# Patient Record
Sex: Male | Born: 1955
Health system: Southern US, Community
[De-identification: ages and names within clinical notes are randomized; demographics above are authoritative.]

## PROBLEM LIST (undated history)

## (undated) DIAGNOSIS — R7303 Prediabetes: Secondary | ICD-10-CM

## (undated) DIAGNOSIS — H4911 Fourth [trochlear] nerve palsy, right eye: Secondary | ICD-10-CM

## (undated) DIAGNOSIS — E7800 Pure hypercholesterolemia, unspecified: Secondary | ICD-10-CM

## (undated) DIAGNOSIS — E785 Hyperlipidemia, unspecified: Secondary | ICD-10-CM

## (undated) DIAGNOSIS — I6529 Occlusion and stenosis of unspecified carotid artery: Secondary | ICD-10-CM

## (undated) DIAGNOSIS — I639 Cerebral infarction, unspecified: Secondary | ICD-10-CM

## (undated) DIAGNOSIS — I10 Essential (primary) hypertension: Secondary | ICD-10-CM

## (undated) DIAGNOSIS — I2510 Atherosclerotic heart disease of native coronary artery without angina pectoris: Secondary | ICD-10-CM

## (undated) HISTORY — DX: Occlusion and stenosis of unspecified carotid artery: I65.29

## (undated) HISTORY — DX: Atherosclerotic heart disease of native coronary artery without angina pectoris: I25.10

## (undated) HISTORY — PX: CORONARY ARTERY BYPASS GRAFT: SHX141

## (undated) HISTORY — PX: COLONOSCOPY W/ POLYPECTOMY: SHX1380

## (undated) HISTORY — DX: Essential (primary) hypertension: I10

## (undated) HISTORY — DX: Fourth (trochlear) nerve palsy, right eye: H49.11

## (undated) HISTORY — DX: Hyperlipidemia, unspecified: E78.5

## (undated) HISTORY — PX: CAROTID ENDARTERECTOMY: SUR193

## (undated) HISTORY — PX: UPPER GASTROINTESTINAL ENDOSCOPY: SHX188

## (undated) HISTORY — PX: OTHER SURGICAL HISTORY: SHX169

## (undated) HISTORY — PX: CARDIAC CATHETERIZATION: SHX172

---

## 1998-11-03 ENCOUNTER — Encounter: Payer: Self-pay | Admitting: Cardiovascular Disease

## 1998-11-07 ENCOUNTER — Encounter: Payer: Self-pay | Admitting: Cardiovascular Disease

## 1998-11-16 ENCOUNTER — Encounter: Payer: Self-pay | Admitting: Cardiovascular Disease

## 2004-12-03 ENCOUNTER — Other Ambulatory Visit: Payer: Self-pay

## 2004-12-03 ENCOUNTER — Emergency Department: Payer: Self-pay | Admitting: Emergency Medicine

## 2005-09-25 ENCOUNTER — Ambulatory Visit: Payer: Self-pay | Admitting: Internal Medicine

## 2005-12-10 ENCOUNTER — Encounter: Payer: Self-pay | Admitting: Orthopedic Surgery

## 2005-12-16 ENCOUNTER — Encounter: Payer: Self-pay | Admitting: Orthopedic Surgery

## 2006-01-16 ENCOUNTER — Encounter: Payer: Self-pay | Admitting: Orthopedic Surgery

## 2006-02-16 ENCOUNTER — Encounter: Payer: Self-pay | Admitting: Orthopedic Surgery

## 2007-02-02 ENCOUNTER — Emergency Department: Payer: Self-pay | Admitting: Emergency Medicine

## 2007-07-24 ENCOUNTER — Ambulatory Visit: Payer: Self-pay | Admitting: Family Medicine

## 2007-09-23 ENCOUNTER — Ambulatory Visit: Payer: Self-pay | Admitting: Family Medicine

## 2008-01-16 ENCOUNTER — Ambulatory Visit: Payer: Self-pay | Admitting: Family Medicine

## 2008-01-26 ENCOUNTER — Ambulatory Visit: Payer: Self-pay | Admitting: Internal Medicine

## 2008-01-26 ENCOUNTER — Ambulatory Visit: Payer: Self-pay | Admitting: *Deleted

## 2008-02-11 ENCOUNTER — Ambulatory Visit: Payer: Self-pay | Admitting: Family Medicine

## 2008-03-09 ENCOUNTER — Ambulatory Visit: Payer: Self-pay | Admitting: Family Medicine

## 2008-03-16 ENCOUNTER — Ambulatory Visit: Payer: Self-pay | Admitting: Family Medicine

## 2008-07-06 ENCOUNTER — Ambulatory Visit: Payer: Self-pay | Admitting: Family Medicine

## 2008-10-05 ENCOUNTER — Ambulatory Visit: Payer: Self-pay | Admitting: Family Medicine

## 2008-11-10 ENCOUNTER — Ambulatory Visit: Payer: Self-pay | Admitting: Family Medicine

## 2008-11-13 ENCOUNTER — Emergency Department (HOSPITAL_COMMUNITY): Admission: EM | Admit: 2008-11-13 | Discharge: 2008-11-13 | Payer: Self-pay | Admitting: Emergency Medicine

## 2008-12-08 ENCOUNTER — Ambulatory Visit: Payer: Self-pay | Admitting: Internal Medicine

## 2008-12-12 ENCOUNTER — Emergency Department (HOSPITAL_COMMUNITY): Admission: EM | Admit: 2008-12-12 | Discharge: 2008-12-12 | Payer: Self-pay | Admitting: Emergency Medicine

## 2009-01-13 ENCOUNTER — Ambulatory Visit: Payer: Self-pay | Admitting: Internal Medicine

## 2009-03-22 ENCOUNTER — Ambulatory Visit: Payer: Self-pay | Admitting: Internal Medicine

## 2009-03-22 LAB — CONVERTED CEMR LAB
Cholesterol: 177 mg/dL (ref 0–200)
Total CHOL/HDL Ratio: 3.6
Triglycerides: 140 mg/dL (ref <150)

## 2009-04-21 ENCOUNTER — Ambulatory Visit: Payer: Self-pay | Admitting: Internal Medicine

## 2009-05-19 ENCOUNTER — Ambulatory Visit: Payer: Self-pay | Admitting: Internal Medicine

## 2009-06-07 ENCOUNTER — Ambulatory Visit: Payer: Self-pay | Admitting: Internal Medicine

## 2009-06-07 LAB — CONVERTED CEMR LAB: Microalb, Ur: 0.5 mg/dL (ref 0.00–1.89)

## 2009-06-13 ENCOUNTER — Encounter: Payer: Self-pay | Admitting: Cardiovascular Disease

## 2009-06-30 DIAGNOSIS — R079 Chest pain, unspecified: Secondary | ICD-10-CM | POA: Insufficient documentation

## 2009-07-12 ENCOUNTER — Ambulatory Visit: Payer: Self-pay | Admitting: Cardiovascular Disease

## 2009-07-12 DIAGNOSIS — I25810 Atherosclerosis of coronary artery bypass graft(s) without angina pectoris: Secondary | ICD-10-CM

## 2009-07-12 DIAGNOSIS — E785 Hyperlipidemia, unspecified: Secondary | ICD-10-CM | POA: Insufficient documentation

## 2009-07-12 DIAGNOSIS — I10 Essential (primary) hypertension: Secondary | ICD-10-CM

## 2009-07-12 DIAGNOSIS — I2510 Atherosclerotic heart disease of native coronary artery without angina pectoris: Secondary | ICD-10-CM | POA: Insufficient documentation

## 2009-07-12 DIAGNOSIS — I25118 Atherosclerotic heart disease of native coronary artery with other forms of angina pectoris: Secondary | ICD-10-CM | POA: Insufficient documentation

## 2009-07-13 ENCOUNTER — Telehealth (INDEPENDENT_AMBULATORY_CARE_PROVIDER_SITE_OTHER): Payer: Self-pay | Admitting: *Deleted

## 2009-07-15 ENCOUNTER — Telehealth (INDEPENDENT_AMBULATORY_CARE_PROVIDER_SITE_OTHER): Payer: Self-pay | Admitting: *Deleted

## 2009-07-21 ENCOUNTER — Ambulatory Visit: Payer: Self-pay | Admitting: Cardiovascular Disease

## 2009-07-21 ENCOUNTER — Inpatient Hospital Stay (HOSPITAL_BASED_OUTPATIENT_CLINIC_OR_DEPARTMENT_OTHER): Admission: RE | Admit: 2009-07-21 | Discharge: 2009-07-21 | Payer: Self-pay | Admitting: Cardiovascular Disease

## 2009-09-02 ENCOUNTER — Ambulatory Visit: Payer: Self-pay | Admitting: Cardiovascular Disease

## 2009-09-06 ENCOUNTER — Telehealth: Payer: Self-pay | Admitting: Cardiovascular Disease

## 2009-09-20 ENCOUNTER — Telehealth: Payer: Self-pay | Admitting: Cardiovascular Disease

## 2009-11-17 ENCOUNTER — Telehealth (INDEPENDENT_AMBULATORY_CARE_PROVIDER_SITE_OTHER): Payer: Self-pay | Admitting: *Deleted

## 2009-11-17 ENCOUNTER — Ambulatory Visit: Payer: Self-pay | Admitting: Family Medicine

## 2010-03-16 ENCOUNTER — Emergency Department (HOSPITAL_COMMUNITY): Admission: EM | Admit: 2010-03-16 | Discharge: 2010-03-16 | Payer: Self-pay | Admitting: Emergency Medicine

## 2010-03-16 ENCOUNTER — Emergency Department: Payer: Self-pay | Admitting: Emergency Medicine

## 2010-03-17 ENCOUNTER — Telehealth: Payer: Self-pay | Admitting: Cardiovascular Disease

## 2010-03-20 ENCOUNTER — Ambulatory Visit (HOSPITAL_COMMUNITY): Admission: RE | Admit: 2010-03-20 | Discharge: 2010-03-20 | Payer: Self-pay | Admitting: Family Medicine

## 2010-03-20 ENCOUNTER — Telehealth: Payer: Self-pay | Admitting: Cardiovascular Disease

## 2010-03-21 ENCOUNTER — Ambulatory Visit: Payer: Self-pay | Admitting: Cardiovascular Disease

## 2010-04-10 ENCOUNTER — Telehealth (INDEPENDENT_AMBULATORY_CARE_PROVIDER_SITE_OTHER): Payer: Self-pay | Admitting: *Deleted

## 2010-04-11 ENCOUNTER — Ambulatory Visit (HOSPITAL_COMMUNITY): Admission: RE | Admit: 2010-04-11 | Discharge: 2010-04-11 | Payer: Self-pay | Admitting: Cardiovascular Disease

## 2010-04-11 ENCOUNTER — Encounter: Payer: Self-pay | Admitting: Cardiovascular Disease

## 2010-04-11 ENCOUNTER — Ambulatory Visit: Payer: Self-pay

## 2010-04-11 ENCOUNTER — Ambulatory Visit: Payer: Self-pay | Admitting: Internal Medicine

## 2010-04-11 ENCOUNTER — Ambulatory Visit: Payer: Self-pay | Admitting: Cardiovascular Disease

## 2010-04-18 ENCOUNTER — Ambulatory Visit: Payer: Self-pay | Admitting: Cardiovascular Disease

## 2010-06-05 ENCOUNTER — Encounter (INDEPENDENT_AMBULATORY_CARE_PROVIDER_SITE_OTHER): Payer: Self-pay | Admitting: Family Medicine

## 2010-06-05 LAB — CONVERTED CEMR LAB
BUN: 11 mg/dL (ref 6–23)
CO2: 28 meq/L (ref 19–32)
Calcium: 10.1 mg/dL (ref 8.4–10.5)
Chloride: 103 meq/L (ref 96–112)
Cholesterol: 164 mg/dL (ref 0–200)
Creatinine, Ser: 0.83 mg/dL (ref 0.40–1.50)
HDL: 45 mg/dL (ref >39)
Total CHOL/HDL Ratio: 3.6

## 2010-07-11 ENCOUNTER — Ambulatory Visit (HOSPITAL_COMMUNITY)
Admission: RE | Admit: 2010-07-11 | Discharge: 2010-07-11 | Payer: Self-pay | Source: Home / Self Care | Attending: Family Medicine | Admitting: Family Medicine

## 2010-07-16 LAB — CONVERTED CEMR LAB
Basophils Relative: 1.4 % (ref 0.0–3.0)
CO2: 31 meq/L (ref 19–32)
Chloride: 104 meq/L (ref 96–112)
Creatinine, Ser: 0.8 mg/dL (ref 0.4–1.5)
Eosinophils Absolute: 0.3 10*3/uL (ref 0.0–0.7)
Hemoglobin: 15 g/dL (ref 13.0–17.0)
MCHC: 32.8 g/dL (ref 30.0–36.0)
MCV: 97 fL (ref 78.0–100.0)
Monocytes Absolute: 0.5 10*3/uL (ref 0.1–1.0)
Neutro Abs: 2.7 10*3/uL (ref 1.4–7.7)
RBC: 4.71 M/uL (ref 4.22–5.81)
Sodium: 140 meq/L (ref 135–145)

## 2010-07-18 NOTE — Assessment & Plan Note (Signed)
Summary: [name] Sosa/chest pain/jss   Visit Type:  new pt visit Primary Provider:  Dr. Reche Dixon  CC:  pt states he is still have some angina....denies any sob or edema.  History of Present Illness: 55 yo male with h/o CAD s/p 4V CABG in 2000 at [facility], HTN, hyperlipidemia here today for further evaluation of chest pain. He has been followed by Dr. Juliann Pares in [place] and has had yearly stress tests. He recently lost his job and insurance and Dr. Glennis Brink office did not allow him to come in for a return visit.  His last stress test was in February of 2010 in [place] and he was told that it was ok. He tells me that he has been having muscle spasms in his left chest wall. It is reproducible.  He can massage it and it feels better. He also has another left sided chest pain across the precordium. This is described as a pressure. NO associated SOB, nausea, diaphoresis, dizziness. This has been occuring almost daily over the last few weeks. He is concerned that his CAD may have progressed and his stress tests have never shown any problems.   Current Medications (verified): 1)  Crestor 10 Mg Tabs (Rosuvastatin Calcium) .Marland Kitchen.. 1 Tab Once Daily 2)  Aspirin 81 Mg Tbec (Aspirin) .... Take One Tablet By Mouth Daily 3)  Lisinopril 10 Mg Tabs (Lisinopril) .Marland Kitchen.. 1 Tab Once Daily 4)  Nitrostat 0.4 Mg Subl (Nitroglycerin) .Marland Kitchen.. 1 Tablet Under Tongue At Onset of Chest Pain; You May Repeat Every 5 Minutes For Up To 3 Doses.  Allergies (verified): No Known Drug Allergies  Past History:  Past Medical History: CAD s/p 4V CABG 2000 at [facility] HTN Hyperlipidemia  Past Surgical History: 4V CABG 2000  Family History: Reviewed history and no changes required. Mother-deceased, heart failure Father-deceased, heart failure 2 sisters and 3 brothers. Older brother and sister with cardiac issues  Social History: Reviewed history and no changes required. Former tobacco abuse,  none since 1999 No alcohol No illicit drug [place] Single 3 children Unemployed  Review of Systems       The patient complains of chest pain.  The patient denies fatigue, malaise, fever, weight gain/loss, vision loss, decreased hearing, hoarseness, palpitations, shortness of breath, prolonged cough, wheezing, sleep apnea, coughing up blood, abdominal pain, blood in stool, nausea, vomiting, diarrhea, heartburn, incontinence, blood in urine, muscle weakness, joint pain, leg swelling, rash, skin lesions, headache, fainting, dizziness, depression, anxiety, enlarged lymph nodes, easy bruising or bleeding, and environmental allergies.    Vital Signs:  Patient profile:   55 year old male Height:      68 inches Weight:      124 pounds BMI:     18.92 Pulse rate:   62 / minute Pulse rhythm:   irregular BP sitting:   120 / 80  (left arm) Cuff size:   regular  Vitals Entered By: Danielle Rankin, CMA (July 12, 2009 10:26 AM)  Physical Exam  General:  General: Well developed, well nourished, NAD HEENT: OP clear, mucus membranes moist SKIN: warm, dry Neuro: No focal deficits Musculoskeletal: Muscle strength 5/5 all ext Psychiatric: Mood and affect normal Neck: No JVD, no carotid bruits, no thyromegaly, no lymphadenopathy. Lungs:Clear bilaterally, no wheezes, rhonci, crackles CV: RRR no murmurs, gallops rubs Abdomen: soft, NT, ND, BS present Extremities: No edema, pulses 2+.    Problems:  Medical Problems Added: 1)  Dx of Hyperlipidemia-mixed  (ICD-272.4) 2)  Dx of Hyperlipidemia-mixed  (  ICD-272.4) 3)  Dx of Hypertension, Benign  (ICD-401.1) 4)  Dx of Cad, Artery Bypass Graft  (ICD-414.04)  EKG  Procedure date:  07/12/2009  Findings:      NSR, rate 62 bpm. Old inferior infarct. Poor R wave progression through precordial leads.   Impression & Recommendations:  Problem # 1:  CAD, ARTERY BYPASS GRAFT (ICD-414.04)  Recent progression of chest pain which could represent angina. We  have discussed a repeat stress test but he would like to proceed to diagnostic left heart cath to evaluate his grafts and native vessels to better define coronary flow. I think this iis reasonable. We will arrange this in the [facility]. Labs today to include BMET, CBC, coags.  Continue current medications. Not on a beta blocker. Will add after cath.   His updated medication list for this problem includes:    Aspirin 81 Mg Tbec (Aspirin) .Marland Kitchen... Take one tablet by mouth daily    Lisinopril 10 Mg Tabs (Lisinopril) .Marland Kitchen... 1 tab once daily    Nitrostat 0.4 Mg Subl (Nitroglycerin) .Marland Kitchen... 1 tablet under tongue at onset of chest pain; you may repeat every 5 minutes for up to 3 doses.  Orders: Cardiac Catheterization (Cardiac Cath)  Problem # 2:  CHEST PAIN-UNSPECIFIED (ICD-786.50)  See above.  His updated medication list for this problem includes:    Aspirin 81 Mg Tbec (Aspirin) .Marland Kitchen... Take one tablet by mouth daily    Lisinopril 10 Mg Tabs (Lisinopril) .Marland Kitchen... 1 tab once daily    Nitrostat 0.4 Mg Subl (Nitroglycerin) .Marland Kitchen... 1 tablet under tongue at onset of chest pain; you may repeat every 5 minutes for up to 3 doses.  Orders: EKG w/ Interpretation (93000) TLB-BMP (Basic Metabolic Panel-BMET) (80048-METABOL) TLB-CBC Platelet - w/Differential (85025-CBCD) TLB-PT (Protime) (85610-PTP) TLB-PTT (85730-PTTL) Cardiac Catheterization (Cardiac Cath)  Problem # 3:  HYPERTENSION, BENIGN (ICD-401.1)  Controlled on current therapy.   His updated medication list for this problem includes:    Aspirin 81 Mg Tbec (Aspirin) .Marland Kitchen... Take one tablet by mouth daily    Lisinopril 10 Mg Tabs (Lisinopril) .Marland Kitchen... 1 tab once daily  His updated medication list for this problem includes:    Aspirin 81 Mg Tbec (Aspirin) .Marland Kitchen... Take one tablet by mouth daily    Lisinopril 10 Mg Tabs (Lisinopril) .Marland Kitchen... 1 tab once daily  Problem # 4:  HYPERLIPIDEMIA-MIXED (ICD-272.4)  Continue statin. Will need fasting lipids at follow up.    His updated medication list for this problem includes:    Crestor 10 Mg Tabs (Rosuvastatin calcium) .Marland Kitchen... 1 tab once daily  His updated medication list for this problem includes:    Crestor 10 Mg Tabs (Rosuvastatin calcium) .Marland Kitchen... 1 tab once daily  Patient Instructions: 1)  Your physician recommends that you schedule a follow-up appointment in: 3-4 weeks 2)  Your physician has requested that you have a cardiac catheterization.  Cardiac catheterization is used to diagnose and/or treat various heart conditions. Doctors may recommend this procedure for a number of different reasons. The most common reason is to evaluate chest pain. Chest pain can be a symptom of coronary artery disease (CAD), and cardiac catheterization can show whether plaque is narrowing or blocking your heart's arteries. This procedure is also used to evaluate the valves, as well as measure the blood flow and oxygen levels in different parts of your heart.  For further information please visit [email or web address]  Please follow instruction sheet, as given.

## 2010-07-18 NOTE — Progress Notes (Signed)
     Additional Follow-up for Phone Call Additional follow up Details #1::       Additional Follow-up by: Denny Peon    Spoke with [facility] @ 8:30 am to get records, had to call back @ 10:45 spoke with Dennie Bible in [place] to get the Cath that was done 10/28/98 that was not sent the first time. She will have to look for it and call me back. She said one was not done.Will let Katina Dung know.  Cala Bradford Mesiemore  July 15, 2009 10:57 AM

## 2010-07-18 NOTE — Consult Note (Signed)
Summary: [facility] Referral Form  [facility] Referral Form   Imported By: Roderic Ovens 07/14/2009 16:31:04  _____________________________________________________________________  External Attachment:    Type:   Image     Comment:   External Document

## 2010-07-18 NOTE — Progress Notes (Signed)
  Phone Note Call from Patient   Caller: Pt Initial call taken by: KM    Pt called Asking for Copy of Cath Report, pt Advised to sign ROI he agreed. [name] Sosa  November 17, 2009 9:26 AM  Appended Document:  Pt picked up cath report

## 2010-07-18 NOTE — Progress Notes (Signed)
Summary: test done at [facility] over the weekend  Phone Note Call from Patient Call back at Home Phone [phone]   Caller: Patient Reason for Call: Talk to Nurse Summary of Call: pt went to er over the weekend @  [facility] [phone]. pt was advise from Uncertain thay we can call and get his records., pt had test done over the weekend.  Initial call taken by: Lorne Skeens,  March 20, 2010 1:12 PM  Follow-up for Phone Call        I spoke with pt who was in the ED at [facility] this past weekend with b/p issues.  Records will be faxed to [place] today from [facility]. Mylo Red RN

## 2010-07-18 NOTE — Letter (Signed)
Summary: Discharge Summary - [facility]  Discharge Summary - [facility]   Imported By: Marylou Mccoy 07/20/2009 15:22:47  _____________________________________________________________________  External Attachment:    Type:   Image     Comment:   External Document

## 2010-07-18 NOTE — Op Note (Signed)
Summary: Operative Report - [facility]  Operative Report - [facility]   Imported By: Marylou Mccoy 07/20/2009 15:29:38  _____________________________________________________________________  External Attachment:    Type:   Image     Comment:   External Document

## 2010-07-18 NOTE — Consult Note (Signed)
Summary: Clinic Note - [facility]  Clinic Note - [facility]   Imported By: Marylou Mccoy 07/20/2009 15:28:44  _____________________________________________________________________  External Attachment:    Type:   Image     Comment:   External Document

## 2010-07-18 NOTE — Progress Notes (Signed)
Summary: rx lisinopril  Phone Note Refill Request Call back at Home Phone [phone] Message from:  Patient on September 20, 2009 11:23 AM  Refills Requested: Medication #1:  LISINOPRIL 10 MG TABS 1 tab once daily SEND TO [facility] [phone] AND  REQUEST CALL BACK FROM NURSE HAVE QUESTIONS.  Initial call taken by: Judie Grieve,  September 20, 2009 11:24 AM    Prescriptions: LISINOPRIL 10 MG TABS (LISINOPRIL) 1 tab once daily  #30 x 11   Entered by:   Danielle Rankin, CMA   Authorized by:   Verne Carrow, MD   Signed by:   Danielle Rankin, CMA on 09/20/2009   Method used:   Electronically to        [facility] [address] (retail)       [address], [address]       [place], [place]  [address]       Ph: [phone]       Fax: [phone]   RxID:   [id number]

## 2010-07-18 NOTE — Assessment & Plan Note (Signed)
Summary: S/P CATH/2-3/SAF   Visit Type:  Follow-up Primary Provider:  Dr. Reche Dixon  CC:  no  cardiac complaints today.  History of Present Illness: 55 yo male with h/o CAD s/p 7V CABG in 2000 at [facility], HTN, hyperlipidemia here today for follow up. He was seen as a new pt in January 2011 with complaints of chest pain. He has been followed by Dr. Juliann Pares in [place] and has had yearly stress tests. He  lost his job and insurance and Dr. Glennis Brink office did not allow him to come in for a return visit.  I arranged a cath which was performed on 07/21/09. This showed 5/7 patent bypass grafts but good flow into both diagonals (the targets of the occluded grafts). I elected for medical management. He is here today for follow up. He has been doing well. He has occasional chest pains. He has not taken NTG. He does describe fatigue since I started the Coreg.   Current Medications (verified): 1)  Crestor 10 Mg Tabs (Rosuvastatin Calcium) .Marland Kitchen.. 1 Tab Once Daily 2)  Aspirin 81 Mg Tbec (Aspirin) .... Take One Tablet By Mouth Daily 3)  Lisinopril 10 Mg Tabs (Lisinopril) .Marland Kitchen.. 1 Tab Once Daily 4)  Nitrostat 0.4 Mg Subl (Nitroglycerin) .Marland Kitchen.. 1 Tablet Under Tongue At Onset of Chest Pain; You May Repeat Every 5 Minutes For Up To 3 Doses. 5)  Carvedilol 3.125 Mg Tabs (Carvedilol) .Marland Kitchen.. 1 Tab Two Times A Day  Allergies (verified): No Known Drug Allergies  Past History:  Past Medical History: CAD s/p 7 V CABG 2000 at [facility] with cath 2/11, 5/7 patent grafts, EF 40%. HTN Hyperlipidemia  Social History: Reviewed history from 07/12/2009 and no changes required. Former tobacco abuse, none since 1999 No alcohol No illicit drug [place] Single 3 children Unemployed  Review of Systems       The patient complains of chest pain and fatigue.  The patient denies malaise, fever, weight gain/loss, vision loss, decreased hearing, hoarseness, palpitations, shortness of breath,  prolonged cough, wheezing, sleep apnea, coughing up blood, abdominal pain, blood in stool, nausea, vomiting, diarrhea, heartburn, incontinence, blood in urine, muscle weakness, joint pain, leg swelling, rash, skin lesions, headache, fainting, dizziness, depression, anxiety, enlarged lymph nodes, easy bruising or bleeding, and environmental allergies.    Vital Signs:  Patient profile:   55 year old male Height:      68 inches Weight:      126 pounds BMI:     19.23 Pulse rate:   67 / minute Pulse rhythm:   irregular BP sitting:   120 / 70  (left arm) Cuff size:   regular  Vitals Entered By: Danielle Rankin, CMA (September 02, 2009 3:45 PM)  Physical Exam  General:  General: Well developed, well nourished, NAD Musculoskeletal: Muscle strength 5/5 all ext Psychiatric: Mood and affect normal Neck: No JVD, no carotid bruits, no thyromegaly, no lymphadenopathy. Lungs:Clear bilaterally, no wheezes, rhonci, crackles CV: RRR no murmurs, gallops rubs Abdomen: soft, NT, ND, BS present Extremities: No edema, pulses 2+.    Cardiac Cath  Procedure date:  07/21/2009  Findings:      1. Left main coronary artery had diffuse 50-60% stenosis throughout. 2. The left anterior descending appeared to have diffuse 70% stenosis     throughout the proximal portion and culminated in a 99% mid     stenosis with competitive filling from the graft.  The mid and     distal LAD filled  from the left internal mammary artery graft.     First diagonal was a small-caliber vessel that was patent.  There     was evidence of where the prior vein graft tied into this vessel.     There did not appear to be any flow-limiting lesions in this     vessel.  The second diagonal branch was patent and was a small-to-     moderate-sized vessel.  I could also see where the vein graft had     tied into this vessel.  There were no flow-limiting lesions in this     vessel. 3. The circumflex artery had a 100% proximal occlusion.  The  proximal     and mid AV groove circ as well as all 3 marginal branches filled     from the free right internal mammary artery graft. 4. The native right coronary artery was severely diseased with a 99%     proximal stenosis followed by 100% mid stenosis.  The distal vessel     as well as the posterior descending artery filled from the     saphenous vein graft. 5. Left internal mammary artery to the mid LAD was patent. 6. The free right internal mammary artery graft to the obtuse marginal     was patent.  Once again, this filled the proximal and mid AV groove     circumflex as well as obtuse marginal branches 1, 2, and 3. 7. Saphenous vein graft to the PDA was patent.  This was a small-     caliber graft with diffuse 30% stenosis throughout. 8. The saphenous vein graft to the first diagonal was occluded at the     aortic anastomosis. 9. The saphenous vein graft to the second diagonal was occluded at the     aortic anastomosis. 10.The right subclavian artery was injected and showed a 30% stenosis. 11.LV angio showed anteroapical severe hypokinesis as well as mild hypokinesis     of the inferior wall.  EF=35-40%  EKG  Procedure date:  07/21/2009  Findings:      NSR, rate 67 bpm. Possible inferior infarct,old. Poor R wave progression.   Impression & Recommendations:  Problem # 1:  CAD, ARTERY BYPASS GRAFT (ICD-414.04) Stable. Continue current meds. He will hold his Coreg and see if his energy level increases. If it does, I told him to stop the Coreg completely. If this has no effect, he should resume the Coreg. Continue ASA, statin and Ace-inh.  His updated medication list for this problem includes:    Aspirin 81 Mg Tbec (Aspirin) .Marland Kitchen... Take one tablet by mouth daily    Lisinopril 10 Mg Tabs (Lisinopril) .Marland Kitchen... 1 tab once daily    Nitrostat 0.4 Mg Subl (Nitroglycerin) .Marland Kitchen... 1 tablet under tongue at onset of chest pain; you may repeat every 5 minutes for up to 3 doses.    Carvedilol  3.125 Mg Tabs (Carvedilol) .Marland Kitchen... 1 tab two times a day  Problem # 2:  HYPERTENSION, BENIGN (ICD-401.1) Well controlled.   His updated medication list for this problem includes:    Aspirin 81 Mg Tbec (Aspirin) .Marland Kitchen... Take one tablet by mouth daily    Lisinopril 10 Mg Tabs (Lisinopril) .Marland Kitchen... 1 tab once daily    Carvedilol 3.125 Mg Tabs (Carvedilol) .Marland Kitchen... 1 tab two times a day  Other Orders: EKG w/ Interpretation (93000)  Patient Instructions: 1)  Your physician recommends that you schedule a follow-up appointment in: 6 months 2)  Your  physician recommends that you continue on your current medications as directed. Please refer to the Current Medication list given to you today. Prescriptions: CRESTOR 10 MG TABS (ROSUVASTATIN CALCIUM) 1 tab once daily  #30 x 9   Entered by:   Dossie Arbour, RN, BSN   Authorized by:   Verne Carrow, MD   Signed by:   Dossie Arbour, RN, BSN on 09/02/2009   Method used:   Electronically to        [facility] [address] (retail)       [address], [address]       [place], [place]  [address]       Ph: [phone]       Fax: [phone]   RxID:   [id number]

## 2010-07-18 NOTE — Progress Notes (Signed)
  Phone Note Other Incoming   Caller: [facility]  Summary of Call: Recieved phone call from [place] in  [place] lab , advises they do not have  the previous cath note  and cabg note from [facility]  in 2000.    We do not have a regular chart  and those notes were not preloaded.   Will have to have the patient sign a release to get info..  Left pt voicemail to call and have him come by and sign release.        Pt came by and signed release... faxed to  [facility].   When records recieved  will need to fax to [facility].    Initial call taken by: Letta Moynahan, EMT,  July 13, 2009 11:06 AM  Follow-up for Phone Call        Pt called back  will come by today to sign release form   so it can be faxed.    Follow-up by: Letta Moynahan, EMT,  July 13, 2009 3:55 PM  Additional Follow-up for Phone Call Additional follow up Details #1::        release form faxed per Salomon Ganser Additional Follow-up by: Scherrie Bateman, LPN,  July 14, 2009 8:09 AM     Appended Document:  Cath was not done at [facility] so report is unavailable.  Dr. [name] aware. I called Mark in [facility] and let him know this.  Discharge summary,clinic note and op report faxed to [facility].

## 2010-07-18 NOTE — Letter (Signed)
Summary: Cardiac Catheterization Instructions- [facility]  [facility], Main Office  [address]   [place], [place] [address]   Phone: [phone]  Fax: [phone]     07/12/2009 MRN: [id number]  [facility] [address] [place], [place]  [address]  Dear Mr. Freeland,   You are scheduled for a Cardiac Catheterization on __February 3, 2011_______ with Dr._________McAlhany_____  Please arrive to the 1st floor of the Heart and Vascular Center at [facility] at ___7:30__ am  on the day of your procedure. Please do not arrive before 6:30 a.m. Call the Heart and Vascular Center at [phone] if you are unable to make your appointmnet. The Code to get into the parking garage under the building is____0900____. Take the elevators to the 1st floor. You must have someone to drive you home. Someone must be with you for the first 24 hours after you arrive home. Please wear clothes that are easy to get on and off and wear slip-on shoes. Do not eat or drink after midnight except water with your medications that morning. Bring all your medications and current insurance cards with you.  ___ DO NOT take these medications before your procedure: ________________________________________________________________  ___ Make sure you take your aspirin.  __x_ You may take ALL of your medications with water that morning. ________________________________________________________________________________________________________________________________  ___ DO NOT take ANY medications before your procedure.  ___ Pre-med instructions:  ________________________________________________________________________________________________________________________________  The usual length of stay after your procedure is 2 to 3 hours. This can vary.  If you have any questions, please call the office at the number listed above.   Dossie Arbour, RN, BSN

## 2010-07-18 NOTE — Assessment & Plan Note (Signed)
Summary: high bp.headache.mt   Visit Type:  rov Primary [name] Sosa:  Dr. Reche Dixon  CC:  elevated BP....headaches....  History of Present Illness: 55 yo male with h/o CAD s/p 7V CABG in 2000 at [facility], HTN, hyperlipidemia here today for follow up. He was seen as a new pt in January 2011 with complaints of chest pain. He has been followed by Dr. Juliann Pares in [place] and has had yearly stress tests. He  lost his job and insurance and Dr. Glennis Brink office did not allow him to come in for a return visit.  I arranged a cath which was performed on 07/21/09. This showed 5/7 patent bypass grafts but good flow into both diagonals (the targets of the occluded grafts). I elected for medical management. He is here today for follow up.   He was seen in the [facility] ED four days ago with headache, BP was elevated.  He was treated for his headache. Head CT was negative. He was sent home.  He also describes  almost daily chest pressure, upper left chest. No associated SOB, dizziness, near syncope or syncope. The chest pressure improves with NTG. Most of the time it only lasts for several minutes.   Current Medications (verified): 1)  Crestor 10 Mg Tabs (Rosuvastatin Calcium) .Marland Kitchen.. 1 Tab Once Daily 2)  Aspirin 81 Mg Tbec (Aspirin) .... Take One Tablet By Mouth Daily 3)  Lisinopril 10 Mg Tabs (Lisinopril) .Marland Kitchen.. 1 Tab Once Daily 4)  Nitrostat 0.4 Mg Subl (Nitroglycerin) .Marland Kitchen.. 1 Tablet Under Tongue At Onset of Chest Pain; You May Repeat Every 5 Minutes For Up To 3 Doses. 5)  Carvedilol 3.125 Mg Tabs (Carvedilol) .Marland Kitchen.. 1 Tab Two Times A Day 6)  Promethazine Hcl 25 Mg Tabs (Promethazine Hcl) .... As Needed  Allergies (verified): No Known Drug Allergies  Past History:  Past Medical History: Reviewed history from 09/02/2009 and no changes required. CAD s/p 7 V CABG 2000 at [facility] with cath 2/11, 5/7 patent grafts, EF 40%. HTN Hyperlipidemia  Past Surgical History: Reviewed  history from 07/12/2009 and no changes required. 4V CABG 2000  Family History: Reviewed history from 07/12/2009 and no changes required. Mother-deceased, heart failure Father-deceased, heart failure 2 sisters and 3 brothers. Older brother and sister with cardiac issues  Social History: Reviewed history from 07/12/2009 and no changes required. Former tobacco abuse, none since 1999 No alcohol No illicit drug [place] Single 3 children Unemployed  Review of Systems       The patient complains of chest pain and headache.  The patient denies fatigue, malaise, fever, weight gain/loss, vision loss, decreased hearing, hoarseness, palpitations, shortness of breath, prolonged cough, wheezing, sleep apnea, coughing up blood, abdominal pain, blood in stool, nausea, vomiting, diarrhea, heartburn, incontinence, blood in urine, muscle weakness, joint pain, leg swelling, rash, skin lesions, fainting, dizziness, depression, anxiety, enlarged lymph nodes, easy bruising or bleeding, and environmental allergies.    Vital Signs:  Patient profile:   55 year old male Height:      68 inches Weight:      128.8 pounds BMI:     19.65 Pulse rate:   62 / minute Pulse rhythm:   irregular BP sitting:   138 / 78  (left arm) Cuff size:   large  Vitals Entered By: Danielle Rankin, CMA (March 21, 2010 2:17 PM)  Physical Exam  General:  General: Well developed, well nourished, NAD HEENT: OP clear, mucus membranes moist SKIN: warm, dry Neuro: No focal  deficits Musculoskeletal: Muscle strength 5/5 all ext Psychiatric: Mood and affect normal Neck: No JVD, no carotid bruits, no thyromegaly, no lymphadenopathy. Lungs:Clear bilaterally, no wheezes, rhonci, crackles CV: RRR no murmurs, gallops rubs Abdomen: soft, NT, ND, BS present Extremities: No edema, pulses 2+.    Impression & Recommendations:  Problem # 1:  CAD, ARTERY BYPASS GRAFT (ICD-414.04) Recent increase in angina. Will order stress echo. Cath February  2011 with 5/7 patent grafts, good flow to diagonals.   His updated medication list for this problem includes:    Aspirin 81 Mg Tbec (Aspirin) .Marland Kitchen... Take one tablet by mouth daily    Lisinopril 20 Mg Tabs (Lisinopril) .Marland Kitchen... Take one tablet by mouth daily    Nitrostat 0.4 Mg Subl (Nitroglycerin) .Marland Kitchen... 1 tablet under tongue at onset of chest pain; you may repeat every 5 minutes for up to 3 doses.    Carvedilol 3.125 Mg Tabs (Carvedilol) .Marland Kitchen... 1 tab two times a day  His updated medication list for this problem includes:    Aspirin 81 Mg Tbec (Aspirin) .Marland Kitchen... Take one tablet by mouth daily    Lisinopril 10 Mg Tabs (Lisinopril) .Marland Kitchen... 1 tab once daily    Nitrostat 0.4 Mg Subl (Nitroglycerin) .Marland Kitchen... 1 tablet under tongue at onset of chest pain; you may repeat every 5 minutes for up to 3 doses.    Carvedilol 3.125 Mg Tabs (Carvedilol) .Marland Kitchen... 1 tab two times a day  Orders: Stress Echo (Stress Echo)  Problem # 2:  CHEST PAIN-UNSPECIFIED (ICD-786.50) See above.   His updated medication list for this problem includes:    Aspirin 81 Mg Tbec (Aspirin) .Marland Kitchen... Take one tablet by mouth daily    Lisinopril 20 Mg Tabs (Lisinopril) .Marland Kitchen... Take one tablet by mouth daily    Nitrostat 0.4 Mg Subl (Nitroglycerin) .Marland Kitchen... 1 tablet under tongue at onset of chest pain; you may repeat every 5 minutes for up to 3 doses.    Carvedilol 3.125 Mg Tabs (Carvedilol) .Marland Kitchen... 1 tab two times a day  His updated medication list for this problem includes:    Aspirin 81 Mg Tbec (Aspirin) .Marland Kitchen... Take one tablet by mouth daily    Lisinopril 10 Mg Tabs (Lisinopril) .Marland Kitchen... 1 tab once daily    Nitrostat 0.4 Mg Subl (Nitroglycerin) .Marland Kitchen... 1 tablet under tongue at onset of chest pain; you may repeat every 5 minutes for up to 3 doses.    Carvedilol 3.125 Mg Tabs (Carvedilol) .Marland Kitchen... 1 tab two times a day  Problem # 3:  HYPERTENSION, BENIGN (ICD-401.1) Will increase Lisinopril to 20 mg by mouth Qdaily.   His updated medication list for this  problem includes:    Aspirin 81 Mg Tbec (Aspirin) .Marland Kitchen... Take one tablet by mouth daily    Lisinopril 20 Mg Tabs (Lisinopril) .Marland Kitchen... Take one tablet by mouth daily    Carvedilol 3.125 Mg Tabs (Carvedilol) .Marland Kitchen... 1 tab two times a day  Patient Instructions: 1)  Your physician recommends that you schedule a follow-up appointment in: 4 weeks. 2)  Your physician has recommended you make the following change in your medication: INCREASE your Lisopril to 20mg  by mouth daily. 3)  Your physician has requested that you have a stress echocardiogram. For further information please visit [email or web address]  Please follow instruction sheet as given. Please hold your COREG the morning of the procedure. Prescriptions: LISINOPRIL 20 MG TABS (LISINOPRIL) Take one tablet by mouth daily  #30 x 3   Entered by:   Whitney Maeola Sarah RN  Authorized by:   Verne Carrow, MD   Signed by:   Ellender Hose RN on 03/21/2010   Method used:   Electronically to        [facility]  [address] (retail)       [address], [address]       [place], [place]  [address]       Ph: [phone]       Fax: [phone]   RxID:   [id number]

## 2010-07-18 NOTE — Progress Notes (Signed)
Summary: rx NTG  Phone Note Refill Request Message from:  Patient on September 06, 2009 4:24 PM  Refills Requested: Medication #1:  NITROSTAT 0.4 MG SUBL 1 tablet under tongue at onset of chest pain; you may repeat every 5 minutes for up to 3 doses.   Supply Requested: 1 year [facility] in [place] [phone]   Method Requested: Fax to Local Pharmacy Initial call taken by: Migdalia Dk,  September 06, 2009 4:24 PM    Prescriptions: NITROSTAT 0.4 MG SUBL (NITROGLYCERIN) 1 tablet under tongue at onset of chest pain; you may repeat every 5 minutes for up to 3 doses.  #25 x 9   Entered by:   Danielle Rankin, CMA   Authorized by:   Verne Carrow, MD   Signed by:   Danielle Rankin, CMA on 09/07/2009   Method used:   Electronically to        [facility]  [address] (retail)       [address], [address]       [place], [place]  [address]       Ph: [phone]       Fax: [phone]   RxID:   [id number]

## 2010-07-18 NOTE — Progress Notes (Signed)
Summary: Stress Echo pre-procedure  Phone Note Outgoing Call   Call placed by: Antionette Char RN,  April 10, 2010 3:59 PM Call placed to: Patient Reason for Call: Confirm/change Appt Summary of Call: Left message on patient's answering machine with Stress Echo instructions. Patient instructed to hold Coreg the morning of test per orders.

## 2010-07-18 NOTE — Assessment & Plan Note (Signed)
Summary: per check out/sf   Visit Type:  Follow-up Primary Samaiya Awadallah:  Dr. Reche Dixon  CC:  chest pain - same.  History of Present Illness: 55 yo male with h/o CAD s/p 7V CABG in 2000 at [facility], HTN, hyperlipidemia here today for follow up. He was seen as a new pt in January 2011 with complaints of chest pain. He has been followed by Dr. Juliann Pares in [place] and has had yearly stress tests. He  lost his job and insurance and Dr. Glennis Brink office did not allow him to come in for a return visit.  I arranged a cath which was performed on 07/21/09. This showed 5/7 patent bypass grafts but good flow into both diagonals (the targets of the occluded grafts). I elected for medical management. He was here four weeks ago with complaints of chest pain. He had been seen in the [facility] ED four days prior to that appt  with headache, BP was elevated.  He was treated for his headache. Head CT was negative. He was sent home.  He  described  almost daily chest pressure, upper left chest. No associated SOB, dizziness, near syncope or syncope. The chest pressure improves with NTG. Most of the time it only lasts for several minutes. I arranged a stress echo . He exercised for 14 minutes with no chest pain and no EKG changes suggestive of ischemia. His echo showed septal, apical and inferobasal hypokinesis with mild worsening of the septal hypokinesis with exercise. His chest pain is mostly at rest. He has a prescription for Imdur at home but has not started this medication yet.   Current Medications (verified): 1)  Crestor 10 Mg Tabs (Rosuvastatin Calcium) .Marland Kitchen.. 1 Tab Once Daily 2)  Aspirin 81 Mg Tbec (Aspirin) .... Take One Tablet By Mouth Daily 3)  Lisinopril 20 Mg Tabs (Lisinopril) .... Take One Tablet By Mouth Daily 4)  Nitrostat 0.4 Mg Subl (Nitroglycerin) .Marland Kitchen.. 1 Tablet Under Tongue At Onset of Chest Pain; You May Repeat Every 5 Minutes For Up To 3 Doses. 5)  Carvedilol 3.125 Mg Tabs  (Carvedilol) .Marland Kitchen.. 1 Tab Two Times A Day 6)  Promethazine Hcl 25 Mg Tabs (Promethazine Hcl) .... As Needed  Allergies (verified): No Known Drug Allergies  Past History:  Past Medical History: Reviewed history from 09/02/2009 and no changes required. CAD s/p 7 V CABG 2000 at [facility] with cath 2/11, 5/7 patent grafts, EF 40%. HTN Hyperlipidemia  Social History: Reviewed history from 07/12/2009 and no changes required. Former tobacco abuse, none since 1999 No alcohol No illicit drug [place] Single 3 children Unemployed  Review of Systems       The patient complains of chest pain.  The patient denies fatigue, malaise, fever, weight gain/loss, vision loss, decreased hearing, hoarseness, palpitations, shortness of breath, prolonged cough, wheezing, sleep apnea, coughing up blood, abdominal pain, blood in stool, nausea, vomiting, diarrhea, heartburn, incontinence, blood in urine, muscle weakness, joint pain, leg swelling, rash, skin lesions, headache, fainting, dizziness, depression, anxiety, enlarged lymph nodes, easy bruising or bleeding, and environmental allergies.    Vital Signs:  Patient profile:   55 year old male Height:      68 inches Weight:      129 pounds BMI:     19.69 Pulse rate:   70 / minute BP sitting:   122 / 66  (left arm) Cuff size:   regular  Vitals Entered By: Hardin Negus, RMA (April 18, 2010 2:17 PM)  Physical Exam  General:  General: Well developed, well nourished, NAD Musculoskeletal: Muscle strength 5/5 all ext Psychiatric: Mood and affect normal Neck: No JVD, no carotid bruits, no thyromegaly, no lymphadenopathy. Lungs:Clear bilaterally, no wheezes, rhonci, crackles CV: RRR no murmurs, gallops rubs Abdomen: soft, NT, ND, BS present Extremities: No edema, pulses 2+.    Stress Echocardiogram  Procedure date:  04/11/2010  Findings:      Stress ECG conclusions: The stress ECG was normal. - Peak stress: The estimated LV  ejection fraction was 60%. Severe   hypokinesis of the mid anteroseptal LV myocardium. - Impressions: Stress echo difficult to interpret due to baseline   abnormalities. At rest EF 45-50% with septal , apical and   inferobasal hypokinesis. With stress   no improvement in inferobase. Septal hypokinesis became worse.   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   Overall prognosis is good though as he exercised with normal ECG   to 17.2 METS !! Impressions:  - Stress echo difficult to interpret due to baseline abnormalities.   At rest EF 45-50% with septal , apical and inferobasal   hypokinesis. With stress   no improvement in inferobase. Septal hypokinesis became worse.   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   Overall prognosis is good though as he exercised with normal ECG   to 17.2 METS !!  Impression & Recommendations:  Problem # 1:  CAD, ARTERY BYPASS GRAFT (ICD-414.04) Stable disease. No large areas of ischemia on stress testing. Excellent exercise tolerance (17 METS) without EKG changes. Continue medical management. Add Imdur 30 mg by mouth Qdaily.   His updated medication list for this problem includes:    Aspirin 81 Mg Tbec (Aspirin) .Marland Kitchen... Take one tablet by mouth daily    Lisinopril 20 Mg Tabs (Lisinopril) .Marland Kitchen... Take one tablet by mouth daily    Nitrostat 0.4 Mg Subl (Nitroglycerin) .Marland Kitchen... 1 tablet under tongue at onset of chest pain; you may repeat every 5 minutes for up to 3 doses.    Carvedilol 3.125 Mg Tabs (Carvedilol) .Marland Kitchen... 1 tab two times a day  Patient Instructions: 1)  Your physician recommends that you schedule a follow-up appointment in: 6 months 2)  Your physician recommends that you continue on your current medications as directed. Please refer to the Current Medication list given to you  today. Prescriptions: PROMETHAZINE HCL 25 MG TABS (PROMETHAZINE HCL) as needed  #30 x 0   Entered by:   Hardin Negus, RMA   Authorized by:   Verne Carrow, MD   Signed by:   Hardin Negus, RMA on 04/18/2010   Method used:   Electronically to        [facility]  [address] (retail)       [address], [address]       [place], [place]  [address]       Ph: [phone]       Fax: [phone]   RxID:   [id number]

## 2010-07-18 NOTE — Progress Notes (Signed)
Summary: high bp/headache  Phone Note Call from Patient   Caller: Patient Reason for Call: Talk to Nurse Summary of Call: p[t calling to make an appt for high bp/headache, made an appt tues 10-4, also wants a nurse call [phone] Initial call taken by: Glynda Jaeger,  March 17, 2010 8:50 AM  Follow-up for Phone Call        SPOKE WITH PT STATED WENT TO ER YESTERDAY WITH c/o h/a AND  B/P OF  200/147 WAS MONITORED AND HAD HEAD CT TO R/O STROKE  B/P AT TIME OF DISCHARGE WAS 141/83.PER PT WAS GIVEN  PERCOCET AT HOSPITAL  FOR PAIN PT  ALSO STATES HAS BEEN HAVING INCREASE IN ANGINA IS OCCURING AT REST AND  HAS BEEN TAKING NTG.INFORMED PT TO REDUCE ACTIVITIES UNTIL SEEN ON 03/21/10 AND INFORMED IF S/S INCREASE OR NO RELIEF FROM NTG TO GO TO ER FOR EVAL AND TX.PT VERBALIZED UNDERSTANDING Follow-up by: Scherrie Bateman, LPN,  March 17, 2010 11:07 AM  Additional Follow-up for Phone Call Additional follow up Details #1::        Agree. cdm Additional Follow-up by: Verne Carrow, MD,  March 17, 2010 2:01 PM

## 2010-08-21 ENCOUNTER — Ambulatory Visit: Payer: Self-pay | Admitting: Rehabilitative and Restorative Service Providers"

## 2010-09-04 ENCOUNTER — Ambulatory Visit: Payer: Self-pay | Admitting: Rehabilitative and Restorative Service Providers"

## 2010-09-23 ENCOUNTER — Other Ambulatory Visit: Payer: Self-pay | Admitting: Cardiovascular Disease

## 2010-09-26 LAB — POCT I-STAT, CHEM 8
Creatinine, Ser: 1 mg/dL (ref 0.4–1.5)
Glucose, Bld: 128 mg/dL — ABNORMAL HIGH (ref 70–99)
Hemoglobin: 14.6 g/dL (ref 13.0–17.0)
Potassium: 3.9 mEq/L (ref 3.5–5.1)
TCO2: 28 mmol/L (ref 0–100)

## 2010-10-04 ENCOUNTER — Other Ambulatory Visit: Payer: Self-pay | Admitting: Cardiovascular Disease

## 2011-01-04 ENCOUNTER — Encounter (INDEPENDENT_AMBULATORY_CARE_PROVIDER_SITE_OTHER): Payer: Self-pay | Admitting: Surgery

## 2011-01-10 ENCOUNTER — Ambulatory Visit (INDEPENDENT_AMBULATORY_CARE_PROVIDER_SITE_OTHER): Payer: Self-pay | Admitting: Surgery

## 2011-01-18 ENCOUNTER — Ambulatory Visit (INDEPENDENT_AMBULATORY_CARE_PROVIDER_SITE_OTHER): Payer: PRIVATE HEALTH INSURANCE | Admitting: General Surgery

## 2011-01-18 ENCOUNTER — Encounter (INDEPENDENT_AMBULATORY_CARE_PROVIDER_SITE_OTHER): Payer: Self-pay | Admitting: General Surgery

## 2011-01-18 VITALS — BP 110/70 | HR 68 | Temp 97.2°F | Ht 67.0 in | Wt 127.8 lb

## 2011-01-18 DIAGNOSIS — K4090 Unilateral inguinal hernia, without obstruction or gangrene, not specified as recurrent: Secondary | ICD-10-CM | POA: Insufficient documentation

## 2011-01-18 NOTE — Progress Notes (Signed)
[name] Sosa is a 55 y.o. male.    Chief Complaint  Patient presents with  . Other    new pt-ingiunal hernia    HPI HPI 55 year old male referred by [facility] for evaluation of a left inguinal hernia. Patient states that he's had a bulge in his left groin for about 7 years. It causes him discomfort. He describes it as a pressure. He does have some occasional nausea. He denies any vomiting, constipation, or diarrhea. He has not noticed a bulge on the right side. He has always been able to reduce the hernia. He denies any dysuria. However he does endorse urinary urgency. He states that he feels that he does not completely empty his bladder after he urinates. He denies any abdominal surgery. He denies any numbness or tingling in his groin.  Past Medical History  Diagnosis Date  . Hypertension   . Hyperlipidemia     Past Surgical History  Procedure Date  . Coronary artery bypass graft     History reviewed. No pertinent family history.  Social History History  Substance Use Topics  . Smoking status: Former Smoker    Quit date: 06/19/1998  . Smokeless tobacco: Not on file  . Alcohol Use: No    No Known Allergies  Current Outpatient Prescriptions  Medication Sig Dispense Refill  . aspirin 81 MG tablet Take 81 mg by mouth daily.        Marland Kitchen CARVEDILOL PO Take 10 mg by mouth daily.        Marland Kitchen LISINOPRIL PO Take 15 mg by mouth daily.        . rosuvastatin (CRESTOR) 10 MG tablet Take 10 mg by mouth daily.        Marland Kitchen lisinopril (PRINIVIL,ZESTRIL) 20 MG tablet TAKE ONE TABLET BY MOUTH EVERY DAY  30 tablet  5  . NITROSTAT 0.4 MG SL tablet DISSOLVE ONE TABLET UNDER THE TONGUE AT ONSET OF CHEST PAIN; YOU MAY REPEAT EVERY 5 MINUTES FOR UP TO 3 DOSES.  25 each  8    Review of Systems Review of Systems  Constitutional: Negative for fever, chills and weight loss.  HENT: Negative for sore throat.   Eyes: Negative for blurred vision.       No amaurosis fugax  Respiratory: Negative for  shortness of breath.        No DOE  Cardiovascular: Positive for chest pain (some occasional chest pain. neg stress tests per pt). Negative for orthopnea, leg swelling and PND.  Gastrointestinal: Positive for nausea. Negative for vomiting, diarrhea, constipation, blood in stool and melena.  Genitourinary: Positive for urgency and frequency. Negative for dysuria, hematuria and flank pain.  Musculoskeletal: Negative.   Neurological: Negative.  Negative for headaches.  Endo/Heme/Allergies: Negative.   Psychiatric/Behavioral: Negative.     Physical Exam Physical Exam  Vitals reviewed. Constitutional: He is oriented to person, place, and time. He appears well-developed and well-nourished.  HENT:  Head: Normocephalic and atraumatic.  Eyes: Conjunctivae are normal. No scleral icterus.  Neck: Normal range of motion. Neck supple. No JVD present. No tracheal deviation present. No thyromegaly present.  Cardiovascular: Normal rate, regular rhythm and normal heart sounds.   Respiratory: Effort normal. No respiratory distress. He has wheezes (mild wheezes). He has no rales.  GI: Soft. Bowel sounds are normal. He exhibits no distension. There is no tenderness. There is no rebound. A hernia is present. Hernia confirmed positive in the left inguinal area. Hernia confirmed negative in the right inguinal area.  Genitourinary:  Penis normal.    Right testis shows no mass, no swelling and no tenderness. Left testis shows no mass, no swelling and no tenderness.  Musculoskeletal: Normal range of motion.  Lymphadenopathy:    He has no cervical adenopathy.       Right: No inguinal adenopathy present.       Left: No inguinal adenopathy present.  Neurological: He is alert and oriented to person, place, and time.  Skin: Skin is warm and dry.  Psychiatric: He has a normal mood and affect. His behavior is normal. Judgment and thought content normal.     Blood pressure 110/70, pulse 68, temperature 97.2 F (36.2  C), temperature source Temporal, height 5\' 7"  (1.702 m), weight 127 lb 12.8 oz (57.97 kg).  Data reviewed: I reviewed Dr. Rhunette Croft note from [facility]. I reviewed his cardiologist note as well.  Assessment/Plan 55 year old male with hypertension, hyperlipidemia, coronary artery disease and a left inguinal hernia.  We discussed both operative and nonoperative management. We discussed the wearing of a truss. We discussed the signs and symptoms of incarceration and strangulation. He was given Agricultural engineer. We discussed surgical intervention specifically a laparoscopic repair. The patient has elected to continue nonoperative management for the time being. He states that he will call the office when he has decided he would like to pursue surgical intervention.  We will see him on a p.r.n. basis.  Gaynelle Adu M 01/18/2011, 3:21 PM

## 2011-01-18 NOTE — Patient Instructions (Addendum)
Call office when you decide you want your hernia repaired [phone]   Hernia A hernia occurs when an internal organ pushes out through a weak spot in the belly (abdominal) wall. Hernias most commonly occur in the groin and around the navel. Hernias also can occur through by cut (incision) made by the surgeon after an abdominal operation. Hernias often can be pushed back into place (reduced). Most hernias tend to get worse over time. Problems occur when abdominal contents get stuck in the opening (incarcerated hernia). The blood supply becomes blocked or impaired (strangulated hernia). Because of these risks, you may require surgery to repair the hernia. CAUSES  Heavy lifting.   Prolonged coughing.   Straining to move your bowels.   Hernias can also occur through a cut (incision) by a surgeon after an abdominal operation.  HOME CARE INSTRUCTIONS  Bed rest is not required. You may continue your normal activities. Avoid heavy lifting (more than 10 pounds) or straining. Cough gently. If you are a smoker it is best to stop. Even the best hernia repair can break down with the continual strain of coughing. Even if you do not have your hernia repaired, a cough will continue to aggravate the problem.   Do not wear anything tight over your hernia. Do not try to keep it in with an outside bandage or truss. These can damage abdominal contents if they are trapped within the hernia sac.   Eat a normal diet. Avoid constipation. Straining over long periods of time will increase hernia size and encourage breakdown of repairs. If you cannot do this with diet alone, stool softeners may be used.  SEEK IMMEDIATE MEDICAL CARE IF: You have problems (symptoms) of a trapped (incarcerated) hernia:  You develop an oral temperature above 101, or as your caregiver suggests.   You develop increasing abdominal pain.   You feel sick to your stomach (nausea) and vomiting.   The hernia is stuck outside the abdomen,  looks discolored, feels hard, or is tender.   You have any changes in your bowel habits or in the hernia that is unusual for you.   You have increased pain or swelling around the hernia.   You cannot push the hernia back in place by applying gentle pressure while lying down.  MAKE SURE YOU:   Understand these instructions.   Will watch your condition.   Will get help right away if you are not doing well or get worse.  Document Released: 06/04/2005 Document Re-Released: 04/01/2009 [facility] Patient Information 2011 [place], [place].

## 2011-02-01 ENCOUNTER — Inpatient Hospital Stay (INDEPENDENT_AMBULATORY_CARE_PROVIDER_SITE_OTHER)
Admission: RE | Admit: 2011-02-01 | Discharge: 2011-02-01 | Disposition: A | Discharge status: Home / Self Care | Payer: Self-pay | Source: Ambulatory Visit | Attending: Family Medicine | Admitting: Family Medicine

## 2011-02-01 DIAGNOSIS — S335XXA Sprain of ligaments of lumbar spine, initial encounter: Secondary | ICD-10-CM

## 2011-03-15 ENCOUNTER — Telehealth (INDEPENDENT_AMBULATORY_CARE_PROVIDER_SITE_OTHER): Payer: Self-pay | Admitting: General Surgery

## 2011-03-15 ENCOUNTER — Encounter (INDEPENDENT_AMBULATORY_CARE_PROVIDER_SITE_OTHER): Payer: Self-pay | Admitting: General Surgery

## 2011-03-15 NOTE — Telephone Encounter (Signed)
Patient called back and spoke with Huntley Dec. He sees Dr [name]. I will fax a clearance request to him.

## 2011-03-15 NOTE — Telephone Encounter (Signed)
Patient called to try to set up surgery for left inguinal hernia repair. Dr Andrey Campanile wrote orders. He is requesting clearance due to his coronary artery disease. I called patient to figure out who his cardiologist is and am awaiting a call back.

## 2011-03-19 ENCOUNTER — Telehealth (INDEPENDENT_AMBULATORY_CARE_PROVIDER_SITE_OTHER): Payer: Self-pay | Admitting: General Surgery

## 2011-03-19 ENCOUNTER — Telehealth: Payer: Self-pay | Admitting: *Deleted

## 2011-03-19 NOTE — Telephone Encounter (Signed)
Received fax from [facility] requesting surgical clearance. Pt will need appt with Dr. [name] prior to being cleared. Pt was last seen in November 2011. Spoke with Annabelle Harman at [facility] and gave her this information. Per Annabelle Harman our office will need to contact pt to schedule appt. I called pt to schedule appt and left message to call back

## 2011-03-19 NOTE — Telephone Encounter (Signed)
Spoke with pt. Offered him appt on March 28, 2011 at 9:45. He has to check schedule and will call back to schedule appt.

## 2011-03-19 NOTE — Telephone Encounter (Signed)
Rec'd call from [facility] stating they received request for medical clearance. However the patient has not been seen by their office in over a year. Pt had an appt with them in May and did not show up. I advised the patient was scheduled for sx in July, but there was nothing else noted. Asked they contact the patient to advise he needs to come in to see them as originally requested.

## 2011-03-20 ENCOUNTER — Telehealth: Payer: Self-pay | Admitting: Cardiovascular Disease

## 2011-03-20 NOTE — Telephone Encounter (Signed)
Pt said he wants to get in sooner so he can get surgery done please call

## 2011-03-22 NOTE — Telephone Encounter (Signed)
Pt will see Dr. [name] on October 10 ,2012 at 10:30

## 2011-03-22 NOTE — Telephone Encounter (Signed)
Spoke with pt and appt made for him to see Dr. [name] on March 28, 2011 at 10:30

## 2011-03-28 ENCOUNTER — Encounter: Payer: Self-pay | Admitting: Cardiovascular Disease

## 2011-03-28 ENCOUNTER — Ambulatory Visit (INDEPENDENT_AMBULATORY_CARE_PROVIDER_SITE_OTHER): Payer: Self-pay | Admitting: Cardiovascular Disease

## 2011-03-28 VITALS — BP 133/84 | HR 60 | Ht 68.0 in | Wt 125.0 lb

## 2011-03-28 DIAGNOSIS — I25119 Atherosclerotic heart disease of native coronary artery with unspecified angina pectoris: Secondary | ICD-10-CM | POA: Insufficient documentation

## 2011-03-28 DIAGNOSIS — Z01810 Encounter for preprocedural cardiovascular examination: Secondary | ICD-10-CM

## 2011-03-28 DIAGNOSIS — I2510 Atherosclerotic heart disease of native coronary artery without angina pectoris: Secondary | ICD-10-CM

## 2011-03-28 NOTE — Patient Instructions (Signed)
Your physician wants you to follow-up in:  12 months.  You will receive a reminder letter in the mail two months in advance. If you don't receive a letter, please call our office to schedule the follow-up appointment.   

## 2011-03-28 NOTE — Assessment & Plan Note (Signed)
NO further cardiac workup prior to planned surgical procedure.

## 2011-03-28 NOTE — Progress Notes (Signed)
History of Present Illness:55 yo [name] Sosa with h/o CAD s/p 7V CABG in 2000 at [facility], HTN, hyperlipidemia here today for follow up. He was seen as a new pt in January 2011 with complaints of chest pain. He has been followed by Dr. Juliann Pares in [place] and has had yearly stress tests. He  lost his job and insurance and Dr. Glennis Brink office did not allow him to come in for a return visit.  I arranged a cath which was performed on 07/21/09. This showed 5/7 patent bypass grafts but good flow into both diagonals (the targets of the occluded grafts). I elected for medical management. He was here in October 2011 with complaints of chest pain.  He  described  almost daily chest pressure, upper left chest. No associated SOB, dizziness, near syncope or syncope. The chest pressure improved with NTG.  I arranged a stress echo . He exercised for 14 minutes with no chest pain and no EKG changes suggestive of ischemia. His echo showed septal, apical and inferobasal hypokinesis with mild worsening of the septal hypokinesis with exercise. His chest pain was mostly at rest. We started Imdur at that time.   He is here today for follow up. He has plans for a hernia repair. He has occasional chest pains but very infrequent. Energy level is good. He has no SOB. 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 He has an autistic son who is 52 yo.   Past Medical History  Diagnosis Date  . Hypertension   . Hyperlipidemia   . CAD (coronary artery disease)     s/p 7 vessel CABG 2000 at [facility]    Past Surgical History  Procedure Date  . Coronary artery bypass graft   . Cardiac catheterization   . 4v cabg   . Patent grafts   . S/p 7     Current Outpatient Prescriptions  Medication Sig Dispense Refill  . aspirin 81 MG tablet Take 81 mg by mouth daily.        . carvedilol (COREG) 3.125 MG tablet Take 3.125 mg by mouth 2 (two) times daily with a meal.        . lisinopril  (PRINIVIL,ZESTRIL) 5 MG tablet 5mg  tabs  Take 3 tabs  1 time a day       . NITROSTAT 0.4 MG SL tablet DISSOLVE ONE TABLET UNDER THE TONGUE AT ONSET OF CHEST PAIN; YOU MAY REPEAT EVERY 5 MINUTES FOR UP TO 3 DOSES.  25 each  8  . rosuvastatin (CRESTOR) 10 MG tablet Take 10 mg by mouth daily.          No Known Allergies  History   Social History  . Marital Status: Married    Spouse Name: N/A    Number of Children: 3  . Years of Education: N/A   Occupational History  .     Social History Main Topics  . Smoking status: Former Smoker    Quit date: 06/19/1998  . Smokeless tobacco: Not on file  . Alcohol Use: No  . Drug Use: No  . Sexually Active:    Other Topics Concern  . Not on file   Social History Narrative  . No narrative on file    Family History  Problem Relation Age of Onset  . Heart failure Mother   . Heart failure Father     Review of Systems:  As stated in the HPI and otherwise negative.  BP 133/84  Pulse 60  Ht 5\' 8"  (1.727 m)  Wt 125 lb (56.7 kg)  BMI 19.01 kg/m2  Physical Examination: General: Well developed, well nourished, NAD HEENT: OP clear, mucus membranes moist SKIN: warm, dry. No rashes. Neuro: No focal deficits Musculoskeletal: Muscle strength 5/5 all ext Psychiatric: Mood and affect normal Neck: No JVD, no carotid bruits, no thyromegaly, no lymphadenopathy. Lungs:Clear bilaterally, no wheezes, rhonci, crackles Cardiovascular: Regular rate and rhythm. No murmurs, gallops or rubs. Abdomen:Soft. Bowel sounds present. Non-tender.  Extremities: No lower extremity edema. Pulses are 2 + in the bilateral DP/PT.  ZOX:WRUEA brady, rate 58 bpm. Possible old inferior MI, small q-waves inferior leads.

## 2011-03-28 NOTE — Assessment & Plan Note (Signed)
Stable post 7 vessel bypass in 2000. Stress echo in October 2011 without ischemia. He is doing well. Continue current therapy which includes ASA, beta blocker, Ace-inhibitor and a statin. No further cardiac workup prior to planned hernia surgery.

## 2011-03-30 ENCOUNTER — Other Ambulatory Visit (INDEPENDENT_AMBULATORY_CARE_PROVIDER_SITE_OTHER): Payer: Self-pay | Admitting: General Surgery

## 2011-03-30 ENCOUNTER — Telehealth: Payer: Self-pay | Admitting: Cardiology

## 2011-03-30 MED ORDER — TAMSULOSIN HCL 0.4 MG PO CAPS: 0.4000 mg | ORAL_CAPSULE | Freq: Every day | ORAL | Status: DC

## 2011-03-30 NOTE — Telephone Encounter (Signed)
Pt called about paperwork  Please call

## 2011-03-30 NOTE — Telephone Encounter (Signed)
Spoke with pt and told him I would send Dr. Gibson Ramp note clearing him for surgery to Dr. Tawana Scale office([facility]). Note faxed to [phone]

## 2011-03-30 NOTE — Progress Notes (Signed)
Patient aware called to [facility].

## 2011-04-05 ENCOUNTER — Telehealth (INDEPENDENT_AMBULATORY_CARE_PROVIDER_SITE_OTHER): Payer: Self-pay | Admitting: General Surgery

## 2011-04-05 ENCOUNTER — Ambulatory Visit (HOSPITAL_COMMUNITY)
Admission: RE | Admit: 2011-04-05 | Discharge: 2011-04-05 | Disposition: A | Discharge status: Home / Self Care | Payer: Self-pay | Source: Ambulatory Visit | Attending: General Surgery | Admitting: General Surgery

## 2011-04-05 ENCOUNTER — Other Ambulatory Visit (INDEPENDENT_AMBULATORY_CARE_PROVIDER_SITE_OTHER): Payer: Self-pay | Admitting: General Surgery

## 2011-04-05 ENCOUNTER — Other Ambulatory Visit (HOSPITAL_COMMUNITY): Payer: Self-pay

## 2011-04-05 DIAGNOSIS — Z01812 Encounter for preprocedural laboratory examination: Secondary | ICD-10-CM | POA: Insufficient documentation

## 2011-04-05 DIAGNOSIS — Z8719 Personal history of other diseases of the digestive system: Secondary | ICD-10-CM

## 2011-04-05 DIAGNOSIS — Z01818 Encounter for other preprocedural examination: Secondary | ICD-10-CM | POA: Insufficient documentation

## 2011-04-05 DIAGNOSIS — Z9889 Other specified postprocedural states: Secondary | ICD-10-CM

## 2011-04-05 LAB — SURGICAL PCR SCREEN: Staphylococcus aureus: NEGATIVE

## 2011-04-05 LAB — BASIC METABOLIC PANEL
BUN: 16 mg/dL (ref 6–23)
Calcium: 10 mg/dL (ref 8.4–10.5)
Creatinine, Ser: 0.75 mg/dL (ref 0.50–1.35)
GFR calc non Af Amer: >90 mL/min (ref >90)
Glucose, Bld: 120 mg/dL — ABNORMAL HIGH (ref 70–99)
Sodium: 140 mEq/L (ref 135–145)

## 2011-04-05 LAB — CBC
Hemoglobin: 14.2 g/dL (ref 13.0–17.0)
MCH: 30.9 pg (ref 26.0–34.0)
MCHC: 34.6 g/dL (ref 30.0–36.0)
MCV: 89.1 fL (ref 78.0–100.0)

## 2011-04-05 NOTE — Telephone Encounter (Signed)
Pt called while at [facility] (pre op) stating he did not pick up his prescription yesterday that is needed for sx on 04/06/11 by Dr. Andrey Campanile. Confirmed with [name] the pt needs to pick it up asap upon leaving [facility] and take immediately.

## 2011-04-06 ENCOUNTER — Ambulatory Visit: Payer: Self-pay | Admitting: Cardiovascular Disease

## 2011-04-06 ENCOUNTER — Ambulatory Visit (HOSPITAL_COMMUNITY)
Admission: RE | Admit: 2011-04-06 | Discharge: 2011-04-06 | Disposition: A | Discharge status: Home / Self Care | Payer: Medicaid Other | Source: Ambulatory Visit | Attending: General Surgery | Admitting: General Surgery

## 2011-04-06 DIAGNOSIS — I10 Essential (primary) hypertension: Secondary | ICD-10-CM | POA: Insufficient documentation

## 2011-04-06 DIAGNOSIS — K4090 Unilateral inguinal hernia, without obstruction or gangrene, not specified as recurrent: Secondary | ICD-10-CM | POA: Insufficient documentation

## 2011-04-06 DIAGNOSIS — I2510 Atherosclerotic heart disease of native coronary artery without angina pectoris: Secondary | ICD-10-CM | POA: Insufficient documentation

## 2011-04-06 DIAGNOSIS — Z01818 Encounter for other preprocedural examination: Secondary | ICD-10-CM | POA: Insufficient documentation

## 2011-04-08 ENCOUNTER — Telehealth (INDEPENDENT_AMBULATORY_CARE_PROVIDER_SITE_OTHER): Payer: Self-pay | Admitting: General Surgery

## 2011-04-08 NOTE — Telephone Encounter (Signed)
He is s/p Laparoscopic left inguinal hernia repair on 04/06/11 by Dr. Andrey Campanile.  He is having some pain in the upper left leg and periumbilical area.  I told him this was not unusual and may take a week or so to improve depending upon how active he is.  He is also having a problem with constipation and I recommend a stool softener and Milk of Magnesia twice a day.

## 2011-04-21 NOTE — Op Note (Signed)
NAMENICHALAS, COIN NO.:  [id number]  MEDICAL RECORD NO.:  [id number]  LOCATION:  [facility]                         FACILITY:  [facility]  PHYSICIAN:  Mary Sella. Andrey Campanile, MD     DATE OF BIRTH:  [birth year]  DATE OF PROCEDURE:  04/06/2011 DATE OF DISCHARGE:                              OPERATIVE REPORT   PREOPERATIVE DIAGNOSIS:  Left inguinal hernia.  POSTOPERATIVE DIAGNOSIS:  Left indirect inguinal hernia.  PROCEDURE:  Laparoscopic repair of left indirect inguinal hernia with mesh (TAPP).  SURGEON:  Mary Sella. Andrey Campanile, MD  ANESTHESIA:  General plus 30 mL of 0.25% Marcaine with epi.  FINDINGS:  The patient only had evidence of a defect lateral to the inferior epigastric vessels on the left consistent with an indirect inguinal hernia.  There was no contralateral defect.  A 3 inch x 6 inch piece of UltraPro mesh was ceased to repair the defect.  INDICATIONS FOR PROCEDURE:  The patient is a very pleasant 55 year old gentleman who has a prior history of hypertension, hyperlipidemia, and coronary artery disease who noticed a bulge as well as discomfort in his left groin.  He is known he has had a bulge in his groin for about 7 years; however, it has been causing him more and more discomfort.  We discussed the risks and benefits of surgery including, but not limited to bleeding, infection, injury to surrounding structures, testicular loss, chronic inguinal pain, urinary retention, blood clot formation, hematoma formation, hernia recurrence, chronic inguinal pain, need to convert to an open procedure, as well as typical postoperative recovery course.  The patient elected to proceed to the operating room.  DESCRIPTION OF PROCEDURE:  The patient confirmed the left groin as being the operative site in the holding area and this was marked with my initials.  He was then taken to the operating room, placed supine on the operating table.  General endotracheal anesthesia was  established. Sequential compression devices were placed.  His abdomen was prepped and draped in usual standard surgical fashion with ChloraPrep.  He received Ancef prior to skin incision.  He had emptied his bladder prior to going to the operating room.  Surgical time-out was performed.  I infiltrated local at the base of his umbilicus.  Next, a 1.5 cm vertical infraumbilical incision was made with a #11 blade.  The fascia was grasped and lifted anteriorly.  Next, the fascia was incised with a #11 blade.  The abdominal cavity was entered.  A pursestring suture consisting of 0-Vicryl was placed around the fascial edges and a 12-mm Hasson trocar was placed.  Pneumoperitoneum was smoothly established up to a patient pressure of 15 mmHg.  Laparoscope was advanced and the abdominal cavity was surveilled.  There was evidence of left inguinal hernia consistent with an indirect hernia as it was lateral to the inferior epigastric vessels.  There was no defect on the contralateral side.  I then placed two 5-mm trocars in the midclavicular line slightly above the level of the umbilicus, one on the left and one on the right under direct visualization after local had been infiltrated.  I then incised the peritoneum using Endoshears with electrocautery starting 1 or 2  inches above the left anterior superior iliac spine and carrying the incision medial towards the median umbilical ligament taking care not to injure the inferior epigastric vessels.  I then created a thin peritoneal flap, dissecting the flap downwards from the abdominal wall.  The dissection continued medially down to the pubic tubercle and the pubic bone.  The vas deferens and the testicular vessels were identified.  The indirect sac was grasped and then reduced using traction and countertraction.  The hernia sac was reduced and stripped away from the cord contents.  The bladder was dissected downwards as well.  I then created a large  pocket in the pelvis to accommodate the mesh.  The peritoneum was stripped off the surrounding structures taking care not to injure any surrounding nerves.  Once I created an adequate pocket, I obtained a 3 inch x 6 inch piece of Ethicon UltraPro mesh, placed it through the Hasson trocar placed into the left groin.  Half of the mesh was medial to the inferior epigastric vessels and half of that was lateral.  It did cover the indirect defect. One tack was placed in the Cooper's ligament, 1 tack was placed again along the medial edge of the mesh anteriorly against the abdominal wall in the pubic area.  One tack was placed on each side of the inferior epigastric vessels and another tack was placed laterally, so approximately 5 tacks were used to secure the mesh.  The hernia defect was well covered.  There was no redundancy in the mesh.  I then reduced pneumoperitoneum to 8 mmHg.  I then brought the peritoneal flap back up and tacked it to the abdominal wall using the Ethicon secure strap tacks.  Total of 5 tacks were used to secure the peritoneum back into the abdominal wall.  There was no gaps in the peritoneal closure.  I then injected local 1 fingerbreadth inferior and medial to the anterior superior iliac spine on the left.  I then removed the Hasson trocar.  I tied down the previously placed pursestring suture thus obliterating the fascial defect at the umbilicus.  Some additional local was placed in the preperitoneal space at the umbilicus.  I then released pneumoperitoneum and removed the 2 remaining trocars.  The skin incisions were closed with a 4-0 Monocryl in a subcuticular fashion.  It should be noted that there was a little bit of bleeding from the subcutaneous tissue in the left midclavicular 5 mm trocar site.  I had used electrocautery to obtain hemostasis.  Again that site was also closed with 4-0 Monocryl.  Dermabond was applied.  The patient was extubated and taken to the  recovery room in stable addition.  All needle, instrument, and sponge counts were correct x2.  There were no immediate complications.     Mary Sella. Andrey Campanile, MD     EMW/MEDQ  D:  04/06/2011  T:  04/06/2011  Job:  [id number]  cc:   [facility] Carrow, MD  Electronically Signed by Gaynelle Adu M.D. on 04/21/2011 12:18:23 PM

## 2011-05-03 ENCOUNTER — Telehealth (INDEPENDENT_AMBULATORY_CARE_PROVIDER_SITE_OTHER): Payer: Self-pay | Admitting: General Surgery

## 2011-05-04 ENCOUNTER — Encounter (INDEPENDENT_AMBULATORY_CARE_PROVIDER_SITE_OTHER): Payer: Self-pay | Admitting: General Surgery

## 2011-05-08 NOTE — Telephone Encounter (Signed)
Tried to call patient back but received a message that said "this is not a working number." When patient calls back please just overbook Dr Tawana Scale schedule since this patient will be very hard to reach.

## 2011-09-05 ENCOUNTER — Other Ambulatory Visit: Payer: Self-pay

## 2011-09-05 MED ORDER — LISINOPRIL 5 MG PO TABS: 5.0000 mg | ORAL_TABLET | Freq: Three times a day (TID) | ORAL | Status: DC

## 2011-09-24 ENCOUNTER — Encounter: Payer: Self-pay | Admitting: Cardiovascular Disease

## 2011-10-11 ENCOUNTER — Other Ambulatory Visit: Payer: Self-pay | Admitting: Nurse Practitioner

## 2011-10-12 NOTE — Telephone Encounter (Signed)
..   Requested Prescriptions   Pending Prescriptions Disp Refills  . carvedilol (COREG) 3.125 MG tablet [Pharmacy Med Name: CARVEDILOL 3.125MG   TAB] 60 tablet 6    Sig: TAKE ONE TABLET BY MOUTH TWICE DAILY

## 2011-11-23 ENCOUNTER — Other Ambulatory Visit: Payer: Self-pay | Admitting: Family Medicine

## 2011-11-26 NOTE — Telephone Encounter (Signed)
Is this ok this pt has not been here that I can see and his chart goes back to 2011 in the computer

## 2012-03-16 ENCOUNTER — Other Ambulatory Visit: Payer: Self-pay | Admitting: Cardiovascular Disease

## 2012-03-17 ENCOUNTER — Telehealth: Payer: Self-pay | Admitting: Cardiovascular Disease

## 2012-03-17 NOTE — Telephone Encounter (Signed)
Lisinopril 5 mg, uses [facility] [address] [phone]

## 2012-03-18 MED ORDER — LISINOPRIL 5 MG PO TABS: 5.0000 mg | ORAL_TABLET | Freq: Three times a day (TID) | ORAL | Status: DC

## 2012-03-18 NOTE — Telephone Encounter (Signed)
RX sent into pharmacy, pt aware.

## 2012-04-05 ENCOUNTER — Emergency Department: Payer: Self-pay | Admitting: Emergency Medicine

## 2012-04-10 ENCOUNTER — Ambulatory Visit (INDEPENDENT_AMBULATORY_CARE_PROVIDER_SITE_OTHER): Payer: Self-pay | Admitting: Cardiovascular Disease

## 2012-04-10 ENCOUNTER — Encounter: Payer: Self-pay | Admitting: Cardiovascular Disease

## 2012-04-10 VITALS — BP 130/76 | HR 75 | Ht 66.0 in | Wt 129.0 lb

## 2012-04-10 DIAGNOSIS — I25810 Atherosclerosis of coronary artery bypass graft(s) without angina pectoris: Secondary | ICD-10-CM

## 2012-04-10 MED ORDER — LOSARTAN POTASSIUM 50 MG PO TABS: 50.0000 mg | ORAL_TABLET | Freq: Every day | ORAL | Status: DC

## 2012-04-10 MED ORDER — ROSUVASTATIN CALCIUM 10 MG PO TABS: 10.0000 mg | ORAL_TABLET | Freq: Every day | ORAL | Status: DC

## 2012-04-10 MED ORDER — CARVEDILOL 3.125 MG PO TABS: 3.1250 mg | ORAL_TABLET | Freq: Two times a day (BID) | ORAL | Status: DC

## 2012-04-10 NOTE — Patient Instructions (Addendum)
Your physician wants you to follow-up in:  12 months.  You will receive a reminder letter in the mail two months in advance. If you don't receive a letter, please call our office to schedule the follow-up appointment.  Your physician recommends that you return for fasting lab work  Next week--Lipid and Liver profile  Your physician has recommended you make the following change in your medication:  Stop lisinopril.  Start Cozaar 50 mg by mouth daily

## 2012-04-10 NOTE — Progress Notes (Signed)
History of Present Illness: 56 yo [place] male with h/o CAD s/p 7V CABG in 2000 at [facility], HTN, hyperlipidemia here today for follow up. He was seen as a new pt in January 2011 with complaints of chest pain. He has been followed by Dr. Juliann Pares in [place] and has had yearly stress tests. He lost his job and insurance and Dr. Glennis Brink office did not allow him to come in for a return visit. I arranged a cath which was performed on 07/21/09. This showed 5/7 patent bypass grafts but good flow into both diagonals (the targets of the occluded grafts). I elected for medical management. He was here in October 2011 with complaints of chest pain. I arranged a stress echo . He exercised for 14 minutes with no chest pain and no EKG changes suggestive of ischemia. His echo showed septal, apical and inferobasal hypokinesis with mild worsening of the septal hypokinesis with exercise. His chest pain was mostly at rest. We started Imdur at that time.   He is here today for follow up.  He has daily chest pains which are described as spiders on his chest.No changes in character. He has no SOB. 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 He has an autistic son who is 10 yo.   Primary Care Physician: None  Last Lipid Profile:Lipid Panel     Component Value Date/Time   CHOL 164 06/05/2010 2105   TRIG 176* 06/05/2010 2105   HDL 45 06/05/2010 2105   CHOLHDL 3.6 Ratio 06/05/2010 2105   VLDL 35 06/05/2010 2105   LDLCALC 84 06/05/2010 2105     Past Medical History  Diagnosis Date  . Hypertension   . Hyperlipidemia   . CAD (coronary artery disease)     s/p 7 vessel CABG 2000 at [facility]    Past Surgical History  Procedure Date  . Coronary artery bypass graft   . Cardiac catheterization   . 4v cabg   . Patent grafts   . S/p 7     Current Outpatient Prescriptions  Medication Sig Dispense Refill  . aspirin 81 MG tablet Take 81 mg by mouth daily.        .  carvedilol (COREG) 3.125 MG tablet TAKE ONE TABLET BY MOUTH TWICE DAILY  60 tablet  6  . lisinopril (PRINIVIL,ZESTRIL) 5 MG tablet Take 15 mg by mouth 3 (three) times daily. 5mg  tabs  Take 3 tabs  1 time a day      . NITROSTAT 0.4 MG SL tablet DISSOLVE ONE TABLET UNDER THE TONGUE AT ONSET OF CHEST PAIN; YOU MAY REPEAT EVERY 5 MINUTES FOR UP TO 3 DOSES.  25 each  8  . rosuvastatin (CRESTOR) 10 MG tablet Take 10 mg by mouth daily.        Marland Kitchen DISCONTD: lisinopril (PRINIVIL,ZESTRIL) 20 MG tablet TAKE ONE TABLET BY MOUTH EVERY DAY  30 tablet  4  . DISCONTD: lisinopril (PRINIVIL,ZESTRIL) 5 MG tablet Take 1 tablet (5 mg total) by mouth 3 (three) times daily. 5mg  tabs  Take 3 tabs  1 time a day  90 tablet  5    No Known Allergies  History   Social History  . Marital Status: Married    Spouse Name: N/A    Number of Children: 3  . Years of Education: N/A   Occupational History  .     Social History Main Topics  . Smoking status: Former Smoker  Quit date: 06/19/1998  . Smokeless tobacco: Not on file  . Alcohol Use: No  . Drug Use: No  . Sexually Active:    Other Topics Concern  . Not on file   Social History Narrative  . No narrative on file    Family History  Problem Relation Age of Onset  . Heart failure Mother   . Heart failure Father     Review of Systems:  As stated in the HPI and otherwise negative.   BP 130/76  Pulse 75  Ht 5\' 6"  (1.676 m)  Wt 129 lb (58.514 kg)  BMI 20.82 kg/m2  Physical Examination: General: Well developed, well nourished, NAD HEENT: OP clear, mucus membranes moist SKIN: warm, dry. No rashes. Neuro: No focal deficits Musculoskeletal: Muscle strength 5/5 all ext Psychiatric: Mood and affect normal Neck: No JVD, no carotid bruits, no thyromegaly, no lymphadenopathy. Lungs:Clear bilaterally, no wheezes, rhonci, crackles Cardiovascular: Regular rate and rhythm. No murmurs, gallops or rubs. Abdomen:Soft. Bowel sounds present. Non-tender.    Extremities: No lower extremity edema. Pulses are 2 + in the bilateral DP/PT.  EKG: NSR, rate 65 bpm.   Assessment and Plan:   1. CAD: Stable post 7 vessel bypass in 2000. Stress echo in October 2011 without ischemia. He is doing well. Continue current therapy which includes ASA, beta blocker and statin. He has a dry cough. Will d/c Lisinopril and will start Cozaar 50 mg po QDaily.  Will check fasting lipids and LFTs. BP well controlled.

## 2012-04-12 ENCOUNTER — Emergency Department: Payer: Self-pay | Admitting: Emergency Medicine

## 2012-04-16 ENCOUNTER — Other Ambulatory Visit: Payer: Self-pay

## 2012-04-16 NOTE — Addendum Note (Signed)
Addended by: Reine Just on: 04/16/2012 05:20 PM   Modules accepted: Orders

## 2012-04-22 ENCOUNTER — Other Ambulatory Visit (INDEPENDENT_AMBULATORY_CARE_PROVIDER_SITE_OTHER): Payer: Self-pay

## 2012-04-22 DIAGNOSIS — I25810 Atherosclerosis of coronary artery bypass graft(s) without angina pectoris: Secondary | ICD-10-CM

## 2012-04-22 LAB — LIPID PANEL
Cholesterol: 149 mg/dL (ref 0–200)
HDL: 42.1 mg/dL (ref >39.00)
VLDL: 25.2 mg/dL (ref 0.0–40.0)

## 2012-04-22 LAB — HEPATIC FUNCTION PANEL: Albumin: 4.4 g/dL (ref 3.5–5.2)

## 2012-07-17 ENCOUNTER — Encounter (HOSPITAL_COMMUNITY): Payer: Self-pay | Admitting: *Deleted

## 2012-07-17 ENCOUNTER — Emergency Department (HOSPITAL_COMMUNITY)
Admission: EM | Admit: 2012-07-17 | Discharge: 2012-07-17 | Disposition: A | Discharge status: Home / Self Care | Payer: Self-pay | Attending: Emergency Medicine | Admitting: Emergency Medicine

## 2012-07-17 DIAGNOSIS — Z79899 Other long term (current) drug therapy: Secondary | ICD-10-CM | POA: Insufficient documentation

## 2012-07-17 DIAGNOSIS — I10 Essential (primary) hypertension: Secondary | ICD-10-CM | POA: Insufficient documentation

## 2012-07-17 DIAGNOSIS — E7800 Pure hypercholesterolemia, unspecified: Secondary | ICD-10-CM | POA: Insufficient documentation

## 2012-07-17 DIAGNOSIS — E785 Hyperlipidemia, unspecified: Secondary | ICD-10-CM | POA: Insufficient documentation

## 2012-07-17 DIAGNOSIS — K089 Disorder of teeth and supporting structures, unspecified: Secondary | ICD-10-CM | POA: Insufficient documentation

## 2012-07-17 DIAGNOSIS — Z7982 Long term (current) use of aspirin: Secondary | ICD-10-CM | POA: Insufficient documentation

## 2012-07-17 DIAGNOSIS — K0889 Other specified disorders of teeth and supporting structures: Secondary | ICD-10-CM

## 2012-07-17 DIAGNOSIS — Z87891 Personal history of nicotine dependence: Secondary | ICD-10-CM | POA: Insufficient documentation

## 2012-07-17 DIAGNOSIS — Z8679 Personal history of other diseases of the circulatory system: Secondary | ICD-10-CM | POA: Insufficient documentation

## 2012-07-17 HISTORY — DX: Pure hypercholesterolemia, unspecified: E78.00

## 2012-07-17 MED ORDER — HYDROCODONE-ACETAMINOPHEN 5-325 MG PO TABS: 2.0000 | ORAL_TABLET | ORAL | Status: DC | PRN

## 2012-07-17 MED ORDER — PENICILLIN V POTASSIUM 500 MG PO TABS: 500.0000 mg | ORAL_TABLET | Freq: Three times a day (TID) | ORAL | Status: DC

## 2012-07-17 MED ORDER — HYDROCODONE-ACETAMINOPHEN 5-325 MG PO TABS
2.0000 | ORAL_TABLET | Freq: Once | ORAL | Status: DC
  Filled 2012-07-17: qty 2

## 2012-07-17 NOTE — ED Notes (Signed)
Pt drove self here & is by himself therefore pain med not given

## 2012-07-17 NOTE — ED Provider Notes (Signed)
History     CSN: [id number]  Arrival date & time 07/17/12  0820   First MD Initiated Contact with Patient 07/17/12 [phone]      Chief Complaint  Patient presents with  . Dental Pain    (Consider location/radiation/quality/duration/timing/severity/associated sxs/prior treatment) HPI Comments: The patient is a 57 year old otherwise healthy male who presents with dental pain that started gradually 3 month ago. The dental pain is severe, constant and progressively worsening. The pain is aching and located in left lower jaw. The pain does not radiate. Eating makes the pain worse. Nothing makes the pain better. The patient has not tried anything for pain. No associated symptoms. Patient denies headache, neck pain/stiffness, fever, NVD, edema, sore throat, throat swelling, wheezing, SOB, chest pain, abdominal pain.      Past Medical History  Diagnosis Date  . Hypertension   . Hyperlipidemia   . CAD (coronary artery disease)     s/p 7 vessel CABG 2000 at [facility]  . High cholesterol     Past Surgical History  Procedure Date  . Coronary artery bypass graft   . Cardiac catheterization   . 4v cabg   . Patent grafts   . S/p 7     Family History  Problem Relation Age of Onset  . Heart failure Mother   . Heart failure Father     History  Substance Use Topics  . Smoking status: Former Smoker    Quit date: 06/19/1998  . Smokeless tobacco: Not on file  . Alcohol Use: No      Review of Systems  HENT: Positive for dental problem.   All other systems reviewed and are negative.    Allergies  Review of patient's allergies indicates no known allergies.  Home Medications   Current Outpatient Rx  Name  Route  Sig  Dispense  Refill  . ACETAMINOPHEN 500 MG PO TABS   Oral   Take 500 mg by mouth every 6 (six) hours as needed. For pain         . ASPIRIN 81 MG PO TABS   Oral   Take 81 mg by mouth daily.          Marland Kitchen CARVEDILOL 3.125 MG PO TABS   Oral   Take 1 tablet (3.125 mg  total) by mouth 2 (two) times daily with a meal.   60 tablet   11   . IBUPROFEN 200 MG PO TABS   Oral   Take 600 mg by mouth every 6 (six) hours as needed. For pain         . LISINOPRIL 10 MG PO TABS   Oral   Take 10 mg by mouth daily.         Marland Kitchen NITROGLYCERIN 0.4 MG SL SUBL   Sublingual   Place 0.4 mg under the tongue every 5 (five) minutes as needed. Chest pains         . ROSUVASTATIN CALCIUM 10 MG PO TABS   Oral   Take 10 mg by mouth daily.           BP 117/79  Pulse 70  Temp 97.5 F (36.4 C) (Oral)  Resp 18  SpO2 98%  Physical Exam  Nursing note and vitals reviewed. Constitutional: He is oriented to person, place, and time. He appears well-developed and well-nourished. No distress.  HENT:  Head: Normocephalic and atraumatic.  Mouth/Throat: Oropharynx is clear and moist.       Poor dentition with multiple decayed  teeth.   Eyes: Conjunctivae normal are normal.  Neck: Normal range of motion.  Cardiovascular: Normal rate and regular rhythm.  Exam reveals no gallop and no friction rub.   No murmur heard. Pulmonary/Chest: Effort normal and breath sounds normal. He has no wheezes. He has no rales. He exhibits no tenderness.  Abdominal: Soft. There is no tenderness.  Musculoskeletal: Normal range of motion.  Neurological: He is alert and oriented to person, place, and time.       Speech is goal-oriented. Moves limbs without ataxia.   Skin: Skin is warm and dry.  Psychiatric: He has a normal mood and affect. His behavior is normal.    ED Course  Procedures (including critical care time)  Labs Reviewed - No data to display No results found.   1. Pain, dental       MDM  8:50 AM Patient will have vicodin and pen vk for dental pain. Patient instructed to follow up with dentist through [facility] within 48 hours of being seen here. Patient afebrile. Patient instructed to return with worsening or concerning symptoms.        Emilia Beck, [place] 07/17/12  [phone]

## 2012-07-17 NOTE — ED Notes (Signed)
C/o toothache x 3 months

## 2012-07-17 NOTE — ED Notes (Signed)
Reports seen at [name] ED 2 months ago for same. Referred to clinic at [facility] but did not go. Taking OTC tylenol & ibuprofen with little relief. Denies injury, fever. No visible edema noted.

## 2012-07-19 NOTE — ED Provider Notes (Signed)
Medical screening examination/treatment/procedure(s) were performed by non-physician practitioner and as supervising physician I was immediately available for consultation/collaboration.  Toy Baker, MD 07/19/12 [phone]

## 2012-09-05 ENCOUNTER — Telehealth: Payer: Self-pay | Admitting: Cardiovascular Disease

## 2012-09-08 NOTE — Telephone Encounter (Signed)
Received refill request from [facility] for Lisinopril 20 mg daily. Our records indicate Lisinopril was stopped at office visit with Dr. [name] on April 10, 2012 and pt was started on Cozaar.  I tried to call pt on mobile number listed for him to clarify his medications but this number is not a working number. I left message with person answering home number to have pt call our office.

## 2012-09-09 NOTE — Telephone Encounter (Signed)
Follow-up: ° ° ° °Patient called in returning Pat's call.  Please call back. °

## 2012-09-09 NOTE — Telephone Encounter (Signed)
Called patient concerning issue of refilling Lisinopril. He states that he never changed medication to Cozaar. No complaints of dry cough anymore. He would like to stay on Lisinopril. Will send refills to [facility] in [place]

## 2012-09-11 ENCOUNTER — Other Ambulatory Visit: Payer: Self-pay | Admitting: *Deleted

## 2013-03-04 ENCOUNTER — Encounter: Payer: Self-pay | Admitting: Cardiovascular Disease

## 2013-03-04 ENCOUNTER — Ambulatory Visit (INDEPENDENT_AMBULATORY_CARE_PROVIDER_SITE_OTHER): Payer: Self-pay | Admitting: Cardiovascular Disease

## 2013-03-04 VITALS — BP 126/80 | HR 65 | Ht 68.0 in | Wt 130.0 lb

## 2013-03-04 DIAGNOSIS — I2510 Atherosclerotic heart disease of native coronary artery without angina pectoris: Secondary | ICD-10-CM

## 2013-03-04 NOTE — Patient Instructions (Addendum)
Your physician wants you to follow-up in:  12 months.  You will receive a reminder letter in the mail two months in advance. If you don't receive a letter, please call our office to schedule the follow-up appointment.   

## 2013-03-04 NOTE — Progress Notes (Signed)
History of Present Illness: 57 yo [place] male with h/o CAD s/p 7V CABG in 2000 at [facility], HTN, hyperlipidemia here today for follow up. He was seen as a new pt in January 2011 with complaints of chest pain. He had been followed by Dr. Juliann Pares in [place] and has had yearly stress tests in that office. Cardiac cath 07/21/09 showed 5/7 patent bypass grafts but good flow into both diagonals (the targets of the occluded grafts). Stress echo 10/11:  He exercised for 14 minutes with no chest pain and no EKG changes suggestive of ischemia. His echo showed septal, apical and inferobasal hypokinesis with mild worsening of the septal hypokinesis with exercise. His chest pain was mostly at rest. We started Imdur at that time.   He is here today for follow up.  He has daily chest pains which are described as spiders on his chest. No changes in character or duration over last year. He has no SOB. 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 He has an autistic son who is 37 yo. His wife has meningitis and is at [facility].   Primary Care Physician: None  Last Lipid Profile:Lipid Panel     Component Value Date/Time   CHOL 149 04/22/2012 0844   TRIG 126.0 04/22/2012 0844   HDL 42.10 04/22/2012 0844   CHOLHDL 4 04/22/2012 0844   VLDL 25.2 04/22/2012 0844   LDLCALC 82 04/22/2012 0844     Past Medical History  Diagnosis Date  . Hypertension   . Hyperlipidemia   . CAD (coronary artery disease)     s/p 7 vessel CABG 2000 at [facility]  . High cholesterol     Past Surgical History  Procedure Laterality Date  . Coronary artery bypass graft    . Cardiac catheterization    . 4v cabg    . Patent grafts    . S/p 7      Current Outpatient Prescriptions  Medication Sig Dispense Refill  . aspirin 81 MG tablet Take 81 mg by mouth daily.       . carvedilol (COREG) 3.125 MG tablet Take 1 tablet (3.125 mg total) by mouth 2 (two) times daily with a meal.  60 tablet  11  .  lisinopril (PRINIVIL,ZESTRIL) 20 MG tablet TAKE ONE TABLET BY MOUTH EVERY DAY  30 tablet  6  . nitroGLYCERIN (NITROSTAT) 0.4 MG SL tablet Place 0.4 mg under the tongue every 5 (five) minutes as needed. Chest pains      . rosuvastatin (CRESTOR) 10 MG tablet Take 10 mg by mouth daily.       No current facility-administered medications for this visit.    No Known Allergies  History   Social History  . Marital Status: Divorced    Spouse Name: N/A    Number of Children: 3  . Years of Education: N/A   Occupational History  .     Social History Main Topics  . Smoking status: Former Smoker    Quit date: 06/19/1998  . Smokeless tobacco: Not on file  . Alcohol Use: No  . Drug Use: No  . Sexual Activity:    Other Topics Concern  . Not on file   Social History Narrative  . No narrative on file    Family History  Problem Relation Age of Onset  . Heart failure Mother   . Heart failure Father     Review of Systems:  As stated in the  HPI and otherwise negative.   BP 126/80  Pulse 65  Ht 5\' 8"  (1.727 m)  Wt 130 lb (58.968 kg)  BMI 19.77 kg/m2  Physical Examination: General: Well developed, well nourished, NAD HEENT: OP clear, mucus membranes moist SKIN: warm, dry. No rashes. Neuro: No focal deficits Musculoskeletal: Muscle strength 5/5 all ext Psychiatric: Mood and affect normal Neck: No JVD, no carotid bruits, no thyromegaly, no lymphadenopathy. Lungs:Clear bilaterally, no wheezes, rhonci, crackles Cardiovascular: Regular rate and rhythm. No murmurs, gallops or rubs. Abdomen:Soft. Bowel sounds present. Non-tender.  Extremities: No lower extremity edema. Pulses are 2 + in the bilateral DP/PT.  EKG: NSR, rate 67 bpm.   Assessment and Plan:   1. CAD: Stable post 7 vessel bypass in 2000. Stress echo in October 2011 without ischemia. He is doing well. Continue current therapy which includes ASA, beta blocker, ARB and statin.  Lipids well controlled. BP well controlled.

## 2013-03-04 NOTE — Progress Notes (Signed)
  Entered in error. See other note. cdm

## 2013-04-21 ENCOUNTER — Other Ambulatory Visit: Payer: Self-pay | Admitting: Cardiovascular Disease

## 2013-04-29 ENCOUNTER — Ambulatory Visit: Payer: Self-pay | Admitting: Cardiovascular Disease

## 2013-05-09 ENCOUNTER — Other Ambulatory Visit: Payer: Self-pay | Admitting: Family Medicine

## 2013-05-11 NOTE — Telephone Encounter (Signed)
Let him know that I did call his medicine and tell him to not take that and the nitroglycerin at the same time

## 2013-05-11 NOTE — Telephone Encounter (Signed)
Is this ok?

## 2013-05-11 NOTE — Telephone Encounter (Signed)
LEFT MESSAGE FOR PT WORD FOR WORD ON MOBILE PHONE

## 2013-05-11 NOTE — Telephone Encounter (Signed)
Patient instructed to not use nitroglycerin with this medication.

## 2013-05-12 ENCOUNTER — Other Ambulatory Visit: Payer: Self-pay | Admitting: Cardiovascular Disease

## 2013-06-20 ENCOUNTER — Other Ambulatory Visit: Payer: Self-pay | Admitting: Cardiovascular Disease

## 2013-06-20 ENCOUNTER — Telehealth: Payer: Self-pay | Admitting: Physician Assistant

## 2013-06-20 MED ORDER — NITROGLYCERIN 0.4 MG SL SUBL: 0.4000 mg | SUBLINGUAL_TABLET | SUBLINGUAL | Status: DC | PRN

## 2013-06-20 NOTE — Telephone Encounter (Signed)
Patient called in after hours on Saturday for refill. He noticed NTG was expired. He wants to have this on hand in case he needs it. He takes it on occasion with relief of CP, every few months. No recent increase in pain/dosing. He states he is doing well. I informed him that we don't typically refill on weekends but I offered to send in script. I also noted Viagra to be on his med list and educated him not to take either of these meds within 24 hrs of each other. He stated he no longer takes Viagra but did verbalize understanding of the importance of this. The patient states he has scheduled f/u with McAlhany. Kaydn Kumpf PA-C

## 2013-06-22 NOTE — Telephone Encounter (Signed)
Can we make sure this patient has his refill? Thanks, chris

## 2013-08-04 ENCOUNTER — Encounter: Payer: Self-pay | Admitting: Family Medicine

## 2013-08-04 ENCOUNTER — Ambulatory Visit (INDEPENDENT_AMBULATORY_CARE_PROVIDER_SITE_OTHER): Payer: 59 | Admitting: Family Medicine

## 2013-08-04 VITALS — BP 110/72 | HR 68 | Temp 97.8°F | Wt 130.0 lb

## 2013-08-04 DIAGNOSIS — I10 Essential (primary) hypertension: Secondary | ICD-10-CM

## 2013-08-04 DIAGNOSIS — E785 Hyperlipidemia, unspecified: Secondary | ICD-10-CM

## 2013-08-04 DIAGNOSIS — Z79899 Other long term (current) drug therapy: Secondary | ICD-10-CM

## 2013-08-04 DIAGNOSIS — I2510 Atherosclerotic heart disease of native coronary artery without angina pectoris: Secondary | ICD-10-CM

## 2013-08-04 DIAGNOSIS — N529 Male erectile dysfunction, unspecified: Secondary | ICD-10-CM

## 2013-08-04 LAB — CBC WITH DIFFERENTIAL/PLATELET
Basophils Absolute: 0 10*3/uL (ref 0.0–0.1)
Basophils Relative: 0 % (ref 0–1)
Eosinophils Absolute: 0.2 10*3/uL (ref 0.0–0.7)
Eosinophils Relative: 3 % (ref 0–5)
HCT: 40.8 % (ref 39.0–52.0)
HEMOGLOBIN: 14.5 g/dL (ref 13.0–17.0)
LYMPHS ABS: 2.7 10*3/uL (ref 0.7–4.0)
LYMPHS PCT: 38 % (ref 12–46)
MCH: 31.1 pg (ref 26.0–34.0)
MCHC: 35.5 g/dL (ref 30.0–36.0)
MCV: 87.6 fL (ref 78.0–100.0)
MONOS PCT: 11 % (ref 3–12)
Monocytes Absolute: 0.7 10*3/uL (ref 0.1–1.0)
NEUTROS PCT: 48 % (ref 43–77)
Neutro Abs: 3.4 10*3/uL (ref 1.7–7.7)
PLATELETS: 320 10*3/uL (ref 150–400)
RBC: 4.66 MIL/uL (ref 4.22–5.81)
RDW: 13.4 % (ref 11.5–15.5)
WBC: 7 10*3/uL (ref 4.0–10.5)

## 2013-08-04 LAB — COMPREHENSIVE METABOLIC PANEL
ALBUMIN: 4.3 g/dL (ref 3.5–5.2)
ALT: 18 U/L (ref 0–53)
AST: 24 U/L (ref 0–37)
Alkaline Phosphatase: 52 U/L (ref 39–117)
BUN: 10 mg/dL (ref 6–23)
CALCIUM: 9.4 mg/dL (ref 8.4–10.5)
CHLORIDE: 99 meq/L (ref 96–112)
CO2: 28 meq/L (ref 19–32)
Creat: 0.72 mg/dL (ref 0.50–1.35)
Glucose, Bld: 92 mg/dL (ref 70–99)
Potassium: 4.2 mEq/L (ref 3.5–5.3)
SODIUM: 135 meq/L (ref 135–145)
TOTAL PROTEIN: 6.9 g/dL (ref 6.0–8.3)
Total Bilirubin: 0.4 mg/dL (ref 0.2–1.2)

## 2013-08-04 LAB — LIPID PANEL
Cholesterol: 160 mg/dL (ref 0–200)
HDL: 36 mg/dL — AB (ref >39)
LDL CALC: 72 mg/dL (ref 0–99)
Total CHOL/HDL Ratio: 4.4 Ratio
Triglycerides: 262 mg/dL — ABNORMAL HIGH (ref <150)
VLDL: 52 mg/dL — ABNORMAL HIGH (ref 0–40)

## 2013-08-04 MED ORDER — ROSUVASTATIN CALCIUM 10 MG PO TABS: 10.0000 mg | ORAL_TABLET | Freq: Every day | ORAL | Status: DC

## 2013-08-04 MED ORDER — SILDENAFIL CITRATE 100 MG PO TABS: ORAL_TABLET | ORAL | Status: DC

## 2013-08-04 NOTE — Progress Notes (Signed)
   Subjective:    Patient ID: [name] Sosa, male    DOB: 05/21/1956, 58 y.o.   MRN: [id number]  HPI He is here after a several year absence to mainly due to lack of insurance. He did continue to followup with his cardiologist. That note was reviewed. He does need a refill on his Crestor. He continues on other medications listed in the chart. His family and social history were reviewed. He would like a refill on Viagra. He has had intermittent difficulty with chest pain and as mentioned above the note from cardiology was reviewed. Social and family history was reviewed.   Review of Systems     Objective:   Physical Exam alert and in no distress. Tympanic membranes and canals are normal. Throat is clear. Tonsils are normal. Neck is supple without adenopathy or thyromegaly. Cardiac exam shows a regular sinus rhythm without murmurs or gallops. Lungs are clear to auscultation. There is a healing lesion present on the bridge of the nose.        Assessment & Plan:  CAD (coronary artery disease) - Plan: CBC with Differential, Comprehensive metabolic panel, Lipid panel  HYPERLIPIDEMIA-MIXED - Plan: rosuvastatin (CRESTOR) 10 MG tablet, Lipid panel  HYPERTENSION, BENIGN - Plan: CBC with Differential, Comprehensive metabolic panel  ED (erectile dysfunction) - Plan: sildenafil (VIAGRA) 100 MG tablet  Encounter for long-term (current) use of other medications - Plan: CBC with Differential, Comprehensive metabolic panel, Lipid panel  discussed use of Viagra and his nitroglycerin and strongly encouraged him to not use both at the same time. He is aware of this and expressed knowledge of it. Return here in 6 months or sooner if problems.

## 2013-08-04 NOTE — Patient Instructions (Signed)
Do not use the Viagra and nitroglycerin at the same time

## 2013-11-29 ENCOUNTER — Other Ambulatory Visit: Payer: Self-pay | Admitting: Cardiovascular Disease

## 2013-12-29 ENCOUNTER — Telehealth: Payer: Self-pay | Admitting: Cardiovascular Disease

## 2013-12-29 NOTE — Telephone Encounter (Signed)
**Note De-identified [name] Sosa Obfuscation** LMTCB

## 2013-12-29 NOTE — Telephone Encounter (Signed)
New message    Patient calling stating he would like to be set up for cath  - how would he go about doing that.

## 2014-01-01 NOTE — Telephone Encounter (Signed)
Spoke w/pt.  States he would like to be set up for cath.  States he has been having CP off and on for past few months. Has not had to take NTG.  States that he doesn't feel like it is urgent. Has not had a stress test in several years.  Advised that Dr. Clifton JamesMcAlhany not in office but will send him a message

## 2014-01-04 NOTE — Telephone Encounter (Signed)
Pat, Can we call him and get him in for an appointment to see me or one of the office PA/NPs this week or next week? Thanks, chris

## 2014-01-05 NOTE — Telephone Encounter (Signed)
Spoke with pt and appointment made for him to see Dr. Clifton JamesMcAlhany on January 07, 2014 at 3:45.

## 2014-01-07 ENCOUNTER — Ambulatory Visit (INDEPENDENT_AMBULATORY_CARE_PROVIDER_SITE_OTHER): Payer: 59 | Admitting: Cardiovascular Disease

## 2014-01-07 ENCOUNTER — Encounter: Payer: Self-pay | Admitting: Cardiovascular Disease

## 2014-01-07 VITALS — BP 134/79 | HR 64 | Ht 68.0 in | Wt 130.6 lb

## 2014-01-07 DIAGNOSIS — I25110 Atherosclerotic heart disease of native coronary artery with unstable angina pectoris: Secondary | ICD-10-CM

## 2014-01-07 DIAGNOSIS — I200 Unstable angina: Secondary | ICD-10-CM

## 2014-01-07 DIAGNOSIS — I2510 Atherosclerotic heart disease of native coronary artery without angina pectoris: Secondary | ICD-10-CM

## 2014-01-07 NOTE — Patient Instructions (Signed)
Your physician recommends that you schedule a follow-up appointment in:  6-8 weeks.   Your physician has requested that you have an exercise stress myoview. For further information please visit [email or web address]. Please follow instruction sheet, as given.  

## 2014-01-07 NOTE — Progress Notes (Signed)
History of Present Illness: 58 yo [place] male with h/o CAD s/p 7V CABG in 2000 at [facility], HTN, hyperlipidemia here today for follow up. He was seen as a new pt in January 2011 with complaints of chest pain. He had been followed by Dr. Juliann Pares in [place] and had yearly stress tests in that office. Cardiac cath 07/21/09 showed 5/7 patent bypass grafts but good flow into both diagonals (the targets of the occluded grafts). Stress echo October 2011 with great exercise tolerance (14 minutes with no chest pain and no EKG changes suggestive of ischemia). His echo showed septal, apical and inferobasal hypokinesis with mild worsening of the septal hypokinesis with exercise. His chest pain was mostly at rest. We started Imdur at that time.   He is here today for follow up.  He has daily chest pains which are described as spiders on his chest. No changes in character or duration over last year. One episode of chest pain last week while walking. Has mostly been at rest. He has no SOB. 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 He has an autistic son who is 47 yo. His wife has meningitis and is at [facility].   Primary Care Physician: None  Last Lipid Profile:Lipid Panel     Component Value Date/Time   CHOL 160 08/04/2013 1447   TRIG 262* 08/04/2013 1447   HDL 36* 08/04/2013 1447   CHOLHDL 4.4 08/04/2013 1447   VLDL 52* 08/04/2013 1447   LDLCALC 72 08/04/2013 1447     Past Medical History  Diagnosis Date  . Hypertension   . Hyperlipidemia   . CAD (coronary artery disease)     s/p 7 vessel CABG 2000 at [facility]  . High cholesterol     Past Surgical History  Procedure Laterality Date  . Coronary artery bypass graft    . Cardiac catheterization    . 4v cabg    . Patent grafts    . S/p 7      Current Outpatient Prescriptions  Medication Sig Dispense Refill  . aspirin 81 MG tablet Take 81 mg by mouth daily.       . carvedilol (COREG) 3.125 MG tablet  TAKE ONE TABLET BY MOUTH TWICE DAILY WITH A MEAL  60 tablet  2  . lisinopril (PRINIVIL,ZESTRIL) 20 MG tablet TAKE ONE TABLET BY MOUTH ONCE DAILY  30 tablet  10  . nitroGLYCERIN (NITROSTAT) 0.4 MG SL tablet Place 1 tablet (0.4 mg total) under the tongue every 5 (five) minutes as needed for chest pain (up to 3 doses).  25 tablet  1  . rosuvastatin (CRESTOR) 10 MG tablet Take 1 tablet (10 mg total) by mouth daily.  90 tablet  3  . sildenafil (VIAGRA) 100 MG tablet TAKE 1 TABLET BY MOUTH ONCE A DAY AS NEEDED  6 tablet  PRN   No current facility-administered medications for this visit.    No Known Allergies  History   Social History  . Marital Status: Divorced    Spouse Name: N/A    Number of Children: 3  . Years of Education: N/A   Occupational History  .     Social History Main Topics  . Smoking status: Former Smoker    Quit date: 06/19/1998  . Smokeless tobacco: Not on file  . Alcohol Use: No  . Drug Use: No  . Sexual Activity: Yes   Other Topics Concern  . Not on file  Social History Narrative  . No narrative on file    Family History  Problem Relation Age of Onset  . Heart failure Mother   . Heart failure Father     Review of Systems:  As stated in the HPI and otherwise negative.   BP 134/79  Pulse 64  Ht 5\' 8"  (1.727 m)  Wt 130 lb 9.6 oz (59.24 kg)  BMI 19.86 kg/m2  Physical Examination: General: Well developed, well nourished, NAD HEENT: OP clear, mucus membranes moist SKIN: warm, dry. No rashes. Neuro: No focal deficits Musculoskeletal: Muscle strength 5/5 all ext Psychiatric: Mood and affect normal Neck: No JVD, no carotid bruits, no thyromegaly, no lymphadenopathy. Lungs:Clear bilaterally, no wheezes, rhonci, crackles Cardiovascular: Regular rate and rhythm. No murmurs, gallops or rubs. Abdomen:Soft. Bowel sounds present. Non-tender.  Extremities: No lower extremity edema. Pulses are 2 + in the bilateral DP/PT.  EKG: NSR, rate 64 bpm. Old inferior  MI  Assessment and Plan:   1. CAD: Recent chest pains. Known CAD. Will arrange exercise stress myoview to exclude ischemia. Continue current therapy which includes ASA, beta blocker, ARB and statin.    2. HTN: BP stable. No changes.   3. HLD: On a statin. Lipids well controlled.

## 2014-01-12 ENCOUNTER — Encounter: Payer: Self-pay | Admitting: Cardiovascular Disease

## 2014-01-14 ENCOUNTER — Encounter (HOSPITAL_COMMUNITY): Payer: 59

## 2014-01-18 ENCOUNTER — Ambulatory Visit (HOSPITAL_COMMUNITY): Payer: 59 | Attending: Cardiology | Admitting: Radiology

## 2014-01-18 VITALS — BP 138/82 | Ht 68.0 in | Wt 128.0 lb

## 2014-01-18 DIAGNOSIS — I200 Unstable angina: Secondary | ICD-10-CM | POA: Insufficient documentation

## 2014-01-18 DIAGNOSIS — I2510 Atherosclerotic heart disease of native coronary artery without angina pectoris: Secondary | ICD-10-CM | POA: Insufficient documentation

## 2014-01-18 DIAGNOSIS — I25110 Atherosclerotic heart disease of native coronary artery with unstable angina pectoris: Secondary | ICD-10-CM

## 2014-01-18 DIAGNOSIS — R079 Chest pain, unspecified: Secondary | ICD-10-CM

## 2014-01-18 MED ORDER — TECHNETIUM TC 99M SESTAMIBI GENERIC - CARDIOLITE
30.0000 | Freq: Once | INTRAVENOUS | Status: AC | PRN
  Administered 2014-01-18: 30 via INTRAVENOUS

## 2014-01-18 MED ORDER — TECHNETIUM TC 99M SESTAMIBI GENERIC - CARDIOLITE
10.0000 | Freq: Once | INTRAVENOUS | Status: AC | PRN
  Administered 2014-01-18: 10 via INTRAVENOUS

## 2014-01-18 NOTE — Progress Notes (Signed)
[facility] SITE 3 NUCLEAR MED [address] , [place] [address] [phone]    Cardiology Nuclear Med Study  [name] PerchesSeyed Karnik is a 58 y.o. male     MRN : [id number]     DOB: 11/15/1955  Procedure Date: 01/18/2014  Nuclear Med Background Indication for Stress Test:  Evaluation for Ischemia and Graft Patency History:  CAD-CATH-GRAFTS-patent-5/7, '11 Stress ECHO: EF: 45-50%severe hypokinesis mid anteroseptal 2012 [facility] per pt Cardiac Risk Factors: History of Smoking, Hypertension and Lipids  Symptoms:  Chest Pain and Palpitations   Nuclear Pre-Procedure Caffeine/Decaff Intake:  None> 12 hrs NPO After: 9:00pm   Lungs:  clear O2 Sat: 99% on room air. IV 0.9% NS with Angio Cath:  22g  IV Site: R Antecubital x 1,. Tolerated well IV Started by:  Irean HongPatsy Edwards, RN  Chest Size (in):  40 Cup Size: n/a  Height: 5\' 8"  (1.727 m)  Weight:  128 lb (58.06 kg)  BMI:  Body mass index is 19.47 kg/(m^2). Tech Comments:  Patient took Lisinopril this am, and held Coreg x 36 hrs. Irean HongPatsy Edwards, RN.    Nuclear Med Study 1 or 2 day study: 1 day  Stress Test Type:  Stress  Reading MD: N/A  Order Authorizing Provider:  Verne Carrowhristopher McAlhany, MD  Resting Radionuclide: Technetium 6771m Sestamibi  Resting Radionuclide Dose: 11.0 mCi   Stress Radionuclide:  Technetium 2071m Sestamibi  Stress Radionuclide Dose: 33.0 mCi           Stress Protocol Rest HR: 57 Stress HR: 146  Rest BP: 138/82 Stress BP: 192/92  Exercise Time (min): 12:30 METS: 14.30   Predicted Max HR: 163 bpm % Max HR: 89.57 bpm Rate Pressure Product: [id number]   Dose of Adenosine (mg):  n/a Dose of Lexiscan: n/a mg  Dose of Atropine (mg): n/a Dose of Dobutamine: n/a mcg/kg/min (at max HR)  Stress Test Technologist: Milana NaSabrina Williams, EMT-P  Nuclear Technologist:  Harlow AsaElizabeth Young, CNMT     Rest Procedure:  Myocardial perfusion imaging was performed at rest 45 minutes following the intravenous  administration of Technetium 3871m Sestamibi. Rest ECG: NSR with non-specific ST-T wave changes  Stress Procedure:  The patient exercised on the treadmill utilizing the Bruce Protocol for 12:30 minutes. The patient stopped due to fatigue and denied any chest pain.  Technetium 5971m Sestamibi was injected at peak exercise and myocardial perfusion imaging was performed after a brief delay. Stress ECG: No significant change from baseline ECG  QPS Raw Data Images:  Patient motion noted. Stress Images:  There is decreased uptake in the anterior wall. Rest Images:  There is decreased uptake in the anterior wall. Subtraction (SDS):  There is a fixed defect that is most consistent with a previous infarction. Transient Ischemic Dilatation (Normal <1.22):  0.89 Lung/Heart Ratio (Normal <0.45):  0.34  Quantitative Gated Spect Images QGS EDV:  109 ml QGS ESV:  57 ml  Impression Exercise Capacity:  Good exercise capacity. BP Response:  Hypertensive blood pressure response. Clinical Symptoms:  No significant symptoms noted. ECG Impression:  No significant ST segment change suggestive of ischemia. 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  LV Ejection Fraction: 48%.  LV Wall Motion:  Apical hypokinesis    Charlton HawsPeter Sherrel Shafer

## 2014-02-01 ENCOUNTER — Encounter: Payer: Self-pay | Admitting: Family Medicine

## 2014-02-01 ENCOUNTER — Ambulatory Visit (INDEPENDENT_AMBULATORY_CARE_PROVIDER_SITE_OTHER): Payer: 59 | Admitting: Family Medicine

## 2014-02-01 VITALS — BP 110/70 | HR 68 | Ht 65.0 in | Wt 132.0 lb

## 2014-02-01 DIAGNOSIS — R351 Nocturia: Secondary | ICD-10-CM

## 2014-02-01 DIAGNOSIS — M5430 Sciatica, unspecified side: Secondary | ICD-10-CM

## 2014-02-01 DIAGNOSIS — Z86010 Personal history of colonic polyps: Secondary | ICD-10-CM

## 2014-02-01 DIAGNOSIS — I10 Essential (primary) hypertension: Secondary | ICD-10-CM

## 2014-02-01 DIAGNOSIS — M5431 Sciatica, right side: Secondary | ICD-10-CM

## 2014-02-01 DIAGNOSIS — I25810 Atherosclerosis of coronary artery bypass graft(s) without angina pectoris: Secondary | ICD-10-CM

## 2014-02-01 DIAGNOSIS — I25709 Atherosclerosis of coronary artery bypass graft(s), unspecified, with unspecified angina pectoris: Secondary | ICD-10-CM

## 2014-02-01 DIAGNOSIS — I209 Angina pectoris, unspecified: Secondary | ICD-10-CM

## 2014-02-01 DIAGNOSIS — R079 Chest pain, unspecified: Secondary | ICD-10-CM

## 2014-02-01 NOTE — Progress Notes (Signed)
   Subjective:    Patient ID: [name] PerchesSeyed Fedora, male    DOB: 03/12/1956, 58 y.o.   MRN: [id number]  HPI He is here for multiple issues. He recently had a stress test for evaluation of continued difficulty with chest pain. The test showed low probability. He has a 3-5 year history of a prickling sensation in his chest that last for several minutes and then goes away. No associated chest redness, diaphoresis, weakness. He cannot relate this to food, position, stress or exercise. He also complains of a several year history of nocturia getting up 2-4 times per night. He notes that the urine is light yellow to clear when he goes to bed. He has no urgency, hesitancy, decreased stream or incontinence. He also complains of right hip pain stating he has difficulty lying on that side but no numbness, tingling or weakness. Review of record indicates that he needs to go for colonoscopy which she will set up. He apparently has history of colonic polyps.  Review of Systems     Objective:   Physical Exam alert and in no distress. Tympanic membranes and canals are normal. Throat is clear. Tonsils are normal. Neck is supple without adenopathy or thyromegaly. Cardiac exam shows a regular sinus rhythm without murmurs or gallops. Lungs are clear to auscultation. Exam of the right hip does show tenderness palpation over the sciatic notch. No tenderness over SI joint. Hip motion is normal. Negative straight leg raising. Normal DTRs.       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 Explained that it was more likely something noncardiac and for him to pay attention to and in the makes  this better or worse. Recommend he cut back on his fluids at night and make sure he empties his bladder before he goes to bed. Explained that I wanted his urine more dark yellow before going to bed. For the sciatic pain, recommend gentle stretching and if continued difficulty he will return here for referral to physical therapy. He will set up an appointment with gastroenterology for repeat colonoscopy.

## 2014-02-01 NOTE — Patient Instructions (Signed)
Cut back on your fluids at night so your urine is more yellow and make sure you at your bladder before you go to bed. Keep track of the chest symptoms and if there is a pattern let me know Do some hip stretching and if it keeps it up give  me a call

## 2014-03-15 ENCOUNTER — Other Ambulatory Visit: Payer: Self-pay | Admitting: Cardiovascular Disease

## 2014-03-16 ENCOUNTER — Ambulatory Visit: Payer: 59 | Admitting: Cardiovascular Disease

## 2014-04-20 ENCOUNTER — Other Ambulatory Visit: Payer: Self-pay

## 2014-04-20 MED ORDER — LISINOPRIL 20 MG PO TABS: ORAL_TABLET | ORAL | Status: DC

## 2014-04-29 ENCOUNTER — Other Ambulatory Visit: Payer: Self-pay | Admitting: Cardiovascular Disease

## 2014-05-28 ENCOUNTER — Encounter: Payer: Self-pay | Admitting: Cardiovascular Disease

## 2014-05-28 ENCOUNTER — Ambulatory Visit (INDEPENDENT_AMBULATORY_CARE_PROVIDER_SITE_OTHER): Payer: 59 | Admitting: Cardiovascular Disease

## 2014-05-28 VITALS — BP 102/68 | HR 62 | Ht 65.0 in | Wt 129.4 lb

## 2014-05-28 DIAGNOSIS — I2510 Atherosclerotic heart disease of native coronary artery without angina pectoris: Secondary | ICD-10-CM

## 2014-05-28 DIAGNOSIS — E785 Hyperlipidemia, unspecified: Secondary | ICD-10-CM

## 2014-05-28 DIAGNOSIS — I10 Essential (primary) hypertension: Secondary | ICD-10-CM

## 2014-05-28 NOTE — Patient Instructions (Signed)
Your physician wants you to follow-up in:  6 months. You will receive a reminder letter in the mail two months in advance. If you don't receive a letter, please call our office to schedule the follow-up appointment.   

## 2014-05-28 NOTE — Progress Notes (Signed)
History of Present Illness: 58 yo [place] male with h/o CAD s/p 7V CABG in 2000 at [facility], HTN, hyperlipidemia here today for follow up. He was seen as a new pt in January 2011 with complaints of chest pain. He had been followed by Dr. Juliann Paresallwood in [place] and had yearly stress tests in that office. Cardiac cath 07/21/09 showed 5/7 patent bypass grafts but good flow into both diagonals (the targets of the occluded grafts). Stress echo October 2011 with great exercise tolerance (14 minutes with no chest pain and no EKG changes suggestive of ischemia). His echo showed septal, apical and inferobasal hypokinesis with mild worsening of the septal hypokinesis with exercise. His chest pain was mostly at rest. We started Imdur at that time. Continued to describe daily chest pain with sensation of "spiders crawling over the chest wall". Stress myoview 01/18/14 with no ischemia, LVEF=48%.   He is here today for follow up.  Less chest pain. NO chest pain for last 6 weeks. He has no SOB. He works in Production designer, theatre/television/filmmaintenance. 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 Wife is still sick with lupus and meningitis.   Primary Care Physician: None  Last Lipid Profile:Lipid Panel     Component Value Date/Time   CHOL 160 08/04/2013 1447   TRIG 262* 08/04/2013 1447   HDL 36* 08/04/2013 1447   CHOLHDL 4.4 08/04/2013 1447   VLDL 52* 08/04/2013 1447   LDLCALC 72 08/04/2013 1447    Past Medical History  Diagnosis Date  . Hypertension   . Hyperlipidemia   . CAD (coronary artery disease)     [facility]  . High cholesterol     Past Surgical History  Procedure Laterality Date  . Coronary artery bypass graft    . Cardiac catheterization    . 4v cabg    . Patent grafts    . S/p 7      Current Outpatient Prescriptions  Medication Sig Dispense Refill  . aspirin 81 MG tablet Take 81 mg by mouth daily.     . carvedilol (COREG) 3.125 MG tablet TAKE ONE TABLET BY MOUTH TWICE  DAILY WITH MEALS 60 tablet 0  . lisinopril (PRINIVIL,ZESTRIL) 20 MG tablet TAKE ONE TABLET BY MOUTH ONCE DAILY 30 tablet 10  . nitroGLYCERIN (NITROSTAT) 0.4 MG SL tablet Place 1 tablet (0.4 mg total) under the tongue every 5 (five) minutes as needed for chest pain (up to 3 doses). 25 tablet 1  . rosuvastatin (CRESTOR) 10 MG tablet Take 1 tablet (10 mg total) by mouth daily. 90 tablet 3   No current facility-administered medications for this visit.    No Known Allergies  History   Social History  . Marital Status: Divorced    Spouse Name: N/A    Number of Children: 3  . Years of Education: N/A   Occupational History  .     Social History Main Topics  . Smoking status: Former Smoker    Quit date: 06/19/1998  . Smokeless tobacco: Not on file  . Alcohol Use: No  . Drug Use: No  . Sexual Activity: Yes   Other Topics Concern  . Not on file   Social History Narrative    Family History  Problem Relation Age of Onset  . Heart failure Mother   . Heart failure Father     Review of Systems:  As stated in the HPI and otherwise negative.   BP  102/68 mmHg  Pulse 62  Ht 5\' 5"  (1.651 m)  Wt 129 lb 6.4 oz (58.695 kg)  BMI 21.53 kg/m2  SpO2 99%  Physical Examination: General: Well developed, well nourished, NAD HEENT: OP clear, mucus membranes moist SKIN: warm, dry. No rashes. Neuro: No focal deficits Musculoskeletal: Muscle strength 5/5 all ext Psychiatric: Mood and affect normal Neck: No JVD, no carotid bruits, no thyromegaly, no lymphadenopathy. Lungs:Clear bilaterally, no wheezes, rhonci, crackles Cardiovascular: Regular rate and rhythm. No murmurs, gallops or rubs. Abdomen:Soft. Bowel sounds present. Non-tender.  Extremities: No lower extremity edema. Pulses are 2 + in the bilateral DP/PT.   Assessment and Plan:   1. CAD: Recent chest pains. Known CAD. Will arrange exercise stress myoview to exclude ischemia. Continue current therapy which includes ASA, beta  blocker, ARB and statin.    2. HTN: BP stable. No changes.   3. HLD: On a statin. Lipids well controlled.

## 2014-06-05 ENCOUNTER — Other Ambulatory Visit: Payer: Self-pay | Admitting: Cardiovascular Disease

## 2014-08-01 ENCOUNTER — Other Ambulatory Visit: Payer: Self-pay | Admitting: Physician Assistant

## 2014-08-01 ENCOUNTER — Telehealth: Payer: Self-pay | Admitting: Physician Assistant

## 2014-08-01 MED ORDER — NITROGLYCERIN 0.4 MG SL SUBL: 0.4000 mg | SUBLINGUAL_TABLET | SUBLINGUAL | Status: DC | PRN

## 2014-08-01 NOTE — Telephone Encounter (Addendum)
Pt called Sunday answering service requesting SL NTG refill. Says he has been taking this for chest pain. Chart reviewed. Dr. Clifton JamesMcAlhany ordered nuclear stress test in 05/2014 but I do not see this resulted. I asked him about this and the patient said "yeah, we've already been through all that." I told him I saw the stress test in August but noted that he never went on to have the one that Dr. Clifton JamesMcAlhany ordered in December. He then said if it's too much trouble to call in NTG then he would just call back tomorrow to talk to Dr. Clifton JamesMcAlhany. I reassured him I was just trying to get more information about the chest pain that prompts him to require NTG. He says he has had chronic angina for several years and it is completely unchanged lately. I attempted to talk to him more about it but he says there is no change, he just wants SL NTG called in. I told him I am happy to refill it but told him if symptoms are persistent or worsening then he needs to be evaluated for this with an upcoming appointment. He verbalized understanding and says he will call the office tomorrow. ER precautions reviewed but the patient sounded unimpressed with his symptoms. Dayna Dunn PA-C

## 2014-08-01 NOTE — Telephone Encounter (Signed)
Thanks. He has chronic chest pain and it has been stable for years. He generally lets me know if there is a change. Thayer Ohmhris

## 2014-12-28 ENCOUNTER — Other Ambulatory Visit: Payer: Self-pay | Admitting: Cardiovascular Disease

## 2015-03-11 ENCOUNTER — Encounter: Payer: Self-pay | Admitting: Cardiovascular Disease

## 2015-03-11 ENCOUNTER — Ambulatory Visit: Payer: Self-pay | Attending: Internal Medicine

## 2015-03-11 ENCOUNTER — Ambulatory Visit (INDEPENDENT_AMBULATORY_CARE_PROVIDER_SITE_OTHER): Payer: Self-pay | Admitting: Cardiovascular Disease

## 2015-03-11 VITALS — BP 120/72 | HR 60 | Ht 65.0 in | Wt 132.0 lb

## 2015-03-11 DIAGNOSIS — I2510 Atherosclerotic heart disease of native coronary artery without angina pectoris: Secondary | ICD-10-CM

## 2015-03-11 DIAGNOSIS — I10 Essential (primary) hypertension: Secondary | ICD-10-CM

## 2015-03-11 DIAGNOSIS — E785 Hyperlipidemia, unspecified: Secondary | ICD-10-CM

## 2015-03-11 MED ORDER — LISINOPRIL 20 MG PO TABS: ORAL_TABLET | ORAL | Status: DC

## 2015-03-11 MED ORDER — CARVEDILOL 3.125 MG PO TABS
3.1250 mg | ORAL_TABLET | Freq: Two times a day (BID) | ORAL | Status: DC
Start: 2015-03-11 — End: 2015-06-24

## 2015-03-11 MED ORDER — ROSUVASTATIN CALCIUM 10 MG PO TABS: 10.0000 mg | ORAL_TABLET | Freq: Every day | ORAL | Status: DC

## 2015-03-11 MED ORDER — NITROGLYCERIN 0.4 MG SL SUBL: 0.4000 mg | SUBLINGUAL_TABLET | SUBLINGUAL | Status: DC | PRN

## 2015-03-11 NOTE — Patient Instructions (Signed)
Medication Instructions:  Your physician recommends that you continue on your current medications as directed. Please refer to the Current Medication list given to you today.   Labwork: Your physician recommends that you return for fasting lab work next week.  The lab opens at 7:30 AM (Lipid, liver profiles and BMP)   Testing/Procedures: none  Follow-Up: Your physician wants you to follow-up in: 12 months.  You will receive a reminder letter in the mail two months in advance. If you don't receive a letter, please call our office to schedule the follow-up appointment.   Any Other Special Instructions Will Be Listed Below (If Applicable).

## 2015-03-11 NOTE — Progress Notes (Signed)
Chief Complaint  Patient presents with  . Shortness of Breath    History of Present Illness: 59 yo [place] male with h/o CAD s/p 7V CABG in 2000 at [facility], HTN, hyperlipidemia here today for follow up. He was seen as a new pt in January 2011 with complaints of chest pain. He had been followed by Dr. Juliann Pares in [place] and had yearly stress tests in that office. Cardiac cath 07/21/09 showed 5/7 patent bypass grafts but good flow into both diagonals (the targets of the occluded grafts). Stress echo October 2011 with great exercise tolerance (14 minutes with no chest pain and no EKG changes suggestive of ischemia). His echo showed septal, apical and inferobasal hypokinesis with mild worsening of the septal hypokinesis with exercise. His chest pain was mostly at rest. We started Imdur at that time. Continued to describe daily chest pain with sensation of "spiders crawling over the chest wall". Stress myoview 01/18/14 with no ischemia, LVEF=48%.   He is here today for follow up.  He is having less frequent chest pains. He has no SOB. He has been running some with his dog. He works in Production designer, theatre/television/film. He has an autistic son who is 32 yo. Wife is still sick with lupus and meningitis. Stress of his family issues has been worrisome for him.   Primary Care Physician: None  Last Lipid Profile:Lipid Panel     Component Value Date/Time   CHOL 160 08/04/2013 1447   TRIG 262* 08/04/2013 1447   HDL 36* 08/04/2013 1447   CHOLHDL 4.4 08/04/2013 1447   VLDL 52* 08/04/2013 1447   LDLCALC 72 08/04/2013 1447    Past Medical History  Diagnosis Date  . Hypertension   . Hyperlipidemia   . CAD (coronary artery disease)     s/p 7 vessel CABG 2000 at [facility]  . High cholesterol     Past Surgical History  Procedure Laterality Date  . Coronary artery bypass graft    . Cardiac catheterization    . 4v cabg    . Patent grafts    . S/p 7      Current Outpatient Prescriptions    Medication Sig Dispense Refill  . aspirin 81 MG tablet Take 81 mg by mouth daily.     . carvedilol (COREG) 3.125 MG tablet Take 1 tablet (3.125 mg total) by mouth 2 (two) times daily with a meal. 60 tablet 11  . lisinopril (PRINIVIL,ZESTRIL) 20 MG tablet TAKE ONE TABLET BY MOUTH ONCE DAILY 30 tablet 11  . nitroGLYCERIN (NITROSTAT) 0.4 MG SL tablet Place 1 tablet (0.4 mg total) under the tongue every 5 (five) minutes as needed for chest pain (up to 3 doses). 25 tablet 6  . rosuvastatin (CRESTOR) 10 MG tablet Take 1 tablet (10 mg total) by mouth daily. 30 tablet 11   No current facility-administered medications for this visit.    No Known Allergies  Social History   Social History  . Marital Status: Divorced    Spouse Name: N/A  . Number of Children: 3  . Years of Education: N/A   Occupational History  .     Social History Main Topics  . Smoking status: Former Smoker    Quit date: 06/19/1998  . Smokeless tobacco: Not on file  . Alcohol Use: No  . Drug Use: No  . Sexual Activity: Yes   Other Topics Concern  . Not on file   Social History Narrative    Family History  Problem Relation Age of Onset  . Heart failure Mother   . Heart failure Father   . Heart attack Neg Hx   . Stroke Neg Hx   . Hypertension Mother   . Hypertension Father   . Hypertension Sister   . Hypertension Brother     Review of Systems:  As stated in the HPI and otherwise negative.   BP 120/72 mmHg  Pulse 60  Ht  (1.651 m)  Wt 132 lb (59.875 kg)  BMI 21.97 kg/m2  Physical Examination: General: Well developed, well nourished, NAD HEENT: OP clear, mucus membranes moist SKIN: warm, dry. No rashes. Neuro: No focal deficits Musculoskeletal: Muscle strength 5/5 all ext Psychiatric: Mood and affect normal Neck: No JVD, no carotid bruits, no thyromegaly, no lymphadenopathy. Lungs:Clear bilaterally, no wheezes, rhonci, crackles Cardiovascular: Regular rate and rhythm. No murmurs, gallops or  rubs. Abdomen:Soft. Bowel sounds present. Non-tender.  Extremities: No lower extremity edema. Pulses are 2 + in the bilateral DP/PT.  EKG:  EKG is ordered today. The ekg ordered today demonstrates NSR, rate 60 bpm. Poor R wave progression precordial leads  Recent Labs: No results found for requested labs within last 365 days.   Lipid Panel    Component Value Date/Time   CHOL 160 08/04/2013 1447   TRIG 262* 08/04/2013 1447   HDL 36* 08/04/2013 1447   CHOLHDL 4.4 08/04/2013 1447   VLDL 52* 08/04/2013 1447   LDLCALC 72 08/04/2013 1447     Wt Readings from Last 3 Encounters:  03/11/15 132 lb (59.875 kg)  05/28/14 129 lb 6.4 oz (58.695 kg)  02/01/14 132 lb (59.875 kg)     Other studies Reviewed: Additional studies/ records that were reviewed today include: . Review of the above records demonstrates:    Assessment and Plan:   1. CAD: Recent chest pains. Known CAD. Stress myoview August 2015 with no ischemia. Continue current therapy which includes ASA, beta blocker, ARB and statin.    2. HTN: BP stable. No changes. Check BMET  3. HLD: On a statin. Lipids well controlled. Will repeat lipids and LFTs now.   Current medicines are reviewed at length with the patient today.  The patient does not have concerns regarding medicines.  The following changes have been made:  no change  Labs/ tests ordered today include:   Orders Placed This Encounter  Procedures  . Basic Metabolic Panel (BMET)  . Lipid Profile  . Hepatic function panel  . EKG 12-Lead    Disposition:   FU with me in 12  months  Signed, Verne Carrow, MD 03/11/2015 5:19 PM    [facility] [address] [place], [place], [place]  [address] Phone: [phone]; Fax: [phone]

## 2015-03-15 ENCOUNTER — Other Ambulatory Visit: Payer: Self-pay

## 2015-03-16 ENCOUNTER — Ambulatory Visit: Payer: Self-pay | Attending: Internal Medicine

## 2015-03-16 ENCOUNTER — Other Ambulatory Visit (INDEPENDENT_AMBULATORY_CARE_PROVIDER_SITE_OTHER): Payer: Self-pay | Admitting: *Deleted

## 2015-03-16 DIAGNOSIS — E785 Hyperlipidemia, unspecified: Secondary | ICD-10-CM

## 2015-03-16 DIAGNOSIS — I2510 Atherosclerotic heart disease of native coronary artery without angina pectoris: Secondary | ICD-10-CM

## 2015-03-16 LAB — BASIC METABOLIC PANEL
BUN: 13 mg/dL (ref 6–23)
CO2: 31 meq/L (ref 19–32)
Calcium: 9.7 mg/dL (ref 8.4–10.5)
Chloride: 102 mEq/L (ref 96–112)
Creatinine, Ser: 0.78 mg/dL (ref 0.40–1.50)
GFR: 108.45 mL/min (ref >60.00)
GLUCOSE: 91 mg/dL (ref 70–99)
POTASSIUM: 4.1 meq/L (ref 3.5–5.1)
SODIUM: 138 meq/L (ref 135–145)

## 2015-03-16 LAB — HEPATIC FUNCTION PANEL
ALBUMIN: 4.3 g/dL (ref 3.5–5.2)
ALT: 11 U/L (ref 0–53)
AST: 17 U/L (ref 0–37)
Alkaline Phosphatase: 46 U/L (ref 39–117)
Bilirubin, Direct: 0.1 mg/dL (ref 0.0–0.3)
Total Bilirubin: 0.6 mg/dL (ref 0.2–1.2)
Total Protein: 7.1 g/dL (ref 6.0–8.3)

## 2015-03-16 LAB — LIPID PANEL
CHOL/HDL RATIO: 3
Cholesterol: 154 mg/dL (ref 0–200)
HDL: 47.3 mg/dL (ref >39.00)
LDL Cholesterol: 76 mg/dL (ref 0–99)
NONHDL: 107.16
Triglycerides: 155 mg/dL — ABNORMAL HIGH (ref 0.0–149.0)
VLDL: 31 mg/dL (ref 0.0–40.0)

## 2015-06-19 HISTORY — PX: COLONOSCOPY: SHX174

## 2015-06-23 ENCOUNTER — Ambulatory Visit: Payer: No Typology Code available for payment source | Admitting: Family Medicine

## 2015-06-24 ENCOUNTER — Encounter: Payer: Self-pay | Admitting: Family Medicine

## 2015-06-24 ENCOUNTER — Ambulatory Visit: Payer: No Typology Code available for payment source | Attending: Family Medicine | Admitting: Family Medicine

## 2015-06-24 VITALS — BP 133/79 | HR 64 | Temp 97.8°F | Resp 13 | Ht 65.0 in | Wt 137.4 lb

## 2015-06-24 DIAGNOSIS — E785 Hyperlipidemia, unspecified: Secondary | ICD-10-CM

## 2015-06-24 DIAGNOSIS — R7303 Prediabetes: Secondary | ICD-10-CM | POA: Insufficient documentation

## 2015-06-24 DIAGNOSIS — I10 Essential (primary) hypertension: Secondary | ICD-10-CM

## 2015-06-24 DIAGNOSIS — Z7982 Long term (current) use of aspirin: Secondary | ICD-10-CM | POA: Insufficient documentation

## 2015-06-24 DIAGNOSIS — I25709 Atherosclerosis of coronary artery bypass graft(s), unspecified, with unspecified angina pectoris: Secondary | ICD-10-CM

## 2015-06-24 DIAGNOSIS — Z131 Encounter for screening for diabetes mellitus: Secondary | ICD-10-CM

## 2015-06-24 LAB — BASIC METABOLIC PANEL
BUN: 10 mg/dL (ref 7–25)
CALCIUM: 9.8 mg/dL (ref 8.6–10.3)
CO2: 30 mmol/L (ref 20–31)
CREATININE: 0.98 mg/dL (ref 0.70–1.33)
Chloride: 101 mmol/L (ref 98–110)
Glucose, Bld: 76 mg/dL (ref 65–99)
Potassium: 4.6 mmol/L (ref 3.5–5.3)
Sodium: 138 mmol/L (ref 135–146)

## 2015-06-24 LAB — POCT GLYCOSYLATED HEMOGLOBIN (HGB A1C): HEMOGLOBIN A1C: 6

## 2015-06-24 MED ORDER — CARVEDILOL 3.125 MG PO TABS: 3.1250 mg | ORAL_TABLET | Freq: Two times a day (BID) | ORAL | Status: DC

## 2015-06-24 MED ORDER — NITROGLYCERIN 0.4 MG SL SUBL
0.4000 mg | SUBLINGUAL_TABLET | SUBLINGUAL | Status: DC | PRN
Start: 2015-06-24 — End: 2016-04-20

## 2015-06-24 MED ORDER — ROSUVASTATIN CALCIUM 10 MG PO TABS: 10.0000 mg | ORAL_TABLET | Freq: Every day | ORAL | Status: DC

## 2015-06-24 MED ORDER — LISINOPRIL 20 MG PO TABS: ORAL_TABLET | ORAL | Status: DC

## 2015-06-24 NOTE — Progress Notes (Signed)
Patient here to establish care He does see MD at [facility] He needs refills on all medications and has been it of his Coreg for 4-5 days Tommi RumpsDe refuses flu shot

## 2015-06-24 NOTE — Progress Notes (Signed)
Subjective:  Patient ID: [name] Sosa, male    DOB: 05/13/1956  Age: 60 y.o. MRN: [id number]  CC: Establish Care   HPI [name] PerchesSeyed Kamau is a 60 year old male with a history of hypertension, hyperlipidemia, coronary artery disease s/p 7V CABG (at [facility] in 02/2015 and comes in to establish care today; his last myocardial perfusion scan from 01/2014 was negative for ischemia, LVEF 48% . He walks occasionally for exercise and sometimes runs;  does not smoke. Denies chest pain, shortness of breath, pedal edema or orthopnea and is requesting a refill of all his medications. Offered a flu shot which he declines at this time.  Past Medical History  Diagnosis Date  . Hypertension   . Hyperlipidemia   . CAD (coronary artery disease)     s/p [facility]  . High cholesterol     Past Surgical History  Procedure Laterality Date  . Coronary artery bypass graft    . Cardiac catheterization    . 4v cabg    . Patent grafts    . S/p 7      No Known Allergies    Outpatient Prescriptions Prior to Visit  Medication Sig Dispense Refill  . aspirin 81 MG tablet Take 81 mg by mouth daily.     . carvedilol (COREG) 3.125 MG tablet Take 1 tablet (3.125 mg total) by mouth 2 (two) times daily with a meal. 60 tablet 11  . lisinopril (PRINIVIL,ZESTRIL) 20 MG tablet TAKE ONE TABLET BY MOUTH ONCE DAILY 30 tablet 11  . nitroGLYCERIN (NITROSTAT) 0.4 MG SL tablet Place 1 tablet (0.4 mg total) under the tongue every 5 (five) minutes as needed for chest pain (up to 3 doses). 25 tablet 6  . rosuvastatin (CRESTOR) 10 MG tablet Take 1 tablet (10 mg total) by mouth daily. 30 tablet 11   No facility-administered medications prior to visit.    ROS Review of Systems  Constitutional: Negative for activity change and appetite change.  HENT: Negative for sinus pressure and sore throat.   Eyes: Negative for visual disturbance.  Respiratory: Negative for cough, chest tightness  and shortness of breath.   Cardiovascular: Negative for chest pain and leg swelling.  Gastrointestinal: Negative for abdominal pain, diarrhea, constipation and abdominal distention.  Endocrine: Negative.   Genitourinary: Negative for dysuria.  Musculoskeletal: Negative for myalgias and joint swelling.  Skin: Negative for rash.  Allergic/Immunologic: Negative.   Neurological: Negative for weakness, light-headedness and numbness.  Psychiatric/Behavioral: Negative for suicidal ideas and dysphoric mood.    Objective:  BP 133/79 mmHg  Pulse 64  Temp(Src) 97.8 F (36.6 C)  Resp 13  Ht 5\' 5"  (1.651 m)  Wt 137 lb 6.4 oz (62.324 kg)  BMI 22.86 kg/m2  SpO2 97%  BP/Weight 06/24/2015 03/11/2015 05/28/2014  Systolic BP 133 120 102  Diastolic BP 79 72 68  Wt. (Lbs) 137.4 132 129.4  BMI 22.86 21.97 21.53    Lipid Panel     Component Value Date/Time   CHOL 154 03/16/2015 1010   TRIG 155.0* 03/16/2015 1010   HDL 47.30 03/16/2015 1010   CHOLHDL 3 03/16/2015 1010   VLDL 31.0 03/16/2015 1010   LDLCALC 76 03/16/2015 1010       Physical Exam Constitutional: normal appearing,  Eyes: PERRLA HEENT: Head is atraumatic, normal sinuses, normal oropharynx, normal appearing tonsils and palate, tympanic membrane is normal bilaterally. Neck: normal range of motion, no thyromegaly, no JVD Cardiovascular: normal rate and  rhythm, normal heart sounds, no murmurs, rub or gallop, no pedal edema Respiratory: clear to auscultation bilaterally, no wheezes, no rales, no rhonchi Abdomen: soft, not tender to palpation, normal bowel sounds, no enlarged organs Extremities: Full ROM, no tenderness in joints Skin: warm and dry, no lesions. Neurological: alert, oriented x3, cranial nerves I-XII grossly intact , normal motor strength, normal sensation. Psychological: normal mood.   Assessment & Plan:   1. Hyperlipidemia Lipid panel from 02/2015 revealed mildly elevated triglycerides - rosuvastatin (CRESTOR)  10 MG tablet; Take 1 tablet (10 mg total) by mouth daily.  Dispense: 30 tablet; Refill: 3  2. HYPERTENSION, BENIGN Controlled Will send off labs to evaluate for Hyperkalemia as he is on Lisinopril - Basic Metabolic Panel - carvedilol (COREG) 3.125 MG tablet; Take 1 tablet (3.125 mg total) by mouth 2 (two) times daily with a meal.  Dispense: 60 tablet; Refill: 3 - lisinopril (PRINIVIL,ZESTRIL) 20 MG tablet; TAKE ONE TABLET BY MOUTH ONCE DAILY  Dispense: 30 tablet; Refill: 3  3. 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  Dispense: 25 tablet; Refill: 3  4. Screening for diabetes mellitus A1c of 6.0- Pre Diabetes Educated about lifestyle changes, ADA diet. - HgB A1c   Meds ordered this encounter  Medications  . nitroGLYCERIN (NITROSTAT) 0.4 MG SL tablet    Sig: Place 1 tablet (0.4 mg total) under the tongue every 5 (five) minutes as needed for chest pain (up to 3 doses).    Dispense:  25 tablet    Refill:  3    Do not take Nitroglycerin within 24 hours of taking Viagra. Do not take Viagra within 24 hours of taking a Nitroglycerin.  . carvedilol (COREG) 3.125 MG tablet    Sig: Take 1 tablet (3.125 mg total) by mouth 2 (two) times daily with a meal.    Dispense:  60 tablet    Refill:  3  . lisinopril (PRINIVIL,ZESTRIL) 20 MG tablet    Sig: TAKE ONE TABLET BY MOUTH ONCE DAILY    Dispense:  30 tablet    Refill:  3  . rosuvastatin (CRESTOR) 10 MG tablet    Sig: Take 1 tablet (10 mg total) by mouth daily.    Dispense:  30 tablet    Refill:  3    Follow-up: No Follow-up on file.   Jaclyn Shaggy MD

## 2015-06-24 NOTE — Patient Instructions (Signed)
Hypertension Hypertension, commonly called high blood pressure, is when the force of blood pumping through your arteries is too strong. Your arteries are the blood vessels that carry blood from your heart throughout your body. A blood pressure reading consists of a higher number over a lower number, such as 110/72. The higher number (systolic) is the pressure inside your arteries when your heart pumps. The lower number (diastolic) is the pressure inside your arteries when your heart relaxes. Ideally you want your blood pressure below 120/80. Hypertension forces your heart to work harder to pump blood. Your arteries may become narrow or stiff. Having untreated or uncontrolled hypertension can cause heart attack, stroke, kidney disease, and other problems. RISK FACTORS Some risk factors for high blood pressure are controllable. Others are not.  Risk factors you cannot control include:   Race. You may be at higher risk if you are African American.  Age. Risk increases with age.  Gender. Men are at higher risk than women before age 45 years. After age 65, women are at higher risk than men. Risk factors you can control include:  Not getting enough exercise or physical activity.  Being overweight.  Getting too much fat, sugar, calories, or salt in your diet.  Drinking too much alcohol. SIGNS AND SYMPTOMS Hypertension does not usually cause signs or symptoms. Extremely high blood pressure (hypertensive crisis) may cause headache, anxiety, shortness of breath, and nosebleed. DIAGNOSIS To check if you have hypertension, your health care provider will measure your blood pressure while you are seated, with your arm held at the level of your heart. It should be measured at least twice using the same arm. Certain conditions can cause a difference in blood pressure between your right and left arms. A blood pressure reading that is higher than normal on one occasion does not mean that you need treatment. If  it is not clear whether you have high blood pressure, you may be asked to return on a different day to have your blood pressure checked again. Or, you may be asked to monitor your blood pressure at home for 1 or more weeks. TREATMENT Treating high blood pressure includes making lifestyle changes and possibly taking medicine. Living a healthy lifestyle can help lower high blood pressure. You may need to change some of your habits. Lifestyle changes may include:  Following the DASH diet. This diet is high in fruits, vegetables, and whole grains. It is low in salt, red meat, and added sugars.  Keep your sodium intake below 2,300 mg per day.  Getting at least 30-45 minutes of aerobic exercise at least 4 times per week.  Losing weight if necessary.  Not smoking.  Limiting alcoholic beverages.  Learning ways to reduce stress. Your health care provider may prescribe medicine if lifestyle changes are not enough to get your blood pressure under control, and if one of the following is true:  You are 18-59 years of age and your systolic blood pressure is above 140.  You are 60 years of age or older, and your systolic blood pressure is above 150.  Your diastolic blood pressure is above 90.  You have diabetes, and your systolic blood pressure is over 140 or your diastolic blood pressure is over 90.  You have kidney disease and your blood pressure is above 140/90.  You have heart disease and your blood pressure is above 140/90. Your personal target blood pressure may vary depending on your medical conditions, your age, and other factors. HOME CARE INSTRUCTIONS    Have your blood pressure rechecked as directed by your health care provider.   Take medicines only as directed by your health care provider. Follow the directions carefully. Blood pressure medicines must be taken as prescribed. The medicine does not work as well when you skip doses. Skipping doses also puts you at risk for  problems.  Do not smoke.   Monitor your blood pressure at home as directed by your health care provider. SEEK MEDICAL CARE IF:   You think you are having a reaction to medicines taken.  You have recurrent headaches or feel dizzy.  You have swelling in your ankles.  You have trouble with your vision. SEEK IMMEDIATE MEDICAL CARE IF:  You develop a severe headache or confusion.  You have unusual weakness, numbness, or feel faint.  You have severe chest or abdominal pain.  You vomit repeatedly.  You have trouble breathing. MAKE SURE YOU:   Understand these instructions.  Will watch your condition.  Will get help right away if you are not doing well or get worse.   This information is not intended to replace advice given to you by your health care provider. Make sure you discuss any questions you have with your health care provider.   Document Released: 06/04/2005 Document Revised: 10/19/2014 Document Reviewed: 03/27/2013 Elsevier Interactive Patient Education 2016 Elsevier Inc.  

## 2015-07-01 ENCOUNTER — Telehealth: Payer: Self-pay

## 2015-07-01 NOTE — Telephone Encounter (Signed)
CMA called patient, patient didn't answer. Left a message for the patient to return my call asap 

## 2015-07-01 NOTE — Telephone Encounter (Signed)
-----   Message from Jaclyn ShaggyEnobong Amao, MD sent at 06/28/2015  8:30 AM EST ----- Please inform the patient that labs are normal. Thank you.

## 2015-07-15 MED FILL — ROSUVASTATIN CAL 10 MG TAB: 10 | 30 days supply | Qty: 30 | Fill #0

## 2015-07-15 MED FILL — NITROSTAT 0.4 MG TABLET SL: 0.4 | 25 days supply | Qty: 25 | Fill #0

## 2015-07-30 ENCOUNTER — Emergency Department
Admission: EM | Admit: 2015-07-30 | Discharge: 2015-07-30 | Disposition: A | Discharge status: Home / Self Care | Payer: No Typology Code available for payment source | Attending: Emergency Medicine | Admitting: Emergency Medicine

## 2015-07-30 ENCOUNTER — Encounter: Payer: Self-pay | Admitting: Emergency Medicine

## 2015-07-30 DIAGNOSIS — Z79899 Other long term (current) drug therapy: Secondary | ICD-10-CM | POA: Insufficient documentation

## 2015-07-30 DIAGNOSIS — Z7982 Long term (current) use of aspirin: Secondary | ICD-10-CM | POA: Insufficient documentation

## 2015-07-30 DIAGNOSIS — R202 Paresthesia of skin: Secondary | ICD-10-CM | POA: Insufficient documentation

## 2015-07-30 DIAGNOSIS — T699XXA Effect of reduced temperature, unspecified, initial encounter: Secondary | ICD-10-CM

## 2015-07-30 DIAGNOSIS — L539 Erythematous condition, unspecified: Secondary | ICD-10-CM | POA: Insufficient documentation

## 2015-07-30 DIAGNOSIS — Z87891 Personal history of nicotine dependence: Secondary | ICD-10-CM | POA: Insufficient documentation

## 2015-07-30 DIAGNOSIS — M79641 Pain in right hand: Secondary | ICD-10-CM | POA: Insufficient documentation

## 2015-07-30 DIAGNOSIS — M79642 Pain in left hand: Secondary | ICD-10-CM | POA: Insufficient documentation

## 2015-07-30 DIAGNOSIS — I10 Essential (primary) hypertension: Secondary | ICD-10-CM | POA: Insufficient documentation

## 2015-07-30 DIAGNOSIS — X31XXXA Exposure to excessive natural cold, initial encounter: Secondary | ICD-10-CM | POA: Insufficient documentation

## 2015-07-30 MED ORDER — IBUPROFEN 800 MG PO TABS: 800.0000 mg | ORAL_TABLET | Freq: Three times a day (TID) | ORAL | Status: DC

## 2015-07-30 NOTE — Discharge Instructions (Signed)
Begin taking ibuprofen 3 times a day with food. Follow-up with [facility] in 2 days. Avoid cold weather, wear gloves when outside. Do not put your hands in hot water. Return to the emergency room if any pus, skin coming off, or darkened skin

## 2015-07-30 NOTE — ED Notes (Signed)
Left hand fingers #3,4,5 and right hand finger # 2,3,4,5 are red and tingling. Fingers are pale red, warm with + cap refill. + pulses.

## 2015-07-30 NOTE — ED Notes (Signed)
Fingertips red and painful since yesterday

## 2015-07-30 NOTE — ED Provider Notes (Signed)
[facility] Emergency Department Provider Note  ____________________________________________  Time seen: Approximately 7:47 AM  I have reviewed the triage vital signs and the nursing notes.   HISTORY  Chief Complaint Hand Pain   HPI Ion Gonnella is a 60 y.o. male is here with complaint of tingling and redness in the distal pads of his left 3, 4, 5 and right hand fingers #2, 3, 4, and 5. Patient gives a history of working out on a vehicle in the early hours of yesterday morning. He states he was not wearing gloves because it's impossible to do anything with clothes on. He states that his hands got extremely cold. He complains now that the fingertips are red and painful. He is extremely worried about frostbite. He states that sensation in his fingers is slightly decreased at the pad only. He continues to have normal range of motion.Currently he is on blood pressure medicine and continues to take one aspirin a day. He rates his pain as a 3/10.   Past Medical History  Diagnosis Date  . Hypertension   . Hyperlipidemia   . CAD (coronary artery disease)     s/p 7 vessel CABG 2000 at [facility]  . High cholesterol     Patient Active Problem List   Diagnosis Date Noted  . CAD (coronary artery disease) 03/28/2011  . Hyperlipidemia 07/12/2009  . HYPERTENSION, BENIGN 07/12/2009  . CAD, ARTERY BYPASS GRAFT 07/12/2009  . CHEST PAIN-UNSPECIFIED 06/30/2009    Past Surgical History  Procedure Laterality Date  . Coronary artery bypass graft    . Cardiac catheterization    . 4v cabg    . Patent grafts    . S/p 7      Current Outpatient Rx  Name  Route  Sig  Dispense  Refill  . aspirin 81 MG tablet   Oral   Take 81 mg by mouth daily.          . carvedilol (COREG) 3.125 MG tablet   Oral   Take 1 tablet (3.125 mg total) by mouth 2 (two) times daily with a meal.   60 tablet   3   . ibuprofen (ADVIL,MOTRIN) 800 MG tablet   Oral   Take 1 tablet (800 mg total)  by mouth 3 (three) times daily.   30 tablet   0   . lisinopril (PRINIVIL,ZESTRIL) 20 MG tablet      TAKE ONE TABLET BY MOUTH ONCE DAILY   30 tablet   3   . nitroGLYCERIN (NITROSTAT) 0.4 MG SL tablet   Sublingual   Place 1 tablet (0.4 mg total) under the tongue every 5 (five) minutes as needed for chest pain (up to 3 doses).   25 tablet   3     Do not take Nitroglycerin within 24 hours of takin ...   . rosuvastatin (CRESTOR) 10 MG tablet   Oral   Take 1 tablet (10 mg total) by mouth daily.   30 tablet   3     Allergies Review of patient's allergies indicates no known allergies.  Family History  Problem Relation Age of Onset  . Heart failure Mother   . Heart failure Father   . Heart attack Neg Hx   . Stroke Neg Hx   . Hypertension Mother   . Hypertension Father   . Hypertension Sister   . Hypertension Brother     Social History Social History  Substance Use Topics  . Smoking status: Former Smoker  Quit date: 06/19/1998  . Smokeless tobacco: None  . Alcohol Use: No    Review of Systems Constitutional: No fever/chills Cardiovascular: Denies chest pain. Respiratory: Denies shortness of breath. Gastrointestinal: .  No nausea, no vomiting.  Musculoskeletal: Negative for back pain. Positive bilateral hand pain. Skin: Negative for rash. Erythema fingers bilateral hands. Neurological: Negative for headaches, focal weakness or numbness.  10-point ROS otherwise negative.  ____________________________________________   PHYSICAL EXAM:  VITAL SIGNS: ED Triage Vitals  Enc Vitals Group     BP 07/30/15 0721 147/81 mmHg     Pulse Rate 07/30/15 0721 69     Resp 07/30/15 0721 18     Temp 07/30/15 0721 97.5 F (36.4 C)     Temp Source 07/30/15 0721 Oral     SpO2 07/30/15 0721 99 %     Weight 07/30/15 0721 130 lb (58.968 kg)     Height 07/30/15 0721  (1.727 m)     Head Cir --      Peak Flow --      Pain Score 07/30/15 0722 3     Pain Loc --      Pain  Edu? --      Excl. in GC? --     Constitutional: Alert and oriented. Well appearing and in no acute distress. Eyes: Conjunctivae are normal. PERRL. EOMI. Head: Atraumatic. Nose: No congestion/rhinnorhea. Neck: No stridor.   Cardiovascular: Normal rate, regular rhythm. Grossly normal heart sounds.  Good peripheral circulation. Respiratory: Normal respiratory effort.  No retractions. Lungs CTAB. Gastrointestinal: Soft and nontender. No distention. Musculoskeletal: Bilateral hands there is no gross deformity. There is some erythema noted on the distal tips and pads of the involved fingers. There is no restraining or skin sloughing present skin is intact. There is tenderness on palpation. There is no joint edema. Cap refill continues to be less than 3 seconds. Patient is able to make a complete fist with both hands without any difficulty. Neurologic:  Normal speech and language. No gross focal neurologic deficits are appreciated. No gait instability. Skin:  Skin is warm, dry and intact. See muscle skeletal forefingers Psychiatric: Mood and affect are normal. Speech and behavior are normal.  ____________________________________________   LABS (all labs ordered are listed, but only abnormal results are displayed)  Labs Reviewed - No data to display  PROCEDURES  Procedure(s) performed: None  Critical Care performed: No  ____________________________________________   INITIAL IMPRESSION / ASSESSMENT AND PLAN / ED COURSE  Pertinent labs & imaging results that were available during my care of the patient were reviewed by me and considered in my medical decision making (see chart for details).  Patient was started on ibuprofen 800 mg 3 times a day for inflammation and also told to not use hot water to his hands. He is to have his fingers rechecked in 2 days or sooner if any signs of infection or if he starts running fever. By history this was a lengthy cold exposure. Patient was encouraged  to wear gloves or not work outside in extreme cold. ____________________________________________   FINAL CLINICAL IMPRESSION(S) / ED DIAGNOSES  Final diagnoses:  Bilateral hand pain  Exposure to environmental cold, initial encounter      Tommi Rumps, PA-C 07/30/15 1340  Minna Antis, MD 07/30/15 1418

## 2015-08-23 ENCOUNTER — Telehealth: Payer: Self-pay | Admitting: Cardiovascular Disease

## 2015-08-23 NOTE — Telephone Encounter (Signed)
New Message:  Pt called in wanting to ask Dennie Bibleat a question and would like for her to call him back .

## 2015-08-24 MED ORDER — SILDENAFIL CITRATE 100 MG PO TABS: 100.0000 mg | ORAL_TABLET | Freq: Every day | ORAL | Status: DC | PRN

## 2015-08-24 NOTE — Telephone Encounter (Signed)
Left message to call back  

## 2015-08-24 NOTE — Telephone Encounter (Signed)
OK with me. cdm 

## 2015-08-24 NOTE — Telephone Encounter (Signed)
Spoke with pt. He is asking if Dr. Clifton JamesMcAlhany would prescribe Viagra. He states he has taken in past. Chart reviewed and dose was 100 mg. Pt requesting 8 tablets.  Pt aware he should not take in same 24 hour period as NTG.  I told pt I would send request to Dr. Clifton JamesMcAlhany and if OK with him will send to his pharmacy ([facility]).

## 2015-08-24 NOTE — Telephone Encounter (Signed)
Prescription sent to [facility]

## 2016-03-15 ENCOUNTER — Encounter: Payer: Self-pay | Admitting: Cardiology

## 2016-03-16 ENCOUNTER — Encounter: Payer: Self-pay | Admitting: Cardiology

## 2016-03-16 ENCOUNTER — Ambulatory Visit (INDEPENDENT_AMBULATORY_CARE_PROVIDER_SITE_OTHER): Payer: BLUE CROSS/BLUE SHIELD | Admitting: Cardiology

## 2016-03-16 ENCOUNTER — Encounter: Payer: Self-pay | Admitting: *Deleted

## 2016-03-16 VITALS — BP 120/70 | HR 68 | Ht 67.5 in | Wt 134.8 lb

## 2016-03-16 DIAGNOSIS — I200 Unstable angina: Secondary | ICD-10-CM

## 2016-03-16 LAB — BASIC METABOLIC PANEL
BUN: 10 mg/dL (ref 7–25)
CHLORIDE: 102 mmol/L (ref 98–110)
CO2: 28 mmol/L (ref 20–31)
Calcium: 9.7 mg/dL (ref 8.6–10.3)
Creat: 0.79 mg/dL (ref 0.70–1.33)
Glucose, Bld: 98 mg/dL (ref 65–99)
POTASSIUM: 5.1 mmol/L (ref 3.5–5.3)
Sodium: 137 mmol/L (ref 135–146)

## 2016-03-16 LAB — CBC
HCT: 43.1 % (ref 38.5–50.0)
Hemoglobin: 14.9 g/dL (ref 13.2–17.1)
MCH: 31.3 pg (ref 27.0–33.0)
MCHC: 34.6 g/dL (ref 32.0–36.0)
MCV: 90.5 fL (ref 80.0–100.0)
MPV: 11.3 fL (ref 7.5–12.5)
PLATELETS: 282 10*3/uL (ref 140–400)
RBC: 4.76 MIL/uL (ref 4.20–5.80)
RDW: 13.1 % (ref 11.0–15.0)
WBC: 9.5 10*3/uL (ref 3.8–10.8)

## 2016-03-16 LAB — APTT: aPTT: 29 s (ref 22–34)

## 2016-03-16 NOTE — Progress Notes (Signed)
03/16/2016 [name] Sosa   05/28/1956  [id number]  Primary Physician No PCP Per Patient Primary Cardiologist: Dr. Clifton JamesMcAlhany   Reason for Visit/CC: Increasing Angina and Exertional Dyspnea  HPI:  60 yo [place] male with h/o CAD s/p [facility], HTN, hyperlipidemia here today for 1 year follow up. He is followed by Dr. Clifton JamesMcAlhany. He was seen as a new pt in January 2011 with complaints of chest pain. He had been followed by Dr. Juliann Paresallwood in [place] and had yearly stress tests in that office. Cardiac cath 07/21/09 showed 5/7 patent bypass grafts but good flow into both diagonals (the targets of the occluded grafts). Stress echo October 2011 with great exercise tolerance (14 minutes with no chest pain and no EKG changes suggestive of ischemia). His echo showed septal, apical and inferobasal hypokinesis with mild worsening of the septal hypokinesis with exercise. He was treated medically. Imdur was added, however patient reports that he self discontinued Imdur given intolerance with HAs. He had a stress Myoview 01/2014 that was negative for ischemia. EF was 48% by stress test estimate. He also has ED and Dr. Clifton JamesMcAlhany has written Rx for Viagra, in the past, with the patient understanding not to use within 24 hrs of PRN NTG use. He also has treated HTN and HLD. He is followed medically by Dr. Nehemiah SettlePolite.  He presents to clinic today with complaint of increased frequency of resting SSCP. Described as a burning sensation, which is similar to his angina prior to undergoing CABG. Symptoms usually resolve spontaneously after 5 min, however now occurring more frequently,  multiple times a day. He has also developed increasing exertional dyspnea. He is unable to walk up a flight of stairs w/o symptoms. He has to stop to rest. This was once an easy task for him but now difficult. No exertional dyspnea walking on flat terrain. Exertional symptoms resolve with rest and resting  symptoms spontaneously after 5 minutes or so. He has SL NTG at home, to use if needed.   He was seen by his PCP yesterday for the same symptoms and he was referred to our office for evaluation. 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 He is currently CP free in clinic today.     Current Meds  Medication Sig  . aspirin 81 MG tablet Take 81 mg by mouth daily.   . carvedilol (COREG) 3.125 MG tablet Take 1 tablet (3.125 mg total) by mouth 2 (two) times daily with a meal.  . lisinopril (PRINIVIL,ZESTRIL) 20 MG tablet TAKE ONE TABLET BY MOUTH ONCE DAILY  . nitroGLYCERIN (NITROSTAT) 0.4 MG SL tablet Place 1 tablet (0.4 mg total) under the tongue every 5 (five) minutes as needed for chest pain (up to 3 doses).  . rosuvastatin (CRESTOR) 10 MG tablet Take 1 tablet (10 mg total) by mouth daily.  . [DISCONTINUED] sildenafil (VIAGRA) 100 MG tablet Take 1 tablet (100 mg total) by mouth daily as needed for erectile dysfunction.   No Known Allergies Past Medical History:  Diagnosis Date  . CAD (coronary artery disease)    [facility]  . High cholesterol   . Hyperlipidemia   . Hypertension    Family History  Problem Relation Age of Onset  . Heart failure Mother   . Hypertension Mother   . Heart failure Father   . Hypertension Father   . Hypertension Sister   . Hypertension Brother   .  Heart attack Neg Hx   . Stroke Neg Hx    Past Surgical History:  Procedure Laterality Date  . 4v cabg    . CARDIAC CATHETERIZATION    . CORONARY ARTERY BYPASS GRAFT    . patent grafts    . s/p 7     Social History   Social History  . Marital status: Divorced    Spouse name: N/A  . Number of children: 3  . Years of education: N/A   Occupational History  .  Unemployed   Social History Main Topics  . Smoking status: Former Smoker    Quit date: 06/19/1998  . Smokeless tobacco: Never Used  . Alcohol use No  . Drug use: No  . Sexual activity: Yes   Other Topics  Concern  . Not on file   Social History Narrative  . No narrative on file     Review of Systems: General: negative for chills, fever, night sweats or weight changes.  Cardiovascular: negative for chest pain, dyspnea on exertion, edema, orthopnea, palpitations, paroxysmal nocturnal dyspnea or shortness of breath Dermatological: negative for rash Respiratory: negative for cough or wheezing Urologic: negative for hematuria Abdominal: negative for nausea, vomiting, diarrhea, bright red blood per rectum, melena, or hematemesis Neurologic: negative for visual changes, syncope, or dizziness All other systems reviewed and are otherwise negative except as noted above.   Physical Exam:  Blood pressure 120/70, pulse 68, height 5' 7.5" (1.715 m), weight 134 lb 12.8 oz (61.1 kg), SpO2 97 %.  General appearance: alert, cooperative and no distress Neck: no carotid bruit and no JVD Lungs: clear to auscultation bilaterally Heart: regular rate and rhythm, S1, S2 normal, no murmur, click, rub or gallop Extremities: no LEE Pulses: 2+ and symmetric Skin: warm and dry Neurologic: Grossly normal  EKG NSR. 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   ASSESSMENT AND PLAN:   1. CAD/ Unstable Angina: s/p  7V CABG in 2011. He has had increasing angina. SSC pressure and burning that has been occurring more frequently at rest. Occurs several times a day. Also with new exertional dyspnea walking up stairs, which was once an easier task for him. No exertional dyspnea walking on flat terrain.  Exertional symptoms resolve with rest and resting symptoms resolve spontaneously after 5 minutes or so. His last Cardiac cath was over 6 years ago, 07/21/09, which showed 5/7 patent bypass grafts but good flow into both diagonals (the targets of the occluded grafts). He has not been studied invasively since that time. He has other risk factors including HTN and HLD. There is little room to adjust antianginals. HR is in the 60s  limiting upward titration of his BB. He is also intolerant to Imdur due to HAs. We dicussed risk and benefits and have agreed to refer for definitive LHC to assess for any changes in his native + graft anatomy. Risk reviewed included risk for death, MI, stroke, vascular injury, bleeding, loss of limb, renal injury and allergic reaction. Pt fully understands the risk and benefits and agrees to proceed.  Will arrange with Dr. [name] on 03/23/16 at [facility]. Pt has SL NTG at home. We reviewed instructions for SL NTG use. Pt informed to go to ED if worsening symptoms unrelieved with rest or SLNTG. We will obtain basic pre-cath labs including CBC, BMP and INR today.   PLAN  Definitive LHC +/- PCI at [facility] for unstable angina.   Robbie Lis PA-C 03/16/2016 9:48 AM

## 2016-03-16 NOTE — Patient Instructions (Addendum)
Medication Instructions:   Your physician recommends that you continue on your current medications as directed. Please refer to the Current Medication list given to you today.   If you need a refill on your cardiac medications before your next appointment, please call your pharmacy.  Labwork: BMET CBC AND PT/PTT   Testing/Procedures: SEE LETTER FOR INSTRUCTIONS: Your physician has requested that you have a cardiac catheterization. Cardiac catheterization is used to diagnose and/or treat various heart conditions. Doctors may recommend this procedure for a number of different reasons. The most common reason is to evaluate chest pain. Chest pain can be a symptom of coronary artery disease (CAD), and cardiac catheterization can show whether plaque is narrowing or blocking your heart's arteries. This procedure is also used to evaluate the valves, as well as measure the blood flow and oxygen levels in different parts of your heart. For further information please visit [email or web address]. Please follow instruction sheet, as given.     Follow-Up: WILL BE DETERMINED AFTER CATHERIZATION   Any Other Special Instructions Will Be Listed Below (If Applicable).  IF ANY SEVERE CHEST PRESSURE  SYMPTOMS RETURN  AND NOT RELIEVED WITH  NITROGLYCERIN  GO TO THE EMERGENCY DEPARTMENT

## 2016-03-20 ENCOUNTER — Telehealth: Payer: Self-pay | Admitting: *Deleted

## 2016-03-20 NOTE — Telephone Encounter (Signed)
-----   Message from Brittainy M Simmons, PA-C sent at 03/19/2016  8:12 AM EDT ----- Labs are ok. Continue with plans for heart cath. 

## 2016-03-20 NOTE — Telephone Encounter (Signed)
SPOKE TO PT ABOUT RESULTS AND VERBALIZED UNDERSTANDING  

## 2016-03-20 NOTE — Telephone Encounter (Signed)
-----   Message from Allayne ButcherBrittainy M Simmons, [place]PA-C sent at 03/19/2016  8:12 AM EDT ----- Labs are ok. Continue with plans for heart cath.

## 2016-03-23 ENCOUNTER — Ambulatory Visit (HOSPITAL_COMMUNITY)
Admission: RE | Admit: 2016-03-23 | Discharge: 2016-03-23 | Disposition: A | Discharge status: Home / Self Care | Payer: BLUE CROSS/BLUE SHIELD | Source: Ambulatory Visit | Attending: Cardiovascular Disease | Admitting: Cardiovascular Disease

## 2016-03-23 ENCOUNTER — Encounter (HOSPITAL_COMMUNITY): Payer: Self-pay | Admitting: *Deleted

## 2016-03-23 ENCOUNTER — Encounter (HOSPITAL_COMMUNITY)
Admission: RE | Disposition: A | Discharge status: Home / Self Care | Payer: Self-pay | Source: Ambulatory Visit | Attending: Cardiovascular Disease

## 2016-03-23 DIAGNOSIS — I25710 Atherosclerosis of autologous vein coronary artery bypass graft(s) with unstable angina pectoris: Secondary | ICD-10-CM | POA: Insufficient documentation

## 2016-03-23 DIAGNOSIS — I25110 Atherosclerotic heart disease of native coronary artery with unstable angina pectoris: Secondary | ICD-10-CM | POA: Diagnosis present

## 2016-03-23 DIAGNOSIS — Z87891 Personal history of nicotine dependence: Secondary | ICD-10-CM | POA: Diagnosis not present

## 2016-03-23 DIAGNOSIS — Z8249 Family history of ischemic heart disease and other diseases of the circulatory system: Secondary | ICD-10-CM | POA: Insufficient documentation

## 2016-03-23 DIAGNOSIS — E785 Hyperlipidemia, unspecified: Secondary | ICD-10-CM | POA: Insufficient documentation

## 2016-03-23 DIAGNOSIS — I10 Essential (primary) hypertension: Secondary | ICD-10-CM | POA: Diagnosis not present

## 2016-03-23 DIAGNOSIS — I2582 Chronic total occlusion of coronary artery: Secondary | ICD-10-CM | POA: Insufficient documentation

## 2016-03-23 DIAGNOSIS — Z7982 Long term (current) use of aspirin: Secondary | ICD-10-CM | POA: Diagnosis not present

## 2016-03-23 DIAGNOSIS — E7800 Pure hypercholesterolemia, unspecified: Secondary | ICD-10-CM | POA: Diagnosis not present

## 2016-03-23 HISTORY — PX: CARDIAC CATHETERIZATION: SHX172

## 2016-03-23 LAB — PROTIME-INR
INR: 0.95
Prothrombin Time: 12.7 seconds (ref 11.4–15.2)

## 2016-03-23 LAB — POCT ACTIVATED CLOTTING TIME: Activated Clotting Time: 153 seconds

## 2016-03-23 SURGERY — LEFT HEART CATH AND CORS/GRAFTS ANGIOGRAPHY
Anesthesia: LOCAL

## 2016-03-23 MED ORDER — SODIUM CHLORIDE 0.9 % IV SOLN: 250.0000 mL | INTRAVENOUS | Status: DC | PRN

## 2016-03-23 MED ORDER — IOPAMIDOL (ISOVUE-370) INJECTION 76%
INTRAVENOUS | Status: DC | PRN
  Administered 2016-03-23: 70 mL via INTRA_ARTERIAL

## 2016-03-23 MED ORDER — SODIUM CHLORIDE 0.9% FLUSH: 3.0000 mL | INTRAVENOUS | Status: DC | PRN

## 2016-03-23 MED ORDER — MIDAZOLAM HCL 2 MG/2ML IJ SOLN
INTRAMUSCULAR | Status: AC
  Filled 2016-03-23: qty 2

## 2016-03-23 MED ORDER — SODIUM CHLORIDE 0.9 % IV SOLN: INTRAVENOUS | Status: AC

## 2016-03-23 MED ORDER — LIDOCAINE HCL (PF) 1 % IJ SOLN
INTRAMUSCULAR | Status: DC | PRN
  Administered 2016-03-23: 3 mL

## 2016-03-23 MED ORDER — FENTANYL CITRATE (PF) 100 MCG/2ML IJ SOLN
INTRAMUSCULAR | Status: DC | PRN
  Administered 2016-03-23: 50 ug via INTRAVENOUS
  Administered 2016-03-23: 25 ug via INTRAVENOUS

## 2016-03-23 MED ORDER — SODIUM CHLORIDE 0.9% FLUSH: 3.0000 mL | Freq: Two times a day (BID) | INTRAVENOUS | Status: DC

## 2016-03-23 MED ORDER — ASPIRIN 81 MG PO CHEW: 81.0000 mg | CHEWABLE_TABLET | ORAL | Status: DC

## 2016-03-23 MED ORDER — HEPARIN SODIUM (PORCINE) 1000 UNIT/ML IJ SOLN
INTRAMUSCULAR | Status: DC | PRN
  Administered 2016-03-23: 3500 [IU] via INTRAVENOUS

## 2016-03-23 MED ORDER — IOPAMIDOL (ISOVUE-370) INJECTION 76%
INTRAVENOUS | Status: AC
  Filled 2016-03-23: qty 125

## 2016-03-23 MED ORDER — FENTANYL CITRATE (PF) 100 MCG/2ML IJ SOLN
INTRAMUSCULAR | Status: AC
  Filled 2016-03-23: qty 2

## 2016-03-23 MED ORDER — LIDOCAINE HCL (PF) 1 % IJ SOLN
INTRAMUSCULAR | Status: AC
  Filled 2016-03-23: qty 30

## 2016-03-23 MED ORDER — MIDAZOLAM HCL 2 MG/2ML IJ SOLN
INTRAMUSCULAR | Status: DC | PRN
  Administered 2016-03-23: 1 mg via INTRAVENOUS
  Administered 2016-03-23: 2 mg via INTRAVENOUS

## 2016-03-23 MED ORDER — ACETAMINOPHEN 325 MG PO TABS: 650.0000 mg | ORAL_TABLET | Freq: Four times a day (QID) | ORAL | Status: DC | PRN

## 2016-03-23 MED ORDER — HEPARIN (PORCINE) IN NACL 2-0.9 UNIT/ML-% IJ SOLN
INTRAMUSCULAR | Status: DC | PRN
  Administered 2016-03-23: 1500 mL

## 2016-03-23 MED ORDER — VERAPAMIL HCL 2.5 MG/ML IV SOLN
INTRAVENOUS | Status: AC
  Filled 2016-03-23: qty 2

## 2016-03-23 MED ORDER — ONDANSETRON HCL 4 MG/2ML IJ SOLN: 4.0000 mg | Freq: Three times a day (TID) | INTRAMUSCULAR | Status: DC | PRN

## 2016-03-23 MED ORDER — HEPARIN (PORCINE) IN NACL 2-0.9 UNIT/ML-% IJ SOLN
INTRAMUSCULAR | Status: DC | PRN
  Administered 2016-03-23: 10 mL via INTRA_ARTERIAL

## 2016-03-23 MED ORDER — HEPARIN SODIUM (PORCINE) 1000 UNIT/ML IJ SOLN
INTRAMUSCULAR | Status: AC
  Filled 2016-03-23: qty 1

## 2016-03-23 MED ORDER — SODIUM CHLORIDE 0.9 % IV SOLN
INTRAVENOUS | Status: DC
  Administered 2016-03-23: 07:00:00 via INTRAVENOUS

## 2016-03-23 MED ORDER — HEPARIN (PORCINE) IN NACL 2-0.9 UNIT/ML-% IJ SOLN
INTRAMUSCULAR | Status: AC
  Filled 2016-03-23: qty 1500

## 2016-03-23 SURGICAL SUPPLY — 11 items
CATH 5FR JL3.5 JR4 ANG PIG MP (CATHETERS) ×2 IMPLANT
CATH INFINITI 5 FR RCB (CATHETERS) ×2 IMPLANT
DEVICE RAD COMP TR BAND LRG (VASCULAR PRODUCTS) ×2 IMPLANT
GLIDESHEATH SLEND SS 6F .021 (SHEATH) ×2 IMPLANT
KIT HEART LEFT (KITS) ×2 IMPLANT
PACK CARDIAC CATHETERIZATION (CUSTOM PROCEDURE TRAY) ×2 IMPLANT
SHEATH PINNACLE 5F 10CM (SHEATH) ×2 IMPLANT
SYR MEDRAD MARK V 150ML (SYRINGE) ×2 IMPLANT
TRANSDUCER W/STOPCOCK (MISCELLANEOUS) ×4 IMPLANT
TUBING CIL FLEX 10 FLL-RA (TUBING) ×2 IMPLANT
WIRE SAFE-T 1.5MM-J .035X260CM (WIRE) ×2 IMPLANT

## 2016-03-23 NOTE — Interval H&P Note (Signed)
History and Physical Interval Note:  03/23/2016 7:20 AM  [name] Sosa  has presented today for cardiac cath with the diagnosis of unstable angina  The various methods of treatment have been discussed with the patient and family. After consideration of risks, benefits and other options for treatment, the patient has consented to  Procedure(s): Left Heart Cath and Cors/Grafts Angiography (N/A) as a surgical intervention .  The patient's history has been reviewed, patient examined, no change in status, stable for surgery.  I have reviewed the patient's chart and labs.  Questions were answered to the patient's satisfaction.    Cath Lab Visit (complete for each Cath Lab visit)  Clinical Evaluation Leading to the Procedure:   ACS: No.  Non-ACS:    Anginal Classification: CCS III  Anti-ischemic medical therapy: Minimal Therapy (1 class of medications)  Non-Invasive Test Results: No non-invasive testing performed  Prior CABG: Previous CABG         Verne Carrowhristopher Naoki Migliaccio

## 2016-03-23 NOTE — Progress Notes (Signed)
Site area: Right groin  A 5 french arterial sheath was removed  Site Prior to Removal:  Level 0  Pressure Applied For 15 MINUTES    Bedrest Beginning at 0945a  Manual:   Yes.    Patient Status During Pull:  stable  Post Pull Groin Site:  Level 0  Post Pull Instructions Given:  Yes.    Post Pull Pulses Present:  Yes.    Dressing Applied:  Yes.    Comments:  VS remain stable during sheath pull

## 2016-03-23 NOTE — H&P (View-Only) (Signed)
03/16/2016 [name] Sosa   11/03/1955  [id number]  Primary Physician No PCP Per Patient Primary Cardiologist: Dr. Clifton JamesMcAlhany   Reason for Visit/CC: Increasing Angina and Exertional Dyspnea  HPI:  60 yo [place] male with h/o CAD s/p [facility]V [facility]G in [facility] at [facility], HTN, hyperlipidemia here today for 1 year follow up. He is followed by Dr. Clifton JamesMcAlhany. He was seen as a new pt in January 2011 with complaints of chest pain. He had been followed by Dr. Juliann Paresallwood in [place] and had yearly stress tests in that office. Cardiac cath 07/21/09 showed 5/7 patent bypass grafts but good flow into both diagonals (the targets of the occluded grafts). Stress echo October 2011 with great exercise tolerance (14 minutes with no chest pain and no EKG changes suggestive of ischemia). His echo showed septal, apical and inferobasal hypokinesis with mild worsening of the septal hypokinesis with exercise. He was treated medically. Imdur was added, however patient reports that he self discontinued Imdur given intolerance with HAs. He had a stress Myoview 01/2014 that was negative for ischemia. EF was 48% by stress test estimate. He also has ED and Dr. Clifton JamesMcAlhany has written Rx for Viagra, in the past, with the patient understanding not to use within 24 hrs of PRN NTG use. He also has treated HTN and HLD. He is followed medically by Dr. Nehemiah SettlePolite.  He presents to clinic today with complaint of increased frequency of resting SSCP. Described as a burning sensation, which is similar to his angina prior to undergoing CABG. Symptoms usually resolve spontaneously after 5 min, however now occurring more frequently,  multiple times a day. He has also developed increasing exertional dyspnea. He is unable to walk up a flight of stairs w/o symptoms. He has to stop to rest. This was once an easy task for him but now difficult. No exertional dyspnea walking on flat terrain. Exertional symptoms resolve with rest and resting  symptoms spontaneously after 5 minutes or so. He has SL NTG at home, to use if needed.   He was seen by his PCP yesterday for the same symptoms and he was referred to our office for evaluation. 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 He is currently CP free in clinic today.     Current Meds  Medication Sig  . aspirin 81 MG tablet Take 81 mg by mouth daily.   . carvedilol (COREG) 3.125 MG tablet Take 1 tablet (3.125 mg total) by mouth 2 (two) times daily with a meal.  . lisinopril (PRINIVIL,ZESTRIL) 20 MG tablet TAKE ONE TABLET BY MOUTH ONCE DAILY  . nitroGLYCERIN (NITROSTAT) 0.4 MG SL tablet Place 1 tablet (0.4 mg total) under the tongue every 5 (five) minutes as needed for chest pain (up to 3 doses).  . rosuvastatin (CRESTOR) 10 MG tablet Take 1 tablet (10 mg total) by mouth daily.  . [DISCONTINUED] sildenafil (VIAGRA) 100 MG tablet Take 1 tablet (100 mg total) by mouth daily as needed for erectile dysfunction.   No Known Allergies Past Medical History:  Diagnosis Date  . CAD (coronary artery disease)    s/p [facility]  . High cholesterol   . Hyperlipidemia   . Hypertension    Family History  Problem Relation Age of Onset  . Heart failure Mother   . Hypertension Mother   . Heart failure Father   . Hypertension Father   . Hypertension Sister   . Hypertension Brother   .  Heart attack Neg Hx   . Stroke Neg Hx    Past Surgical History:  Procedure Laterality Date  . 4v cabg    . CARDIAC CATHETERIZATION    . CORONARY ARTERY BYPASS GRAFT    . patent grafts    . s/p 7     Social History   Social History  . Marital status: Divorced    Spouse name: N/A  . Number of children: 3  . Years of education: N/A   Occupational History  .  Unemployed   Social History Main Topics  . Smoking status: Former Smoker    Quit date: 06/19/1998  . Smokeless tobacco: Never Used  . Alcohol use No  . Drug use: No  . Sexual activity: Yes   Other Topics  Concern  . Not on file   Social History Narrative  . No narrative on file     Review of Systems: General: negative for chills, fever, night sweats or weight changes.  Cardiovascular: negative for chest pain, dyspnea on exertion, edema, orthopnea, palpitations, paroxysmal nocturnal dyspnea or shortness of breath Dermatological: negative for rash Respiratory: negative for cough or wheezing Urologic: negative for hematuria Abdominal: negative for nausea, vomiting, diarrhea, bright red blood per rectum, melena, or hematemesis Neurologic: negative for visual changes, syncope, or dizziness All other systems reviewed and are otherwise negative except as noted above.   Physical Exam:  Blood pressure 120/70, pulse 68, height 5' 7.5" (1.715 m), weight 134 lb 12.8 oz (61.1 kg), SpO2 97 %.  General appearance: alert, cooperative and no distress Neck: no carotid bruit and no JVD Lungs: clear to auscultation bilaterally Heart: regular rate and rhythm, S1, S2 normal, no murmur, click, rub or gallop Extremities: no LEE Pulses: 2+ and symmetric Skin: warm and dry Neurologic: Grossly normal  EKG NSR. 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   ASSESSMENT AND PLAN:   1. CAD/ Unstable Angina: s/p  7V CABG in 2011. He has had increasing angina. SSC pressure and burning that has been occurring more frequently at rest. Occurs several times a day. Also with new exertional dyspnea walking up stairs, which was once an easier task for him. No exertional dyspnea walking on flat terrain.  Exertional symptoms resolve with rest and resting symptoms resolve spontaneously after 5 minutes or so. His last Cardiac cath was over 6 years ago, 07/21/09, which showed 5/7 patent bypass grafts but good flow into both diagonals (the targets of the occluded grafts). He has not been studied invasively since that time. He has other risk factors including HTN and HLD. There is little room to adjust antianginals. HR is in the 60s  limiting upward titration of his BB. He is also intolerant to Imdur due to HAs. We dicussed risk and benefits and have agreed to refer for definitive LHC to assess for any changes in his native + graft anatomy. Risk reviewed included risk for death, MI, stroke, vascular injury, bleeding, loss of limb, renal injury and allergic reaction. Pt fully understands the risk and benefits and agrees to proceed.  Will arrange with Dr. [name] on 03/23/16 at [facility]. Pt has SL NTG at home. We reviewed instructions for SL NTG use. Pt informed to go to ED if worsening symptoms unrelieved with rest or SLNTG. We will obtain basic pre-cath labs including CBC, BMP and INR today.   PLAN  Definitive LHC +/- PCI at [facility] for unstable angina.   Robbie Lis PA-C 03/16/2016 9:48 AM

## 2016-03-23 NOTE — Discharge Instructions (Signed)
Radial Site Care °Refer to this sheet in the next few weeks. These instructions provide you with information about caring for yourself after your procedure. Your health care provider may also give you more specific instructions. Your treatment has been planned according to current medical practices, but problems sometimes occur. Call your health care provider if you have any problems or questions after your procedure. °WHAT TO EXPECT AFTER THE PROCEDURE °After your procedure, it is typical to have the following: °· Bruising at the radial site that usually fades within 1-2 weeks. °· Blood collecting in the tissue (hematoma) that may be painful to the touch. It should usually decrease in size and tenderness within 1-2 weeks. °HOME CARE INSTRUCTIONS °· Take medicines only as directed by your health care provider. °· You may shower 24-48 hours after the procedure or as directed by your health care provider. Remove the bandage (dressing) and gently wash the site with plain soap and water. Pat the area dry with a clean towel. Do not rub the site, because this may cause bleeding. °· Do not take baths, swim, or use a hot tub until your health care provider approves. °· Check your insertion site every day for redness, swelling, or drainage. °· Do not apply powder or lotion to the site. °· Do not flex or bend the affected arm for 24 hours or as directed by your health care provider. °· Do not push or pull heavy objects with the affected arm for 24 hours or as directed by your health care provider. °· Do not lift over 10 lb (4.5 kg) for 5 days after your procedure or as directed by your health care provider. °· Ask your health care provider when it is okay to: °¨ Return to work or school. °¨ Resume usual physical activities or sports. °¨ Resume sexual activity. °· Do not drive home if you are discharged the same day as the procedure. Have someone else drive you. °· You may drive 24 hours after the procedure unless otherwise  instructed by your health care provider. °· Do not operate machinery or power tools for 24 hours after the procedure. °· If your procedure was done as an outpatient procedure, which means that you went home the same day as your procedure, a responsible adult should be with you for the first 24 hours after you arrive home. °· Keep all follow-up visits as directed by your health care provider. This is important. °SEEK MEDICAL CARE IF: °· You have a fever. °· You have chills. °· You have increased bleeding from the radial site. Hold pressure on the site. °SEEK IMMEDIATE MEDICAL CARE IF: °· You have unusual pain at the radial site. °· You have redness, warmth, or swelling at the radial site. °· You have drainage (other than a small amount of blood on the dressing) from the radial site. °· The radial site is bleeding, and the bleeding does not stop after 30 minutes of holding steady pressure on the site. °· Your arm or hand becomes pale, cool, tingly, or numb. °  °This information is not intended to replace advice given to you by your health care provider. Make sure you discuss any questions you have with your health care provider. °  °Document Released: 07/07/2010 Document Revised: 06/25/2014 Document Reviewed: 12/21/2013 °Elsevier Interactive Patient Education ©2016 Elsevier Inc. ° ° ° ° °Angiogram, Care After °Refer to this sheet in the next few weeks. These instructions provide you with information about caring for yourself after your procedure.   Your health care provider may also give you more specific instructions. Your treatment has been planned according to current medical practices, but problems sometimes occur. Call your health care provider if you have any problems or questions after your procedure. °WHAT TO EXPECT AFTER THE PROCEDURE °After your procedure, it is typical to have the following: °· Bruising at the catheter insertion site that usually fades within 1-2 weeks. °· Blood collecting in the tissue  (hematoma) that may be painful to the touch. It should usually decrease in size and tenderness within 1-2 weeks. °HOME CARE INSTRUCTIONS °· Take medicines only as directed by your health care provider. °· You may shower 24-48 hours after the procedure or as directed by your health care provider. Remove the bandage (dressing) and gently wash the site with plain soap and water. Pat the area dry with a clean towel. Do not rub the site, because this may cause bleeding. °· Do not take baths, swim, or use a hot tub until your health care provider approves. °· Check your insertion site every day for redness, swelling, or drainage. °· Do not apply powder or lotion to the site. °· Do not lift over 10 lb (4.5 kg) for 5 days after your procedure or as directed by your health care provider. °· Ask your health care provider when it is okay to: °¨ Return to work or school. °¨ Resume usual physical activities or sports. °¨ Resume sexual activity. °· Do not drive home if you are discharged the same day as the procedure. Have someone else drive you. °· You may drive 24 hours after the procedure unless otherwise instructed by your health care provider. °· Do not operate machinery or power tools for 24 hours after the procedure or as directed by your health care provider. °· If your procedure was done as an outpatient procedure, which means that you went home the same day as your procedure, a responsible adult should be with you for the first 24 hours after you arrive home. °· Keep all follow-up visits as directed by your health care provider. This is important. °SEEK MEDICAL CARE IF: °· You have a fever. °· You have chills. °· You have increased bleeding from the catheter insertion site. Hold pressure on the site. °SEEK IMMEDIATE MEDICAL CARE IF: °· You have unusual pain at the catheter insertion site. °· You have redness, warmth, or swelling at the catheter insertion site. °· You have drainage (other than a small amount of blood on  the dressing) from the catheter insertion site. °· The catheter insertion site is bleeding, and the bleeding does not stop after 30 minutes of holding steady pressure on the site. °· The area near or just beyond the catheter insertion site becomes pale, cool, tingly, or numb. °  °This information is not intended to replace advice given to you by your health care provider. Make sure you discuss any questions you have with your health care provider. °  °Document Released: 12/21/2004 Document Revised: 06/25/2014 Document Reviewed: 11/05/2012 °Elsevier Interactive Patient Education ©2016 Elsevier Inc. ° ° °

## 2016-03-26 ENCOUNTER — Other Ambulatory Visit: Payer: Self-pay | Admitting: Cardiovascular Disease

## 2016-03-26 ENCOUNTER — Encounter (HOSPITAL_COMMUNITY): Payer: Self-pay | Admitting: Cardiovascular Disease

## 2016-03-28 ENCOUNTER — Other Ambulatory Visit: Payer: Self-pay | Admitting: Cardiovascular Disease

## 2016-03-29 ENCOUNTER — Other Ambulatory Visit: Payer: Self-pay | Admitting: *Deleted

## 2016-03-29 NOTE — Telephone Encounter (Signed)
OK to refill

## 2016-04-18 DIAGNOSIS — I639 Cerebral infarction, unspecified: Secondary | ICD-10-CM

## 2016-04-18 HISTORY — DX: Cerebral infarction, unspecified: I63.9

## 2016-04-20 ENCOUNTER — Encounter: Payer: Self-pay | Admitting: Cardiovascular Disease

## 2016-04-20 ENCOUNTER — Ambulatory Visit (INDEPENDENT_AMBULATORY_CARE_PROVIDER_SITE_OTHER): Payer: BLUE CROSS/BLUE SHIELD | Admitting: Cardiovascular Disease

## 2016-04-20 VITALS — BP 122/70 | HR 70 | Ht 67.5 in | Wt 136.4 lb

## 2016-04-20 DIAGNOSIS — E784 Other hyperlipidemia: Secondary | ICD-10-CM | POA: Diagnosis not present

## 2016-04-20 DIAGNOSIS — I10 Essential (primary) hypertension: Secondary | ICD-10-CM

## 2016-04-20 DIAGNOSIS — I25709 Atherosclerosis of coronary artery bypass graft(s), unspecified, with unspecified angina pectoris: Secondary | ICD-10-CM | POA: Diagnosis not present

## 2016-04-20 DIAGNOSIS — E7849 Other hyperlipidemia: Secondary | ICD-10-CM

## 2016-04-20 MED ORDER — ROSUVASTATIN CALCIUM 10 MG PO TABS: 10.0000 mg | ORAL_TABLET | Freq: Every day | ORAL | 3 refills | Status: DC

## 2016-04-20 MED ORDER — LISINOPRIL 20 MG PO TABS: 20.0000 mg | ORAL_TABLET | Freq: Every day | ORAL | 3 refills | Status: DC

## 2016-04-20 MED ORDER — NITROGLYCERIN 0.4 MG SL SUBL: 0.4000 mg | SUBLINGUAL_TABLET | SUBLINGUAL | 3 refills | Status: DC | PRN

## 2016-04-20 MED ORDER — RANOLAZINE ER 500 MG PO TB12
500.0000 mg | ORAL_TABLET | Freq: Two times a day (BID) | ORAL | 6 refills | Status: DC
Start: 2016-04-20 — End: 2016-10-09

## 2016-04-20 MED ORDER — CARVEDILOL 3.125 MG PO TABS: 3.1250 mg | ORAL_TABLET | Freq: Two times a day (BID) | ORAL | 3 refills | Status: DC

## 2016-04-20 NOTE — Progress Notes (Signed)
Chief Complaint  Patient presents with  . Follow-up    12 MONTH    History of Present Illness: 60 yo [place] male with h/o CAD s/p 7V CABG in 2000 at [facility], HTN, hyperlipidemia here today for follow up. He was seen as a new pt in January 2011 with complaints of chest pain. He had been followed by Dr. Juliann Paresallwood in [place] prior to 2011. Cardiac cath 07/21/09 showed 5/7 patent bypass grafts but good flow into both diagonals (the targets of the occluded grafts). Stress echo October 2011 with great exercise tolerance. His echo showed septal, apical and inferobasal hypokinesis with mild worsening of the septal hypokinesis with exercise. His chest pain was mostly at rest. We started Imdur at that time. Continued to describe daily chest pain with sensation of "spiders crawling over the chest wall". Stress myoview 01/18/14 with no ischemia, LVEF=48%. Last cath October 2017 with patent LIMA to LAD, SVG to OM which filled all of the Circumflex and occluded RCA with occluded SVG to RCA. The RCA filled from left to right collaterals.   He is here today for follow up.  He is having less frequent chest pains. He has no SOB. He has an autistic son who is 59 yo. Wife is still sick with lupus and meningitis. Stress of his family issues has been worrisome for him.   Primary Care Physician: Katy ApoPOLITE,RONALD D, MD   Past Medical History:  Diagnosis Date  . CAD (coronary artery disease)    s/p 7 vessel [facility]  . High cholesterol   . Hyperlipidemia   . Hypertension     Past Surgical History:  Procedure Laterality Date  . 4v cabg    . CARDIAC CATHETERIZATION    . CARDIAC CATHETERIZATION N/A 03/23/2016   Procedure: Left Heart Cath and Cors/Grafts Angiography;  Surgeon: Kathleene Hazelhristopher D Khadir Roam, MD;  [facility];  Service: Cardiovascular;  Laterality: N/A;  . CORONARY ARTERY BYPASS GRAFT    . patent grafts    . s/p 7      Current Outpatient  Prescriptions  Medication Sig Dispense Refill  . aspirin 81 MG chewable tablet Chew 81 mg by mouth daily.    . carvedilol (COREG) 3.125 MG tablet Take 1 tablet (3.125 mg total) by mouth 2 (two) times daily with a meal. 180 tablet 3  . lisinopril (PRINIVIL,ZESTRIL) 20 MG tablet Take 1 tablet (20 mg total) by mouth daily. 90 tablet 3  . nitroGLYCERIN (NITROSTAT) 0.4 MG SL tablet Place 1 tablet (0.4 mg total) under the tongue every 5 (five) minutes as needed for chest pain (up to 3 doses). 25 tablet 3  . rosuvastatin (CRESTOR) 10 MG tablet Take 1 tablet (10 mg total) by mouth daily. 90 tablet 3  . ranolazine (RANEXA) 500 MG 12 hr tablet Take 1 tablet (500 mg total) by mouth 2 (two) times daily. 60 tablet 6   No current facility-administered medications for this visit.     No Known Allergies  Social History   Social History  . Marital status: Divorced    Spouse name: N/A  . Number of children: 3  . Years of education: N/A   Occupational History  .  Unemployed   Social History Main Topics  . Smoking status: Former Smoker    Quit date: 06/19/1998  . Smokeless tobacco: Never Used  . Alcohol use No  . Drug use: No  . Sexual activity: Yes   Other Topics  Concern  . Not on file   Social History Narrative  . No narrative on file    Family History  Problem Relation Age of Onset  . Heart failure Mother   . Hypertension Mother   . Heart failure Father   . Hypertension Father   . Hypertension Sister   . Hypertension Brother   . Heart attack Neg Hx   . Stroke Neg Hx     Review of Systems:  As stated in the HPI and otherwise negative.   BP 122/70   Pulse 70   Ht 5' 7.5" (1.715 m)   Wt 136 lb 6.4 oz (61.9 kg)   BMI 21.05 kg/m   Physical Examination: General: Well developed, well nourished, NAD  HEENT: OP clear, mucus membranes moist  SKIN: warm, dry. No rashes. Neuro: No focal deficits  Musculoskeletal: Muscle strength 5/5 all ext  Psychiatric: Mood and affect normal    Neck: No JVD, no carotid bruits, no thyromegaly, no lymphadenopathy.  Lungs:Clear bilaterally, no wheezes, rhonci, crackles Cardiovascular: Regular rate and rhythm. No murmurs, gallops or rubs. Abdomen:Soft. Bowel sounds present. Non-tender.  Extremities: No lower extremity edema. Pulses are 2 + in the bilateral DP/PT.  Cardiac cath 03/23/16: 1. Triple vessel CAD s/p 5V CABG with 2/5 patent bypass grafts 2. LAD is occluded mid segment. LIMA graft is patent to the LAD 3. Circumflex is occluded proximally. The free RIMA graft to the first OM is patent and fills the entire Circumflex including all marginal branches.  4. The RCA is occluded proximally. The vein graft to the RCA is now occluded (new since last cath). The distal RCA fills from left to right collaterals.  5. Both SVG grafts to Diagonal 1 and Diagonal 2 are known to be occluded 6. Moderate systolic LV dysfunction, LVEF=35-45%.   EKG:  EKG is ordered today. The ekg ordered today demonstrates   Recent Labs: 03/16/2016: BUN 10; Creat 0.79; Hemoglobin 14.9; Platelets 282; Potassium 5.1; Sodium 137   Lipid Panel    Component Value Date/Time   CHOL 154 03/16/2015 1010   TRIG 155.0 (H) 03/16/2015 1010   HDL 47.30 03/16/2015 1010   CHOLHDL 3 03/16/2015 1010   VLDL 31.0 03/16/2015 1010   LDLCALC 76 03/16/2015 1010     Wt Readings from Last 3 Encounters:  04/20/16 136 lb 6.4 oz (61.9 kg)  03/23/16 135 lb (61.2 kg)  03/16/16 134 lb 12.8 oz (61.1 kg)     Other studies Reviewed: Additional studies/ records that were reviewed today include: . Review of the above records demonstrates:    Assessment and Plan:   1. CAD s/p CABG with angina: He has daily chest pains. Cardiac cath October 2017 with adequate coronary flow. Continue current therapy which includes ASA, beta blocker, ARB and statin.  Will start Ranexa 500 mg BID.   2. HTN: BP stable. No changes.   3. HLD: On a statin. Lipids well controlled. Check lipids and LFTs  now.   Current medicines are reviewed at length with the patient today.  The patient does not have concerns regarding medicines.  The following changes have been made:  no change  Labs/ tests ordered today include:   No orders of the defined types were placed in this encounter.   Disposition:   FU with me in 6  months  Signed, Verne Carrowhristopher Kass Herberger, MD 04/20/2016 9:10 AM    [facility] [address], [place], [place]  [address] Phone: [phone]; Fax: [phone]      

## 2016-04-20 NOTE — Patient Instructions (Signed)
Medication Instructions:  Your physician has recommended you make the following change in your medication:  Start Ranexa 500 mg by mouth twice daily.    Labwork: None--We [facility]  Testing/Procedures: none  Follow-Up: Your physician recommends that you schedule a follow-up appointment in: 6 months.  Please call us in about 3 months to schedule this appointment.    Any Other Special Instructions Will Be Listed Below (If Applicable).     If you need a refill on your cardiac medications before your next appointment, please call your pharmacy.

## 2016-04-25 ENCOUNTER — Encounter: Payer: Self-pay | Admitting: Emergency Medicine

## 2016-04-25 ENCOUNTER — Ambulatory Visit
Admission: EM | Admit: 2016-04-25 | Discharge: 2016-04-25 | Payer: BLUE CROSS/BLUE SHIELD | Attending: Family Medicine | Admitting: Family Medicine

## 2016-04-25 DIAGNOSIS — H539 Unspecified visual disturbance: Secondary | ICD-10-CM | POA: Diagnosis not present

## 2016-04-25 NOTE — ED Triage Notes (Signed)
Patient c/o swelling under his right eye.

## 2016-04-25 NOTE — ED Provider Notes (Signed)
[facility]    CSN: [id number] Arrival date & time: 04/25/16  1647     History   Chief Complaint Chief Complaint  Patient presents with  . Eye Problem    HPI [name] Sosa is a 60 y.o. male.   Patient is a 60 year old [place] male with visual disturbance. He states this morning about 8:00 he noticed visual disturbance with his eyes. The visualized disturbance manifested itself differently but several times she saw double vision and blurred vision. States seen was worse with the right eye than with the left. He denies any type of trauma or injury to the right eye. States that his low concerned because he feels that the right eyelid is swollen but nothing foreign has gotten into the eyelid or the eye that he is aware of. He does not feel there is any foreign object in the right eye and he wears glasses to read but states that he does not need glasses to drive. He does report having blurred vision as well today.  Should be noted the patient in his 2940s had major heart issues with eventual bypass. He states that his cardiologist now has month one baby aspirin and that he recently had catheterization. He had some shortness of breath of few months ago and asked why he underwent his catheterization he was told that he just need to increase his physical activity to improve his endurance. He has high cholesterol and hypertension. Multiple members of his family of hypertension heart failure and heart disease. He stopped smoking about 17 years ago no known drug allergies.   The history is provided by the patient. No language interpreter was used.  Eye Problem  Location:  Left eye Quality: Visual disturbance. Severity:  Moderate Onset quality: Started early this morning about 8-10 hours ago. Duration:  10 hours Timing:  Constant Progression:  Waxing and waning Chronicity:  New Context: not burn, not chemical exposure, not contact lens problem, not direct trauma, not foreign body, not  using machinery, not scratch, not smoke exposure and not UV exposure   Relieved by:  Nothing Worsened by:  Nothing Ineffective treatments:  None tried Associated symptoms: blurred vision, decreased vision and double vision   Risk factors: no conjunctival hemorrhage, no exposure to pinkeye, no previous injury to eye, no recent herpes zoster and no recent URI     Past Medical History:  Diagnosis Date  . CAD (coronary artery disease)    s/p 7 vessel [facility]G [facility] at [facility]  . High cholesterol   . Hyperlipidemia   . Hypertension     Patient Active Problem List   Diagnosis Date Noted  . Unstable angina (HCC) 03/16/2016  . Coronary artery disease involving native coronary artery of native heart with unstable angina pectoris (HCC) 03/28/2011  . Hyperlipidemia 07/12/2009  . HYPERTENSION, BENIGN 07/12/2009  . CAD, ARTERY BYPASS GRAFT 07/12/2009  . CHEST PAIN-UNSPECIFIED 06/30/2009    Past Surgical History:  Procedure Laterality Date  . 4v cabg    . CARDIAC CATHETERIZATION    . CARDIAC CATHETERIZATION N/A 03/23/2016   Procedure: Left Heart Cath and Cors/Grafts Angiography;  Surgeon: Kathleene Hazelhristopher D McAlhany, MD;  [facility];  Service: Cardiovascular;  Laterality: N/A;  . CORONARY ARTERY BYPASS GRAFT    . patent grafts    . s/p 7         Home Medications    Prior to Admission medications   Medication Sig Start Date End Date Taking? Authorizing Provider  aspirin 81 MG chewable tablet Chew 81 mg by mouth daily.    Historical Provider, MD  carvedilol (COREG) 3.125 MG tablet Take 1 tablet (3.125 mg total) by mouth 2 (two) times daily with a meal. 04/20/16   Kathleene Hazel, MD  lisinopril (PRINIVIL,ZESTRIL) 20 MG tablet Take 1 tablet (20 mg total) by mouth daily. 04/20/16   Kathleene Hazel, MD  nitroGLYCERIN (NITROSTAT) 0.4 MG SL tablet Place 1 tablet (0.4 mg total) under the tongue every 5 (five) minutes as needed for chest pain (up to 3 doses). 04/20/16    Kathleene Hazel, MD  ranolazine (RANEXA) 500 MG 12 hr tablet Take 1 tablet (500 mg total) by mouth 2 (two) times daily. 04/20/16   Kathleene Hazel, MD  rosuvastatin (CRESTOR) 10 MG tablet Take 1 tablet (10 mg total) by mouth daily. 04/20/16   Kathleene Hazel, MD    Family History Family History  Problem Relation Age of Onset  . Heart failure Mother   . Hypertension Mother   . Heart failure Father   . Hypertension Father   . Hypertension Sister   . Hypertension Brother   . Heart attack Neg Hx   . Stroke Neg Hx     Social History Social History  Substance Use Topics  . Smoking status: Former Smoker    Quit date: 06/19/1998  . Smokeless tobacco: Never Used  . Alcohol use No     Allergies   Patient has no known allergies.   Review of Systems Review of Systems  Eyes: Positive for blurred vision, double vision and visual disturbance.  All other systems reviewed and are negative.    Physical Exam Triage Vital Signs ED Triage Vitals  Enc Vitals Group     BP 04/25/16 1704 (S) (!) 170/98     Pulse Rate 04/25/16 1704 66     Resp 04/25/16 1704 16     Temp 04/25/16 1704 97.8 F (36.6 C)     Temp Source 04/25/16 1704 Oral     SpO2 04/25/16 1704 100 %     Weight 04/25/16 1703 134 lb (60.8 kg)     Height 04/25/16 1703 5\' 8"  (1.727 m)     Head Circumference --      Peak Flow --      Pain Score 04/25/16 1702 2     Pain Loc --      Pain Edu? --      Excl. in GC? --    No data found.   Updated Vital Signs BP (S) (!) 170/98 (BP Location: Left Arm) Comment: Dr. Thurmond Butts notified  Pulse 66   Temp 97.8 F (36.6 C) (Oral)   Resp 16   Ht 5\' 8"  (1.727 m)   Wt 134 lb (60.8 kg)   SpO2 100%   BMI 20.37 kg/m   Visual Acuity Right Eye Distance: 20/50 uncorrected Left Eye Distance: 20 70 uncorrected Bilateral Distance:    Right Eye Near:   Left Eye Near:    Bilateral Near:     Physical Exam  Constitutional: He is oriented to person, place, and time. He  appears well-developed and well-nourished. No distress.  HENT:  Head: Normocephalic and atraumatic.  Right Ear: External ear normal.  Left Ear: External ear normal.  Mouth/Throat: Oropharynx is clear and moist.  Eyes: Conjunctivae and EOM are normal. Pupils are equal, round, and reactive to light.  Fundoscopic exam:      The right eye shows no AV nicking,  no exudate and no hemorrhage.       The left eye shows no AV nicking, no exudate and no hemorrhage.  Patient did have good ocular range of motion  Neck: Normal range of motion. Neck supple.  Musculoskeletal: Normal range of motion. He exhibits no deformity.  Neurological: He is alert and oriented to person, place, and time. No cranial nerve deficit. Coordination normal.  Skin: Skin is warm. He is not diaphoretic.  Vitals reviewed.    UC Treatments / Results  Labs (all labs ordered are listed, but only abnormal results are displayed) Labs Reviewed - No data to display  EKG  EKG Interpretation None       Radiology No results found.  Procedures Procedures (including critical care time)  Medications Ordered in UC Medications - No data to display   Initial Impression / Assessment and Plan / UC Course  I have reviewed the triage vital signs and the nursing notes.  Pertinent labs & imaging results that were available during my care of the patient were reviewed by me and considered in my medical decision making (see chart for details).  Clinical Course    At this time I've explained to him I'm concerned about some type of vascular accident may have occurred affecting his vision. For someone whose had normal vision and not have 20/50 and 20/70 I'm concerned. Recommend [facility] where ophthalmologists will be available for evaluation and he may need to have MRI tonight. Discussed case with the [facility]ist at [facility] Dr. Sheryle Hailiamond he recommended that [facility] as well. Nurse called [facility] [place] informed  them of his planned arrival.  Final Clinical Impressions(s) / UC Diagnoses   Final diagnoses:  Visual disturbance of one eye    New Prescriptions Discharge Medication List as of 04/25/2016  5:48 PM           Hassan RowanEugene Ashlin Kreps, MD 04/25/16 2111

## 2016-04-26 ENCOUNTER — Emergency Department (HOSPITAL_COMMUNITY): Payer: BLUE CROSS/BLUE SHIELD

## 2016-04-26 ENCOUNTER — Encounter (HOSPITAL_COMMUNITY): Payer: Self-pay | Admitting: *Deleted

## 2016-04-26 ENCOUNTER — Telehealth: Payer: Self-pay | Admitting: Cardiovascular Disease

## 2016-04-26 ENCOUNTER — Emergency Department (HOSPITAL_COMMUNITY)
Admission: EM | Admit: 2016-04-26 | Discharge: 2016-04-26 | Disposition: A | Discharge status: Home / Self Care | Payer: BLUE CROSS/BLUE SHIELD | Attending: Emergency Medicine | Admitting: Emergency Medicine

## 2016-04-26 DIAGNOSIS — Z7982 Long term (current) use of aspirin: Secondary | ICD-10-CM | POA: Diagnosis not present

## 2016-04-26 DIAGNOSIS — H532 Diplopia: Secondary | ICD-10-CM | POA: Insufficient documentation

## 2016-04-26 DIAGNOSIS — Z951 Presence of aortocoronary bypass graft: Secondary | ICD-10-CM | POA: Insufficient documentation

## 2016-04-26 DIAGNOSIS — Z87891 Personal history of nicotine dependence: Secondary | ICD-10-CM | POA: Insufficient documentation

## 2016-04-26 DIAGNOSIS — I10 Essential (primary) hypertension: Secondary | ICD-10-CM | POA: Insufficient documentation

## 2016-04-26 DIAGNOSIS — I2510 Atherosclerotic heart disease of native coronary artery without angina pectoris: Secondary | ICD-10-CM | POA: Diagnosis not present

## 2016-04-26 DIAGNOSIS — R791 Abnormal coagulation profile: Secondary | ICD-10-CM | POA: Insufficient documentation

## 2016-04-26 DIAGNOSIS — Z79899 Other long term (current) drug therapy: Secondary | ICD-10-CM | POA: Diagnosis not present

## 2016-04-26 DIAGNOSIS — R51 Headache: Secondary | ICD-10-CM | POA: Diagnosis not present

## 2016-04-26 LAB — COMPREHENSIVE METABOLIC PANEL
ALK PHOS: 55 U/L (ref 38–126)
ALT: 28 U/L (ref 17–63)
ANION GAP: 11 (ref 5–15)
AST: 36 U/L (ref 15–41)
Albumin: 4.5 g/dL (ref 3.5–5.0)
BUN: 9 mg/dL (ref 6–20)
CALCIUM: 9.8 mg/dL (ref 8.9–10.3)
CHLORIDE: 100 mmol/L — AB (ref 101–111)
CO2: 27 mmol/L (ref 22–32)
CREATININE: 0.82 mg/dL (ref 0.61–1.24)
Glucose, Bld: 212 mg/dL — ABNORMAL HIGH (ref 65–99)
Potassium: 4.4 mmol/L (ref 3.5–5.1)
SODIUM: 138 mmol/L (ref 135–145)
Total Bilirubin: 1.1 mg/dL (ref 0.3–1.2)
Total Protein: 7.3 g/dL (ref 6.5–8.1)

## 2016-04-26 LAB — CBC
HEMATOCRIT: 44.9 % (ref 39.0–52.0)
Hemoglobin: 15.8 g/dL (ref 13.0–17.0)
MCH: 31.8 pg (ref 26.0–34.0)
MCHC: 35.2 g/dL (ref 30.0–36.0)
MCV: 90.3 fL (ref 78.0–100.0)
PLATELETS: 247 10*3/uL (ref 150–400)
RBC: 4.97 MIL/uL (ref 4.22–5.81)
RDW: 12.5 % (ref 11.5–15.5)
WBC: 8.6 10*3/uL (ref 4.0–10.5)

## 2016-04-26 LAB — DIFFERENTIAL
BASOS PCT: 0 %
Basophils Absolute: 0 10*3/uL (ref 0.0–0.1)
EOS PCT: 6 %
Eosinophils Absolute: 0.5 10*3/uL (ref 0.0–0.7)
Lymphocytes Relative: 25 %
Lymphs Abs: 2.1 10*3/uL (ref 0.7–4.0)
MONO ABS: 0.4 10*3/uL (ref 0.1–1.0)
MONOS PCT: 5 %
Neutro Abs: 5.6 10*3/uL (ref 1.7–7.7)
Neutrophils Relative %: 64 %

## 2016-04-26 LAB — PROTIME-INR
INR: 1.03
PROTHROMBIN TIME: 13.5 s (ref 11.4–15.2)

## 2016-04-26 LAB — CBG MONITORING, ED: GLUCOSE-CAPILLARY: 220 mg/dL — AB (ref 65–99)

## 2016-04-26 LAB — APTT: aPTT: 31 seconds (ref 24–36)

## 2016-04-26 NOTE — Telephone Encounter (Signed)
Mr. [name] Sosa is calling to get his lab results . Please call   Thanks

## 2016-04-26 NOTE — ED Provider Notes (Signed)
[facility] DEPT Provider Note   CSN: [id number] Arrival date & time: 04/26/16  1222     History   Chief Complaint Chief Complaint  Patient presents with  . Diplopia    HPI [name] PerchesSeyed Sosa is a 60 y.o. male.  Patient with history of CABG, hypertension, hyperlipidemia -- presents with complaint of diplopia starting acutely yesterday. Diplopia is binocular. Patient was seen in outside urgent care yesterday and was told to go to the emergency department. He presents today for further evaluation. He has had an occipital headache this morning. No URI symptoms. No difficulty walking. Patient has been compensating by closing one eye. No weakness in arms or legs. No weakness or pain with chewing. No difficulty talking, facial droop. The onset of this condition was acute. The course is constant. Aggravating factors: none. Alleviating factors: none.        Past Medical History:  Diagnosis Date  . CAD (coronary artery disease)    [facility]  . High cholesterol   . Hyperlipidemia   . Hypertension     Patient Active Problem List   Diagnosis Date Noted  . Unstable angina (HCC) 03/16/2016  . Coronary artery disease involving native coronary artery of native heart with unstable angina pectoris (HCC) 03/28/2011  . Hyperlipidemia 07/12/2009  . HYPERTENSION, BENIGN 07/12/2009  . CAD, ARTERY BYPASS GRAFT 07/12/2009  . CHEST PAIN-UNSPECIFIED 06/30/2009    Past Surgical History:  Procedure Laterality Date  . 4v cabg    . CARDIAC CATHETERIZATION    . CARDIAC CATHETERIZATION N/A 03/23/2016   Procedure: Left Heart Cath and Cors/Grafts Angiography;  Surgeon: Kathleene Hazelhristopher D McAlhany, MD;  [facility];  Service: Cardiovascular;  Laterality: N/A;  . CORONARY ARTERY BYPASS GRAFT    . patent grafts    . s/p 7         Home Medications    Prior to Admission medications   Medication Sig Start Date End Date Taking? Authorizing Provider  aspirin 81 MG  chewable tablet Chew 81 mg by mouth daily.    Historical Provider, MD  carvedilol (COREG) 3.125 MG tablet Take 1 tablet (3.125 mg total) by mouth 2 (two) times daily with a meal. 04/20/16   Kathleene Hazelhristopher D McAlhany, MD  lisinopril (PRINIVIL,ZESTRIL) 20 MG tablet Take 1 tablet (20 mg total) by mouth daily. 04/20/16   Kathleene Hazelhristopher D McAlhany, MD  nitroGLYCERIN (NITROSTAT) 0.4 MG SL tablet Place 1 tablet (0.4 mg total) under the tongue every 5 (five) minutes as needed for chest pain (up to 3 doses). 04/20/16   Kathleene Hazelhristopher D McAlhany, MD  ranolazine (RANEXA) 500 MG 12 hr tablet Take 1 tablet (500 mg total) by mouth 2 (two) times daily. 04/20/16   Kathleene Hazelhristopher D McAlhany, MD  rosuvastatin (CRESTOR) 10 MG tablet Take 1 tablet (10 mg total) by mouth daily. 04/20/16   Kathleene Hazelhristopher D McAlhany, MD    Family History Family History  Problem Relation Age of Onset  . Heart failure Mother   . Hypertension Mother   . Heart failure Father   . Hypertension Father   . Hypertension Sister   . Hypertension Brother   . Heart attack Neg Hx   . Stroke Neg Hx     Social History Social History  Substance Use Topics  . Smoking status: Former Smoker    Quit date: 06/19/1998  . Smokeless tobacco: Never Used  . Alcohol use No     Allergies   Patient has no known  allergies.   Review of Systems Review of Systems  Constitutional: Negative for fever.  HENT: Negative for congestion, dental problem, rhinorrhea, sinus pressure and sore throat.   Eyes: Positive for visual disturbance. Negative for photophobia, discharge and redness.  Respiratory: Negative for cough and shortness of breath.   Cardiovascular: Negative for chest pain.  Gastrointestinal: Negative for abdominal pain, diarrhea, nausea and vomiting.  Genitourinary: Negative for dysuria.  Musculoskeletal: Negative for gait problem, myalgias, neck pain and neck stiffness.  Skin: Negative for rash.  Neurological: Positive for headaches. Negative for syncope,  speech difficulty, weakness, light-headedness and numbness.  Psychiatric/Behavioral: Negative for confusion.     Physical Exam Updated Vital Signs BP 137/78   Pulse (!) 59   Temp 98.2 F (36.8 C) (Oral)   Resp 17   Ht 5\' 8"  (1.727 m)   Wt 60.8 kg   SpO2 100%   BMI 20.37 kg/m   Physical Exam  Constitutional: He is oriented to person, place, and time. He appears well-developed and well-nourished.  HENT:  Head: Normocephalic and atraumatic.  Right Ear: Tympanic membrane, external ear and ear canal normal.  Left Ear: Tympanic membrane, external ear and ear canal normal.  Nose: Nose normal.  Mouth/Throat: Uvula is midline, oropharynx is clear and moist and mucous membranes are normal.  Eyes: Conjunctivae, EOM and lids are normal. Pupils are equal, round, and reactive to light. Right eye exhibits no discharge. Left eye exhibits no discharge.  Neck: Normal range of motion. Neck supple.  Cardiovascular: Normal rate, regular rhythm and normal heart sounds.   Pulmonary/Chest: Effort normal and breath sounds normal.  Abdominal: Soft. There is no tenderness.  Musculoskeletal: Normal range of motion.       Cervical back: He exhibits normal range of motion, no tenderness and no bony tenderness.  Neurological: He is alert and oriented to person, place, and time. He has normal strength and normal reflexes. No cranial nerve deficit or sensory deficit. He exhibits normal muscle tone. He displays a negative Romberg sign. Coordination and gait normal. GCS eye subscore is 4. GCS verbal subscore is 5. GCS motor subscore is 6.  Cover/uncover: When left eye is covered, gaze deviates downwards and corrects upwards when eye is uncovered.   Skin: Skin is warm and dry.  Psychiatric: He has a normal mood and affect.  Nursing note and vitals reviewed.    ED Treatments / Results  Labs (all labs ordered are listed, but only abnormal results are displayed) Labs Reviewed  COMPREHENSIVE METABOLIC PANEL -  Abnormal; Notable for the following:       Result Value   Chloride 100 (*)    Glucose, Bld 212 (*)    All other components within normal limits  CBG MONITORING, ED - Abnormal; Notable for the following:    Glucose-Capillary 220 (*)    All other components within normal limits  PROTIME-INR  APTT  CBC  DIFFERENTIAL  CBG MONITORING, ED   ED ECG REPORT   Date: 04/26/2016  Rate: 60  Rhythm: normal sinus rhythm  QRS Axis: normal  Intervals: normal  ST/T Wave abnormalities: normal  Conduction Disutrbances:none  Narrative Interpretation: inferior q-waves present, unchanged  Old EKG Reviewed: unchanged  I have personally reviewed the EKG tracing and agree with the computerized printout as noted.  Radiology Ct Head Wo Contrast  Result Date: 04/26/2016 CLINICAL DATA:  Diplopia. EXAM: CT HEAD WITHOUT CONTRAST TECHNIQUE: Contiguous axial images were obtained from the base of the skull through the vertex without  intravenous contrast. COMPARISON:  03/17/1999 ill FINDINGS: Brain: No evidence for acute hemorrhage, mass lesion, midline shift, hydrocephalus or large infarct. Vascular: No hyperdense vessel or unexpected calcification. Skull: Suspect old fracture involving the left nasal bone. No evidence for a calvarial fracture. Sinuses/Orbits: Mild sinus disease in the right frontal sinus. Mucosal thickening in the ethmoid air cells. Other: None. IMPRESSION: No acute intracranial abnormality. Mild sinus disease. Electronically Signed   By: Richarda OverlieAdam  Henn M.D.   On: 04/26/2016 14:37    Procedures Procedures (including critical care time)  Medications Ordered in ED Medications - No data to display   Initial Impression / Assessment and Plan / ED Course  I have reviewed the triage vital signs and the nursing notes.  Pertinent labs & imaging results that were available during my care of the patient were reviewed by me and considered in my medical decision making (see chart for details).  Clinical  Course    Patient seen and examined. Discussed with Dr. Erma HeritageIsaacs, will need MRI.    Vital signs reviewed and are as follows: BP 137/78   Pulse (!) 59   Temp 98.2 F (36.8 C) (Oral)   Resp 17   Ht 5\' 8"  (1.727 m)   Wt 60.8 kg   SpO2 100%   BMI 20.37 kg/m   9:32 PM MRI neg. Pt stable. Feel safe for discharge to home. Will give referrals for ophthalmology and neurology.  Patient counseled to return if they have weakness in their arms or legs, slurred speech, trouble walking or talking, confusion, trouble with their balance, or if they have any other concerns. Patient verbalizes understanding and agrees with plan.     Final Clinical Impressions(s) / ED Diagnoses   Final diagnoses:  Diplopia   Patients with binocular diplopia and slight left eye deviation. No stroke found on workup tonight. No additional muscle weakness or fatigue suggestive of myasthenia gravis. No ptosis. No indications for admission at this time. Follow-up as above. Return instructions as above.  New Prescriptions New Prescriptions   No medications on file     Renne CriglerJoshua Jaylend Reiland, Cordelia Poche-C 04/26/16 2133    Shaune Pollackameron Isaacs, MD 04/28/16 [phone]

## 2016-04-26 NOTE — ED Notes (Signed)
Patient Alert and oriented X4. Stable and ambulatory. Patient verbalized understanding of the discharge instructions.  Patient belongings were taken by the patient.  

## 2016-04-26 NOTE — ED Notes (Signed)
Patient taken to MRI

## 2016-04-26 NOTE — Discharge Instructions (Signed)
Please read and follow all provided instructions.  Your diagnoses today include:  1. Diplopia     Tests performed today include:  CT of your head which was normal  MRI - which did not show a stroke  Vital signs. See below for your results today.   Medications:   None  Take any prescribed medications only as directed.  Additional information:  Follow any educational materials contained in this packet.  Follow-up instructions: Please follow-up with the ophthalmologist and neurologist for further evaluation of your symptoms.  Return instructions:   Please return to the Emergency Department if you experience worsening symptoms.  Return if the medications do not resolve your headache, if it recurs, or if you have multiple episodes of vomiting or cannot keep down fluids.  Return if you have a change from the usual headache.  RETURN IMMEDIATELY IF you:  Develop a sudden, severe headache  Develop confusion or become poorly responsive or faint  Develop a fever above 100.45F or problem breathing  Have a change in speech, vision, swallowing, or understanding  Develop new weakness, numbness, tingling, incoordination in your arms or legs  Have a seizure  Please return if you have any other emergent concerns.  Additional Information:  Your vital signs today were: BP 147/73    Pulse (!) 55    Temp 98.2 F (36.8 C) (Oral)    Resp 16    Ht 5\' 8"  (1.727 m)    Wt 60.8 kg    SpO2 100%    BMI 20.37 kg/m  If your blood pressure (BP) was elevated above 135/85 this visit, please have this repeated by your doctor within one month. --------------

## 2016-04-26 NOTE — ED Notes (Signed)
Pt has had these symptoms since yesterday and reports that [facility] (he has papers on him for this).  Pt is alert and oriented and ambulatory.

## 2016-04-26 NOTE — ED Notes (Signed)
Patient returned from MRI.

## 2016-04-26 NOTE — ED Triage Notes (Signed)
Pt states double vision (that only happens when he's looking out of both eyes).  [facility].  However, he was told he would have to wait all night, so he went home.  This am when he woke up his he was experiencing occipital headache and nausea.

## 2016-04-26 NOTE — Telephone Encounter (Signed)
Called patient back.  He asked what his glucose was on the scanned in labs from 04/24/16 that were collected on 03/15/16. Provided that result.  Pt was appreciative for the information provided.

## 2016-04-30 ENCOUNTER — Ambulatory Visit: Payer: Self-pay | Admitting: Diagnostic Neuroimaging

## 2016-05-01 ENCOUNTER — Encounter: Payer: Self-pay | Admitting: Diagnostic Neuroimaging

## 2016-05-03 ENCOUNTER — Telehealth: Payer: Self-pay | Admitting: Neurology

## 2016-05-03 ENCOUNTER — Encounter: Payer: Self-pay | Admitting: Neurology

## 2016-05-03 ENCOUNTER — Ambulatory Visit (INDEPENDENT_AMBULATORY_CARE_PROVIDER_SITE_OTHER): Payer: BLUE CROSS/BLUE SHIELD | Admitting: Neurology

## 2016-05-03 VITALS — BP 132/79 | HR 56 | Ht 68.0 in | Wt 136.0 lb

## 2016-05-03 DIAGNOSIS — H4911 Fourth [trochlear] nerve palsy, right eye: Secondary | ICD-10-CM | POA: Diagnosis not present

## 2016-05-03 DIAGNOSIS — H532 Diplopia: Secondary | ICD-10-CM | POA: Diagnosis not present

## 2016-05-03 HISTORY — DX: Fourth (trochlear) nerve palsy, right eye: H49.11

## 2016-05-03 NOTE — Telephone Encounter (Signed)
Patient called 7:49:08 to advise he may be a few minutes past 8:00 arrival time for 8:30 appointment with Dr. Anne HahnWillis this morning but thinks he will make it by 8:00, [facility] [place] [facility] he called in.

## 2016-05-03 NOTE — Progress Notes (Signed)
Reason for visit: Double vision  Referring physician: [place]  [name] is a 60 y.o. male  History of present illness:  [name] Sosa is a 60 year old right-handed gentleman with a history of onset of double vision that began on 04/25/2016. The patient noted some blurring of vision initially but as the day progressed the overt double vision ensued. The patient did not report any eye pain or headache at that time, but he has noted that if he tries to work with the double vision, he will feel somewhat nauseated and the headache will develop. The patient has started patching the eye to prevent the double vision. He has been seen by his ophthalmologist, Dr. Alben SpittleWeaver and he was told that his eyes were functioning normally, but he had a right fourth cranial nerve palsy. The patient denies any numbness or weakness of the face, arms, or legs. He denies any speech changes or difficulty with swallowing. He has not had any changes in balance or difficulty controlling the bowels or the bladder. The patient went to the emergency room on November 9, MRI of the brain at that time was normal. The patient comes to this office for an evaluation. Blood sugars in the emergency room were noted to be around 220. A hemoglobin A1c in January 2017 was 6.0.  Past Medical History:  Diagnosis Date  . CAD (coronary artery disease)    [facility]  . High cholesterol   . Hyperlipidemia   . Hypertension     Past Surgical History:  Procedure Laterality Date  . 4v cabg    . CARDIAC CATHETERIZATION    . CARDIAC CATHETERIZATION N/A 03/23/2016   Procedure: Left Heart Cath and Cors/Grafts Angiography;  Surgeon: Kathleene Hazelhristopher D McAlhany, MD;  [facility];  Service: Cardiovascular;  Laterality: N/A;  . CORONARY ARTERY BYPASS GRAFT    . patent grafts    . s/p 7      Family History  Problem Relation Age of Onset  . Heart failure Mother   . Hypertension Mother   . Heart failure  Father   . Hypertension Father   . Hypertension Sister   . Hypertension Brother   . Heart attack Neg Hx   . Stroke Neg Hx     Social history:  reports that he quit smoking about 17 years ago. He has never used smokeless tobacco. He reports that he does not drink alcohol or use drugs.  Medications:  Prior to Admission medications   Medication Sig Start Date End Date Taking? Authorizing Provider  aspirin 81 MG chewable tablet Chew 81 mg by mouth daily.   Yes Historical Provider, MD  carvedilol (COREG) 3.125 MG tablet Take 1 tablet (3.125 mg total) by mouth 2 (two) times daily with a meal. 04/20/16  Yes Kathleene Hazelhristopher D McAlhany, MD  lisinopril (PRINIVIL,ZESTRIL) 20 MG tablet Take 1 tablet (20 mg total) by mouth daily. 04/20/16  Yes Kathleene Hazelhristopher D McAlhany, MD  nitroGLYCERIN (NITROSTAT) 0.4 MG SL tablet Place 1 tablet (0.4 mg total) under the tongue every 5 (five) minutes as needed for chest pain (up to 3 doses). 04/20/16  Yes Kathleene Hazelhristopher D McAlhany, MD  rosuvastatin (CRESTOR) 10 MG tablet Take 1 tablet (10 mg total) by mouth daily. 04/20/16  Yes Kathleene Hazelhristopher D McAlhany, MD  ranolazine (RANEXA) 500 MG 12 hr tablet Take 1 tablet (500 mg total) by mouth 2 (two) times daily. Patient not taking: Reported on 05/03/2016 04/20/16  Kathleene Hazelhristopher D McAlhany, MD     No Known Allergies  ROS:  Out of a complete 14 system review of symptoms, the patient complains only of the following symptoms, and all other reviewed systems are negative.  Double vision Headache  Blood pressure 132/79, pulse (!) 56, height 5\' 8"  (1.727 m), weight 136 lb (61.7 kg).  Physical Exam  General: The patient is alert and cooperative at the time of the examination.  Eyes: Pupils are equal, round, and reactive to light. Discs are flat bilaterally.  Neck: The neck is supple, no carotid bruits are noted.  Respiratory: The respiratory examination is clear, with the exception of some mild wheezing posteriorly.  Cardiovascular: The  cardiovascular examination reveals a regular rate and rhythm, no obvious murmurs or rubs are noted.  Skin: Extremities are without significant edema.  Neurologic Exam  Mental status: The patient is alert and oriented x 3 at the time of the examination. The patient has apparent normal recent and remote memory, with an apparently normal attention span and concentration ability.  Cranial nerves: Facial symmetry is present. There is good sensation of the face to pinprick and soft touch bilaterally. The strength of the facial muscles and the muscles to head turning and shoulder shrug are normal bilaterally. Speech is well enunciated, no aphasia or dysarthria is noted. Extraocular movements are full. Visual fields are full. The tongue is midline, and the patient has symmetric elevation of the soft palate. No obvious hearing deficits are noted. With head tilting to the left, this subjectively improves the double vision. Tilting the head to the right worsens the double vision.  Motor: The motor testing reveals 5 over 5 strength of all 4 extremities. Good symmetric motor tone is noted throughout.  Sensory: Sensory testing is intact to pinprick, soft touch, vibration sensation, and position sense on all 4 extremities. No evidence of extinction is noted.  Coordination: Cerebellar testing reveals good finger-nose-finger and heel-to-shin bilaterally.  Gait and station: Gait is normal. Tandem gait is normal. Romberg is negative. No drift is seen.  Reflexes: Deep tendon reflexes are symmetric and normal bilaterally. Toes are downgoing bilaterally.   MRI brain 04/26/16:   IMPRESSION: Normal brain MRI.  No acute intracranial process identified.  * MRI scan images were reviewed online. I agree with the written report.     Assessment/Plan:  1. 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 The patient will be set up for further blood  work. He may return to work and full capacity at this time. He will follow-up in about 3 months. The patient will need to see his primary care physician concerning the treatment for diabetes.   Marlan Palau. Keith Willis MD 05/03/2016 8:46 AM  [facility] [address] [place], [place] [id number]  Phone [phone] Fax [phone]

## 2016-05-04 LAB — HEMOGLOBIN A1C
ESTIMATED AVERAGE GLUCOSE: 123 mg/dL
HEMOGLOBIN A1C: 5.9 % — AB (ref 4.8–5.6)

## 2016-05-04 LAB — ACETYLCHOLINE RECEPTOR, BINDING

## 2016-05-04 LAB — VITAMIN B12: VITAMIN B 12: 467 pg/mL (ref 211–946)

## 2016-05-04 LAB — ANGIOTENSIN CONVERTING ENZYME

## 2016-05-04 LAB — TSH: TSH: 0.351 u[IU]/mL — ABNORMAL LOW (ref 0.450–4.500)

## 2016-05-05 ENCOUNTER — Telehealth: Payer: Self-pay | Admitting: Neurology

## 2016-05-05 NOTE — Telephone Encounter (Signed)
    I called the patient. Blood work showed a low TSH and a Hgb A1c of 5.9, suggesting pre-diabetes. Will send blood work to primary MD, thyroid hormone levels may need to be drawn.

## 2016-05-18 ENCOUNTER — Emergency Department (HOSPITAL_COMMUNITY): Payer: BLUE CROSS/BLUE SHIELD

## 2016-05-18 ENCOUNTER — Emergency Department (HOSPITAL_COMMUNITY)
Admission: EM | Admit: 2016-05-18 | Discharge: 2016-05-19 | Discharge status: Against Medical Advice | Payer: BLUE CROSS/BLUE SHIELD | Attending: Emergency Medicine | Admitting: Emergency Medicine

## 2016-05-18 ENCOUNTER — Encounter (HOSPITAL_COMMUNITY): Payer: Self-pay | Admitting: *Deleted

## 2016-05-18 DIAGNOSIS — Z79899 Other long term (current) drug therapy: Secondary | ICD-10-CM | POA: Insufficient documentation

## 2016-05-18 DIAGNOSIS — Z7982 Long term (current) use of aspirin: Secondary | ICD-10-CM | POA: Insufficient documentation

## 2016-05-18 DIAGNOSIS — I2510 Atherosclerotic heart disease of native coronary artery without angina pectoris: Secondary | ICD-10-CM | POA: Insufficient documentation

## 2016-05-18 DIAGNOSIS — Z87891 Personal history of nicotine dependence: Secondary | ICD-10-CM | POA: Diagnosis not present

## 2016-05-18 DIAGNOSIS — I10 Essential (primary) hypertension: Secondary | ICD-10-CM | POA: Diagnosis not present

## 2016-05-18 DIAGNOSIS — Z951 Presence of aortocoronary bypass graft: Secondary | ICD-10-CM | POA: Diagnosis not present

## 2016-05-18 DIAGNOSIS — R4781 Slurred speech: Secondary | ICD-10-CM | POA: Insufficient documentation

## 2016-05-18 DIAGNOSIS — R2990 Unspecified symptoms and signs involving the nervous system: Secondary | ICD-10-CM

## 2016-05-18 DIAGNOSIS — R29810 Facial weakness: Secondary | ICD-10-CM | POA: Insufficient documentation

## 2016-05-18 DIAGNOSIS — Z5181 Encounter for therapeutic drug level monitoring: Secondary | ICD-10-CM | POA: Diagnosis not present

## 2016-05-18 LAB — COMPREHENSIVE METABOLIC PANEL
ALT: 22 U/L (ref 17–63)
AST: 23 U/L (ref 15–41)
Albumin: 3.9 g/dL (ref 3.5–5.0)
Alkaline Phosphatase: 38 U/L (ref 38–126)
Anion gap: 7 (ref 5–15)
BILIRUBIN TOTAL: 0.6 mg/dL (ref 0.3–1.2)
BUN: 16 mg/dL (ref 6–20)
CHLORIDE: 107 mmol/L (ref 101–111)
CO2: 27 mmol/L (ref 22–32)
CREATININE: 0.9 mg/dL (ref 0.61–1.24)
Calcium: 9.3 mg/dL (ref 8.9–10.3)
Glucose, Bld: 102 mg/dL — ABNORMAL HIGH (ref 65–99)
POTASSIUM: 4.3 mmol/L (ref 3.5–5.1)
Sodium: 141 mmol/L (ref 135–145)
TOTAL PROTEIN: 6.5 g/dL (ref 6.5–8.1)

## 2016-05-18 LAB — DIFFERENTIAL
BASOS PCT: 0 %
Basophils Absolute: 0 10*3/uL (ref 0.0–0.1)
EOS ABS: 0.3 10*3/uL (ref 0.0–0.7)
Eosinophils Relative: 5 %
LYMPHS ABS: 2.6 10*3/uL (ref 0.7–4.0)
Lymphocytes Relative: 39 %
MONO ABS: 0.6 10*3/uL (ref 0.1–1.0)
MONOS PCT: 8 %
Neutro Abs: 3.1 10*3/uL (ref 1.7–7.7)
Neutrophils Relative %: 48 %

## 2016-05-18 LAB — PROTIME-INR
INR: 1.03
Prothrombin Time: 13.5 seconds (ref 11.4–15.2)

## 2016-05-18 LAB — CBC
HEMATOCRIT: 39.3 % (ref 39.0–52.0)
Hemoglobin: 13.1 g/dL (ref 13.0–17.0)
MCH: 30.4 pg (ref 26.0–34.0)
MCHC: 33.3 g/dL (ref 30.0–36.0)
MCV: 91.2 fL (ref 78.0–100.0)
Platelets: 251 10*3/uL (ref 150–400)
RBC: 4.31 MIL/uL (ref 4.22–5.81)
RDW: 12.2 % (ref 11.5–15.5)
WBC: 6.5 10*3/uL (ref 4.0–10.5)

## 2016-05-18 LAB — I-STAT TROPONIN, ED: TROPONIN I, POC: 0 ng/mL (ref 0.00–0.08)

## 2016-05-18 LAB — APTT: aPTT: 30 seconds (ref 24–36)

## 2016-05-18 LAB — CBG MONITORING, ED: GLUCOSE-CAPILLARY: 104 mg/dL — AB (ref 65–99)

## 2016-05-18 NOTE — ED Notes (Signed)
Patient transported to MRI 

## 2016-05-18 NOTE — ED Triage Notes (Signed)
Pt back to triage asking if c-t results ready  Told him yes I am unable to give him the results

## 2016-05-18 NOTE — ED Provider Notes (Signed)
TIA sxs, lasted minutes, today at noon-ish, lasted less than 10 min No previous symptoms Visual symptoms (blurry) one month ago, neg MRI CT neg, labs neg H/O vascular dz  Refuses admission tonight for TIA work-up Pending MRI Plan: positive - he will agree to be admitted Negative - d/ch home AMA with neurology f/u  MRI negative for acute process. The patient will be discharged home with neuro follow up per plan of previous treatment team.    Elpidio AnisShari Hervey Wedig, PA-C 05/19/16 [id number]    Tilden FossaElizabeth Rees, MD 05/22/16 [phone]

## 2016-05-18 NOTE — ED Triage Notes (Signed)
Pt reports eating at approx 2pm, had episode of facial droop, slurred speech and headache that lasted approx 10-15 seconds only. No neuro deficits noted at triage, grips equal, no facial droop and speech is clear. Reports similar episode occurred 3 weeks ago and was seen here at that time.

## 2016-05-18 NOTE — ED Provider Notes (Signed)
[facility]-EM[facility]ENCY DEPT Provider Note   CSN: [id number] Arrival date & time: 05/18/16  1811     History   Chief Complaint Chief Complaint  Patient presents with  . Stroke Symptoms    HPI Gerhard PerchesSeyed Kvamme is a 60 y.o. male.  The history is provided by the patient.  Illness  This is a new problem. The current episode started 6 to 12 hours ago. The problem occurs rarely. The problem has been resolved. Pertinent negatives include no chest pain, no abdominal pain, no headaches and no shortness of breath. Associated symptoms comments: Slurred speech and facial droop. Nothing aggravates the symptoms. Nothing relieves the symptoms. He has tried nothing for the symptoms. The treatment provided significant relief.    Past Medical History:  Diagnosis Date  . CAD (coronary artery disease)    [facility]/p 7 [facility]  . High cholesterol   . Hyperlipidemia   . Hypertension   . 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 Diplopia 05/03/2016  . 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 Unstable angina (HCC) 03/16/2016  . Coronary artery disease involving native coronary artery of native heart with unstable angina pectoris (HCC) 03/28/2011  . Hyperlipidemia 07/12/2009  . HYPERTENSION, BENIGN 07/12/2009  . CAD, ARTERY BYPASS GRAFT 07/12/2009  . CHEST PAIN-UNSPECIFIED 06/30/2009    Past Surgical History:  Procedure Laterality Date  . 4v cabg    . CARDIAC CATHETERIZATION    . CARDIAC CATHETERIZATION N/A 03/23/2016   Procedure: Left Heart Cath and Cors/Grafts Angiography;  Surgeon: Kathleene Hazelhristopher D McAlhany, MD;  [facility];  Service: Cardiovascular;  Laterality: N/A;  . CORONARY ARTERY BYPASS GRAFT    . patent grafts    . s/p 7         Home Medications    Prior to Admission medications   Medication Sig Start Date End Date Taking? Authorizing Provider  aspirin 81 MG chewable tablet Chew 81 mg by mouth daily.    Yes Historical Provider, MD  carvedilol (COREG) 3.125 MG tablet Take 1 tablet (3.125 mg total) by mouth 2 (two) times daily with a meal. 04/20/16  Yes Kathleene Hazelhristopher D McAlhany, MD  lisinopril (PRINIVIL,ZESTRIL) 20 MG tablet Take 1 tablet (20 mg total) by mouth daily. 04/20/16  Yes Kathleene Hazelhristopher D McAlhany, MD  nitroGLYCERIN (NITROSTAT) 0.4 MG SL tablet Place 1 tablet (0.4 mg total) under the tongue every 5 (five) minutes as needed for chest pain (up to 3 doses). 04/20/16  Yes Kathleene Hazelhristopher D McAlhany, MD  rosuvastatin (CRESTOR) 10 MG tablet Take 1 tablet (10 mg total) by mouth daily. 04/20/16  Yes Kathleene Hazelhristopher D McAlhany, MD  ranolazine (RANEXA) 500 MG 12 hr tablet Take 1 tablet (500 mg total) by mouth 2 (two) times daily. Patient not taking: Reported on 05/18/2016 04/20/16   Kathleene Hazelhristopher D McAlhany, MD    Family History Family History  Problem Relation Age of Onset  . Heart failure Mother   . Hypertension Mother   . Heart failure Father   . Hypertension Father   . Hypertension Sister   . Hypertension Brother   . Heart attack Neg Hx   . Stroke Neg Hx     Social History Social History  Substance Use Topics  . Smoking status: Former Smoker    Quit date: 06/19/1998  . Smokeless tobacco: Never Used  . Alcohol use No     Allergies   Patient has no known allergies.  Review of Systems Review of Systems  Constitutional: Negative for chills and fever.  HENT: Negative for ear pain and sore throat.   Eyes: Negative for pain and visual disturbance.  Respiratory: Negative for cough and shortness of breath.   Cardiovascular: Negative for chest pain and palpitations.  Gastrointestinal: Negative for abdominal pain and vomiting.  Genitourinary: Negative for dysuria and hematuria.  Musculoskeletal: Negative for arthralgias and back pain.  Skin: Negative for color change and rash.  Neurological: Positive for facial asymmetry and speech difficulty. Negative for seizures, syncope and headaches.  All  other systems reviewed and are negative.    Physical Exam Updated Vital Signs BP 150/87 (BP Location: Right Arm)   Pulse (!) 59   Temp 98.3 F (36.8 C) (Oral)   Resp 17   SpO2 100%   Physical Exam  Constitutional: He is oriented to person, place, and time. He appears well-developed and well-nourished.  HENT:  Head: Normocephalic and atraumatic.  Eyes: Conjunctivae are normal.  Neck: Neck supple.  Cardiovascular: Normal rate and regular rhythm.   No murmur heard. Pulmonary/Chest: Effort normal and breath sounds normal. No respiratory distress.  Abdominal: Soft. There is no tenderness.  Musculoskeletal: He exhibits no edema.  Neurological: He is alert and oriented to person, place, and time. No cranial nerve deficit or sensory deficit. He exhibits normal muscle tone. Coordination and gait normal.  Normal ambulation, without ataxia. No dysmetria no dysdiadochokinesia  Skin: Skin is warm and dry. Capillary refill takes less than 2 seconds.  Psychiatric: He has a normal mood and affect.  Nursing note and vitals reviewed.    ED Treatments / Results  Labs (all labs ordered are listed, but only abnormal results are displayed) Labs Reviewed  COMPREHENSIVE METABOLIC PANEL - Abnormal; Notable for the following:       Result Value   Glucose, Bld 102 (*)    All other components within normal limits  CBG MONITORING, ED - Abnormal; Notable for the following:    Glucose-Capillary 104 (*)    All other components within normal limits  PROTIME-INR  APTT  CBC  DIFFERENTIAL  I-STAT TROPOININ, ED  CBG MONITORING, ED    EKG  EKG Interpretation  Date/Time:  Friday May 18 2016 18:28:47 EST Ventricular Rate:  64 PR Interval:  164 QRS Duration: 96 QT Interval:  418 QTC Calculation: 431 R Axis:   46 Text Interpretation:  Normal sinus rhythm Possible Inferior infarct , age undetermined Cannot rule out Anterior infarct , age undetermined Abnormal ECG No significant change since  last tracing Confirmed by Lincoln Brigham [id number]) on 05/18/2016 11:00:44 PM       Radiology Ct Head Wo Contrast  Result Date: 05/18/2016 CLINICAL DATA:  Left lower lip facial droop and slurred speech which lasted for 15 seconds earlier today. Headache persists. EXAM: CT HEAD WITHOUT CONTRAST TECHNIQUE: Contiguous axial images were obtained from the base of the skull through the vertex without intravenous contrast. COMPARISON:  April 26, 2016 FINDINGS: Brain: No evidence of acute infarction, hemorrhage, hydrocephalus, extra-axial collection or mass lesion/mass effect. Vascular: No hyperdense vessel or unexpected calcification. Skull: Normal. Negative for fracture or focal lesion. Sinuses/Orbits: No acute finding. Other: None. IMPRESSION: No cause for the patient's symptoms identified. Electronically Signed   By: Gerome Sam III M.D   On: 05/18/2016 19:10    Procedures Procedures (including critical care time)  Medications Ordered in ED Medications - No data to display   Initial Impression / Assessment and Plan / ED  Course  I have reviewed the triage vital signs and the nursing notes.  Pertinent labs & imaging results that were available during my care of the patient were reviewed by me and considered in my medical decision making (see chart for details).  Clinical Course    60 year old gentleman comes today with concerns for TIA. He had a brief episode of slurred speech facial droop. He had diplopia about a month ago had an MRI that was negative. His symptoms are different today. He has had coronary artery bypass significant vascular disease. His neurologic exam is unremarkable for any acute findings and cranial nerve motor nerve sensory nerve or coordination. Vital signs are stable he's afebrile. Patient is in need of workup for TIA however he is refusing to stay, he is willing to save her MRI. Conversation with neurology they're on board with the plan either way. Patient care will be handed  off to the next shift. The remainder this patient's care and final disposition please see future team notes. Vital signs stable time handoff. Of note his EKG is unremarkable cranial acute ischemic changes. Labs thus far unremarkable CT head is negative for any acute intracranial abnormalities.  Final Clinical Impressions(s) / ED Diagnoses   Final diagnoses:  Stroke-like symptoms    New Prescriptions New Prescriptions   No medications on file     Cherlynn PerchesEric Tavita Eastham, MD 05/18/16 [id number]    Tilden FossaElizabeth Rees, MD 05/22/16 [phone]

## 2016-05-18 NOTE — ED Notes (Signed)
Pt states 4 hours PTA he had slurred speech and L sided facial droop which has resolved. NO facial droop. No slurred speech. Grip strength equal. Pt is ambulatory, aaox4, equal grip strength.

## 2016-05-18 NOTE — ED Triage Notes (Signed)
The pt is c/o the wait time  He is conncerned abpout having a stroke.  His symptoms have gone away except for a headache I have attempted to keep him waiting  For how long I dont know

## 2016-05-19 NOTE — ED Notes (Signed)
Pt ambulatory to the restroom.  

## 2016-05-19 NOTE — ED Notes (Signed)
Pt returned from MRI and connected to the monitor 

## 2016-05-21 ENCOUNTER — Telehealth: Payer: Self-pay | Admitting: Neurology

## 2016-05-21 DIAGNOSIS — G451 Carotid artery syndrome (hemispheric): Secondary | ICD-10-CM

## 2016-05-21 NOTE — Telephone Encounter (Signed)
   I called the patient. I left a message, I will call back in the morning.

## 2016-05-21 NOTE — Telephone Encounter (Signed)
Pt was seen as a new patient 05/03/16 for double vision. He has a follow-up appt scheduled in Feb. He went to ER last Friday with c/o brief episode of slurred speech and facial droop. He has h/o CABG. Blood glucose was 104, other labs unremarkable. VS and EKG WNL. CT and MRI negative. Pt refused admission for further work-up.  Called pt to offer appt this week for additional neuro eval. Declined at this time, asking if Dr. Anne HahnWillis could order carotid doppler (as recommended by ER provider) prior to coming in for revisit.

## 2016-05-21 NOTE — Telephone Encounter (Signed)
Pt called request appt. He was seen at ED for stroke like symptoms on 05/18/16. Pt refused to stay for TIA work-up, he is [facility]. He was advised he would need to be seen by Dr Anne HahnWillis before orders could be requested. Pt was advised RN will call.

## 2016-05-22 ENCOUNTER — Telehealth: Payer: Self-pay | Admitting: *Deleted

## 2016-05-22 NOTE — Telephone Encounter (Signed)
I called patient. The patient once again did not answer the phone, left a message. He was seen in emergency room on 05/18/2016 with slurred speech and facial droop. MRI of the brain was unremarkable.  I will get a carotid Doppler set up for him, he will remain on aspirin.

## 2016-05-23 ENCOUNTER — Ambulatory Visit (INDEPENDENT_AMBULATORY_CARE_PROVIDER_SITE_OTHER): Payer: BLUE CROSS/BLUE SHIELD

## 2016-05-23 DIAGNOSIS — G451 Carotid artery syndrome (hemispheric): Secondary | ICD-10-CM | POA: Diagnosis not present

## 2016-05-29 ENCOUNTER — Telehealth: Payer: Self-pay | Admitting: Neurology

## 2016-05-29 NOTE — Telephone Encounter (Signed)
I called the patient. 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 We will follow the carotid Doppler study over time, I will call him in one year to repeat the study. The patient is to remain on aspirin.

## 2016-07-12 DIAGNOSIS — H5021 Vertical strabismus, right eye: Secondary | ICD-10-CM | POA: Insufficient documentation

## 2016-07-13 ENCOUNTER — Telehealth: Payer: Self-pay | Admitting: Neurology

## 2016-07-13 NOTE — Telephone Encounter (Signed)
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 No further evaluation was recommended.

## 2016-07-19 ENCOUNTER — Telehealth: Payer: Self-pay | Admitting: Neurology

## 2016-07-19 NOTE — Telephone Encounter (Signed)
Patient calling stating he has been having intermittent speech impediment for a while and seems like he is losing strength in his lower jaw. He says it just happens for no reason. He does have an appointment with Aundra MilletMegan on 08-09-16 but wants to discuss with Dr. Anne HahnWillis.

## 2016-07-19 NOTE — Telephone Encounter (Signed)
I called patient. The patient is describing events of the last couple weeks where he may have some transient problems with speech alteration as if he is having "baby talk". The patient may also note some weakness of the jaw, difficulty closing the mouth.  He claims that his double vision has improved completely associated with a fourth nerve palsy.  In regards to weakness with jaw closure, the one thing that does this the most would be myasthenia gravis. The patient had blood work for this in November 2017 that was unremarkable. We may want to recheck a full panel for myasthenia when he is seen next checking all 3 antibodies.  The patient does have left carotid artery stenosis by carotid Doppler study with 50-69% stenosis on the left. This will be followed over time.

## 2016-08-09 ENCOUNTER — Ambulatory Visit (INDEPENDENT_AMBULATORY_CARE_PROVIDER_SITE_OTHER): Payer: BLUE CROSS/BLUE SHIELD | Admitting: Adult Health

## 2016-08-09 ENCOUNTER — Encounter: Payer: Self-pay | Admitting: Adult Health

## 2016-08-09 VITALS — BP 102/67 | HR 60 | Ht 68.0 in | Wt 119.8 lb

## 2016-08-09 DIAGNOSIS — H4911 Fourth [trochlear] nerve palsy, right eye: Secondary | ICD-10-CM | POA: Diagnosis not present

## 2016-08-09 DIAGNOSIS — M278 Other specified diseases of jaws: Secondary | ICD-10-CM | POA: Diagnosis not present

## 2016-08-09 DIAGNOSIS — R479 Unspecified speech disturbances: Secondary | ICD-10-CM

## 2016-08-09 DIAGNOSIS — R2990 Unspecified symptoms and signs involving the nervous system: Secondary | ICD-10-CM | POA: Diagnosis not present

## 2016-08-09 NOTE — Progress Notes (Signed)
I have read the note, and I agree with the clinical assessment and plan.  Antoinetta Berrones KEITH   

## 2016-08-09 NOTE — Progress Notes (Signed)
PATIENT: [name] Sosa DOB: [birth year]  REASON FOR VISIT: follow up- diplopia, strokelike symptoms HISTORY FROM: patient  HISTORY OF PRESENT ILLNESS: Mr. [name] Sosa is a 61 year old male with a history of double vision and stroke like symptoms. He returns today for follow-up. The patient's double vision has completely resolved. This is thought to be a right fourth nerve palsy. The patient did go to the emergency room with slurred speech and  facial droop on 2 occasions. His workup was relatively unremarkable. He did have carotid Dopplers that showed 50-69% stenosis on the left interal carotid artery.. The patient is on aspirin. The patient reports that he has developed weakness in the lower jaw. He states that this occurs randomly throughout the day. It does not occur with activity such as eating or excessive talking. He reports that the episodes are quite random. He reports that he typically only notices when he starts to talk. He describes his speech as "baby talk." He states that it only last approximately 2 minutes and then he is back to normal. He also notes some excessive salivation at night. The patient's blood pressure today is in normal range. His last hemoglobin A1c was 5.9. The patient would like to recheck blood work today. He returns for an evaluation  HISTORY 05/03/16: Mr. [name] Sosa is a 61 year old right-handed gentleman with a history of onset of double vision that began on 04/25/2016. The patient noted some blurring of vision initially but as the day progressed the overt double vision ensued. The patient did not report any eye pain or headache at that time, but he has noted that if he tries to work with the double vision, he will feel somewhat nauseated and the headache will develop. The patient has started patching the eye to prevent the double vision. He has been seen by his ophthalmologist, Dr. Alben Spittle and he was told that his eyes were functioning normally, but he had a right fourth  cranial nerve palsy. The patient denies any numbness or weakness of the face, arms, or legs. He denies any speech changes or difficulty with swallowing. He has not had any changes in balance or difficulty controlling the bowels or the bladder. The patient went to the emergency room on November 9, MRI of the brain at that time was normal. The patient comes to this office for an evaluation. Blood sugars in the emergency room were noted to be around 220. A hemoglobin A1c in January 2017 was 6.0.   REVIEW OF SYSTEMS: Out of a complete 14 system review of symptoms, the patient complains only of the following symptoms, and all other reviewed systems are negative.  Chest pain, speech difficulty  ALLERGIES: No Known Allergies  HOME MEDICATIONS: Outpatient Medications Prior to Visit  Medication Sig Dispense Refill  . aspirin 81 MG chewable tablet Chew 81 mg by mouth daily.    . carvedilol (COREG) 3.125 MG tablet Take 1 tablet (3.125 mg total) by mouth 2 (two) times daily with a meal. 180 tablet 3  . lisinopril (PRINIVIL,ZESTRIL) 20 MG tablet Take 1 tablet (20 mg total) by mouth daily. 90 tablet 3  . rosuvastatin (CRESTOR) 10 MG tablet Take 1 tablet (10 mg total) by mouth daily. 90 tablet 3  . nitroGLYCERIN (NITROSTAT) 0.4 MG SL tablet Place 1 tablet (0.4 mg total) under the tongue every 5 (five) minutes as needed for chest pain (up to 3 doses). (Patient not taking: Reported on 08/09/2016) 25 tablet 3  . ranolazine (RANEXA) 500 MG 12 hr tablet  Take 1 tablet (500 mg total) by mouth 2 (two) times daily. (Patient not taking: Reported on 05/18/2016) 60 tablet 6   No facility-administered medications prior to visit.     PAST MEDICAL HISTORY: Past Medical History:  Diagnosis Date  . CAD (coronary artery disease)    s/p 7 vessel CABG 2000 at [facility]  . High cholesterol   . Hyperlipidemia   . Hypertension   . 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 4v cabg    . CARDIAC CATHETERIZATION    . CARDIAC CATHETERIZATION N/A 03/23/2016   Procedure: Left Heart Cath and Cors/Grafts Angiography;  Surgeon: Kathleene Hazel, MD;  Location: [facility] INVASIVE CV LAB;  Service: Cardiovascular;  Laterality: N/A;  . CORONARY ARTERY BYPASS GRAFT    . patent grafts    . s/p 7      FAMILY HISTORY: Family History  Problem Relation Age of Onset  . Heart failure Mother   . Hypertension Mother   . Heart failure Father   . Hypertension Father   . Hypertension Sister   . Hypertension Brother   . Heart attack Neg Hx   . Stroke Neg Hx     SOCIAL HISTORY: Social History   Social History  . Marital status: Single    Spouse name: N/A  . Number of children: 3  . Years of education: AS + 2   Occupational History  . [employer]    Social History Main Topics  . Smoking status: Former Smoker    Quit date: 06/19/1998  . Smokeless tobacco: Never Used  . Alcohol use No  . Drug use: No  . Sexual activity: Yes   Other Topics Concern  . Not on file   Social History Narrative   Lives at home alone   Right-handed   Caffeine: 2 cups per day      PHYSICAL EXAM  Vitals:   08/09/16 0928  BP: 102/67  Pulse: 60  Weight: 119 lb 12.8 oz (54.3 kg)  Height: 5\' 8"  (1.727 m)   Body mass index is 18.22 kg/m.  Generalized: Well developed, in no acute distress   Neurological examination  Mentation: Alert oriented to time, place, history taking. Follows all commands speech and language fluent Cranial nerve II-XII: Pupils were equal round reactive to light. Extraocular movements were full, visual field were full on confrontational test. Facial sensation and strength were normal. Uvula tongue midline. Head turning and shoulder shrug  were normal and symmetric. Motor: The motor testing reveals 5 over 5 strength of all 4 extremities. Good symmetric motor tone is noted throughout.  Sensory: Sensory testing is intact to soft touch  on all 4 extremities. No evidence of extinction is noted.  Coordination: Cerebellar testing reveals good finger-nose-finger and heel-to-shin bilaterally.  Gait and station: Gait is normal. Tandem gait is normal. Romberg is negative. No drift is seen.  Reflexes: Deep tendon reflexes are symmetric and normal bilaterally.   DIAGNOSTIC DATA (LABS, IMAGING, TESTING) - I reviewed patient records, labs, notes, testing and imaging myself where available.  Lab Results  Component Value Date   WBC 6.5 05/18/2016   HGB 13.1 05/18/2016   HCT 39.3 05/18/2016   MCV 91.2 05/18/2016   PLT 251 05/18/2016      Component Value Date/Time   NA 141 05/18/2016 1831   K 4.3 05/18/2016 1831   CL 107 05/18/2016 1831   CO2 27 05/18/2016 1831  GLUCOSE 102 (H) 05/18/2016 1831   BUN 16 05/18/2016 1831   CREATININE 0.90 05/18/2016 1831   CREATININE 0.79 03/16/2016 1006   CALCIUM 9.3 05/18/2016 1831   PROT 6.5 05/18/2016 1831   ALBUMIN 3.9 05/18/2016 1831   AST 23 05/18/2016 1831   ALT 22 05/18/2016 1831   ALKPHOS 38 05/18/2016 1831   BILITOT 0.6 05/18/2016 1831   GFRNONAA >60 05/18/2016 1831   GFRAA >60 05/18/2016 1831   Lab Results  Component Value Date   CHOL 154 03/16/2015   HDL 47.30 03/16/2015   LDLCALC 76 03/16/2015   TRIG 155.0 (H) 03/16/2015   CHOLHDL 3 03/16/2015   Lab Results  Component Value Date   HGBA1C 5.9 (H) 05/03/2016   Lab Results  Component Value Date   VITAMINB12 467 05/03/2016   Lab Results  Component Value Date   TSH 0.351 (L) 05/03/2016      ASSESSMENT AND PLAN 61 y.o. year old male  has a past medical history of CAD (coronary artery disease); High cholesterol; Hyperlipidemia; Hypertension; and Trochlear neuropathy, right (05/03/2016). here with:  1. Right fourth nerve palsy 2. Jaw weakness 3. speech difficulty 4. History of strokelike symptoms  The patient's diplopia has completely resolved. He is now having jaw weakness associated with changes with his  speech. We will recheck blood work for myasthenia gravis. The patient has lost quite a bit of weight since he has been monitoring his diet and essentially eliminating carbohydrates. I have advised that he should include carbohydrates in his diet but just choose healthy optionsand manage his portion size. The patient's BMI is slightly low. I will recheck hemoglobin A1c and cholesterol panel. He will return in 6 months or sooner if needed.    Butch PennyMegan Emett Stapel, MSN, NP-C 08/09/2016, 9:37 AM [facility] [address], [address] [place], [place] [address] [phone]

## 2016-08-09 NOTE — Patient Instructions (Signed)
Blood work today Continue Aspirin Increase carbohydrates in diet BP goal <130/90 Cholesterol LDL <70 HgbA1c <6.5 %

## 2016-08-16 ENCOUNTER — Telehealth: Payer: Self-pay | Admitting: *Deleted

## 2016-08-16 LAB — LIPID PANEL
CHOLESTEROL TOTAL: 117 mg/dL (ref 100–199)
Chol/HDL Ratio: 2 ratio units (ref 0.0–5.0)
HDL: 58 mg/dL (ref >39)
LDL CALC: 52 mg/dL (ref 0–99)
TRIGLYCERIDES: 35 mg/dL (ref 0–149)
VLDL CHOLESTEROL CAL: 7 mg/dL (ref 5–40)

## 2016-08-16 LAB — ACETYLCHOLINE RECEPTOR, BINDING: AChR Binding Ab, Serum: <0.03 nmol/L (ref 0.00–0.24)

## 2016-08-16 LAB — ACETYLCHOLINE RECEPTOR, BLOCKING: Acetylchol Block Ab: 11 % (ref 0–25)

## 2016-08-16 LAB — HEMOGLOBIN A1C
ESTIMATED AVERAGE GLUCOSE: 120 mg/dL
Hgb A1c MFr Bld: 5.8 % — ABNORMAL HIGH (ref 4.8–5.6)

## 2016-08-16 LAB — ACETYLCHOLINE RECEPTOR, MODULATING: Acetylcholine Modulat Ab: <12 % (ref 0–20)

## 2016-08-16 NOTE — Telephone Encounter (Signed)
Pt returned RN's call.  msg relayed labs were ok/unremarkable. Pt is requesting to know what A1C and cholesterol is. Please call

## 2016-08-16 NOTE — Telephone Encounter (Signed)
LMVM home # (ok per DPR) that lab results ok/ unremarkable.  Still waiting on one result and when that does will call you back.  Left # if questions.

## 2016-08-16 NOTE — Telephone Encounter (Signed)
-----   Message from Butch PennyMegan Millikan, NP sent at 08/16/2016  9:45 AM EST ----- Lab work unremarkable. Still waiting on 1 to result. Please cal lpatinet.

## 2016-08-16 NOTE — Telephone Encounter (Signed)
Gave results as requested.  Mailed copy to pt.

## 2016-08-23 ENCOUNTER — Telehealth: Payer: Self-pay | Admitting: *Deleted

## 2016-08-23 DIAGNOSIS — R471 Dysarthria and anarthria: Secondary | ICD-10-CM

## 2016-08-23 DIAGNOSIS — G451 Carotid artery syndrome (hemispheric): Secondary | ICD-10-CM

## 2016-08-23 NOTE — Telephone Encounter (Signed)
I called the patient. The patient is been having frequent episodes of slurred speech occurring 4 or 5 times daily lasting for a few moments and then clearing. MRI the brain is been unremarkable, blood work to include a screen for myasthenia was negative, the patient does not have weakness with chewing.  A carotid Doppler study was unremarkable, but I will go ahead and get CT angiogram of the head and neck to exclude a posterior circulation abnormality. The etiology of these events is not clear.

## 2016-08-23 NOTE — Telephone Encounter (Signed)
Dr Anne HahnWillis- how would you like to proceed? Would you like to use a work in slot to fit pt in?

## 2016-08-23 NOTE — Telephone Encounter (Signed)
Per Dolores HooseM Millikan, NP, spoke with patient and informed him that his lab results are all back and normal. Patient questioned "What is next?"  This RN advised that he will follow up in June with Megan. Patient stated his condition of jaw weakness is not getting better. He stated he did not want to see NP but wants to see Dr Anne HahnWillis.  This RN advised that a sooner appointment with Dr Anne HahnWillis is not available. He stated he cannot wait until June to be seen, and he wants to see Dr Anne HahnWillis. This RN stated she will route note to Dr Anne HahnWillis' RN. Patient verbalized understanding, appreciation.

## 2016-08-23 NOTE — Addendum Note (Signed)
Addended by: Stephanie AcreWILLIS, CHARLES on: 08/23/2016 05:22 PM   Modules accepted: Orders

## 2016-08-28 NOTE — Telephone Encounter (Signed)
I was unable to start the CT Angio Neck due to the diagnostic code that is there doesn't work and they needed a code on what to rule out.. I could start the CT Angio Head but since I couldn't proceed with the neck I decided to withdraw the start of that exam so I can do both of the exams at the same time.

## 2016-08-28 NOTE — Telephone Encounter (Signed)
Noted, thank you for your help!  °

## 2016-08-28 NOTE — Addendum Note (Signed)
Addended by: Stephanie AcreWILLIS, Abraham Entwistle on: 08/28/2016 12:10 PM   Modules accepted: Orders

## 2016-08-28 NOTE — Telephone Encounter (Signed)
I called for a peer to peer review, approval number for the CT angiogram of the head and neck is good through 10/26/2016. Approval number is [id number].

## 2016-08-28 NOTE — Telephone Encounter (Signed)
[employer] approved the CT Angio head but not the CT Angio Neck it is needing more clinical information. She won't release the authorization number due to they are linked together. The phone number for the peer to peer is [phone] and the member ID is [id number] & DOB is 06/05/1956. Thank you for your help.

## 2016-08-28 NOTE — Telephone Encounter (Signed)
I have reordered the CT angiogram of the head and neck, diagnosis is TIA.

## 2016-09-04 ENCOUNTER — Other Ambulatory Visit: Payer: BLUE CROSS/BLUE SHIELD

## 2016-09-04 ENCOUNTER — Inpatient Hospital Stay: Admission: RE | Admit: 2016-09-04 | Payer: BLUE CROSS/BLUE SHIELD | Source: Ambulatory Visit

## 2016-09-04 ENCOUNTER — Telehealth: Payer: Self-pay | Admitting: Neurology

## 2016-09-04 NOTE — Telephone Encounter (Addendum)
error 

## 2016-09-04 NOTE — Telephone Encounter (Signed)
Pt called back to schedule CT scan

## 2016-09-04 NOTE — Telephone Encounter (Signed)
Pt is asking for a call to discuss lab work

## 2016-09-05 NOTE — Telephone Encounter (Signed)
I spoke with the patient and he informed me that he wants his CT's at [facility]... I will send the order to [facility].

## 2016-09-06 ENCOUNTER — Other Ambulatory Visit: Payer: BLUE CROSS/BLUE SHIELD

## 2016-09-17 ENCOUNTER — Telehealth: Payer: Self-pay | Admitting: Neurology

## 2016-09-17 DIAGNOSIS — G451 Carotid artery syndrome (hemispheric): Secondary | ICD-10-CM

## 2016-09-17 NOTE — Telephone Encounter (Signed)
I called patient. The patient had a CT angiogram that showed an ulcerative plaque in the left carotid bifurcation with 66-70% stenosis. The patient continues to have intermittent episodes of speech alteration with slurring of speech and what he calls "baby talk" speech. Last such episode was one week ago.  The CT angiogram also shows some bilateral cavernous and supraclinoid internal carotid artery stenosis and a moderate to severe range on the right side and moderate on the left. The right carotid artery has 50% stenosis of the bifurcation.  I discussed a possible referral to vascular surgery for the left carotid disease, the patient is amenable to this.

## 2016-09-19 ENCOUNTER — Telehealth (HOSPITAL_COMMUNITY): Payer: Self-pay | Admitting: *Deleted

## 2016-09-19 NOTE — Telephone Encounter (Signed)
I spoke with pt who reports he may need carotid artery surgery. Pt is scheduled to see Dr.Chen at [facility] for consult.  Pt is calling to see who Dr. [name] would recommend he see.

## 2016-09-19 NOTE — Telephone Encounter (Signed)
Patient was transferred from [phone] to our triage line and patient left message asking for someone to call him back.  This is not a patient in the HF clinic will send to [facility] to return patient's call.

## 2016-09-24 ENCOUNTER — Encounter: Payer: Self-pay | Admitting: Vascular Surgery

## 2016-09-24 NOTE — Telephone Encounter (Signed)
Can we let him know that I think Dr. Imogene Burn is a Tax adviser. Going to [facility] is a good choice. Thayer Ohm

## 2016-09-24 NOTE — Telephone Encounter (Signed)
Left pt a message to call back. 

## 2016-09-25 ENCOUNTER — Telehealth: Payer: Self-pay | Admitting: Cardiovascular Disease

## 2016-09-25 NOTE — Telephone Encounter (Signed)
Follow Up    Pt returning phone call. Requesting call back..See notes below

## 2016-09-25 NOTE — Telephone Encounter (Signed)
Reviewed Dr. Gibson Ramp advice about patient seeing Dr. Imogene Burn at [facility] (from telephone encounter dated 4/4). Patient was very grateful for the call and asked me to forward his thanks to Dr. [name].

## 2016-10-05 ENCOUNTER — Other Ambulatory Visit: Payer: Self-pay

## 2016-10-05 ENCOUNTER — Ambulatory Visit (INDEPENDENT_AMBULATORY_CARE_PROVIDER_SITE_OTHER): Payer: BLUE CROSS/BLUE SHIELD | Admitting: Vascular Surgery

## 2016-10-05 ENCOUNTER — Encounter: Payer: Self-pay | Admitting: Vascular Surgery

## 2016-10-05 DIAGNOSIS — I779 Disorder of arteries and arterioles, unspecified: Secondary | ICD-10-CM | POA: Diagnosis not present

## 2016-10-05 DIAGNOSIS — I739 Peripheral vascular disease, unspecified: Principal | ICD-10-CM

## 2016-10-05 DIAGNOSIS — I639 Cerebral infarction, unspecified: Secondary | ICD-10-CM | POA: Insufficient documentation

## 2016-10-05 NOTE — Progress Notes (Signed)
New Carotid Patient  Referred by:  Renford Dills, MD [address] [place], [place] [address]   Reason for referral: Recurrent TIA   History of Present Illness   [name] Sosa is a 61 y.o. (21-Jun-1955) male who presents with chief complaint: recurrent TIA.  Previous carotid studies demonstrated: RICA <50% stenosis, LICA >70% stenosis.  Patient has history of TIA symptoms.  The patient initially had diplopia for about 4 months which resolved spontaneously.  He then had episodes of slurred speech.  This was short lived and resolved.  This has recurred multiple times with the most recent in the last week.  The patient has never had amaurosis fugax or monocular blindness.  The patient has never had facial drooping or hemiplegia.  The patient has never had receptive aphasia.   The patient's previous neurologic deficits have resolved.  The patient's risks factors for carotid disease include: HLD, HTN, and prior smoking.  Reviewing the neurologist notes, the patient was previously diagnosed with a Trochlear neuropathy which has resolved at this point.  The patient also had reported facial drooping with these TIA episodes.  Outside CTA reported demonstrated some ulceration in the L carotid plaque.  Past Medical History:  Diagnosis Date  . CAD (coronary artery disease)    s/p 7 vessel CABG 2000 at [facility]  . High cholesterol   . Hyperlipidemia   . Hypertension   . 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 4v cabg    . CARDIAC CATHETERIZATION    . CARDIAC CATHETERIZATION N/A 03/23/2016   Procedure: Left Heart Cath and Cors/Grafts Angiography;  Surgeon: Kathleene Hazel, MD;  Location: [facility] INVASIVE CV LAB;  Service: Cardiovascular;  Laterality: N/A;  . CORONARY ARTERY BYPASS GRAFT    . patent grafts    . s/p 7      Social History   Social History  . Marital status: Single    Spouse name: N/A  . Number of children: 3   . Years of education: AS + 2   Occupational History  . [employer]    Social History Main Topics  . Smoking status: Former Smoker    Quit date: 06/19/1998  . Smokeless tobacco: Never Used  . Alcohol use No  . Drug use: No  . Sexual activity: Yes   Other Topics Concern  . Not on file   Social History Narrative   Lives at home alone   Right-handed   Caffeine: 2 cups per day    Family History  Problem Relation Age of Onset  . Heart failure Mother   . Hypertension Mother   . Heart failure Father   . Hypertension Father   . Heart disease Father     before age 38  . Hypertension Sister   . Hypertension Brother   . Heart attack Neg Hx   . Stroke Neg Hx     Current Outpatient Prescriptions  Medication Sig Dispense Refill  . aspirin 81 MG chewable tablet Chew 81 mg by mouth daily.    . carvedilol (COREG) 3.125 MG tablet Take 1 tablet (3.125 mg total) by mouth 2 (two) times daily with a meal. 180 tablet 3  . lisinopril (PRINIVIL,ZESTRIL) 20 MG tablet Take 1 tablet (20 mg total) by mouth daily. 90 tablet 3  . rosuvastatin (CRESTOR) 10 MG tablet Take 1 tablet (10 mg total) by mouth daily. 90 tablet 3  . nitroGLYCERIN (NITROSTAT) 0.4 MG  SL tablet Place 1 tablet (0.4 mg total) under the tongue every 5 (five) minutes as needed for chest pain (up to 3 doses). (Patient not taking: Reported on 08/09/2016) 25 tablet 3  . ranolazine (RANEXA) 500 MG 12 hr tablet Take 1 tablet (500 mg total) by mouth 2 (two) times daily. (Patient not taking: Reported on 05/18/2016) 60 tablet 6   No current facility-administered medications for this visit.     No Known Allergies   REVIEW OF SYSTEMS:   Cardiac:  positive for: no symptoms, negative for: Chest pain or chest pressure, Shortness of breath upon exertion and Shortness of breath when lying flat,   Vascular:  positive for: no symptoms,  negative for: Pain in calf, thigh, or hip brought on by ambulation, Pain in feet at night that wakes you up  from your sleep, Blood clot in your veins and Leg swelling  Pulmonary:  positive for: no symptoms,  negative for: Oxygen at home, Productive cough and Wheezing  Neurologic:  positive for: Sudden onset of difficulty speaking or slurred speech, episode of diploia negative for: Sudden weakness in arms or legs, Sudden numbness in arms or legs, Temporary loss of vision in one eye and Problems with dizziness  Gastrointestinal:  positive for: no symptoms, negative for: Blood in stool and Vomited blood  Genitourinary:  positive for: no symptoms, negative for: Burning when urinating and Blood in urine  Psychiatric:  positive for: no symptoms,  negative for: Major depression  Hematologic:  positive for: no symptoms,  negative for: negative for: Bleeding problems and Problems with blood clotting too easily  Dermatologic:  positive for: no symptoms, negative for: Rashes or ulcers  Constitutional:  positive for: no symptoms, negative for: Fever or chills  Ear/Nose/Throat:  positive for: no symptoms, negative for: Change in hearing, Nose bleeds and Sore throat  Musculoskeletal:  positive for: no symptoms, negative for: Back pain, Joint pain and Muscle pain   For VQI Use Only   PRE-ADM LIVING: Home  AMB STATUS: Ambulatory  CAD Sx: History of MI, but no symptoms No MI within 6 months  PRIOR CHF: None  STRESS TEST: No   Physical Examination   Vitals:   10/05/16 1004 10/05/16 1005  BP: 118/78 123/75  Pulse: 62   Resp: 18   Temp: 97.2 F (36.2 C)   TempSrc: Oral   SpO2: 100%   Weight: 122 lb 3.2 oz (55.4 kg)   Height:  (1.727 m)     Body mass index is 18.58 kg/m.  General Alert, O x 3, WD, NAD  Head [place]/AT,    Ear/Nose/Throat Hearing grossly intact, nares without erythema or drainage, oropharynx without Erythema or Exudate, Mallampati score: 3, Dentition intact  Eyes PERRLA, EOMI,    Neck Supple, mid-line trachea,    Pulmonary Sym exp, good B air movt,  CTA B  Cardiac RRR, Nl S1, S2, no Murmurs, No rubs, No S3,S4  Vascular Vessel Right Left  Radial Palpable Palpable  Brachial Palpable Palpable  Carotid Palpable, No Bruit Palpable, No Bruit  Aorta Not palpable N/A  Femoral Palpable Palpable  Popliteal Not palpable Not palpable  PT Palpable Palpable  DP Palpable Palpable    Gastrointestinal soft, non-distended, non-tender to palpation, No guarding or rebound, no HSM, no masses, no CVAT B, No palpable prominent aortic pulse,    Musculoskeletal M/S 5/5 throughout  , Extremities without ischemic changes  , No edema present, No obvious varicosities , No Lipodermatosclerosis present  Neurologic Cranial  nerves 2-12 intact  , Pain and light touch intact in extremities  , Motor exam as listed above  Psychiatric Judgement intact, Mood & affect appropriate for pt's clinical situation  Dermatologic See M/S exam for extremity exam, No rashes otherwise noted  Lymphatic  Palpable lymph nodes: None    Radiology   Outside CTA Neck (09/14/16)  Ulcerative plaque at the left carotid bifurcation resulting in moderate to severe stenosis at the origin of the left ICA, appropriately 66-70%  Plaque within the bilateral cavernous and supraclinoid ICAs.  Apparent moderate to severe stenosis within the right supraclinoid ICA at least moderate stenosis on the left.  Evaluation of this region is somewhat limited secondary to motion artifact.  Plaque at the right carotid bifurcation with no more than 50% stenosis.  Moderate stenosis at the origin of the nondominant left vertebral artery.  Unfortunately the timing on the study is off which compromises the quality of the images.  I feel a second study is necessary   Outside Studies/Documentation   4 pages of outside documents were reviewed including: outside CTA neck report, neurology chart   Medical Decision Making    Jerry Clyne is a 61 y.o. male who presents with: possibly sx L ICA stenosis >70%,  multivessel CAD s/p CABG x 5v with 3/5 graft occluded, asx R ICA stenosis ~50%, possible intracranial disease   Based on the patient's vascular studies and examination, I have offered the patient: B carotid and cerebral angiogram.  As noted above, I don't think the CTA neck was timed properly so a second study is needed. I discussed with the patient the nature of angiographic procedures, especially the limited patencies of any endovascular intervention.   The patient is aware of that the risks of an angiographic procedure include but are not limited to: bleeding, infection, access site complications, renal failure, embolization, rupture of vessel, dissection, arteriovenous fistula, possible need for emergent surgical intervention, possible need for surgical procedures to treat the patient's pathology, anaphylactic reaction to contrast, and stroke and death.   The patient is aware of the risks and agrees to proceed. I discussed in depth with the patient the nature of atherosclerosis, and emphasized the importance of maximal medical management including strict control of blood pressure, blood glucose, and lipid levels, obtaining regular exercise, antiplatelet agents, and cessation of smoking.   The patient is currently on a statin: Crestor.  The patient is currently on an anti-platelet: ASA.  The patient is aware that without maximal medical management the underlying atherosclerotic disease process will progress, limiting the benefit of any interventions.  Thank you for allowing [place] to participate in this patient's care.   Leonides Sake, MD, FACS [facility] Office: [phone] Pager: [phone]  10/05/2016, 10:13 AM

## 2016-10-11 ENCOUNTER — Ambulatory Visit (HOSPITAL_COMMUNITY)
Admission: RE | Admit: 2016-10-11 | Discharge: 2016-10-11 | Disposition: A | Discharge status: Home / Self Care | Payer: Medicaid Other | Source: Ambulatory Visit | Attending: Vascular Surgery | Admitting: Vascular Surgery

## 2016-10-11 ENCOUNTER — Encounter (HOSPITAL_COMMUNITY)
Admission: RE | Disposition: A | Discharge status: Home / Self Care | Payer: Self-pay | Source: Ambulatory Visit | Attending: Vascular Surgery

## 2016-10-11 ENCOUNTER — Telehealth: Payer: Self-pay | Admitting: Vascular Surgery

## 2016-10-11 DIAGNOSIS — Z8249 Family history of ischemic heart disease and other diseases of the circulatory system: Secondary | ICD-10-CM | POA: Insufficient documentation

## 2016-10-11 DIAGNOSIS — I252 Old myocardial infarction: Secondary | ICD-10-CM | POA: Diagnosis not present

## 2016-10-11 DIAGNOSIS — I10 Essential (primary) hypertension: Secondary | ICD-10-CM | POA: Insufficient documentation

## 2016-10-11 DIAGNOSIS — I6523 Occlusion and stenosis of bilateral carotid arteries: Secondary | ICD-10-CM | POA: Insufficient documentation

## 2016-10-11 DIAGNOSIS — Z951 Presence of aortocoronary bypass graft: Secondary | ICD-10-CM | POA: Diagnosis not present

## 2016-10-11 DIAGNOSIS — Z7982 Long term (current) use of aspirin: Secondary | ICD-10-CM | POA: Insufficient documentation

## 2016-10-11 DIAGNOSIS — E7800 Pure hypercholesterolemia, unspecified: Secondary | ICD-10-CM | POA: Insufficient documentation

## 2016-10-11 DIAGNOSIS — I639 Cerebral infarction, unspecified: Secondary | ICD-10-CM | POA: Diagnosis present

## 2016-10-11 DIAGNOSIS — Z8673 Personal history of transient ischemic attack (TIA), and cerebral infarction without residual deficits: Secondary | ICD-10-CM | POA: Diagnosis not present

## 2016-10-11 DIAGNOSIS — I779 Disorder of arteries and arterioles, unspecified: Secondary | ICD-10-CM | POA: Diagnosis present

## 2016-10-11 DIAGNOSIS — I2510 Atherosclerotic heart disease of native coronary artery without angina pectoris: Secondary | ICD-10-CM | POA: Diagnosis not present

## 2016-10-11 DIAGNOSIS — I739 Peripheral vascular disease, unspecified: Secondary | ICD-10-CM

## 2016-10-11 DIAGNOSIS — Z87891 Personal history of nicotine dependence: Secondary | ICD-10-CM | POA: Diagnosis not present

## 2016-10-11 DIAGNOSIS — I6522 Occlusion and stenosis of left carotid artery: Secondary | ICD-10-CM | POA: Diagnosis not present

## 2016-10-11 HISTORY — PX: CAROTID ANGIOGRAPHY: CATH118230

## 2016-10-11 LAB — POCT I-STAT, CHEM 8
BUN: 17 mg/dL (ref 6–20)
CHLORIDE: 105 mmol/L (ref 101–111)
CREATININE: 0.7 mg/dL (ref 0.61–1.24)
Calcium, Ion: 1.18 mmol/L (ref 1.15–1.40)
GLUCOSE: 98 mg/dL (ref 65–99)
HCT: 41 % (ref 39.0–52.0)
Hemoglobin: 13.9 g/dL (ref 13.0–17.0)
POTASSIUM: 4 mmol/L (ref 3.5–5.1)
Sodium: 140 mmol/L (ref 135–145)
TCO2: 27 mmol/L (ref 0–100)

## 2016-10-11 SURGERY — CAROTID ANGIOGRAPHY
Anesthesia: LOCAL

## 2016-10-11 MED ORDER — LIDOCAINE HCL (PF) 1 % IJ SOLN
INTRAMUSCULAR | Status: DC | PRN
  Administered 2016-10-11: 20 mL via INTRADERMAL

## 2016-10-11 MED ORDER — SODIUM CHLORIDE 0.9 % IV SOLN: 1.0000 mL/kg/h | INTRAVENOUS | Status: DC

## 2016-10-11 MED ORDER — ACETAMINOPHEN 325 MG PO TABS: 650.0000 mg | ORAL_TABLET | ORAL | Status: DC | PRN

## 2016-10-11 MED ORDER — HYDRALAZINE HCL 20 MG/ML IJ SOLN
10.0000 mg | INTRAMUSCULAR | Status: DC | PRN
Start: 2016-10-11 — End: 2016-10-11

## 2016-10-11 MED ORDER — IODIXANOL 320 MG/ML IV SOLN
INTRAVENOUS | Status: DC | PRN
  Administered 2016-10-11: 91 mL via INTRAVENOUS

## 2016-10-11 MED ORDER — HEPARIN (PORCINE) IN NACL 2-0.9 UNIT/ML-% IJ SOLN
INTRAMUSCULAR | Status: DC | PRN
Start: 2016-10-11 — End: 2016-10-11
  Administered 2016-10-11: 1000 mL

## 2016-10-11 MED ORDER — SODIUM CHLORIDE 0.9 % IV SOLN
INTRAVENOUS | Status: DC
  Administered 2016-10-11: 11:00:00 via INTRAVENOUS

## 2016-10-11 MED ORDER — HEPARIN (PORCINE) IN NACL 2-0.9 UNIT/ML-% IJ SOLN
INTRAMUSCULAR | Status: AC
  Filled 2016-10-11: qty 1000

## 2016-10-11 MED ORDER — LIDOCAINE HCL 1 % IJ SOLN
INTRAMUSCULAR | Status: AC
  Filled 2016-10-11: qty 20

## 2016-10-11 SURGICAL SUPPLY — 10 items
CATH ANGIO 5F BER2 100CM (CATHETERS) ×2 IMPLANT
CATH ANGIO 5F PIGTAIL 100CM (CATHETERS) ×2 IMPLANT
CATH HEADHUNTER H1 5F 100CM (CATHETERS) ×2 IMPLANT
KIT MICROINTRODUCER STIFF 5F (SHEATH) ×2 IMPLANT
KIT PV (KITS) ×2 IMPLANT
SHEATH PINNACLE 5F 10CM (SHEATH) ×2 IMPLANT
SYR MEDRAD MARK V 150ML (SYRINGE) ×2 IMPLANT
TRANSDUCER W/STOPCOCK (MISCELLANEOUS) ×2 IMPLANT
TRAY PV CATH (CUSTOM PROCEDURE TRAY) ×2 IMPLANT
WIRE BENTSON .035X145CM (WIRE) ×2 IMPLANT

## 2016-10-11 NOTE — Telephone Encounter (Signed)
Scheduled 5/11@3pm

## 2016-10-11 NOTE — Interval H&P Note (Signed)
History and Physical Interval Note:  10/11/2016 10:16 AM  [name] Sosa  has presented today for surgery, with the diagnosis of bilateral carotid stenosis  The various methods of treatment have been discussed with the patient and family. After consideration of risks, benefits and other options for treatment, the patient has consented to  Procedure(s): Carotid Angiography / Cerebral angiogram (N/A) as a surgical intervention .  The patient's history has been reviewed, patient examined, no change in status, stable for surgery.  I have reviewed the patient's chart and labs.  Questions were answered to the patient's satisfaction.     Leonides Sake

## 2016-10-11 NOTE — Progress Notes (Signed)
5 FR right femoral arterial sheath removed with manual pressure x 25 minutes without complication. Patient instructions given at this time. Groin site is a level 0.

## 2016-10-11 NOTE — H&P (View-Only) (Signed)
  New Carotid Patient  Referred by:  Ronald Polite, MD [address] [place], [place] [address]   Reason for referral: Recurrent TIA   History of Present Illness   [name] Sosa is a 61 y.o. (01/30/1956) male who presents with chief complaint: recurrent TIA.  Previous carotid studies demonstrated: RICA <50% stenosis, LICA >70% stenosis.  Patient has history of TIA symptoms.  The patient initially had diplopia for about 4 months which resolved spontaneously.  He then had episodes of slurred speech.  This was short lived and resolved.  This has recurred multiple times with the most recent in the last week.  The patient has never had amaurosis fugax or monocular blindness.  The patient has never had facial drooping or hemiplegia.  The patient has never had receptive aphasia.   The patient's previous neurologic deficits have resolved.  The patient's risks factors for carotid disease include: HLD, HTN, and prior smoking.  Reviewing the neurologist notes, the patient was previously diagnosed with a Trochlear neuropathy which has resolved at this point.  The patient also had reported facial drooping with these TIA episodes.  Outside CTA reported demonstrated some ulceration in the L carotid plaque.  Past Medical History:  Diagnosis Date  . CAD (coronary artery disease)    s/p 7 vessel CABG 2000 at [facility]  . High cholesterol   . Hyperlipidemia   . Hypertension   . 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 4v cabg    . CARDIAC CATHETERIZATION    . CARDIAC CATHETERIZATION N/A 03/23/2016   Procedure: Left Heart Cath and Cors/Grafts Angiography;  Surgeon: Christopher D McAlhany, MD;  Location: [facility] INVASIVE CV LAB;  Service: Cardiovascular;  Laterality: N/A;  . CORONARY ARTERY BYPASS GRAFT    . patent grafts    . s/p 7      Social History   Social History  . Marital status: Single    Spouse name: N/A  . Number of children: 3   . Years of education: AS + 2   Occupational History  . [employer]    Social History Main Topics  . Smoking status: Former Smoker    Quit date: 06/19/1998  . Smokeless tobacco: Never Used  . Alcohol use No  . Drug use: No  . Sexual activity: Yes   Other Topics Concern  . Not on file   Social History Narrative   Lives at home alone   Right-handed   Caffeine: 2 cups per day    Family History  Problem Relation Age of Onset  . Heart failure Mother   . Hypertension Mother   . Heart failure Father   . Hypertension Father   . Heart disease Father     before age 60  . Hypertension Sister   . Hypertension Brother   . Heart attack Neg Hx   . Stroke Neg Hx     Current Outpatient Prescriptions  Medication Sig Dispense Refill  . aspirin 81 MG chewable tablet Chew 81 mg by mouth daily.    . carvedilol (COREG) 3.125 MG tablet Take 1 tablet (3.125 mg total) by mouth 2 (two) times daily with a meal. 180 tablet 3  . lisinopril (PRINIVIL,ZESTRIL) 20 MG tablet Take 1 tablet (20 mg total) by mouth daily. 90 tablet 3  . rosuvastatin (CRESTOR) 10 MG tablet Take 1 tablet (10 mg total) by mouth daily. 90 tablet 3  . nitroGLYCERIN (NITROSTAT) 0.4 MG   SL tablet Place 1 tablet (0.4 mg total) under the tongue every 5 (five) minutes as needed for chest pain (up to 3 doses). (Patient not taking: Reported on 08/09/2016) 25 tablet 3  . ranolazine (RANEXA) 500 MG 12 hr tablet Take 1 tablet (500 mg total) by mouth 2 (two) times daily. (Patient not taking: Reported on 05/18/2016) 60 tablet 6   No current facility-administered medications for this visit.     No Known Allergies   REVIEW OF SYSTEMS:   Cardiac:  positive for: no symptoms, negative for: Chest pain or chest pressure, Shortness of breath upon exertion and Shortness of breath when lying flat,   Vascular:  positive for: no symptoms,  negative for: Pain in calf, thigh, or hip brought on by ambulation, Pain in feet at night that wakes you up  from your sleep, Blood clot in your veins and Leg swelling  Pulmonary:  positive for: no symptoms,  negative for: Oxygen at home, Productive cough and Wheezing  Neurologic:  positive for: Sudden onset of difficulty speaking or slurred speech, episode of diploia negative for: Sudden weakness in arms or legs, Sudden numbness in arms or legs, Temporary loss of vision in one eye and Problems with dizziness  Gastrointestinal:  positive for: no symptoms, negative for: Blood in stool and Vomited blood  Genitourinary:  positive for: no symptoms, negative for: Burning when urinating and Blood in urine  Psychiatric:  positive for: no symptoms,  negative for: Major depression  Hematologic:  positive for: no symptoms,  negative for: negative for: Bleeding problems and Problems with blood clotting too easily  Dermatologic:  positive for: no symptoms, negative for: Rashes or ulcers  Constitutional:  positive for: no symptoms, negative for: Fever or chills  Ear/Nose/Throat:  positive for: no symptoms, negative for: Change in hearing, Nose bleeds and Sore throat  Musculoskeletal:  positive for: no symptoms, negative for: Back pain, Joint pain and Muscle pain   For VQI Use Only   PRE-ADM LIVING: Home  AMB STATUS: Ambulatory  CAD Sx: History of MI, but no symptoms No MI within 6 months  PRIOR CHF: None  STRESS TEST: No   Physical Examination   Vitals:   10/05/16 1004 10/05/16 1005  BP: 118/78 123/75  Pulse: 62   Resp: 18   Temp: 97.2 F (36.2 C)   TempSrc: Oral   SpO2: 100%   Weight: 122 lb 3.2 oz (55.4 kg)   Height: 5' 8" (1.727 m)     Body mass index is 18.58 kg/m.  General Alert, O x 3, WD, NAD  Head [place]/AT,    Ear/Nose/Throat Hearing grossly intact, nares without erythema or drainage, oropharynx without Erythema or Exudate, Mallampati score: 3, Dentition intact  Eyes PERRLA, EOMI,    Neck Supple, mid-line trachea,    Pulmonary Sym exp, good B air movt,  CTA B  Cardiac RRR, Nl S1, S2, no Murmurs, No rubs, No S3,S4  Vascular Vessel Right Left  Radial Palpable Palpable  Brachial Palpable Palpable  Carotid Palpable, No Bruit Palpable, No Bruit  Aorta Not palpable N/A  Femoral Palpable Palpable  Popliteal Not palpable Not palpable  PT Palpable Palpable  DP Palpable Palpable    Gastrointestinal soft, non-distended, non-tender to palpation, No guarding or rebound, no HSM, no masses, no CVAT B, No palpable prominent aortic pulse,    Musculoskeletal M/S 5/5 throughout  , Extremities without ischemic changes  , No edema present, No obvious varicosities , No Lipodermatosclerosis present  Neurologic Cranial   nerves 2-12 intact  , Pain and light touch intact in extremities  , Motor exam as listed above  Psychiatric Judgement intact, Mood & affect appropriate for pt's clinical situation  Dermatologic See M/S exam for extremity exam, No rashes otherwise noted  Lymphatic  Palpable lymph nodes: None    Radiology   Outside CTA Neck (09/14/16)  Ulcerative plaque at the left carotid bifurcation resulting in moderate to severe stenosis at the origin of the left ICA, appropriately 66-70%  Plaque within the bilateral cavernous and supraclinoid ICAs.  Apparent moderate to severe stenosis within the right supraclinoid ICA at least moderate stenosis on the left.  Evaluation of this region is somewhat limited secondary to motion artifact.  Plaque at the right carotid bifurcation with no more than 50% stenosis.  Moderate stenosis at the origin of the nondominant left vertebral artery.  Unfortunately the timing on the study is off which compromises the quality of the images.  I feel a second study is necessary   Outside Studies/Documentation   4 pages of outside documents were reviewed including: outside CTA neck report, neurology chart   Medical Decision Making    [name] Sosa is a 61 y.o. male who presents with: possibly sx L ICA stenosis >70%,  multivessel CAD s/p CABG x 5v with 3/5 graft occluded, asx R ICA stenosis ~50%, possible intracranial disease   Based on the patient's vascular studies and examination, I have offered the patient: B carotid and cerebral angiogram.  As noted above, I don't think the CTA neck was timed properly so a second study is needed. I discussed with the patient the nature of angiographic procedures, especially the limited patencies of any endovascular intervention.   The patient is aware of that the risks of an angiographic procedure include but are not limited to: bleeding, infection, access site complications, renal failure, embolization, rupture of vessel, dissection, arteriovenous fistula, possible need for emergent surgical intervention, possible need for surgical procedures to treat the patient's pathology, anaphylactic reaction to contrast, and stroke and death.   The patient is aware of the risks and agrees to proceed. I discussed in depth with the patient the nature of atherosclerosis, and emphasized the importance of maximal medical management including strict control of blood pressure, blood glucose, and lipid levels, obtaining regular exercise, antiplatelet agents, and cessation of smoking.   The patient is currently on a statin: Crestor.  The patient is currently on an anti-platelet: ASA.  The patient is aware that without maximal medical management the underlying atherosclerotic disease process will progress, limiting the benefit of any interventions.  Thank you for allowing us to participate in this patient's care.   Efe Fazzino, MD, FACS [facility] Office: [phone] Pager: [phone]  10/05/2016, 10:13 AM       

## 2016-10-11 NOTE — Op Note (Signed)
OPERATIVE NOTE   PROCEDURE: 1.  Right common femoral artery cannulation under ultrasound guidance 2.  Placement of catheter in aorta 3.  Arch Aortogram 4.  Right common carotid artery selection 5.  Right carotid and cerebral angiogram 6.  Left carotid and cerebral angiogram  PRE-OPERATIVE DIAGNOSIS: Left symptomatic carotid artery disease  POST-OPERATIVE DIAGNOSIS: same as above   SURGEON: Leonides Sake, MD  ANESTHESIA: local anesthesia  ESTIMATED BLOOD LOSS: 50 cc  CONTRAST: 91 cc  FINDING(S):  Type 1 aortic arch: patent  Innominate artery: patent  R common carotid artery: patent  R internal carotid artery: patent, minimal tandem filling defects in proximal internal carotid artery   R external carotid artery: patent, stenosis 50-75%  R subclavian artery: patent  R vertebral artery: patent  Left common carotid artery: patent  L internal carotid artery: patent, complex plaque morphology, 60% stenosis by NASCET criteria, distal to mandibular angle  L external carotid artery: patent, stenosis >75%  Left subclavian artery: patent  Left vertebral artery: patent  Intracranial findings to be read by Neuroradiology  SPECIMEN(S):  none  INDICATIONS:   [name] Sosa is a 61 y.o. male who presents with recurrent episodes of slurring speech.  On outside CTA reportedly his left internal carotid artery has an ulcerated plaque.  The patient presents for: B carotid and cerebral angiography.  I discussed with the patient the nature of angiographic procedures, especially the limited patencies of any endovascular intervention.  The patient is aware of that the risks of an angiographic procedure include but are not limited to: bleeding, infection, access site complications, renal failure, embolization, rupture of vessel, dissection, possible need for emergent surgical intervention, possible need for surgical procedures to treat the patient's pathology, and stroke and death.  The  patient is aware of the risks and agrees to proceed.  DESCRIPTION: After full informed consent was obtained from the patient, the patient was brought back to the angiography suite.  The patient was placed supine upon the angiography table and connected to cardiopulmonary monitoring equipment.  A circulating radiologic technician maintained continuous monitoring of the patient's cardiopulmonary status.  Additionally, the control room radiologic technician provided backup monitoring throughout the procedure.  The patient was prepped and drape in the standard fashion for an angiographic procedure.  At this point, attention was turned to the right groin.  Under ultrasound guidance, the subcutaneous tissue surrounding the right common femoral artery was anesthesized with 1% lidocaine with epinephrine.  The artery was then cannulated with a micropuncture needle.  The microwire was advanced into the iliac arterial system.  The needle was exchanged for a microsheath, which was loaded into the common femoral artery over the wire.  The microwire was exchanged for a Bentson wire which was advanced into the aorta.  The microsheath was then exchanged for a 5-Fr sheath which was loaded into the common femoral artery.  The long pigtail catheter was then loaded over the wire up to the level of ascending aorta.  The catheter was connected to the power injector circuit.  After de-airring and de-clotting the circuit, a power injector arch aortogram was completed at 40 degree LAO.    The catheter was then pulled into the descending thoracic aorta.  The catheter was exchanged for a H1 catheter over the Bentson wire.  Using this combination, I selected the innominate artery.  The right common carotid artery was then selected.  The catheter was advanced into the proximal right common carotid artery.  The wire was  removed and then the catheter de-airred and declotted.  The catheter was connected to the power injector circuit.  A  right cerebral and carotid angiogram was completed in anteroposterior and lateral projections.  The findings are listed above.    The Bentson wire was replaced in the catheter and the catheter and wire pulled back into the aortic arch.  The left common carotid artery was selected with a combination of a BER-2 catheter and Bentson wire.   The catheter was advanced into the proximal left common carotid artery.  The wire was removed and then the catheter de-airred and declotted.  The catheter was connected to the power injector circuit.  A left cerebral and carotid angiogram was completed in anteroposterior and lateral projections.  The findings are listed above.  Based on the images, I think it reasonable to offer the patient a left carotid endarterectomy to treat what may be a symptomatic ulcerated plaque in the left internal carotid artery.   COMPLICATIONS: none  CONDITION: stable   Leonides Sake, MD, [facility] [place] Office: [phone] Pager: [phone]  10/11/2016, 1:21 PM

## 2016-10-11 NOTE — Discharge Instructions (Signed)
Femoral Site Care °Refer to this sheet in the next few weeks. These instructions provide you with information about caring for yourself after your procedure. Your health care provider may also give you more specific instructions. Your treatment has been planned according to current medical practices, but problems sometimes occur. Call your health care provider if you have any problems or questions after your procedure. °What can I expect after the procedure? °After your procedure, it is typical to have the following: °· Bruising at the site that usually fades within 1-2 weeks. °· Blood collecting in the tissue (hematoma) that may be painful to the touch. It should usually decrease in size and tenderness within 1-2 weeks. °Follow these instructions at home: °· Take medicines only as directed by your health care provider. °· You may shower 24-48 hours after the procedure or as directed by your health care provider. Remove the bandage (dressing) and gently wash the site with plain soap and water. Pat the area dry with a clean towel. Do not rub the site, because this may cause bleeding. °· Do not take baths, swim, or use a hot tub until your health care provider approves. °· Check your insertion site every day for redness, swelling, or drainage. °· Do not apply powder or lotion to the site. °· Limit use of stairs to twice a day for the first 2-3 days or as directed by your health care provider. °· Do not squat for the first 2-3 days or as directed by your health care provider. °· Do not lift over 10 lb (4.5 kg) for 5 days after your procedure or as directed by your health care provider. °· Ask your health care provider when it is okay to: °¨ Return to work or school. °¨ Resume usual physical activities or sports. °¨ Resume sexual activity. °· Do not drive home if you are discharged the same day as the procedure. Have someone else drive you. °· You may drive 24 hours after the procedure unless otherwise instructed by  your health care provider. °· Do not operate machinery or power tools for 24 hours after the procedure or as directed by your health care provider. °· If your procedure was done as an outpatient procedure, which means that you went home the same day as your procedure, a responsible adult should be with you for the first 24 hours after you arrive home. °· Keep all follow-up visits as directed by your health care provider. This is important. °Contact a health care provider if: °· You have a fever. °· You have chills. °· You have increased bleeding from the site. Hold pressure on the site. °Get help right away if: °· You have unusual pain at the site. °· You have redness, warmth, or swelling at the site. °· You have drainage (other than a small amount of blood on the dressing) from the site. °· The site is bleeding, and the bleeding does not stop after 30 minutes of holding steady pressure on the site. °· Your leg or foot becomes pale, cool, tingly, or numb. °This information is not intended to replace advice given to you by your health care provider. Make sure you discuss any questions you have with your health care provider. °Document Released: 02/05/2014 Document Revised: 11/10/2015 Document Reviewed: 12/22/2013 °Elsevier Interactive Patient Education © 2017 [employer] ° °

## 2016-10-12 ENCOUNTER — Ambulatory Visit (INDEPENDENT_AMBULATORY_CARE_PROVIDER_SITE_OTHER): Payer: Medicaid Other | Admitting: Cardiovascular Disease

## 2016-10-12 ENCOUNTER — Encounter (HOSPITAL_COMMUNITY): Payer: Self-pay | Admitting: Vascular Surgery

## 2016-10-12 ENCOUNTER — Other Ambulatory Visit: Payer: Self-pay

## 2016-10-12 VITALS — BP 112/62 | HR 73 | Ht 68.0 in | Wt 123.8 lb

## 2016-10-12 DIAGNOSIS — I10 Essential (primary) hypertension: Secondary | ICD-10-CM

## 2016-10-12 DIAGNOSIS — I25709 Atherosclerosis of coronary artery bypass graft(s), unspecified, with unspecified angina pectoris: Secondary | ICD-10-CM | POA: Diagnosis not present

## 2016-10-12 DIAGNOSIS — Z01810 Encounter for preprocedural cardiovascular examination: Secondary | ICD-10-CM

## 2016-10-12 DIAGNOSIS — E7800 Pure hypercholesterolemia, unspecified: Secondary | ICD-10-CM

## 2016-10-12 NOTE — Progress Notes (Signed)
Chief Complaint  Patient presents with  . Follow-up    History of Present Illness: 61 yo [place] Sosa with h/o CAD s/p 7V CABG in 2000 at [facility], HTN, hyperlipidemia here today for follow up. He was seen as a new pt in January 2011 with complaints of chest pain. Cardiac cath 2011 with 5/7 patent bypass grafts with good flow into the Diagonals. He had been followed by Dr. Juliann Pares in [place] prior to 2011. Long history of chest pain.  Stress myoview 01/18/14 with no ischemia, LVEF=48%. Last cath October 2017 with patent LIMA to LAD, SVG to OM which filled all of the Circumflex and occluded RCA with occluded SVG to RCA. The RCA filled from left to right collaterals. He has had recent TIA symptoms and was found by angiography 10/11/16 to have an ulcerated LICA stenosis and planning is underway for left CEA.   He is here today for follow up. The patient denies any chest pain, dyspnea, palpitations, lower extremity edema, orthopnea, PND, dizziness, near syncope or syncope.   Primary Care Physician: Katy Apo, MD   Past Medical History:  Diagnosis Date  . CAD (coronary artery disease)    s/p 7 vessel CABG 2000 at [facility]  . High cholesterol   . Hyperlipidemia   . Hypertension   . 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 4v cabg    . CARDIAC CATHETERIZATION    . CARDIAC CATHETERIZATION N/A 03/23/2016   Procedure: Left Heart Cath and Cors/Grafts Angiography;  Surgeon: Kathleene Hazel, MD;  Location: [facility] INVASIVE CV LAB;  Service: Cardiovascular;  Laterality: N/A;  . CAROTID ANGIOGRAPHY N/A 10/11/2016   Procedure: Carotid Angiography / Cerebral angiogram;  Surgeon: Fransisco Hertz, MD;  Location: [facility] INVASIVE CV LAB;  Service: Cardiovascular;  Laterality: N/A;  . CORONARY ARTERY BYPASS GRAFT    . patent grafts    . s/p 7      Current Outpatient Prescriptions  Medication Sig Dispense Refill  .  aspirin [name] MG chewable tablet Chew [name] mg by mouth daily.    . carvedilol (COREG) 3.125 MG tablet Take 1 tablet (3.125 mg total) by mouth 2 (two) times daily with a meal. 180 tablet 3  . cholecalciferol (VITAMIN D) 1000 units tablet Take 1,000 Units by mouth daily.    Marland Kitchen lisinopril (PRINIVIL,ZESTRIL) 20 MG tablet Take 1 tablet (20 mg total) by mouth daily. 90 tablet 3  . Multiple Vitamins-Minerals (MULTIVITAMIN WITH MINERALS) tablet Take 1 tablet by mouth daily.    . nitroGLYCERIN (NITROSTAT) 0.4 MG SL tablet Place 1 tablet (0.4 mg total) under the tongue every 5 (five) minutes as needed for chest pain (up to 3 doses). 25 tablet 3  . rosuvastatin (CRESTOR) 10 MG tablet Take 1 tablet (10 mg total) by mouth daily. 90 tablet 3   No current facility-administered medications for this visit.     No Known Allergies  Social History   Social History  . Marital status: Single    Spouse name: N/A  . Number of children: 3  . Years of education: AS + 2   Occupational History  . [employer]    Social History Main Topics  . Smoking status: Former Smoker    Quit date: 06/19/1998  . Smokeless tobacco: Never Used  . Alcohol use No  . Drug use: No  . Sexual activity: Yes   Other Topics Concern  . Not on  file   Social History Narrative   Lives at home alone   Right-handed   Caffeine: 2 cups per day    Family History  Problem Relation Age of Onset  . Heart failure Mother   . Hypertension Mother   . Heart failure Father   . Hypertension Father   . Heart disease Father     before age 71  . Hypertension Sister   . Hypertension Brother   . Heart attack Neg Hx   . Stroke Neg Hx     Review of Systems:  As stated in the HPI and otherwise negative.   BP 112/62   Pulse 73   Ht  (1.727 m)   Wt 123 lb 12.8 oz (56.2 kg)   SpO2 98%   BMI 18.82 kg/m   Physical Examination: General: Well developed, well nourished, NAD  HEENT: OP clear, mucus membranes moist  SKIN: warm, dry. No  rashes. Neuro: No focal deficits  Musculoskeletal: Muscle strength 5/5 all ext  Psychiatric: Mood and affect normal  Neck: No JVD, no carotid bruits, no thyromegaly, no lymphadenopathy.  Lungs:Clear bilaterally, no wheezes, rhonci, crackles Cardiovascular: Regular rate and rhythm. No murmurs, gallops or rubs. Abdomen:Soft. Bowel sounds present. Non-tender.  Extremities: No lower extremity edema. Pulses are 2 + in the bilateral DP/PT.  Cardiac cath 03/23/16: 1. Triple vessel CAD s/p 5V CABG with 2/5 patent bypass grafts 2. LAD is occluded mid segment. LIMA graft is patent to the LAD 3. Circumflex is occluded proximally. The free RIMA graft to the first OM is patent and fills the entire Circumflex including all marginal branches.  4. The RCA is occluded proximally. The vein graft to the RCA is now occluded (new since last cath). The distal RCA fills from left to right collaterals.  5. Both SVG grafts to Diagonal 1 and Diagonal 2 are known to be occluded 6. Moderate systolic LV dysfunction, LVEF=35-45%.   EKG:  EKG is not ordered today. The ekg from 10/11/16 shows sinus rhythm with inferior Q waves and poor R wave progression through the precordial leads. No changes.   Recent Labs: 05/03/2016: TSH 0.351 05/18/2016: ALT 22; Platelets 251 10/11/2016: BUN 17; Creatinine, Ser 0.70; Hemoglobin 13.9; Potassium 4.0; Sodium 140   Lipid Panel    Component Value Date/Time   CHOL 117 08/09/2016 1040   TRIG 35 08/09/2016 1040   HDL 58 08/09/2016 1040   CHOLHDL 2.0 08/09/2016 1040   CHOLHDL 3 03/16/2015 1010   VLDL 31.0 03/16/2015 1010   LDLCALC 52 08/09/2016 1040     Wt Readings from Last 3 Encounters:  10/12/16 123 lb 12.8 oz (56.2 kg)  10/11/16 120 lb (54.4 kg)  10/05/16 122 lb 3.2 oz (55.4 kg)     Other studies Reviewed: Additional studies/ records that were reviewed today include: . Review of the above records demonstrates:    Assessment and Plan:   1. CAD s/p CABG with angina: He  has chronic stable angina. Cardiac cath October 2017 with stable CAD. Will continue ASA, beta blocker, ARB, statin and Ranexa.   2. HTN: BP is controlled. No changes today.   3. HLD: LDL at goal. Continue statin.   4. Pre-operative cardiovascular examination: His cardiac disease appears to be stable. He has no chest pain suggestive of angina, no signs of CHF, no arrhythmias. EKG shows no ischemic changes. He is very active. OK to proceed with surgery as planned.   Current medicines are reviewed at length with the patient  today.  The patient does not have concerns regarding medicines.  The following changes have been made:  no change  Labs/ tests ordered today include:   No orders of the defined types were placed in this encounter.   Disposition:   FU with me in 6  months  Signed, Verne Carrow, MD 10/12/2016 3:45 PM    [facility] [address] [place], [place], [place]  [address] Phone: [phone]; Fax: [phone]

## 2016-10-12 NOTE — Patient Instructions (Signed)
Medication Instructions:  Your physician recommends that you continue on your current medications as directed. Please refer to the Current Medication list given to you today.   Labwork: none  Testing/Procedures: none  Follow-Up: Your physician recommends that you schedule a follow-up appointment in: 6 months.  Please call us in about 3 months to schedule this appointment   Any Other Special Instructions Will Be Listed Below (If Applicable).     If you need a refill on your cardiac medications before your next appointment, please call your pharmacy.   

## 2016-10-15 ENCOUNTER — Encounter (HOSPITAL_COMMUNITY): Payer: Self-pay | Admitting: *Deleted

## 2016-10-16 ENCOUNTER — Inpatient Hospital Stay (HOSPITAL_COMMUNITY): Payer: Medicaid Other | Admitting: Certified Registered"

## 2016-10-16 ENCOUNTER — Encounter (HOSPITAL_COMMUNITY)
Admission: RE | Disposition: A | Discharge status: Home / Self Care | Payer: Self-pay | Source: Ambulatory Visit | Attending: Vascular Surgery

## 2016-10-16 ENCOUNTER — Inpatient Hospital Stay (HOSPITAL_COMMUNITY)
Admission: RE | Admit: 2016-10-16 | Discharge: 2016-10-18 | DRG: 038 | Disposition: A | Discharge status: Home / Self Care | Payer: Medicaid Other | Source: Ambulatory Visit | Attending: Vascular Surgery | Admitting: Vascular Surgery

## 2016-10-16 ENCOUNTER — Encounter (HOSPITAL_COMMUNITY): Payer: Self-pay | Admitting: *Deleted

## 2016-10-16 DIAGNOSIS — E785 Hyperlipidemia, unspecified: Secondary | ICD-10-CM | POA: Diagnosis present

## 2016-10-16 DIAGNOSIS — R51 Headache: Secondary | ICD-10-CM | POA: Diagnosis not present

## 2016-10-16 DIAGNOSIS — K5900 Constipation, unspecified: Secondary | ICD-10-CM | POA: Diagnosis not present

## 2016-10-16 DIAGNOSIS — Z951 Presence of aortocoronary bypass graft: Secondary | ICD-10-CM | POA: Diagnosis not present

## 2016-10-16 DIAGNOSIS — G629 Polyneuropathy, unspecified: Secondary | ICD-10-CM | POA: Diagnosis present

## 2016-10-16 DIAGNOSIS — I2510 Atherosclerotic heart disease of native coronary artery without angina pectoris: Secondary | ICD-10-CM | POA: Diagnosis present

## 2016-10-16 DIAGNOSIS — Z681 Body mass index (BMI) 19 or less, adult: Secondary | ICD-10-CM

## 2016-10-16 DIAGNOSIS — I6522 Occlusion and stenosis of left carotid artery: Principal | ICD-10-CM | POA: Diagnosis present

## 2016-10-16 DIAGNOSIS — R109 Unspecified abdominal pain: Secondary | ICD-10-CM

## 2016-10-16 DIAGNOSIS — Z8673 Personal history of transient ischemic attack (TIA), and cerebral infarction without residual deficits: Secondary | ICD-10-CM | POA: Diagnosis not present

## 2016-10-16 DIAGNOSIS — I10 Essential (primary) hypertension: Secondary | ICD-10-CM | POA: Diagnosis present

## 2016-10-16 DIAGNOSIS — R42 Dizziness and giddiness: Secondary | ICD-10-CM | POA: Diagnosis not present

## 2016-10-16 DIAGNOSIS — Z7982 Long term (current) use of aspirin: Secondary | ICD-10-CM

## 2016-10-16 DIAGNOSIS — Z87891 Personal history of nicotine dependence: Secondary | ICD-10-CM

## 2016-10-16 DIAGNOSIS — Z79899 Other long term (current) drug therapy: Secondary | ICD-10-CM | POA: Diagnosis not present

## 2016-10-16 DIAGNOSIS — I6521 Occlusion and stenosis of right carotid artery: Secondary | ICD-10-CM | POA: Diagnosis present

## 2016-10-16 HISTORY — DX: Cerebral infarction, unspecified: I63.9

## 2016-10-16 HISTORY — DX: Prediabetes: R73.03

## 2016-10-16 HISTORY — PX: ENDARTERECTOMY: SHX5162

## 2016-10-16 LAB — COMPREHENSIVE METABOLIC PANEL
ALT: 19 U/L (ref 17–63)
AST: 23 U/L (ref 15–41)
Albumin: 3.9 g/dL (ref 3.5–5.0)
Alkaline Phosphatase: 54 U/L (ref 38–126)
Anion gap: 4 — ABNORMAL LOW (ref 5–15)
BUN: 13 mg/dL (ref 6–20)
CO2: 31 mmol/L (ref 22–32)
Calcium: 9.7 mg/dL (ref 8.9–10.3)
Chloride: 105 mmol/L (ref 101–111)
Creatinine, Ser: 0.82 mg/dL (ref 0.61–1.24)
GFR calc Af Amer: >60 mL/min (ref >60)
GFR calc non Af Amer: >60 mL/min (ref >60)
GLUCOSE: 97 mg/dL (ref 65–99)
POTASSIUM: 5.1 mmol/L (ref 3.5–5.1)
Sodium: 140 mmol/L (ref 135–145)
Total Bilirubin: 0.6 mg/dL (ref 0.3–1.2)
Total Protein: 6.7 g/dL (ref 6.5–8.1)

## 2016-10-16 LAB — URINALYSIS, ROUTINE W REFLEX MICROSCOPIC
Bilirubin Urine: NEGATIVE
GLUCOSE, UA: NEGATIVE mg/dL
Hgb urine dipstick: NEGATIVE
KETONES UR: NEGATIVE mg/dL
LEUKOCYTES UA: NEGATIVE
NITRITE: NEGATIVE
PROTEIN: NEGATIVE mg/dL
Specific Gravity, Urine: 1.013 (ref 1.005–1.030)
pH: 6 (ref 5.0–8.0)

## 2016-10-16 LAB — CREATININE, SERUM
Creatinine, Ser: 0.69 mg/dL (ref 0.61–1.24)
GFR calc Af Amer: >60 mL/min (ref >60)
GFR calc non Af Amer: >60 mL/min (ref >60)

## 2016-10-16 LAB — CBC
HCT: 40.4 % (ref 39.0–52.0)
HEMATOCRIT: 34 % — AB (ref 39.0–52.0)
HEMOGLOBIN: 11.6 g/dL — AB (ref 13.0–17.0)
Hemoglobin: 13.5 g/dL (ref 13.0–17.0)
MCH: 30.9 pg (ref 26.0–34.0)
MCH: 30.9 pg (ref 26.0–34.0)
MCHC: 33.4 g/dL (ref 30.0–36.0)
MCHC: 34.1 g/dL (ref 30.0–36.0)
MCV: 92.4 fL (ref 78.0–100.0)
MCV: 90.7 fL (ref 78.0–100.0)
Platelets: 194 10*3/uL (ref 150–400)
Platelets: 177 10*3/uL (ref 150–400)
RBC: 4.37 MIL/uL (ref 4.22–5.81)
RBC: 3.75 MIL/uL — AB (ref 4.22–5.81)
RDW: 12.6 % (ref 11.5–15.5)
RDW: 12.4 % (ref 11.5–15.5)
WBC: 8.9 10*3/uL (ref 4.0–10.5)
WBC: 9.4 10*3/uL (ref 4.0–10.5)

## 2016-10-16 LAB — TYPE AND SCREEN
ABO/RH(D): A POS
ANTIBODY SCREEN: NEGATIVE

## 2016-10-16 LAB — SURGICAL PCR SCREEN
MRSA, PCR: NEGATIVE
STAPHYLOCOCCUS AUREUS: NEGATIVE

## 2016-10-16 LAB — PROTIME-INR
INR: 0.99
Prothrombin Time: 13.1 seconds (ref 11.4–15.2)

## 2016-10-16 LAB — APTT: aPTT: 30 seconds (ref 24–36)

## 2016-10-16 LAB — ABO/RH: ABO/RH(D): A POS

## 2016-10-16 SURGERY — ENDARTERECTOMY, CAROTID
Anesthesia: General | Site: Neck | Laterality: Left

## 2016-10-16 MED ORDER — OXYCODONE HCL 5 MG/5ML PO SOLN: 5.0000 mg | Freq: Once | ORAL | Status: AC | PRN

## 2016-10-16 MED ORDER — ACETAMINOPHEN 650 MG RE SUPP: 325.0000 mg | RECTAL | Status: DC | PRN

## 2016-10-16 MED ORDER — PHENYLEPHRINE HCL 10 MG/ML IJ SOLN
INTRAMUSCULAR | Status: DC | PRN
  Administered 2016-10-16: 40 ug via INTRAVENOUS

## 2016-10-16 MED ORDER — POTASSIUM CHLORIDE CRYS ER 20 MEQ PO TBCR: 20.0000 meq | EXTENDED_RELEASE_TABLET | Freq: Every day | ORAL | Status: DC | PRN

## 2016-10-16 MED ORDER — SODIUM CHLORIDE 0.9 % IV SOLN
INTRAVENOUS | Status: DC | PRN
  Administered 2016-10-16: 08:00:00

## 2016-10-16 MED ORDER — SUGAMMADEX SODIUM 200 MG/2ML IV SOLN
INTRAVENOUS | Status: DC | PRN
  Administered 2016-10-16: 150 mg via INTRAVENOUS

## 2016-10-16 MED ORDER — METOPROLOL TARTRATE 5 MG/5ML IV SOLN: 2.0000 mg | INTRAVENOUS | Status: DC | PRN

## 2016-10-16 MED ORDER — ACETAMINOPHEN 325 MG PO TABS
325.0000 mg | ORAL_TABLET | ORAL | Status: DC | PRN
  Administered 2016-10-17 (×3): 650 mg via ORAL
  Filled 2016-10-16 (×3): qty 2

## 2016-10-16 MED ORDER — PROTAMINE SULFATE 10 MG/ML IV SOLN
INTRAVENOUS | Status: AC
  Filled 2016-10-16: qty 5

## 2016-10-16 MED ORDER — OXYCODONE HCL 5 MG/5ML PO SOLN: 5.0000 mg | Freq: Once | ORAL | Status: DC | PRN

## 2016-10-16 MED ORDER — HYDRALAZINE HCL 20 MG/ML IJ SOLN: 5.0000 mg | INTRAMUSCULAR | Status: DC | PRN

## 2016-10-16 MED ORDER — CHLORHEXIDINE GLUCONATE CLOTH 2 % EX PADS: 6.0000 | MEDICATED_PAD | Freq: Once | CUTANEOUS | Status: DC

## 2016-10-16 MED ORDER — PANTOPRAZOLE SODIUM 40 MG PO TBEC
40.0000 mg | DELAYED_RELEASE_TABLET | Freq: Every day | ORAL | Status: DC
  Filled 2016-10-16: qty 1

## 2016-10-16 MED ORDER — PROTAMINE SULFATE 10 MG/ML IV SOLN
INTRAVENOUS | Status: DC | PRN
  Administered 2016-10-16 (×4): 10 mg via INTRAVENOUS

## 2016-10-16 MED ORDER — FENTANYL CITRATE (PF) 250 MCG/5ML IJ SOLN
INTRAMUSCULAR | Status: AC
  Filled 2016-10-16: qty 5

## 2016-10-16 MED ORDER — OXYCODONE-ACETAMINOPHEN 5-325 MG PO TABS: 1.0000 | ORAL_TABLET | Freq: Four times a day (QID) | ORAL | 0 refills | Status: DC | PRN

## 2016-10-16 MED ORDER — ALUM & MAG HYDROXIDE-SIMETH 200-200-20 MG/5ML PO SUSP: 15.0000 mL | ORAL | Status: DC | PRN

## 2016-10-16 MED ORDER — MUPIROCIN 2 % EX OINT
1.0000 "application " | TOPICAL_OINTMENT | Freq: Once | CUTANEOUS | Status: AC
  Administered 2016-10-16: 1 via TOPICAL
  Filled 2016-10-16: qty 22

## 2016-10-16 MED ORDER — EPHEDRINE SULFATE-NACL 50-0.9 MG/10ML-% IV SOSY
PREFILLED_SYRINGE | INTRAVENOUS | Status: DC | PRN
  Administered 2016-10-16 (×2): 10 mg via INTRAVENOUS

## 2016-10-16 MED ORDER — PROPOFOL 10 MG/ML IV BOLUS
INTRAVENOUS | Status: AC
  Filled 2016-10-16: qty 20

## 2016-10-16 MED ORDER — GUAIFENESIN-DM 100-10 MG/5ML PO SYRP: 15.0000 mL | ORAL_SOLUTION | ORAL | Status: DC | PRN

## 2016-10-16 MED ORDER — MAGNESIUM SULFATE 2 GM/50ML IV SOLN
2.0000 g | Freq: Every day | INTRAVENOUS | Status: DC | PRN
  Filled 2016-10-16: qty 50

## 2016-10-16 MED ORDER — 0.9 % SODIUM CHLORIDE (POUR BTL) OPTIME
TOPICAL | Status: DC | PRN
  Administered 2016-10-16: 2000 mL

## 2016-10-16 MED ORDER — ROCURONIUM BROMIDE 10 MG/ML (PF) SYRINGE
PREFILLED_SYRINGE | INTRAVENOUS | Status: AC
  Filled 2016-10-16: qty 5

## 2016-10-16 MED ORDER — NITROGLYCERIN 0.4 MG SL SUBL: 0.4000 mg | SUBLINGUAL_TABLET | SUBLINGUAL | Status: DC | PRN

## 2016-10-16 MED ORDER — LIDOCAINE 2% (20 MG/ML) 5 ML SYRINGE
INTRAMUSCULAR | Status: AC
  Filled 2016-10-16: qty 10

## 2016-10-16 MED ORDER — OXYCODONE HCL 5 MG PO TABS: 5.0000 mg | ORAL_TABLET | ORAL | Status: DC | PRN

## 2016-10-16 MED ORDER — MULTI-VITAMIN/MINERALS PO TABS: 1.0000 | ORAL_TABLET | ORAL | Status: DC

## 2016-10-16 MED ORDER — POLYETHYLENE GLYCOL 3350 17 G PO PACK: 17.0000 g | PACK | Freq: Every day | ORAL | Status: DC | PRN

## 2016-10-16 MED ORDER — FENTANYL CITRATE (PF) 250 MCG/5ML IJ SOLN
INTRAMUSCULAR | Status: DC | PRN
  Administered 2016-10-16: 50 ug via INTRAVENOUS

## 2016-10-16 MED ORDER — LABETALOL HCL 5 MG/ML IV SOLN: 10.0000 mg | INTRAVENOUS | Status: DC | PRN

## 2016-10-16 MED ORDER — SODIUM CHLORIDE 0.9 % IV SOLN
INTRAVENOUS | Status: DC
  Administered 2016-10-16: 75 mL/h via INTRAVENOUS

## 2016-10-16 MED ORDER — OXYCODONE HCL 5 MG PO TABS
5.0000 mg | ORAL_TABLET | Freq: Once | ORAL | Status: AC | PRN
  Administered 2016-10-16: 5 mg via ORAL

## 2016-10-16 MED ORDER — LACTATED RINGERS IV SOLN
INTRAVENOUS | Status: DC | PRN
Start: 2016-10-16 — End: 2016-10-16
  Administered 2016-10-16 (×2): via INTRAVENOUS

## 2016-10-16 MED ORDER — CEFUROXIME SODIUM 1.5 G IJ SOLR
1.5000 g | Freq: Two times a day (BID) | INTRAMUSCULAR | Status: AC
  Administered 2016-10-16 – 2016-10-17 (×2): 1.5 g via INTRAVENOUS
  Filled 2016-10-16 (×2): qty 1.5

## 2016-10-16 MED ORDER — SODIUM CHLORIDE 0.9 % IV SOLN
500.0000 mL | Freq: Once | INTRAVENOUS | Status: AC | PRN
  Administered 2016-10-16: 500 mL via INTRAVENOUS

## 2016-10-16 MED ORDER — OXYCODONE HCL 5 MG PO TABS
ORAL_TABLET | ORAL | Status: AC
  Filled 2016-10-16: qty 1

## 2016-10-16 MED ORDER — LISINOPRIL 10 MG PO TABS: 20.0000 mg | ORAL_TABLET | Freq: Every day | ORAL | Status: DC

## 2016-10-16 MED ORDER — PHENYLEPHRINE 40 MCG/ML (10ML) SYRINGE FOR IV PUSH (FOR BLOOD PRESSURE SUPPORT)
PREFILLED_SYRINGE | INTRAVENOUS | Status: AC
  Filled 2016-10-16: qty 10

## 2016-10-16 MED ORDER — SUGAMMADEX SODIUM 200 MG/2ML IV SOLN
INTRAVENOUS | Status: AC
  Filled 2016-10-16: qty 2

## 2016-10-16 MED ORDER — ROSUVASTATIN CALCIUM 10 MG PO TABS
10.0000 mg | ORAL_TABLET | Freq: Every day | ORAL | Status: DC
  Administered 2016-10-17: 10 mg via ORAL
  Filled 2016-10-16: qty 1

## 2016-10-16 MED ORDER — SODIUM CHLORIDE 0.9 % IV SOLN: INTRAVENOUS | Status: DC

## 2016-10-16 MED ORDER — ENOXAPARIN SODIUM 30 MG/0.3ML ~~LOC~~ SOLN
30.0000 mg | SUBCUTANEOUS | Status: DC
  Administered 2016-10-17: 30 mg via SUBCUTANEOUS
  Filled 2016-10-16: qty 0.3

## 2016-10-16 MED ORDER — LIDOCAINE 2% (20 MG/ML) 5 ML SYRINGE
INTRAMUSCULAR | Status: DC | PRN
  Administered 2016-10-16: 40 mg via INTRAVENOUS
  Administered 2016-10-16: 50 mg via INTRAVENOUS

## 2016-10-16 MED ORDER — LIDOCAINE HCL 1 % IJ SOLN
INTRAMUSCULAR | Status: AC
  Filled 2016-10-16: qty 20

## 2016-10-16 MED ORDER — DEXTRAN 40 IN SALINE 10-0.9 % IV SOLN
INTRAVENOUS | Status: AC
  Filled 2016-10-16: qty 500

## 2016-10-16 MED ORDER — ONDANSETRON HCL 4 MG/2ML IJ SOLN: 4.0000 mg | Freq: Four times a day (QID) | INTRAMUSCULAR | Status: DC | PRN

## 2016-10-16 MED ORDER — ASPIRIN 81 MG PO CHEW
81.0000 mg | CHEWABLE_TABLET | Freq: Every day | ORAL | Status: DC
  Administered 2016-10-17 – 2016-10-18 (×2): 81 mg via ORAL
  Filled 2016-10-16 (×2): qty 1

## 2016-10-16 MED ORDER — ADULT MULTIVITAMIN W/MINERALS CH
1.0000 | ORAL_TABLET | ORAL | Status: DC
  Administered 2016-10-17: 1 via ORAL
  Filled 2016-10-16 (×2): qty 1

## 2016-10-16 MED ORDER — PHENYLEPHRINE HCL 10 MG/ML IJ SOLN
INTRAVENOUS | Status: DC | PRN
  Administered 2016-10-16: 10 ug/min via INTRAVENOUS

## 2016-10-16 MED ORDER — SUCCINYLCHOLINE CHLORIDE 200 MG/10ML IV SOSY
PREFILLED_SYRINGE | INTRAVENOUS | Status: AC
  Filled 2016-10-16: qty 10

## 2016-10-16 MED ORDER — DEXTRAN 40 IN SALINE 10-0.9 % IV SOLN
INTRAVENOUS | Status: DC | PRN
  Administered 2016-10-16: 25 mL/h

## 2016-10-16 MED ORDER — FENTANYL CITRATE (PF) 100 MCG/2ML IJ SOLN: 25.0000 ug | INTRAMUSCULAR | Status: DC | PRN

## 2016-10-16 MED ORDER — SODIUM CHLORIDE 0.9 % IV SOLN
0.0125 ug/kg/min | INTRAVENOUS | Status: AC
  Administered 2016-10-16: .3 ug/kg/min via INTRAVENOUS
  Filled 2016-10-16: qty 2000

## 2016-10-16 MED ORDER — ONDANSETRON HCL 4 MG/2ML IJ SOLN
INTRAMUSCULAR | Status: AC
  Filled 2016-10-16: qty 2

## 2016-10-16 MED ORDER — DOCUSATE SODIUM 100 MG PO CAPS
100.0000 mg | ORAL_CAPSULE | Freq: Every day | ORAL | Status: DC
  Administered 2016-10-18: 100 mg via ORAL
  Filled 2016-10-16: qty 1

## 2016-10-16 MED ORDER — OXYCODONE HCL 5 MG PO TABS: 5.0000 mg | ORAL_TABLET | Freq: Once | ORAL | Status: DC | PRN

## 2016-10-16 MED ORDER — EPHEDRINE 5 MG/ML INJ
INTRAVENOUS | Status: AC
  Filled 2016-10-16: qty 10

## 2016-10-16 MED ORDER — VITAMIN D 1000 UNITS PO TABS
1000.0000 [IU] | ORAL_TABLET | Freq: Every day | ORAL | Status: DC
  Administered 2016-10-17: 1000 [IU] via ORAL
  Filled 2016-10-16 (×2): qty 1

## 2016-10-16 MED ORDER — PROPOFOL 10 MG/ML IV BOLUS
INTRAVENOUS | Status: DC | PRN
  Administered 2016-10-16: 30 mg via INTRAVENOUS
  Administered 2016-10-16: 90 mg via INTRAVENOUS

## 2016-10-16 MED ORDER — HEPARIN SODIUM (PORCINE) 1000 UNIT/ML IJ SOLN
INTRAMUSCULAR | Status: DC | PRN
  Administered 2016-10-16: 6000 [IU] via INTRAVENOUS
  Administered 2016-10-16: 2000 [IU] via INTRAVENOUS

## 2016-10-16 MED ORDER — BISACODYL 10 MG RE SUPP: 10.0000 mg | Freq: Every day | RECTAL | Status: DC | PRN

## 2016-10-16 MED ORDER — DEXTROSE 5 % IV SOLN
1.5000 g | INTRAVENOUS | Status: AC
  Administered 2016-10-16: 1.5 g via INTRAVENOUS
  Filled 2016-10-16: qty 1.5

## 2016-10-16 MED ORDER — GLYCOPYRROLATE 0.2 MG/ML IV SOSY
PREFILLED_SYRINGE | INTRAVENOUS | Status: DC | PRN
  Administered 2016-10-16 (×2): .1 mg via INTRAVENOUS

## 2016-10-16 MED ORDER — MORPHINE SULFATE (PF) 4 MG/ML IV SOLN: 2.0000 mg | INTRAVENOUS | Status: DC | PRN

## 2016-10-16 MED ORDER — HEMOSTATIC AGENTS (NO CHARGE) OPTIME
TOPICAL | Status: DC | PRN
  Administered 2016-10-16: 1 via TOPICAL

## 2016-10-16 MED ORDER — PHENOL 1.4 % MT LIQD: 1.0000 | OROMUCOSAL | Status: DC | PRN

## 2016-10-16 MED ORDER — ONDANSETRON HCL 4 MG/2ML IJ SOLN
INTRAMUSCULAR | Status: DC | PRN
  Administered 2016-10-16: 4 mg via INTRAVENOUS

## 2016-10-16 MED ORDER — ROCURONIUM BROMIDE 10 MG/ML (PF) SYRINGE
PREFILLED_SYRINGE | INTRAVENOUS | Status: DC | PRN
  Administered 2016-10-16: 50 mg via INTRAVENOUS

## 2016-10-16 MED ORDER — CARVEDILOL 3.125 MG PO TABS: 3.1250 mg | ORAL_TABLET | Freq: Two times a day (BID) | ORAL | Status: DC

## 2016-10-16 SURGICAL SUPPLY — 63 items
BAG DECANTER FOR FLEXI CONT (MISCELLANEOUS) ×3 IMPLANT
CANISTER SUCT 3000ML PPV (MISCELLANEOUS) ×3 IMPLANT
CATH ROBINSON RED A/P 18FR (CATHETERS) ×3 IMPLANT
CLIP TI MEDIUM 24 (CLIP) ×3 IMPLANT
CLIP TI WIDE RED SMALL 24 (CLIP) ×3 IMPLANT
COVER PROBE W GEL 5X96 (DRAPES) ×3 IMPLANT
CRADLE DONUT ADULT HEAD (MISCELLANEOUS) ×3 IMPLANT
DERMABOND ADVANCED (GAUZE/BANDAGES/DRESSINGS) ×2
DERMABOND ADVANCED .7 DNX12 (GAUZE/BANDAGES/DRESSINGS) ×1 IMPLANT
DRSG TEGADERM 2-3/8X2-3/4 SM (GAUZE/BANDAGES/DRESSINGS) ×6 IMPLANT
ELECT REM PT RETURN 9FT ADLT (ELECTROSURGICAL) ×3
ELECTRODE REM PT RTRN 9FT ADLT (ELECTROSURGICAL) ×1 IMPLANT
GAUZE SPONGE 2X2 8PLY NS (GAUZE/BANDAGES/DRESSINGS) ×3 IMPLANT
GAUZE SPONGE 2X2 8PLY STRL LF (GAUZE/BANDAGES/DRESSINGS) ×1 IMPLANT
GLOVE BIO SURGEON STRL SZ 6.5 (GLOVE) ×10 IMPLANT
GLOVE BIO SURGEON STRL SZ7 (GLOVE) ×3 IMPLANT
GLOVE BIO SURGEON STRL SZ7.5 (GLOVE) ×3 IMPLANT
GLOVE BIO SURGEONS STRL SZ 6.5 (GLOVE) ×5
GLOVE BIOGEL PI IND STRL 6.5 (GLOVE) ×2 IMPLANT
GLOVE BIOGEL PI IND STRL 7.0 (GLOVE) ×1 IMPLANT
GLOVE BIOGEL PI IND STRL 7.5 (GLOVE) ×1 IMPLANT
GLOVE BIOGEL PI IND STRL 8 (GLOVE) ×1 IMPLANT
GLOVE BIOGEL PI INDICATOR 6.5 (GLOVE) ×4
GLOVE BIOGEL PI INDICATOR 7.0 (GLOVE) ×2
GLOVE BIOGEL PI INDICATOR 7.5 (GLOVE) ×2
GLOVE BIOGEL PI INDICATOR 8 (GLOVE) ×2
GLOVE SURG SS PI 8.0 STRL IVOR (GLOVE) ×3 IMPLANT
GOWN STRL REUS W/ TWL LRG LVL3 (GOWN DISPOSABLE) ×6 IMPLANT
GOWN STRL REUS W/ TWL XL LVL3 (GOWN DISPOSABLE) ×2 IMPLANT
GOWN STRL REUS W/TWL 2XL LVL3 (GOWN DISPOSABLE) IMPLANT
GOWN STRL REUS W/TWL LRG LVL3 (GOWN DISPOSABLE) ×12
GOWN STRL REUS W/TWL XL LVL3 (GOWN DISPOSABLE) ×4
HEMOSTAT SPONGE AVITENE ULTRA (HEMOSTASIS) IMPLANT
IV ADAPTER SYR DOUBLE MALE LL (MISCELLANEOUS) ×3 IMPLANT
KIT BASIN OR (CUSTOM PROCEDURE TRAY) ×3 IMPLANT
KIT ROOM TURNOVER OR (KITS) ×3 IMPLANT
KIT SHUNT ARGYLE CAROTID ART 6 (VASCULAR PRODUCTS) ×3 IMPLANT
NEEDLE HYPO 25GX1X1/2 BEV (NEEDLE) IMPLANT
NS IRRIG 1000ML POUR BTL (IV SOLUTION) ×9 IMPLANT
PACK CAROTID (CUSTOM PROCEDURE TRAY) ×3 IMPLANT
PAD ARMBOARD 7.5X6 YLW CONV (MISCELLANEOUS) ×6 IMPLANT
PATCH VASC XENOSURE 1CMX6CM (Vascular Products) ×2 IMPLANT
PATCH VASC XENOSURE 1X6 (Vascular Products) ×1 IMPLANT
SET COLLECT BLD 21X3/4 12 PB (MISCELLANEOUS) IMPLANT
SHUNT CAROTID BYPASS 10 (VASCULAR PRODUCTS) IMPLANT
SHUNT CAROTID BYPASS 12FRX15.5 (VASCULAR PRODUCTS) IMPLANT
SPONGE GAUZE 2X2 STER 10/PKG (GAUZE/BANDAGES/DRESSINGS) ×2
STOPCOCK 4 WAY LG BORE MALE ST (IV SETS) ×3 IMPLANT
SUT ETHILON 3 0 PS 1 (SUTURE) ×3 IMPLANT
SUT MNCRL AB 4-0 PS2 18 (SUTURE) ×3 IMPLANT
SUT PROLENE 6 0 BV (SUTURE) ×12 IMPLANT
SUT PROLENE 6 0 C 1 24 (SUTURE) IMPLANT
SUT PROLENE 7 0 BV 1 (SUTURE) IMPLANT
SUT SILK 3 0 TIES 17X18 (SUTURE)
SUT SILK 3-0 18XBRD TIE BLK (SUTURE) IMPLANT
SUT VIC AB 3-0 SH 27 (SUTURE) ×2
SUT VIC AB 3-0 SH 27X BRD (SUTURE) ×1 IMPLANT
SYR 20CC LL (SYRINGE) ×3 IMPLANT
SYR TB 1ML LUER SLIP (SYRINGE) IMPLANT
SYSTEM CHEST DRAIN TLS 7FR (DRAIN) ×3 IMPLANT
TUBING ART PRESS 48 MALE/FEM (TUBING) IMPLANT
TUBING EXTENTION W/L.L. (IV SETS) IMPLANT
WATER STERILE IRR 1000ML POUR (IV SOLUTION) ×3 IMPLANT

## 2016-10-16 NOTE — Progress Notes (Signed)
Pt c/o "double vision" when he looks across the room at the clock. PEARL bilat, mod equal strength x 4, mentating clearly, tongue midline,face symmetrical. Dr Imogene Burn updated in OR thru OR RN-said pt has double vision. After a few minutes,pt says vision is nl. Will cont to monitor.

## 2016-10-16 NOTE — Progress Notes (Signed)
  Day of Surgery Note    Subjective:  c/o sore throat  Vitals:   10/16/16 1245 10/16/16 1330  BP: (!) 97/50 (!) 103/50  Pulse: (!) 50 (!) 50  Resp: 17 19  Temp:  97.2 F (36.2 C)    Incisions:   Clean and dry without hematoma Extremities:  Moving all extremities equally Cardiac:  regular Lungs:  Non labored Neuro:  In tact; tongue is midline  Assessment/Plan:  This is a 62 y.o. male who is s/p left carotid endarterectomy  -pt doing well in pacu--he did have double vision earlier, but this has resolved and has had this in the past per Dr. Imogene Burn -neuro in tact; tongue is midline.  Pt with sore throat, which is not unexpected.  -pt did require a 1L fluid bolus for blood pressure, which has improved -pt c/o having to urinate-bladder scan revealed >700cc and I&O cath was performed -transfer to 4 east when bed available    Doreatha Massed, PA-C 10/16/2016 1:38 PM [phone]

## 2016-10-16 NOTE — Anesthesia Procedure Notes (Signed)
Arterial Line Insertion Start/End5/06/2016 7:24 AM, 10/16/2016 7:29 AM Performed by: Val Eagle, anesthesiologist  Patient location: Pre-op. Preanesthetic checklist: patient identified, IV checked, site marked, risks and benefits discussed, surgical consent, monitors and equipment checked, pre-op evaluation, timeout performed and anesthesia consent Lidocaine 1% used for infiltration Right, radial was placed Catheter size: 20 G Hand hygiene performed  and maximum sterile barriers used   Attempts: 1 Procedure performed without using ultrasound guided technique. Following insertion, dressing applied and Biopatch. Post procedure assessment: normal and unchanged  Patient tolerated the procedure well with no immediate complications.

## 2016-10-16 NOTE — H&P (View-Only) (Signed)
New Carotid Patient  Referred by:  Renford Dills, MD [address] [place], [place] [address]   Reason for referral: Recurrent TIA   History of Present Illness   [name] Sosa is a 61 y.o. (21-Jun-1955) male who presents with chief complaint: recurrent TIA.  Previous carotid studies demonstrated: RICA <50% stenosis, LICA >70% stenosis.  Patient has history of TIA symptoms.  The patient initially had diplopia for about 4 months which resolved spontaneously.  He then had episodes of slurred speech.  This was short lived and resolved.  This has recurred multiple times with the most recent in the last week.  The patient has never had amaurosis fugax or monocular blindness.  The patient has never had facial drooping or hemiplegia.  The patient has never had receptive aphasia.   The patient's previous neurologic deficits have resolved.  The patient's risks factors for carotid disease include: HLD, HTN, and prior smoking.  Reviewing the neurologist notes, the patient was previously diagnosed with a Trochlear neuropathy which has resolved at this point.  The patient also had reported facial drooping with these TIA episodes.  Outside CTA reported demonstrated some ulceration in the L carotid plaque.  Past Medical History:  Diagnosis Date  . CAD (coronary artery disease)    s/p 7 vessel CABG 2000 at [facility]  . High cholesterol   . Hyperlipidemia   . Hypertension   . 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 4v cabg    . CARDIAC CATHETERIZATION    . CARDIAC CATHETERIZATION N/A 03/23/2016   Procedure: Left Heart Cath and Cors/Grafts Angiography;  Surgeon: Kathleene Hazel, MD;  Location: [facility] INVASIVE CV LAB;  Service: Cardiovascular;  Laterality: N/A;  . CORONARY ARTERY BYPASS GRAFT    . patent grafts    . s/p 7      Social History   Social History  . Marital status: Single    Spouse name: N/A  . Number of children: 3   . Years of education: AS + 2   Occupational History  . [employer]    Social History Main Topics  . Smoking status: Former Smoker    Quit date: 06/19/1998  . Smokeless tobacco: Never Used  . Alcohol use No  . Drug use: No  . Sexual activity: Yes   Other Topics Concern  . Not on file   Social History Narrative   Lives at home alone   Right-handed   Caffeine: 2 cups per day    Family History  Problem Relation Age of Onset  . Heart failure Mother   . Hypertension Mother   . Heart failure Father   . Hypertension Father   . Heart disease Father     before age 38  . Hypertension Sister   . Hypertension Brother   . Heart attack Neg Hx   . Stroke Neg Hx     Current Outpatient Prescriptions  Medication Sig Dispense Refill  . aspirin 81 MG chewable tablet Chew 81 mg by mouth daily.    . carvedilol (COREG) 3.125 MG tablet Take 1 tablet (3.125 mg total) by mouth 2 (two) times daily with a meal. 180 tablet 3  . lisinopril (PRINIVIL,ZESTRIL) 20 MG tablet Take 1 tablet (20 mg total) by mouth daily. 90 tablet 3  . rosuvastatin (CRESTOR) 10 MG tablet Take 1 tablet (10 mg total) by mouth daily. 90 tablet 3  . nitroGLYCERIN (NITROSTAT) 0.4 MG  SL tablet Place 1 tablet (0.4 mg total) under the tongue every 5 (five) minutes as needed for chest pain (up to 3 doses). (Patient not taking: Reported on 08/09/2016) 25 tablet 3  . ranolazine (RANEXA) 500 MG 12 hr tablet Take 1 tablet (500 mg total) by mouth 2 (two) times daily. (Patient not taking: Reported on 05/18/2016) 60 tablet 6   No current facility-administered medications for this visit.     No Known Allergies   REVIEW OF SYSTEMS:   Cardiac:  positive for: no symptoms, negative for: Chest pain or chest pressure, Shortness of breath upon exertion and Shortness of breath when lying flat,   Vascular:  positive for: no symptoms,  negative for: Pain in calf, thigh, or hip brought on by ambulation, Pain in feet at night that wakes you up  from your sleep, Blood clot in your veins and Leg swelling  Pulmonary:  positive for: no symptoms,  negative for: Oxygen at home, Productive cough and Wheezing  Neurologic:  positive for: Sudden onset of difficulty speaking or slurred speech, episode of diploia negative for: Sudden weakness in arms or legs, Sudden numbness in arms or legs, Temporary loss of vision in one eye and Problems with dizziness  Gastrointestinal:  positive for: no symptoms, negative for: Blood in stool and Vomited blood  Genitourinary:  positive for: no symptoms, negative for: Burning when urinating and Blood in urine  Psychiatric:  positive for: no symptoms,  negative for: Major depression  Hematologic:  positive for: no symptoms,  negative for: negative for: Bleeding problems and Problems with blood clotting too easily  Dermatologic:  positive for: no symptoms, negative for: Rashes or ulcers  Constitutional:  positive for: no symptoms, negative for: Fever or chills  Ear/Nose/Throat:  positive for: no symptoms, negative for: Change in hearing, Nose bleeds and Sore throat  Musculoskeletal:  positive for: no symptoms, negative for: Back pain, Joint pain and Muscle pain   For VQI Use Only   PRE-ADM LIVING: Home  AMB STATUS: Ambulatory  CAD Sx: History of MI, but no symptoms No MI within 6 months  PRIOR CHF: None  STRESS TEST: No   Physical Examination   Vitals:   10/05/16 1004 10/05/16 1005  BP: 118/78 123/75  Pulse: 62   Resp: 18   Temp: 97.2 F (36.2 C)   TempSrc: Oral   SpO2: 100%   Weight: 122 lb 3.2 oz (55.4 kg)   Height:  (1.727 m)     Body mass index is 18.58 kg/m.  General Alert, O x 3, WD, NAD  Head [place]/AT,    Ear/Nose/Throat Hearing grossly intact, nares without erythema or drainage, oropharynx without Erythema or Exudate, Mallampati score: 3, Dentition intact  Eyes PERRLA, EOMI,    Neck Supple, mid-line trachea,    Pulmonary Sym exp, good B air movt,  CTA B  Cardiac RRR, Nl S1, S2, no Murmurs, No rubs, No S3,S4  Vascular Vessel Right Left  Radial Palpable Palpable  Brachial Palpable Palpable  Carotid Palpable, No Bruit Palpable, No Bruit  Aorta Not palpable N/A  Femoral Palpable Palpable  Popliteal Not palpable Not palpable  PT Palpable Palpable  DP Palpable Palpable    Gastrointestinal soft, non-distended, non-tender to palpation, No guarding or rebound, no HSM, no masses, no CVAT B, No palpable prominent aortic pulse,    Musculoskeletal M/S 5/5 throughout  , Extremities without ischemic changes  , No edema present, No obvious varicosities , No Lipodermatosclerosis present  Neurologic Cranial  nerves 2-12 intact  , Pain and light touch intact in extremities  , Motor exam as listed above  Psychiatric Judgement intact, Mood & affect appropriate for pt's clinical situation  Dermatologic See M/S exam for extremity exam, No rashes otherwise noted  Lymphatic  Palpable lymph nodes: None    Radiology   Outside CTA Neck (09/14/16)  Ulcerative plaque at the left carotid bifurcation resulting in moderate to severe stenosis at the origin of the left ICA, appropriately 66-70%  Plaque within the bilateral cavernous and supraclinoid ICAs.  Apparent moderate to severe stenosis within the right supraclinoid ICA at least moderate stenosis on the left.  Evaluation of this region is somewhat limited secondary to motion artifact.  Plaque at the right carotid bifurcation with no more than 50% stenosis.  Moderate stenosis at the origin of the nondominant left vertebral artery.  Unfortunately the timing on the study is off which compromises the quality of the images.  I feel a second study is necessary   Outside Studies/Documentation   4 pages of outside documents were reviewed including: outside CTA neck report, neurology chart   Medical Decision Making    [name] Sosa is a 61 y.o. male who presents with: possibly sx L ICA stenosis >70%,  multivessel CAD s/p CABG x 5v with 3/5 graft occluded, asx R ICA stenosis ~50%, possible intracranial disease   Based on the patient's vascular studies and examination, I have offered the patient: B carotid and cerebral angiogram.  As noted above, I don't think the CTA neck was timed properly so a second study is needed. I discussed with the patient the nature of angiographic procedures, especially the limited patencies of any endovascular intervention.   The patient is aware of that the risks of an angiographic procedure include but are not limited to: bleeding, infection, access site complications, renal failure, embolization, rupture of vessel, dissection, arteriovenous fistula, possible need for emergent surgical intervention, possible need for surgical procedures to treat the patient's pathology, anaphylactic reaction to contrast, and stroke and death.   The patient is aware of the risks and agrees to proceed. I discussed in depth with the patient the nature of atherosclerosis, and emphasized the importance of maximal medical management including strict control of blood pressure, blood glucose, and lipid levels, obtaining regular exercise, antiplatelet agents, and cessation of smoking.   The patient is currently on a statin: Crestor.  The patient is currently on an anti-platelet: ASA.  The patient is aware that without maximal medical management the underlying atherosclerotic disease process will progress, limiting the benefit of any interventions.  Thank you for allowing [place] to participate in this patient's care.   Leonides Sake, MD, FACS [facility] [place] Office: [phone] Pager: [phone]  10/05/2016, 10:13 AM

## 2016-10-16 NOTE — Op Note (Addendum)
OPERATIVE NOTE   PROCEDURE:   1.  left carotid endarterectomy with bovine patch angioplasty 2.  left intraoperative carotid ultrasound  PRE-OPERATIVE DIAGNOSIS: left symptomatic carotid stenosis >70%  POST-OPERATIVE DIAGNOSIS: same as above   SURGEON: Leonides Sake, MD  ASSISTANT(S): Lemar Livings, MD; Doreatha Massed, [facility]   ANESTHESIA: general  ESTIMATED BLOOD LOSS: 50 cc  FINDING(S): 1.  Continuous Doppler audible flow signatures are appropriate for each carotid artery. 2.  No evidence of intimal flap visualized on transverse or longitudinal ultrasonography. 3.  Internal carotid plaque: necrotic core, unroofed plaque 4.  Common carotid artery plaque: calcified, <30% 5.  Vagus nerve: normal position 6.  Hypoglossal nerve: visualized, did not require mobilization  SPECIMEN(S):  Carotid plaque (sent to Pathology)  INDICATIONS:   [name] Sosa is a 61 y.o. male who presents with left symptomatic carotid stenosis >70%.  I discussed with the patient the risks, benefits, and alternatives to carotid endarterectomy.  The patient is NOT a candidate for carotid artery stenting. I discussed the procedural details of carotid endarterectomy with the patient.  The patient is aware that the risks of carotid endarterectomy include but are not limited to: bleeding, infection, stroke, myocardial infarction, death, cranial nerve injuries both temporary and permanent, neck hematoma, possible airway compromise, labile blood pressure post-operatively, cerebral hyperperfusion syndrome, and possible need for additional interventions in the future. The patient is aware of the risks and agrees to proceed forward with the procedure.   DESCRIPTION: After full informed written consent was obtained from the patient, the patient was brought back to the operating room and placed supine upon the operating table.  Prior to induction, the patient received IV antibiotics.  After obtaining adequate anesthesia, the  patient was placed into semi-Fowler position with a shoulder roll in place and the patient's neck slightly hyperextended and rotated away from the surgical site.    The patient was prepped in the standard fashion for a left carotid endarterectomy.  I made an incision anterior to the sternocleidomastoid muscle and dissected down through the subcutaneous tissue.  The platysmas was opened with electrocautery.  Then I dissected down to the internal jugular vein.  This was dissected posteriorly until I obtained visualization of the common carotid artery.  This was dissected out and then an umbilical tape was placed around the common carotid artery and I loosely applied a Rumel tourniquet.  I then dissected in a periadventitial fashion along the common carotid artery up to the bifurcation.  I then identified the external carotid artery and the superior thyroid artery.  A 2-0 silk tie was looped around the superior thyroid artery, and I also dissected out the external carotid artery and placed a vessel loop around it.  In continuing the dissection to the internal carotid artery, I identified the facial vein.  This was ligated and then transected, giving me improved exposure of the internal carotid artery.  In the process of this dissection, the hypoglossal nerve was identified.  I then dissected out the internal carotid artery until I identified an area of soft tissue in the internal carotid artery.  I dissected slightly distal to this area, and placed an umbilical tape around the artery and loosely applied a Rumel tourniquet.    At this point, we gave the patient a therapeutic bolus of Heparin intravenously, 6000 units (roughly 100 units/kg).  In total, 8000 units of Heparin was given.  After waiting 3 minutes, then I clamped the internal carotid artery, external carotid artery and  then the common carotid artery.  I then made an arteriotomy in the common carotid artery with a 11 blade, and extended the arteriotomy  with a Potts scissor down into the common carotid artery, then I carried the arteriotomy through the bifurcation into the internal carotid artery until I reached an area that was not diseased.  At this point, I took the 10 shunt that previously been prepared and I inserted it into the internal carotid artery.  The Rumel tourniquet was then applied to this end of the shunt.  I unclamped the shunt to verify retrograde blood flow in the internal carotid artery.  I then placed the other end of the shunt into the common carotid artery after unclamping the artery.  The Rumel was tightened down around the shunt.  At this point, I verified blood flow in the shunt with a continuous doppler.  At this point, I started the endarterectomy in the common carotid artery with a Cytogeneticist and carried this dissection down into the common carotid artery circumferentially.  Then I transected the plaque at a segment where it was adherent.  I then carried this dissection up into the external carotid artery.  The plaque was extracted by unclamping the external carotid artery and everting the artery.  The dissection was then carried into the internal carotid artery, extracting the remaining portion of the carotid plaque.  I passed the plaque off the field as a specimen.    I then spent the next 30 minutes removing intimal flaps and loose debris.  Eventually I reached the point where the residual plaque was densely adherent and any further dissection would compromise the integrity of the wall.  After verifying that there was no more loose intimal flaps or debris, I re-interrogated the entirety of this carotid artery.  At this point, I was satisfied that the minimal remaining disease was densely adherent to the wall and wall integrity was intact.  At this point, I then fashioned a bovine pericardial patch for the geometry of this artery and sewed it in place with two running stitch of 6-0 Prolene, one from each end.  Prior to  completing this patch angioplasty, I removed the shunt first from the internal carotid artery, from which there was excellent backbleeding, and clamped it.  Then I removed the shunt from the common carotid artery, from which there was excellent antegrade bleeding, and then clamped it.  At this point, I allowed the external carotid artery to backbleed, which was excellent.  Then I instilled heparinized saline in this patched artery and then completed the patch angioplasty in the usual fashion.    At this point, I first released the clamp on the external carotid artery, then I released it on the common carotid artery.  After waiting a few seconds, I then released it on the internal carotid artery.  I then interrogated this patient's arteries with the continuous Doppler.  The audible waveforms in each artery were consistent with the expected characteristics for each artery.  The Sonosite probe was then sterilely draped and used to interrogate the carotid artery in both longitudinal and transverse views.  No intimal flap or residual disease was visualized.   At this point, I washed out the wound, and placed Avitene throughout.  I also gave the patient 40 mg of protamine to reverse his anticoagulation.   After waiting a few minutes, I removed the Avitene and washed out the wound.  There was no more active bleeding in the surgical  site.     I then reapproximated the platysma muscle with a running stitch of 3-0 Vicryl.  The skin was then reapproximated with a running subcuticular 4-0 Monocryl stitch.  The skin was then cleaned, dried and Dermabond was used to reinforce the skin closure.    The patient woke without any problems, neurologically intact.    COMPLICATIONS: none  CONDITION: stable   Leonides Sake, MD, [facility] [place] Office: [phone] Pager: [phone]  10/16/2016, 10:13 AM

## 2016-10-16 NOTE — Progress Notes (Signed)
Pt c/o "I need to pee-I'm getting agitated."  Bladder scan shows 999 cc. Order for in & out cath per Dr. Maple Hudson. This was done without difficulty for 700cc clear yellow uo. Pt tol procedure well and "feels much better".

## 2016-10-16 NOTE — Interval H&P Note (Signed)
   History and Physical Update  The patient was interviewed and re-examined.  The patient's previous History and Physical has been reviewed and is unchanged from my consult except for: interval carotid angiogram.  This demonstrated ulcerated plaque in L ICA.  I have offered the patient a L CEA.   I discussed with the patient the risks, benefits, and alternatives to carotid endarterectomy.    The patient is not a candidate for carotid artery stenting. I discussed the procedural details of carotid endarterectomy with the patient.    The patient is aware that the risks of carotid endarterectomy include but are not limited to: bleeding, infection, stroke, myocardial infarction, death, cranial nerve injuries both temporary and permanent, neck hematoma, possible airway compromise, labile blood pressure post-operatively, cerebral hyperperfusion syndrome, and possible need for additional interventions in the future.   The patient is aware of the risks and agrees to proceed forward with the procedure.   Leonides Sake, MD, FACS [facility] [place] Office: [phone] Pager: [phone]  10/16/2016, 6:55 AM

## 2016-10-16 NOTE — Transfer of Care (Signed)
Immediate Anesthesia Transfer of Care Note  Patient: [name] Sosa  Procedure(s) Performed: Procedure(s): LEFT CAROTID ENDARTERECTOMY WITH PATCH ANGIOPLASTY (Left)  Patient Location: PACU  Anesthesia Type:General  Level of Consciousness: awake, alert , oriented and patient cooperative  Airway & Oxygen Therapy: Patient Spontanous Breathing and Patient connected to nasal cannula oxygen  Post-op Assessment: Report given to RN, Post -op Vital signs reviewed and stable and Patient moving all extremities  Post vital signs: Reviewed and stable  Last Vitals:  Vitals:   10/16/16 0609  BP: 124/65  Pulse: (!) 54  Resp: 18  Temp: 36.4 C    Last Pain:  Vitals:   10/16/16 0609  TempSrc: Oral         Complications: No apparent anesthesia complications

## 2016-10-16 NOTE — Anesthesia Preprocedure Evaluation (Addendum)
Anesthesia Evaluation  Patient identified by MRN, date of birth, ID band Patient awake    Reviewed: Allergy & Precautions, NPO status , Patient's Chart, lab work & pertinent test results, reviewed documented beta blocker date and time   History of Anesthesia Complications Negative for: history of anesthetic complications  Airway Mallampati: II  TM Distance: >3 FB Neck ROM: Full    Dental  (+) Teeth Intact   Pulmonary former smoker,    breath sounds clear to auscultation       Cardiovascular hypertension, Pt. on medications and Pt. on home beta blockers + angina + CAD, + CABG and + Peripheral Vascular Disease   Rhythm:Regular     Neuro/Psych  Neuromuscular disease CVA negative psych ROS   GI/Hepatic negative GI ROS, Neg liver ROS,   Endo/Other  negative endocrine ROS  Renal/GU negative Renal ROS     Musculoskeletal   Abdominal   Peds  Hematology negative hematology ROS (+)   Anesthesia Other Findings   Reproductive/Obstetrics                             Anesthesia Physical Anesthesia Plan  ASA: III  Anesthesia Plan: General   Post-op Pain Management:    Induction: Intravenous  Airway Management Planned: Oral ETT  Additional Equipment: Arterial line  Intra-op Plan:   Post-operative Plan: Extubation in OR  Informed Consent: I have reviewed the patients History and Physical, chart, labs and discussed the procedure including the risks, benefits and alternatives for the proposed anesthesia with the patient or authorized representative who has indicated his/her understanding and acceptance.   Dental advisory given  Plan Discussed with: Surgeon and CRNA  Anesthesia Plan Comments:         Anesthesia Quick Evaluation

## 2016-10-16 NOTE — Anesthesia Procedure Notes (Signed)
Procedure Name: Intubation Date/Time: 10/16/2016 7:49 AM Performed by: Myna Bright Pre-anesthesia Checklist: Patient identified, Emergency Drugs available, Suction available and Patient being monitored Patient Re-evaluated:Patient Re-evaluated prior to inductionOxygen Delivery Method: Circle system utilized Preoxygenation: Pre-oxygenation with 100% oxygen Intubation Type: IV induction Ventilation: Mask ventilation without difficulty Laryngoscope Size: Mac and 4 Grade View: Grade I Tube type: Oral Tube size: 7.5 mm Number of attempts: 1 Airway Equipment and Method: Stylet and LTA kit utilized Placement Confirmation: ETT inserted through vocal cords under direct vision,  positive ETCO2 and breath sounds checked- equal and bilateral Secured at: 22 cm Tube secured with: Tape Dental Injury: Teeth and Oropharynx as per pre-operative assessment

## 2016-10-16 NOTE — Care Management Note (Addendum)
Case Management Note  Patient Details  Name: [name] Sosa MRN: [id number] Date of Birth: February 10, 1956  Subjective/Objective:   From home, s/p left carotid endarterectomy.  He states he has been going to Dr. Delfina Redwood, but would like to go to [facility] clinic for hospital follow up.  He will need to call on Monday morning at 8:30 to schedule follow up with Zettie Pho. He has met with the financial counselor and she is helping him to apply for medicaid. He will have transport at discharge.               Action/Plan: PTA indep, NCM will cont to follow for dc needs.  Expected Discharge Date:                  Expected Discharge Plan:  Home/Self Care  In-House Referral:     Discharge planning Services  CM Consult  Post Acute Care Choice:    Choice offered to:     DME Arranged:    DME Agency:     HH Arranged:    HH Agency:     Status of Service:  In process, will continue to follow  If discussed at Long Length of Stay Meetings, dates discussed:    Additional Comments:  Zenon Mayo, RN 10/16/2016, 5:12 PM

## 2016-10-17 ENCOUNTER — Inpatient Hospital Stay (HOSPITAL_COMMUNITY): Payer: Medicaid Other

## 2016-10-17 ENCOUNTER — Encounter (HOSPITAL_COMMUNITY): Payer: Self-pay | Admitting: Vascular Surgery

## 2016-10-17 LAB — BASIC METABOLIC PANEL
Anion gap: 7 (ref 5–15)
BUN: 10 mg/dL (ref 6–20)
CHLORIDE: 104 mmol/L (ref 101–111)
CO2: 25 mmol/L (ref 22–32)
Calcium: 8.5 mg/dL — ABNORMAL LOW (ref 8.9–10.3)
Creatinine, Ser: 0.79 mg/dL (ref 0.61–1.24)
GFR calc Af Amer: >60 mL/min (ref >60)
GFR calc non Af Amer: >60 mL/min (ref >60)
GLUCOSE: 91 mg/dL (ref 65–99)
POTASSIUM: 3.6 mmol/L (ref 3.5–5.1)
SODIUM: 136 mmol/L (ref 135–145)

## 2016-10-17 LAB — CBC
HCT: 32 % — ABNORMAL LOW (ref 39.0–52.0)
HEMOGLOBIN: 11.1 g/dL — AB (ref 13.0–17.0)
MCH: 31.9 pg (ref 26.0–34.0)
MCHC: 34.7 g/dL (ref 30.0–36.0)
MCV: 92 fL (ref 78.0–100.0)
Platelets: 158 10*3/uL (ref 150–400)
RBC: 3.48 MIL/uL — AB (ref 4.22–5.81)
RDW: 12.7 % (ref 11.5–15.5)
WBC: 8.1 10*3/uL (ref 4.0–10.5)

## 2016-10-17 MED ORDER — SODIUM CHLORIDE 0.9 % IV BOLUS (SEPSIS): 1000.0000 mL | Freq: Once | INTRAVENOUS | Status: AC

## 2016-10-17 MED ORDER — POLYETHYLENE GLYCOL 3350 17 G PO PACK
17.0000 g | PACK | Freq: Once | ORAL | Status: AC
  Administered 2016-10-17: 17 g via ORAL
  Filled 2016-10-17: qty 1

## 2016-10-17 MED ORDER — OXYCODONE HCL 5 MG PO TABS: 5.0000 mg | ORAL_TABLET | Freq: Four times a day (QID) | ORAL | Status: DC | PRN

## 2016-10-17 MED ORDER — CARVEDILOL 3.125 MG PO TABS
3.1250 mg | ORAL_TABLET | Freq: Two times a day (BID) | ORAL | Status: DC
  Filled 2016-10-17: qty 1

## 2016-10-17 NOTE — Progress Notes (Signed)
Pt ambulated 1200 feet. Denied feeling lightheaded or dizzy. Prior to ambulation BP 124/59 and HR 69. Post ambulation BP 126/65 and HR 65. BP are without any beta blockers or ace inhibitors. Pt did not receive Coreg today r/t HR being below 60 at scheduled times. Pt did not receive Lisinopril this am r/t bradycardia and dizziness.   Leonidas Romberg, RN

## 2016-10-17 NOTE — Progress Notes (Signed)
Got pt up to go to bathroom and sit in chair, pt got slightly dizzy, HR was 45, B/P was 105/49. PA still on unit, notified her and received order to give 1liter normal saline bolus and to hold b/p medications. Marisue Ivan RN

## 2016-10-17 NOTE — Progress Notes (Addendum)
Progress Note    10/17/2016 7:48 AM 1 Day Post-Op  Subjective:  c/o headache last night-improved with Tylenol.  Tm 99.4 now afebrile HR 50's SB 90's-100's systolic 96% RA  Vitals:   10/17/16 0306 10/17/16 0727  BP: (!) 109/51 (!) 106/50  Pulse: (!) 56 (!) 51  Resp:  16  Temp: 99.4 F (37.4 C) 98.3 F (36.8 C)     Physical Exam: Neuro:  In tact; tongue is midline Lungs:  Non labored Incision:  Clean and dry without hematoma Abdomen:  Tender to palpation RLQ/right groin  CBC    Component Value Date/Time   WBC 8.1 10/17/2016 0421   RBC 3.48 (L) 10/17/2016 0421   HGB 11.1 (L) 10/17/2016 0421   HCT 32.0 (L) 10/17/2016 0421   PLT 158 10/17/2016 0421   MCV 92.0 10/17/2016 0421   MCH 31.9 10/17/2016 0421   MCHC 34.7 10/17/2016 0421   RDW 12.7 10/17/2016 0421   LYMPHSABS 2.6 05/18/2016 1831   MONOABS 0.6 05/18/2016 1831   EOSABS 0.3 05/18/2016 1831   BASOSABS 0.0 05/18/2016 1831    BMET    Component Value Date/Time   NA 136 10/17/2016 0421   K 3.6 10/17/2016 0421   CL 104 10/17/2016 0421   CO2 25 10/17/2016 0421   GLUCOSE 91 10/17/2016 0421   BUN 10 10/17/2016 0421   CREATININE 0.79 10/17/2016 0421   CREATININE 0.79 03/16/2016 1006   CALCIUM 8.5 (L) 10/17/2016 0421   GFRNONAA >60 10/17/2016 0421   GFRAA >60 10/17/2016 0421     Intake/Output Summary (Last 24 hours) at 10/17/16 0748 Last data filed at 10/17/16 4098  Gross per 24 hour  Intake           3412.5 ml  Output             1800 ml  Net           1612.5 ml     Assessment/Plan:  This is a 61 y.o. male who is s/p left CEA 1 Day Post-Op  -pt out of bed to go to the bathroom and c/o dizziness-HR is 45bpm and systolic 105.  Will give 1L fluid bolus.  Hold Coreg and Lisinopril for now. -pt neuro exam is intact.  He did have a headache overnight that has improved with Tylenol.  Dr. Imogene Burn discussed the importance of monitoring his blood pressure and reperfusion sx. -drain with minimal output-will  discontinue. -pt does c/o of RLQ abdominal pain and is tender to palpation.  Will order 2 view abdominal x-ray.  No Leukocytosis and only low grade fever this am (most likely related to peri-op/atelectasis) and now afebrile.   -pt has ambulated in the halls -pt has voided-he did require I&O cath yesterday, but has voided overnight    Doreatha Massed, PA-C [facility] [phone]  Addendum  I have independently interviewed and examined the patient, and I agree with the physician assistant's findings.  Neuro exam completely intact.  No evidence of nerve injury.  Pt has on abd exam diffuse RLQ TTP without point tenderness.  This was present preop.   There is no evidence of complications (no hematoma, PSA or echymosis) from his carotid angiogram which involved an uneventful R CFA cannulation and sheath removal.  - Doubt hyperperfusion as blood pressure is on the lower side.   - Resume anti-HTN at home as BP comes back.   - Start with 2-view abd Xray.    CBC Latest Ref Rng & Units  10/17/2016 10/16/2016 10/16/2016  WBC 4.0 - 10.5 K/uL 8.1 9.4 8.9  Hemoglobin 13.0 - 17.0 g/dL 11.1(L) 11.6(L) 13.5  Hematocrit 39.0 - 52.0 % 32.0(L) 34.0(L) 40.4  Platelets 150 - 400 K/uL 158 177 194    Leonides Sake, MD, [facility] [place] Office: [phone] Pager: [phone]  10/17/2016, 8:11 AM   Addendum  Radiology   Dg Abd 2 Views  Result Date: 10/17/2016 CLINICAL DATA:  Right lower quadrant pain for 1 week. Approximately 1 week status post endoscopy. EXAM: ABDOMEN - 2 VIEW COMPARISON:  None. FINDINGS: No evidence of dilated bowel loops. No evidence of free intraperitoneal air. Moderate colonic stool noted. No radiopaque calculi or other significant abnormality identified. IMPRESSION: No acute findings. Electronically Signed   By: Myles Rosenthal M.D.   On: 10/17/2016 08:45   Anti-HTN still being held.  Will keep another night to observe for development of an  cerebral hyperperfusion.  Will give the patient some constipation agents per pt's preference.  Leonides Sake, MD, FACS [facility] [place] Office: [phone] Pager: [phone]  10/17/2016, 10:29 AM

## 2016-10-17 NOTE — Progress Notes (Signed)
Pt received from 4E. Pt oriented to room and equipment. VSS. Telemetry applied, CCMD notified. Call bell within reach, pt denies needs at this.   Leonidas Romberg, RN

## 2016-10-17 NOTE — Anesthesia Postprocedure Evaluation (Addendum)
Anesthesia Post Note  Patient: Travis Neuner  Procedure(s) Performed: Procedure(s) (LRB): LEFT CAROTID ENDARTERECTOMY WITH PATCH ANGIOPLASTY (Left)  Patient location during evaluation: PACU Anesthesia Type: General Level of consciousness: awake and alert Pain management: pain level controlled Vital Signs Assessment: post-procedure vital signs reviewed and stable Respiratory status: spontaneous breathing, nonlabored ventilation, respiratory function stable and patient connected to nasal cannula oxygen Cardiovascular status: blood pressure returned to baseline and stable Postop Assessment: no signs of nausea or vomiting Anesthetic complications: no       Last Vitals:  Vitals:   10/17/16 0727 10/17/16 1131  BP: (!) 106/50 (!) 106/55  Pulse: (!) 51 65  Resp: 16 (!) 24  Temp: 36.8 C 36.8 C    Last Pain:  Vitals:   10/17/16 1131  TempSrc: Oral  PainSc: 4                  Zareth Rippetoe

## 2016-10-17 NOTE — Progress Notes (Signed)
Called report to [facility] RN on 2W, will transfer pt in wheelchair. Marisue Ivan RN

## 2016-10-18 ENCOUNTER — Encounter: Payer: Self-pay | Admitting: Vascular Surgery

## 2016-10-18 NOTE — Progress Notes (Signed)
Discharge instructions given. Pt verbalized understanding and all questions were answered.  

## 2016-10-18 NOTE — Progress Notes (Addendum)
[facility] [place]  Subjective  - Walking, HA gone.   Objective (!) 119/58 (!) 57 98.6 F (37 C) (Oral) 18 99%  Intake/Output Summary (Last 24 hours) at 10/18/16 16100712 Last data filed at 10/17/16 1700  Gross per 24 hour  Intake             3085 ml  Output             1850 ml  Net             1235 ml   ABD x ray   FINDINGS: No evidence of dilated bowel loops. No evidence of free intraperitoneal air. Moderate colonic stool noted. No radiopaque calculi or other significant abnormality identified.   Ambulating in halls independently No facial droop, no tongue deviation Heart RRR 88 bpm Lungs non labored breathing  Assessment/Planning: POD # 2 left CEA  Ha has subsides Abdominal pain likely constipation Voiding independently Ambulating independently He has had some issues with hypertension will advice him to continue with his Coreg and add lisinopril later if his pressures rise.  He should make a follow up visit with his PCP to check his BP medication in the next 3-4 weeks. F/U with DR. Bader Stubblefield in 2-3 weeks  [name] Sosa, [name] 10/18/2016 7:12 AM   Addendum  I have independently interviewed and examined the patient, and I agree with the physician assistant's findings.    Leonides SakeBrian Quaniya Damas, MD, FACS [facility] [place] Office: [phone] Pager: [phone]  10/18/2016, 7:28 AM   --  Laboratory Lab Results:  Recent Labs  10/16/16 1650 10/17/16 0421  WBC 9.4 8.1  HGB 11.6* 11.1*  HCT 34.0* 32.0*  PLT 177 158   BMET  Recent Labs  10/16/16 0630 10/16/16 1650 10/17/16 0421  NA 140  --  136  K 5.1  --  3.6  CL 105  --  104  CO2 31  --  25  GLUCOSE 97  --  91  BUN 13  --  10  CREATININE 0.82 0.69 0.79  CALCIUM 9.7  --  8.5*    COAG Lab Results  Component Value Date   INR 0.99 10/16/2016   INR 1.03 05/18/2016   INR 1.03 04/26/2016   No results found for: PTT

## 2016-10-18 NOTE — Progress Notes (Addendum)
   Daily Progress Note   Assessment/Planning:   POD #2 s/p L CEA for ulcerated plaque in L ICA   Neuro intact  Neck without hematoma  Pt walking halls without difficulty  BP normal at this point: restart Coreg, slowly add rest of agents.  I discussed the criteria for adding Lisinopril back, SBP 130-140  Ok to D/C  Follow up in 2 weeks   Subjective  - 2 Days Post-Op   Abd pain somewhat better, no events overnight   Objective   Vitals:   10/17/16 1756 10/17/16 1806 10/17/16 2218 10/18/16 0602  BP: (!) 124/59 126/65 (!) 118/45 (!) 119/58  Pulse: 69 65 73 (!) 57  Resp: 18 18 18 18   Temp:   98.7 F (37.1 C) 98.6 F (37 C)  TempSrc:   Oral Oral  SpO2: 100% 100% 100% 99%  Weight:      Height:         Intake/Output Summary (Last 24 hours) at 10/18/16 0714 Last data filed at 10/17/16 1700  Gross per 24 hour  Intake             3085 ml  Output             1850 ml  Net             1235 ml     PULM  CTAB  CV  RRR  GI  soft, NTND  VASC L neck inc c/d/I, no hematoma  NEURO CN 2-12 intact, motor sym 5/5, pt walking the hallways    Laboratory CBC CBC Latest Ref Rng & Units 10/17/2016 10/16/2016 10/16/2016  WBC 4.0 - 10.5 K/uL 8.1 9.4 8.9  Hemoglobin 13.0 - 17.0 g/dL 11.1(L) 11.6(L) 13.5  Hematocrit 39.0 - 52.0 % 32.0(L) 34.0(L) 40.4  Platelets 150 - 400 K/uL 158 177 194     BMET    Component Value Date/Time   NA 136 10/17/2016 0421   K 3.6 10/17/2016 0421   CL 104 10/17/2016 0421   CO2 25 10/17/2016 0421   GLUCOSE 91 10/17/2016 0421   BUN 10 10/17/2016 0421   CREATININE 0.79 10/17/2016 0421   CREATININE 0.79 03/16/2016 1006   CALCIUM 8.5 (L) 10/17/2016 0421   GFRNONAA >60 10/17/2016 0421   GFRAA >60 10/17/2016 0421      Leonides SakeBrian Malaika Arnall, MD, FACS [facility] [place] Office: [phone] Pager: [phone]  10/18/2016, 7:14 AM

## 2016-10-19 NOTE — Discharge Summary (Signed)
[facility] Discharge Summary   Patient ID:  [name] Sosa MRN: [id number] DOB/AGE: 60-Jul-1957 60 y.o.  Admit date: 10/16/2016 Discharge date: 10/18/2016 Date of Surgery: 10/16/2016 Surgeon: Surgeon(s): Fransisco Hertz, MD Maeola Harman, MD  Admission Diagnosis: Left carotid artery stenosis I65.22  Discharge Diagnoses:  Left carotid artery stenosis I65.22  Secondary Diagnoses: Past Medical History:  Diagnosis Date  . CAD (coronary artery disease)    s/p 7 vessel CABG 2000 at [facility]  . High cholesterol   . Hyperlipidemia   . Hypertension   . Pre-diabetes    < 6.1 2017  . Stroke (HCC) 04/2016   TIA- 04/2016 double vision 2nd - speech - no residual  . 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 (1956/05/11) male who presents with chief complaint: recurrent TIA.  Previous carotid studies demonstrated: RICA <50% stenosis, LICA >70% stenosis.  Patient has history of TIA symptoms.  The patient initially had diplopia for about 4 months which resolved spontaneously.  He then had episodes of slurred speech.  This was short lived and resolved.  This has recurred multiple times with the most recent in the last week.  The patient has never had amaurosis fugax or monocular blindness.  The patient has never had facial drooping or hemiplegia.  The patient has never had receptive aphasia.   The patient's previous neurologic deficits have resolved.  The patient's risks factors for carotid disease include: HLD, HTN, and prior smoking.  Reviewing the neurologist notes, the patient was previously diagnosed with a Trochlear neuropathy which has resolved at this point.  The patient also had reported facial drooping with these TIA episodes.  Outside CTA reported demonstrated some ulceration in the L carotid plaque.   Hospital Course:  [name] Sosa is a  61 y.o. male is S/P Left Procedure(s): LEFT CAROTID ENDARTERECTOMY WITH PATCH ANGIOPLASTY POD# 1 -pt out of bed to go to the bathroom and c/o dizziness-HR is 45bpm and systolic 105.  Will give 1L fluid bolus.  Hold Coreg and Lisinopril for now. -pt neuro exam is intact.  He did have a headache overnight that has improved with Tylenol.  Dr. Imogene Burn discussed the importance of monitoring his blood pressure and reperfusion sx. -drain with minimal output-will discontinue. -pt does c/o of RLQ abdominal pain and is tender to palpation.  Will order 2 view abdominal x-ray.  No Leukocytosis and only low grade fever this am (most likely related to peri-op/atelectasis) and now afebrile.   -pt has ambulated in the halls -pt has voided-he did require I&O cath yesterday, but has voided overnight  POD # 2 left CEA  [name] Sosa has subsides Abdominal pain likely constipation Voiding independently Ambulating independently He has had some issues with hypertension will advice him to continue with his Coreg and add lisinopril later if his pressures rise.  He should make a follow up visit with his PCP to check his BP medication in the next 3-4 weeks. F/U with DR. Draylen Lobue in 2-3 weeks    Significant Diagnostic Studies: CBC Lab Results  Component Value Date   WBC 8.1 10/17/2016   HGB 11.1 (L) 10/17/2016   HCT 32.0 (L) 10/17/2016   MCV 92.0 10/17/2016   PLT 158 10/17/2016    BMET    Component Value Date/Time   NA 136 10/17/2016 0421   K 3.6 10/17/2016 0421   CL 104 10/17/2016 0421  CO2 25 10/17/2016 0421   GLUCOSE 91 10/17/2016 0421   BUN 10 10/17/2016 0421   CREATININE 0.79 10/17/2016 0421   CREATININE 0.79 03/16/2016 1006   CALCIUM 8.5 (L) 10/17/2016 0421   GFRNONAA >60 10/17/2016 0421   GFRAA >60 10/17/2016 0421   COAG Lab Results  Component Value Date   INR 0.99 10/16/2016   INR 1.03 05/18/2016   INR 1.03 04/26/2016     Disposition:  Discharge to :Home Discharge Instructions    CAROTID  Sugery: Call MD for difficulty swallowing or speaking; weakness in arms or legs that is a new symtom; severe headache.  If you have increased swelling in the neck and/or  are having difficulty breathing, CALL 911    Complete by:  As directed    Call MD for:  redness, tenderness, or signs of infection (pain, swelling, bleeding, redness, odor or green/yellow discharge around incision site)    Complete by:  As directed    Call MD for:  severe or increased pain, loss or decreased feeling  in affected limb(s)    Complete by:  As directed    Call MD for:  temperature >100.5    Complete by:  As directed    Discharge patient    Complete by:  As directed    Discharge disposition:  01-Home or Self Care   Discharge patient date:  10/18/2016   Discharge wound care:    Complete by:  As directed    Shower daily with soap and water starting 10/19/16.  Continue taking your Coreg daily and monitor your BP.  If the systolic(top number) is higher than 130-140 take your Lisinopril.  If the systolic(top number) stays below 130 then hold the Lisinopril.  Make a follow up appointment with your Primary care doctor in the next 3-4 weeks for a blood pressure check.   Driving Restrictions    Complete by:  As directed    No driving for 2 weeks   Lifting restrictions    Complete by:  As directed    No lifting for 2 weeks   Resume previous diet    Complete by:  As directed      Allergies as of 10/18/2016      Reactions   No Known Allergies       Medication List    STOP taking these medications   lisinopril 20 MG tablet Commonly known as:  PRINIVIL,ZESTRIL     TAKE these medications   aspirin 81 MG chewable tablet Chew 81 mg by mouth daily.   carvedilol 3.125 MG tablet Commonly known as:  COREG Take 1 tablet (3.125 mg total) by mouth 2 (two) times daily with a meal.   cholecalciferol 1000 units tablet Commonly known as:  VITAMIN D Take 1,000 Units by mouth daily.   multivitamin with minerals tablet Take  1 tablet by mouth 3 (three) times a week.   nitroGLYCERIN 0.4 MG SL tablet Commonly known as:  NITROSTAT Place 1 tablet (0.4 mg total) under the tongue every 5 (five) minutes as needed for chest pain (up to 3 doses).   oxyCODONE-acetaminophen 5-325 MG tablet Commonly known as:  ROXICET Take 1 tablet by mouth every 6 (six) hours as needed for severe pain.   rosuvastatin 10 MG tablet Commonly known as:  CRESTOR Take 1 tablet (10 mg total) by mouth daily.      Verbal and written Discharge instructions given to the patient. Wound care per Discharge AVS Follow-up Information  Leonides SakeBrian Jaxsyn Azam, MD Follow up in 2 week(s).   Specialties:  Vascular Surgery, Cardiology Why:  Office will call you to arrange your appt (sent) Contact information: [address] [place] [address] [phone]        [facility] Follow up.   Why:  please call on Monday 5/7 to schedule follow up apt with Scot Juniffany Noel.   Contact information: [address] [place] [phone]          Signed: Clinton GallantCOLLINS, EMMA [facility] 10/19/2016, 10:30 AM   Addendum  This patient underwent a L CEA for presumed ulcerated L ICA plaque that might have been responsible for his TIA like sx.  His intraop findings are consistent with unroof plaque with necrotic core.  His surgery was unremarkable.  His BP was somewhat low post-op, so he was observed one extra day to may certain his BP remained acceptable.  He was discharged on POD #2 with no neurologic defects.  He was voiding like normal.  His RLQ abdominal pain was resolving.  He will follow up in the office in 2 weeks.   Leonides SakeBrian Keaston Pile, MD, FACS [facility] [place] Office: [phone] Pager: [phone]  10/21/2016, 6:31 PM   --- For VQI Registry use --- Instructions: Press F2 to tab through selections.  Delete question if not applicable.   Modified Rankin score at D/C (0-6): Rankin  Score=0  IV medication needed for:  1. Hypertension: No 2. Hypotension: Yes  Post-op Complications: No  1. Post-op CVA or TIA: No  If yes: Event classification (right eye, left eye, right cortical, left cortical, verterobasilar, other):   If yes: Timing of event (intra-op, <6 hrs post-op, >=6 hrs post-op, unknown):   2. CN injury: No  If yes: CN  injuried   3. Myocardial infarction: No  If yes: Dx by (EKG or clinical, Troponin):   4.  CHF: No  5.  Dysrhythmia (new): No  6. Wound infection: No  7. Reperfusion symptoms: No  8. Return to OR: No  If yes: return to OR for (bleeding, neurologic, other CEA incision, other):   Discharge medications: Statin use:  Yes ASA use:  Yes Beta blocker use:  Yes ACE-Inhibitor use:  Yes P2Y12 Antagonist use: [x ] None, [ ]  Plavix, [ ]  Plasugrel, [ ]  Ticlopinine, [ ]  Ticagrelor, [ ]  Other, [ ]  No for medical reason, [ ]  Non-compliant, [ ]  Not-indicated Anti-coagulant use:  [x ] None, [ ]  Warfarin, [ ]  Rivaroxaban, [ ]  Dabigatran, [ ]  Other, [ ]  No for medical reason, [ ]  Non-compliant, [ ]  Not-indicated

## 2016-10-22 ENCOUNTER — Encounter: Payer: Self-pay | Admitting: *Deleted

## 2016-10-24 ENCOUNTER — Ambulatory Visit (INDEPENDENT_AMBULATORY_CARE_PROVIDER_SITE_OTHER): Payer: Self-pay | Admitting: Family Medicine

## 2016-10-24 ENCOUNTER — Encounter: Payer: Self-pay | Admitting: Family Medicine

## 2016-10-24 ENCOUNTER — Telehealth: Payer: Self-pay

## 2016-10-24 VITALS — BP 116/67 | HR 61 | Temp 97.4°F | Resp 16 | Ht 67.99 in | Wt 122.0 lb

## 2016-10-24 DIAGNOSIS — R946 Abnormal results of thyroid function studies: Secondary | ICD-10-CM

## 2016-10-24 DIAGNOSIS — R7303 Prediabetes: Secondary | ICD-10-CM

## 2016-10-24 DIAGNOSIS — Z13220 Encounter for screening for lipoid disorders: Secondary | ICD-10-CM

## 2016-10-24 DIAGNOSIS — R1903 Right lower quadrant abdominal swelling, mass and lump: Secondary | ICD-10-CM

## 2016-10-24 DIAGNOSIS — R7989 Other specified abnormal findings of blood chemistry: Secondary | ICD-10-CM

## 2016-10-24 DIAGNOSIS — I679 Cerebrovascular disease, unspecified: Secondary | ICD-10-CM

## 2016-10-24 DIAGNOSIS — Z1211 Encounter for screening for malignant neoplasm of colon: Secondary | ICD-10-CM

## 2016-10-24 DIAGNOSIS — Q752 Hypertelorism: Secondary | ICD-10-CM

## 2016-10-24 LAB — COMPLETE METABOLIC PANEL WITH GFR
ALT: 18 U/L (ref 9–46)
AST: 22 U/L (ref 10–35)
Albumin: 4.2 g/dL (ref 3.6–5.1)
Alkaline Phosphatase: 49 U/L (ref 40–115)
BUN: 14 mg/dL (ref 7–25)
CO2: 27 mmol/L (ref 20–31)
CREATININE: 0.8 mg/dL (ref 0.70–1.25)
Calcium: 9.5 mg/dL (ref 8.6–10.3)
Chloride: 104 mmol/L (ref 98–110)
GFR, Est African American: >89 mL/min (ref >=60)
GFR, Est Non African American: >89 mL/min (ref >=60)
Glucose, Bld: 78 mg/dL (ref 65–99)
POTASSIUM: 4.7 mmol/L (ref 3.5–5.3)
Sodium: 141 mmol/L (ref 135–146)
Total Bilirubin: 0.4 mg/dL (ref 0.2–1.2)
Total Protein: 6.8 g/dL (ref 6.1–8.1)

## 2016-10-24 LAB — CBC WITH DIFFERENTIAL/PLATELET
BASOS ABS: 0 {cells}/uL (ref 0–200)
BASOS PCT: 0 %
EOS ABS: 240 {cells}/uL (ref 15–500)
Eosinophils Relative: 4 %
HEMATOCRIT: 37.3 % — AB (ref 38.5–50.0)
Hemoglobin: 12.3 g/dL — ABNORMAL LOW (ref 13.2–17.1)
Lymphocytes Relative: 29 %
Lymphs Abs: 1740 cells/uL (ref 850–3900)
MCH: 30.8 pg (ref 27.0–33.0)
MCHC: 33 g/dL (ref 32.0–36.0)
MCV: 93.5 fL (ref 80.0–100.0)
MONO ABS: 420 {cells}/uL (ref 200–950)
MONOS PCT: 7 %
MPV: 9.9 fL (ref 7.5–12.5)
NEUTROS ABS: 3600 {cells}/uL (ref 1500–7800)
Neutrophils Relative %: 60 %
Platelets: 357 10*3/uL (ref 140–400)
RBC: 3.99 MIL/uL — ABNORMAL LOW (ref 4.20–5.80)
RDW: 13.5 % (ref 11.0–15.0)
WBC: 6 10*3/uL (ref 3.8–10.8)

## 2016-10-24 LAB — LIPID PANEL
CHOL/HDL RATIO: 3.4 ratio (ref <5.0)
CHOLESTEROL: 131 mg/dL (ref <200)
HDL: 38 mg/dL — ABNORMAL LOW (ref >40)
LDL Cholesterol: 70 mg/dL (ref <100)
TRIGLYCERIDES: 114 mg/dL (ref <150)
VLDL: 23 mg/dL (ref <30)

## 2016-10-24 LAB — POCT URINALYSIS DIP (DEVICE)
BILIRUBIN URINE: NEGATIVE
Glucose, UA: NEGATIVE mg/dL
HGB URINE DIPSTICK: NEGATIVE
Ketones, ur: NEGATIVE mg/dL
LEUKOCYTES UA: NEGATIVE
NITRITE: NEGATIVE
PH: 6 (ref 5.0–8.0)
Protein, ur: NEGATIVE mg/dL
Specific Gravity, Urine: 1.015 (ref 1.005–1.030)
Urobilinogen, UA: 0.2 mg/dL (ref 0.0–1.0)

## 2016-10-24 MED ORDER — LOSARTAN POTASSIUM 25 MG PO TABS: 12.5000 mg | ORAL_TABLET | Freq: Every day | ORAL | 1 refills | Status: DC

## 2016-10-24 MED FILL — LOSARTAN POTASSIUM 25 MG TA: 25 | 30 days supply | Qty: 30 | Fill #0

## 2016-10-24 NOTE — Telephone Encounter (Signed)
US scheduled for May 23rd and patient notified of appointment.

## 2016-10-24 NOTE — Patient Instructions (Addendum)
Discontinue Lisinopril and start Losartan 12.5 mg once daily.  Continue to monitor blood pressure once in the morning and prior to bedtime.  Return for follow-up in 3 months.  Start a cardiovascular healthy diet.    DASH Eating Plan DASH stands for "Dietary Approaches to Stop Hypertension." The DASH eating plan is a healthy eating plan that has been shown to reduce high blood pressure (hypertension). It may also reduce your risk for type 2 diabetes, heart disease, and stroke. The DASH eating plan may also help with weight loss. What are tips for following this plan? General guidelines   Avoid eating more than 2,300 mg (milligrams) of salt (sodium) a day. If you have hypertension, you may need to reduce your sodium intake to 1,500 mg a day.  Limit alcohol intake to no more than 1 drink a day for nonpregnant women and 2 drinks a day for men. One drink equals 12 oz of beer, 5 oz of wine, or 1 oz of hard liquor.  Work with your health care provider to maintain a healthy body weight or to lose weight. Ask what an ideal weight is for you.  Get at least 30 minutes of exercise that causes your heart to beat faster (aerobic exercise) most days of the week. Activities may include walking, swimming, or biking.  Work with your health care provider or diet and nutrition specialist (dietitian) to adjust your eating plan to your individual calorie needs. Reading food labels   Check food labels for the amount of sodium per serving. Choose foods with less than 5 percent of the Daily Value of sodium. Generally, foods with less than 300 mg of sodium per serving fit into this eating plan.  To find whole grains, look for the word "whole" as the first word in the ingredient list. Shopping   Buy products labeled as "low-sodium" or "no salt added."  Buy fresh foods. Avoid canned foods and premade or frozen meals. Cooking   Avoid adding salt when cooking. Use salt-free seasonings or herbs instead of  table salt or sea salt. Check with your health care provider or pharmacist before using salt substitutes.  Do not fry foods. Cook foods using healthy methods such as baking, boiling, grilling, and broiling instead.  Cook with heart-healthy oils, such as olive, canola, soybean, or sunflower oil. Meal planning    Eat a balanced diet that includes:  5 or more servings of fruits and vegetables each day. At each meal, try to fill half of your plate with fruits and vegetables.  Up to 6-8 servings of whole grains each day.  Less than 6 oz of lean meat, poultry, or fish each day. A 3-oz serving of meat is about the same size as a deck of cards. One egg equals 1 oz.  2 servings of low-fat dairy each day.  A serving of nuts, seeds, or beans 5 times each week.  Heart-healthy fats. Healthy fats called Omega-3 fatty acids are found in foods such as flaxseeds and coldwater fish, like sardines, salmon, and mackerel.  Limit how much you eat of the following:  Canned or prepackaged foods.  Food that is high in trans fat, such as fried foods.  Food that is high in saturated fat, such as fatty meat.  Sweets, desserts, sugary drinks, and other foods with added sugar.  Full-fat dairy products.  Do not salt foods before eating.  Try to eat at least 2 vegetarian meals each week.  Eat more home-cooked food and less  restaurant, buffet, and fast food.  When eating at a restaurant, ask that your food be prepared with less salt or no salt, if possible. What foods are recommended? The items listed may not be a complete list. Talk with your dietitian about what dietary choices are best for you. Grains  Whole-grain or whole-wheat bread. Whole-grain or whole-wheat pasta. Brown rice. [place]ena Morrow. Bulgur. Whole-grain and low-sodium cereals. Pita bread. Low-fat, low-sodium crackers. Whole-wheat flour tortillas. Vegetables  Fresh or frozen vegetables (raw, steamed, roasted, or grilled). Low-sodium or  reduced-sodium tomato and vegetable juice. Low-sodium or reduced-sodium tomato sauce and tomato paste. Low-sodium or reduced-sodium canned vegetables. Fruits  All fresh, dried, or frozen fruit. Canned fruit in natural juice (without added sugar). Meat and other protein foods  Skinless chicken or [place]. Ground chicken or [place]. Pork with fat trimmed off. Fish and seafood. Egg whites. Dried beans, peas, or lentils. Unsalted nuts, nut butters, and seeds. Unsalted canned beans. Lean cuts of beef with fat trimmed off. Low-sodium, lean deli meat. Dairy  Low-fat (1%) or fat-free (skim) milk. Fat-free, low-fat, or reduced-fat cheeses. Nonfat, low-sodium ricotta or cottage cheese. Low-fat or nonfat yogurt. Low-fat, low-sodium cheese. Fats and oils  Soft margarine without trans fats. Vegetable oil. Low-fat, reduced-fat, or light mayonnaise and salad dressings (reduced-sodium). Canola, safflower, olive, soybean, and sunflower oils. Avocado. Seasoning and other foods  Herbs. Spices. Seasoning mixes without salt. Unsalted popcorn and pretzels. Fat-free sweets. What foods are not recommended? The items listed may not be a complete list. Talk with your dietitian about what dietary choices are best for you. Grains  Baked goods made with fat, such as croissants, muffins, or some breads. Dry pasta or rice meal packs. Vegetables  Creamed or fried vegetables. Vegetables in a cheese sauce. Regular canned vegetables (not low-sodium or reduced-sodium). Regular canned tomato sauce and paste (not low-sodium or reduced-sodium). Regular tomato and vegetable juice (not low-sodium or reduced-sodium). Angie Fava. Olives. Fruits  Canned fruit in a light or heavy syrup. Fried fruit. Fruit in cream or butter sauce. Meat and other protein foods  Fatty cuts of meat. Ribs. Fried meat. Berniece Salines. Sausage. Bologna and other processed lunch meats. Salami. Fatback. Hotdogs. Bratwurst. Salted nuts and seeds. Canned beans with added salt.  Canned or smoked fish. Whole eggs or egg yolks. Chicken or [place] with skin. Dairy  Whole or 2% milk, cream, and half-and-half. Whole or full-fat cream cheese. Whole-fat or sweetened yogurt. Full-fat cheese. Nondairy creamers. Whipped toppings. Processed cheese and cheese spreads. Fats and oils  Butter. Stick margarine. Lard. Shortening. Ghee. Bacon fat. Tropical oils, such as coconut, palm kernel, or palm oil. Seasoning and other foods  Salted popcorn and pretzels. Onion salt, garlic salt, seasoned salt, table salt, and sea salt. Worcestershire sauce. Tartar sauce. Barbecue sauce. Teriyaki sauce. Soy sauce, including reduced-sodium. Steak sauce. Canned and packaged gravies. Fish sauce. Oyster sauce. Cocktail sauce. Horseradish that you find on the shelf. Ketchup. Mustard. Meat flavorings and tenderizers. Bouillon cubes. Hot sauce and Tabasco sauce. Premade or packaged marinades. Premade or packaged taco seasonings. Relishes. Regular salad dressings. Where to find more information:  National Heart, Lung, and [place]: https://wilson-eaton.com/  American Heart Association: www.heart.org Summary  The DASH eating plan is a healthy eating plan that has been shown to reduce high blood pressure (hypertension). It may also reduce your risk for type 2 diabetes, heart disease, and stroke.  With the DASH eating plan, you should limit salt (sodium) intake to 2,300 mg a day. If you have hypertension,  you may need to reduce your sodium intake to 1,500 mg a day.  When on the DASH eating plan, aim to eat more fresh fruits and vegetables, whole grains, lean proteins, low-fat dairy, and heart-healthy fats.  Work with your health care provider or diet and nutrition specialist (dietitian) to adjust your eating plan to your individual calorie needs. This information is not intended to replace advice given to you by your health care provider. Make sure you discuss any questions you have with your health care  provider. Document Released: 05/24/2011 Document Revised: 05/28/2016 Document Reviewed: 05/28/2016 Elsevier Interactive Patient Education  2017 [employer].

## 2016-10-24 NOTE — Telephone Encounter (Signed)
-----   Message from Bing NeighborsKimberly S Harris, FNP sent at 10/24/2016  1:48 PM EDT ----- Schedule ultrasound within two weeks

## 2016-10-24 NOTE — Progress Notes (Signed)
Patient ID: [name] Sosa, male    DOB: [birth year], 61 y.o.   MRN: [id number]  PCP: Bing Neighbors, FNP  Chief Complaint  Patient presents with  . Establish Care    Subjective:  HPI [name] Sosa is a 61 y.o. male presents to establish care and evaluation s/p left carotid endarterectomy. Medical problems include: CAD w/artery graft bypass, Hypertension, Angina,  Hyperlipidemia.  Background Information leading up to Endarterectomy  In, November 2017 patient began experiencing stroke- like symptoms with double vision and dizziness.  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 At some point, he began to experience more persistent dizziness and slurring of speech which was later diagnosed as TIA. Carotid Dopplers were ordered and left carotid stenosis was determined. He underwent left carotid endarterectomy on  10/16/2016 without significant complications. He was kept an extra night for observation related to rather soft blood pressures. [name] Sosa takes Coreg and lisinopril for CAD and hypertension. He has a chronic and cough (non-smoker) which he attributes to taking lisinopril. He has been hesitant  to change medication as he has been able to afford lisinopril on the $4.00 medication list.  Today, [name] denies any dizziness, headaches, shortness of breath, chest pain, or visual disturbances.  Lower Right quadrant nodule Noticed an area in his right lower abdomen and pelvic region after procedure.  The area was mildly painful while in the hospital, however the pain has increased significantly since being out of the hospital and more active. He has taken Ibuprofen with minimal relief of pain. Pain is dull and exacerbated by walking and sitting up. He has taking laxative agents thinking this could be constipation, however is bowels are regular and pain is still present.  Social History   Social History  . Marital status: Single    Spouse name: N/A  . Number of  children: 3  . Years of education: AS + 2   Occupational History  . [employer]    Social History Main Topics  . Smoking status: Former Smoker    Years: 2.00    Quit date: 06/19/1998  . Smokeless tobacco: Never Used  . Alcohol use No  . Drug use: No  . Sexual activity: Yes   Other Topics Concern  . Not on file   Social History Narrative   Lives at home alone   Right-handed   Caffeine: 2 cups per day    Family History  Problem Relation Age of Onset  . Heart failure Mother   . Hypertension Mother   . Heart failure Father   . Hypertension Father   . Heart disease Father     before age 91  . Hypertension Sister   . Hypertension Brother   . Heart attack Neg Hx   . Stroke Neg Hx    Review of Systems  See HPI  Patient Active Problem List   Diagnosis Date Noted  . Left carotid artery stenosis 10/16/2016  . Carotid artery disease (HCC) 10/05/2016  . Diplopia 05/03/2016  . 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 Unstable angina (HCC) 03/16/2016  . Coronary artery disease involving native coronary artery of native heart with unstable angina pectoris (HCC) 03/28/2011  . Hyperlipidemia 07/12/2009  . HYPERTENSION, BENIGN 07/12/2009  . CAD, ARTERY BYPASS GRAFT 07/12/2009  . CHEST PAIN-UNSPECIFIED 06/30/2009    Allergies  Allergen Reactions  . No Known Allergies     Prior to Admission medications   Medication Sig Start Date End Date Taking? Authorizing Provider  aspirin 81 MG chewable tablet Chew 81 mg by mouth daily.   Yes [provider]  carvedilol (COREG) 3.125 MG tablet Take 1 tablet (3.125 mg total) by mouth 2 (two) times daily with a meal. 04/20/16  Yes Kathleene HazelMcAlhany, Christopher D, MD  cholecalciferol (VITAMIN D) 1000 units tablet Take 1,000 Units by mouth daily.   Yes [provider]  Multiple Vitamins-Minerals (MULTIVITAMIN WITH MINERALS) tablet Take 1 tablet by mouth 3 (three) times a week.    Yes [provider]  nitroGLYCERIN (NITROSTAT)  0.4 MG SL tablet Place 1 tablet (0.4 mg total) under the tongue every 5 (five) minutes as needed for chest pain (up to 3 doses). 04/20/16  Yes Kathleene HazelMcAlhany, Christopher D, MD  oxyCODONE-acetaminophen (ROXICET) 5-325 MG tablet Take 1 tablet by mouth every 6 (six) hours as needed for severe pain. 10/16/16  Yes Rhyne, Samantha J, PA-C  rosuvastatin (CRESTOR) 10 MG tablet Take 1 tablet (10 mg total) by mouth daily. 04/20/16  Yes Kathleene HazelMcAlhany, Christopher D, MD    Past Medical, Surgical Family and Social History reviewed and updated.    Objective:   Today's Vitals   10/24/16 0909  BP: 116/67  Pulse: 61  Resp: 16  Temp: 97.4 F (36.3 C)  TempSrc: Oral  SpO2: 100%  Weight: 122 lb (55.3 kg)  Height: 5' 7.99" (1.727 m)    Wt Readings from Last 3 Encounters:  10/24/16 122 lb (55.3 kg)  10/16/16 126 lb 15.8 oz (57.6 kg)  10/12/16 123 lb 12.8 oz (56.2 kg)    Physical Exam  Constitutional: He is oriented to person, place, and time. He appears well-developed and well-nourished.  HENT:  Head: Normocephalic and atraumatic.  Eyes: Conjunctivae and EOM are normal. Pupils are equal, round, and reactive to light.  Neck: Normal range of motion. Neck supple.    Cardiovascular: Normal rate, regular rhythm, normal heart sounds and intact distal pulses.   Pulmonary/Chest: Effort normal and breath sounds normal.  Abdominal: Soft. Bowel sounds are normal. There is tenderness in the right lower quadrant.    Musculoskeletal: Normal range of motion.  Neurological: He is alert and oriented to person, place, and time.  Skin: Skin is warm and dry.  Psychiatric: He has a normal mood and affect. His behavior is normal. Judgment and thought content normal.    Assessment & Plan:  1. Decreased thyroid stimulating hormone (TSH) level - Thyroid Panel With TSH, rechecking as abnormal during hospital admission  2. Screening, lipid - Lipid panel  3. Screen for colon cancer -Ambulatory referral Gastroenterology    4. Cerebral vascular disease - COMPLETE METABOLIC PANEL WITH GFR - CBC with Differential/Platelet  5. Prediabetes - Hemoglobin A1c  6. RLQ abdominal mass - US Abdomen Complete; Future  7. Hypertension  -Discontinue lisinopril due to cough -Start Losartan 12.5 mg (1/2 tab) once daily.  [facility]   -Continue all other medications and keep all scheduled follow-ups.  RTC: 3 months hypertension and health maintenance   Godfrey PickKimberly S. Tiburcio PeaHarris, MSN, [facility] CenterFNP-C [facility] [address] [place], [place] [address] [phone]

## 2016-10-25 LAB — THYROID PANEL WITH TSH
Free Thyroxine Index: 2.1 (ref 1.4–3.8)
T3 UPTAKE: 29 % (ref 22–35)
T4, Total: 7.4 ug/dL (ref 4.5–12.0)
TSH: 0.57 m[IU]/L (ref 0.40–4.50)

## 2016-10-25 LAB — HEMOGLOBIN A1C
HEMOGLOBIN A1C: 5.5 % (ref <5.7)
Mean Plasma Glucose: 111 mg/dL

## 2016-10-26 ENCOUNTER — Encounter: Payer: Self-pay | Admitting: Vascular Surgery

## 2016-10-29 ENCOUNTER — Encounter: Payer: Self-pay | Admitting: Vascular Surgery

## 2016-10-29 ENCOUNTER — Telehealth: Payer: Self-pay

## 2016-10-29 DIAGNOSIS — E7849 Other hyperlipidemia: Secondary | ICD-10-CM

## 2016-10-29 MED ORDER — ROSUVASTATIN CALCIUM 10 MG PO TABS: 10.0000 mg | ORAL_TABLET | Freq: Every day | ORAL | 3 refills | Status: DC

## 2016-10-29 NOTE — Telephone Encounter (Signed)
Patient will have Colonoscopy report faxed to our office.

## 2016-10-29 NOTE — Telephone Encounter (Signed)
Crestor refilled and vm left for patient to callback

## 2016-10-29 NOTE — Progress Notes (Signed)
    Postoperative Visit   History of Present Illness   [name] Sosa is a 61 y.o. male who presents for postoperative follow-up for: left CEA (Date: 10/16/16) for necrotic unroofed plaque.  The patient's neck incision is healed.  The patient has had no new stroke or TIA symptoms.   Current Outpatient Prescriptions  Medication Sig Dispense Refill  . aspirin 81 MG chewable tablet Chew 81 mg by mouth daily.    . carvedilol (COREG) 3.125 MG tablet Take 1 tablet (3.125 mg total) by mouth 2 (two) times daily with a meal. 180 tablet 3  . cholecalciferol (VITAMIN D) 1000 units tablet Take 1,000 Units by mouth daily.    Marland Kitchen. losartan (COZAAR) 25 MG tablet Take 0.5 tablets (12.5 mg total) by mouth daily. 30 tablet 1  . Multiple Vitamins-Minerals (MULTIVITAMIN WITH MINERALS) tablet Take 1 tablet by mouth 3 (three) times a week.     . nitroGLYCERIN (NITROSTAT) 0.4 MG SL tablet Place 1 tablet (0.4 mg total) under the tongue every 5 (five) minutes as needed for chest pain (up to 3 doses). 25 tablet 3  . oxyCODONE-acetaminophen (ROXICET) 5-325 MG tablet Take 1 tablet by mouth every 6 (six) hours as needed for severe pain. 8 tablet 0  . rosuvastatin (CRESTOR) 10 MG tablet Take 1 tablet (10 mg total) by mouth daily. 90 tablet 3   No current facility-administered medications for this visit.     For VQI Use Only   PRE-ADM LIVING: Home  AMB STATUS: Ambulatory   Physical Examination   Vitals:   11/02/16 1012 11/02/16 1013  BP: 109/62 106/68  Pulse: 63   Resp: 20   Temp: 97.5 F (36.4 C)   TempSrc: Oral   SpO2: 98%   Weight: 125 lb 4.8 oz (56.8 kg)   Height: 5\' 7"  (1.702 m)     left Neck: Incision is healed, some dermabond still on incisions  Neuro: CN 2-12 are intact, Motor strength is 5/5 bilaterally, sensation is grossly intact   Medical Decision Making   [name] PerchesSeyed Sosa is a 61 y.o. male who presents s/p left CEA.   The patient's neck incision is healing with no stroke symptoms. I  discussed in depth with the patient the nature of atherosclerosis, and emphasized the importance of maximal medical management including strict control of blood pressure, blood glucose, and lipid levels, obtaining regular exercise, anti-platelet use and cessation of smoking.   The patient is currently on a statin: Crestor.  The patient is currently on an anti-platelet: ASA. The patient is aware that without maximal medical management the underlying atherosclerotic disease process will progress, limiting the benefit of any interventions. The patient's surveillance will included routine carotid duplex studies which will be completed in: 9 months, at which time the patient will be re-evaluated.   I emphasized the importance of routine surveillance of the carotid arteries as recurrence of stenosis is possible, especially with proper management of underlying atherosclerotic disease. The patient agrees to participate in their maximal medical care and routine surveillance.  Thank you for allowing us to participate in this patient's care.  Leonides SakeBrian Lydia Meng, MD, FACS [facility] Office: [phone] Pager: [phone]

## 2016-10-30 MED FILL — ROSUVASTATIN CALCIUM 10 MG: 10 | 30 days supply | Qty: 30 | Fill #0

## 2016-11-02 ENCOUNTER — Encounter: Payer: Self-pay | Admitting: Vascular Surgery

## 2016-11-02 ENCOUNTER — Ambulatory Visit (INDEPENDENT_AMBULATORY_CARE_PROVIDER_SITE_OTHER): Payer: Self-pay | Admitting: Vascular Surgery

## 2016-11-02 VITALS — BP 106/68 | HR 63 | Temp 97.5°F | Resp 20 | Ht 67.0 in | Wt 125.3 lb

## 2016-11-02 DIAGNOSIS — I779 Disorder of arteries and arterioles, unspecified: Secondary | ICD-10-CM

## 2016-11-02 DIAGNOSIS — I739 Peripheral vascular disease, unspecified: Principal | ICD-10-CM

## 2016-11-07 ENCOUNTER — Ambulatory Visit (HOSPITAL_COMMUNITY): Admission: RE | Admit: 2016-11-07 | Payer: MEDICAID | Source: Ambulatory Visit

## 2016-11-07 NOTE — Addendum Note (Signed)
Addended by: Burton ApleyPETTY, Cheryn Lundquist A on: 11/07/2016 01:27 PM   Modules accepted: Orders

## 2016-11-14 ENCOUNTER — Other Ambulatory Visit: Payer: Self-pay | Admitting: Family Medicine

## 2016-11-14 ENCOUNTER — Ambulatory Visit (HOSPITAL_COMMUNITY): Admission: RE | Admit: 2016-11-14 | Payer: MEDICAID | Source: Ambulatory Visit

## 2016-11-14 DIAGNOSIS — R1903 Right lower quadrant abdominal swelling, mass and lump: Secondary | ICD-10-CM

## 2016-11-18 IMAGING — CT CT HEAD W/O CM
4 series · 17 of 47 positions shown, 19 images · non-contrast
Comparison: 03/17/1999 ill

CLINICAL DATA: Diplopia.

EXAM:
CT HEAD WITHOUT CONTRAST
TECHNIQUE: Contiguous axial images were obtained from the base of the skull
through the vertex without intravenous contrast.

[Series 2: head without · axial · non-contrast · 0.39mm/px · z∈[-62,+58]mm · 7 of 32 slices shown, 9 images]
[im 4/32  brain]
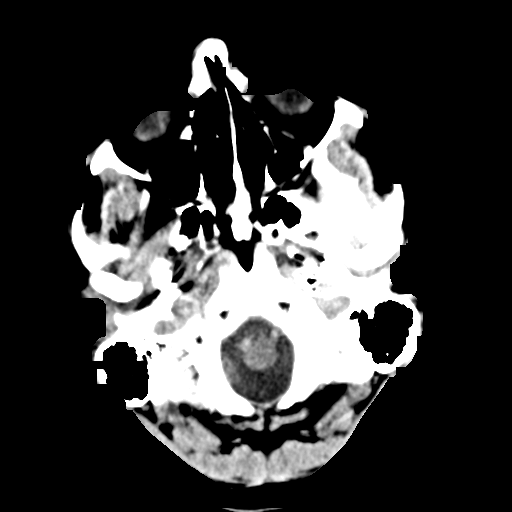
[im 4/32  bone]
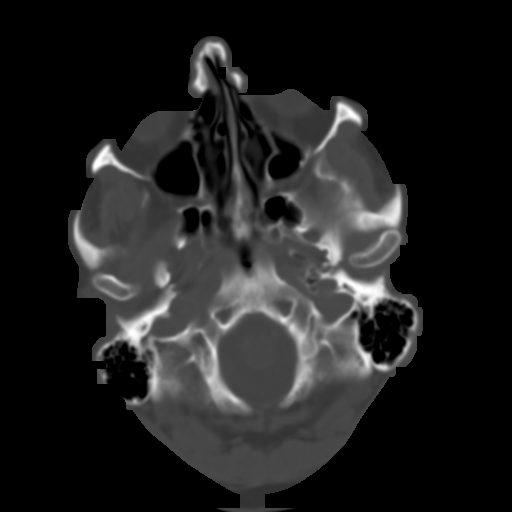
[im 8/32  brain]
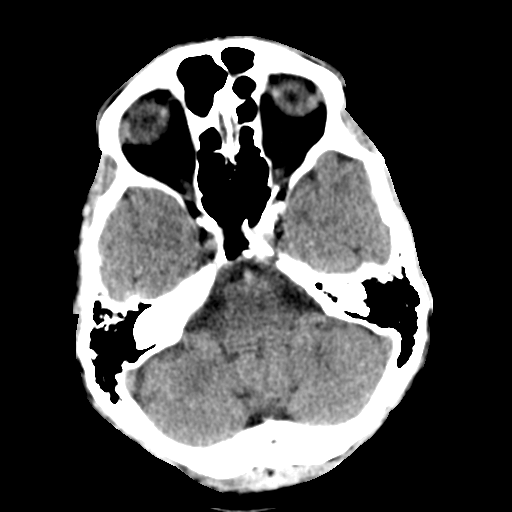
[im 12/32  brain]
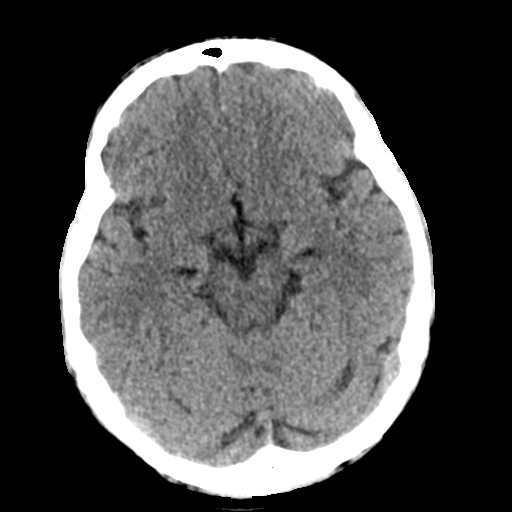
[im 16/32  brain]
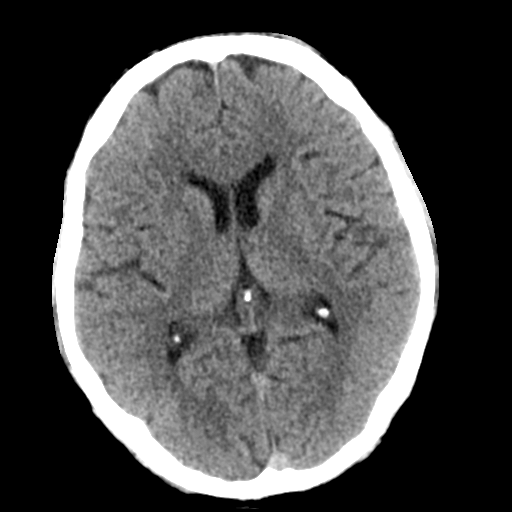
[im 20/32  brain]
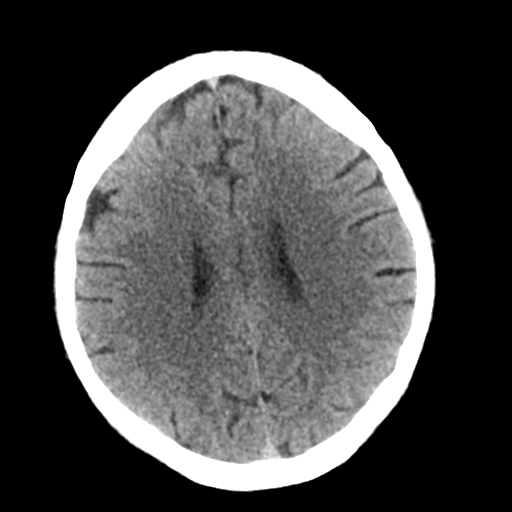
[im 20/32  bone]
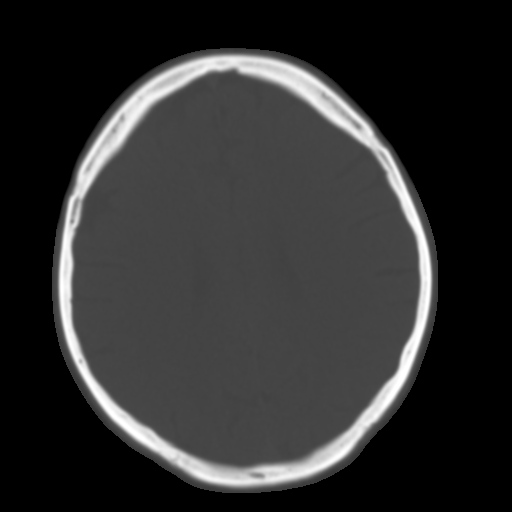
[im 24/32  brain]
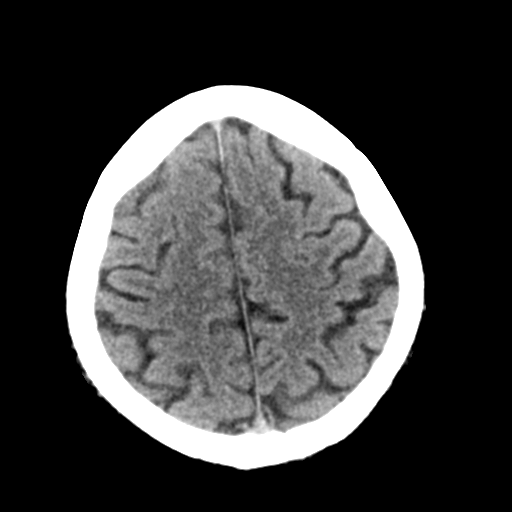
[im 28/32  brain]
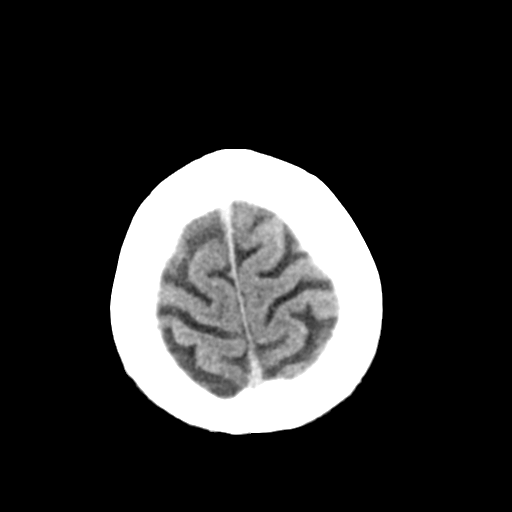

[Series 3: head bone · axial · 0.39mm/px · z∈[-63,-7]mm · 4 of 80 slices shown]
[im 8/80  bone]
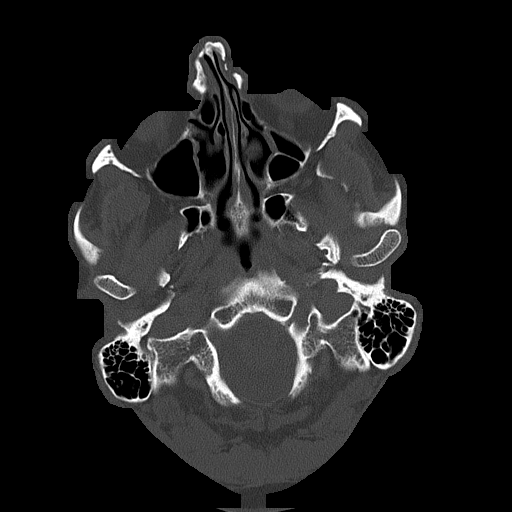
[im 16/80  bone]
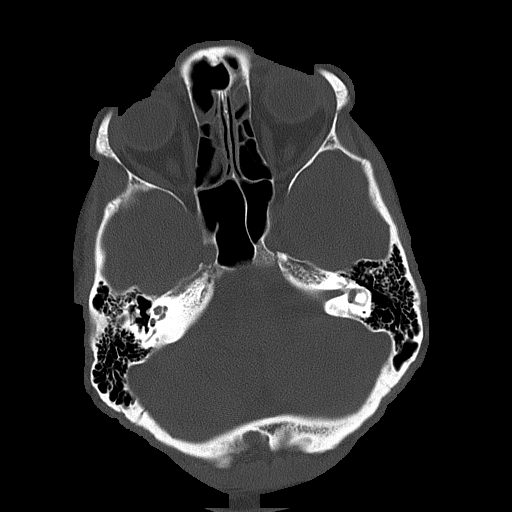
[im 24/80  bone]
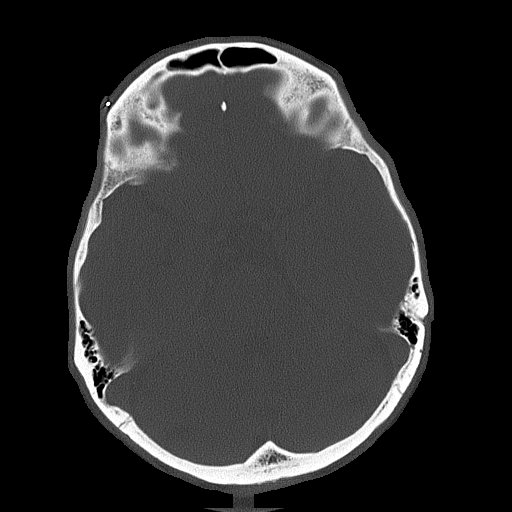
[im 36/80  bone]
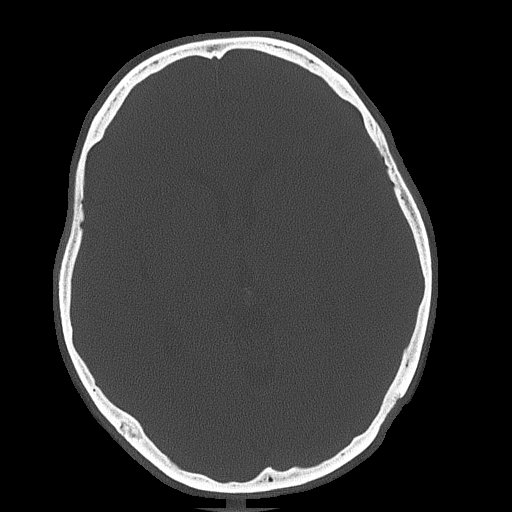

[Series 4: head without cor · coronal · non-contrast · 0.31mm/px · 3 of 67 slices shown]
[im 23/67  brain]
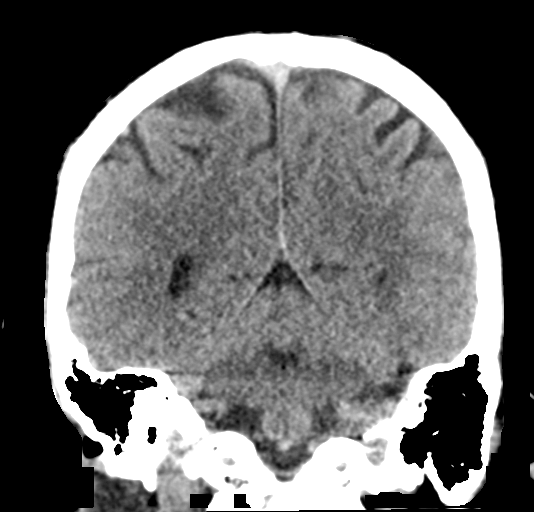
[im 30/67  brain]
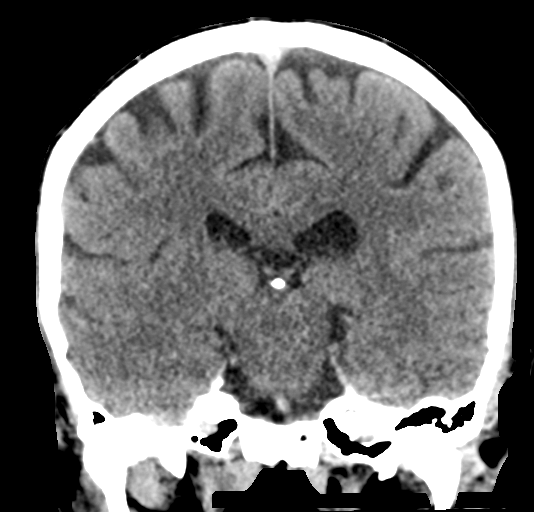
[im 37/67  brain]
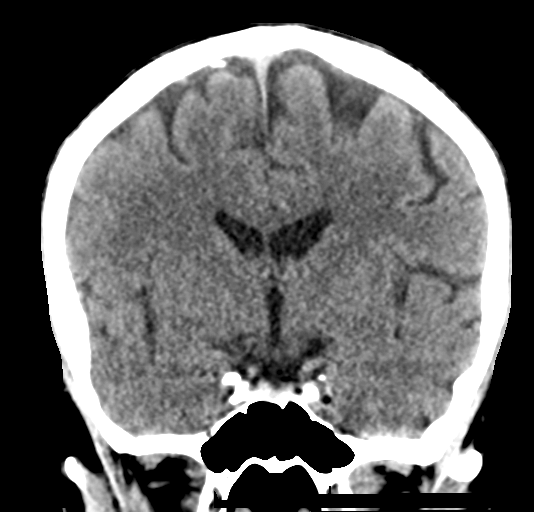

[Series 5: head without sag · sagittal · non-contrast · 0.31mm/px · 3 of 59 slices shown]
[im 20/59  brain]
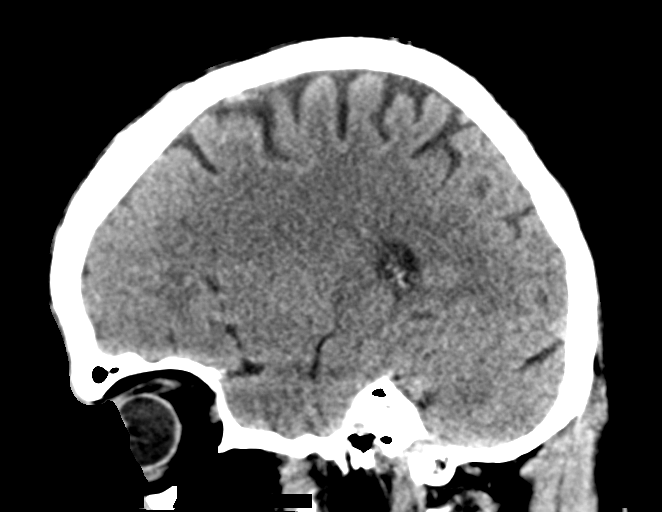
[im 30/59  brain]
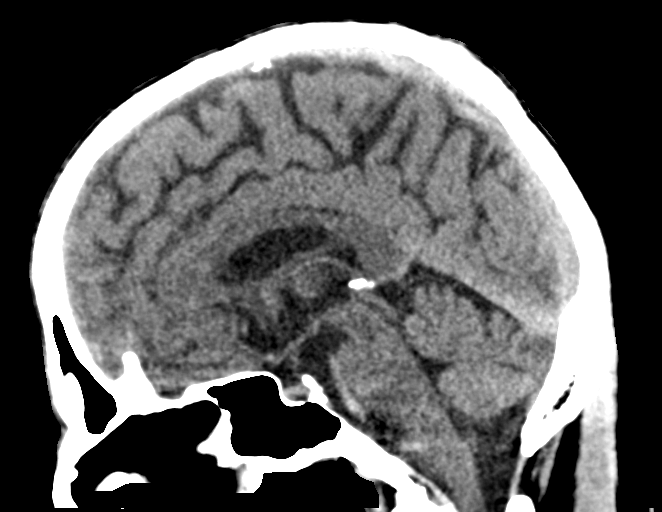
[im 39/59  brain]
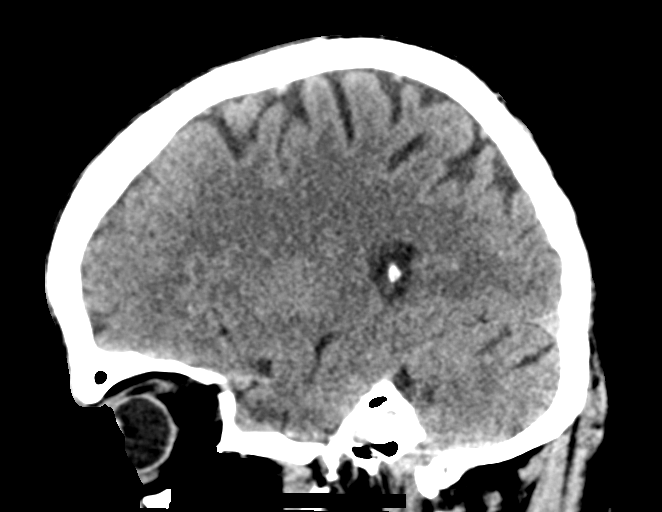

[17 of 47 positions shown; findings below may reference images not displayed]

FINDINGS: Brain: No evidence for acute hemorrhage, mass lesion, midline shift,
hydrocephalus or large infarct.

Vascular: No hyperdense vessel or unexpected calcification.

Skull: Suspect old fracture involving the left nasal bone. No
evidence for a calvarial fracture.

Sinuses/Orbits: Mild sinus disease in the right frontal sinus.
Mucosal thickening in the ethmoid air cells.

Other: None.
IMPRESSION: No acute intracranial abnormality.

Mild sinus disease.

## 2016-11-19 ENCOUNTER — Ambulatory Visit: Payer: BLUE CROSS/BLUE SHIELD | Admitting: Cardiovascular Disease

## 2016-11-19 ENCOUNTER — Ambulatory Visit (HOSPITAL_COMMUNITY): Admission: RE | Admit: 2016-11-19 | Payer: Self-pay | Source: Ambulatory Visit

## 2016-11-19 NOTE — Addendum Note (Signed)
Addendum  created 11/19/16 1438 by Kalisa Girtman, MD   Sign clinical note    

## 2016-11-20 ENCOUNTER — Ambulatory Visit: Payer: Self-pay | Admitting: Family Medicine

## 2016-11-20 VITALS — BP 110/72

## 2016-11-20 DIAGNOSIS — Z0130 Encounter for examination of blood pressure without abnormal findings: Secondary | ICD-10-CM

## 2016-11-21 ENCOUNTER — Other Ambulatory Visit: Payer: Self-pay | Admitting: Family Medicine

## 2016-11-21 MED FILL — LOSARTAN POTASSIUM 25 MG TA: 25 | 60 days supply | Qty: 30 | Fill #1

## 2016-11-29 ENCOUNTER — Encounter: Payer: Self-pay | Admitting: Family Medicine

## 2016-12-03 MED FILL — ROSUVASTATIN CALCIUM 10 MG: 10 | 30 days supply | Qty: 30 | Fill #1

## 2016-12-13 ENCOUNTER — Ambulatory Visit: Payer: BLUE CROSS/BLUE SHIELD | Admitting: Adult Health

## 2016-12-14 ENCOUNTER — Other Ambulatory Visit: Payer: Self-pay | Admitting: Family Medicine

## 2016-12-20 MED FILL — LOSARTAN POTASSIUM 25 MG TA: 25 | 60 days supply | Qty: 30 | Fill #0

## 2016-12-31 MED FILL — ROSUVASTATIN CALCIUM 10 MG: 10 | 30 days supply | Qty: 30 | Fill #2

## 2017-01-15 ENCOUNTER — Telehealth: Payer: Self-pay

## 2017-01-16 NOTE — Telephone Encounter (Signed)
Left a vm for patient to callback 

## 2017-01-16 NOTE — Telephone Encounter (Signed)
Please call patient to advise he will need to come in the office for a blood pressure recheck before I will send in any medication. I would like him to come in today or tomorrow.

## 2017-01-16 NOTE — Telephone Encounter (Signed)
[facility] Patient states that he has been taking 2 pill instead of 1 because his blood pressure had been running in the 160 range.

## 2017-01-18 NOTE — Telephone Encounter (Signed)
Left a vm for patient to callback and schedule appointment 

## 2017-01-24 ENCOUNTER — Ambulatory Visit (INDEPENDENT_AMBULATORY_CARE_PROVIDER_SITE_OTHER): Payer: Medicaid Other | Admitting: Family Medicine

## 2017-01-24 ENCOUNTER — Encounter: Payer: Self-pay | Admitting: Family Medicine

## 2017-01-24 VITALS — BP 129/70 | HR 60 | Temp 97.7°F | Resp 16 | Ht 67.0 in | Wt 128.0 lb

## 2017-01-24 DIAGNOSIS — E7800 Pure hypercholesterolemia, unspecified: Secondary | ICD-10-CM | POA: Diagnosis not present

## 2017-01-24 DIAGNOSIS — I779 Disorder of arteries and arterioles, unspecified: Secondary | ICD-10-CM

## 2017-01-24 DIAGNOSIS — I739 Peripheral vascular disease, unspecified: Secondary | ICD-10-CM

## 2017-01-24 DIAGNOSIS — I10 Essential (primary) hypertension: Secondary | ICD-10-CM

## 2017-01-24 LAB — POCT URINALYSIS DIP (DEVICE)
BILIRUBIN URINE: NEGATIVE
Glucose, UA: NEGATIVE mg/dL
HGB URINE DIPSTICK: NEGATIVE
KETONES UR: NEGATIVE mg/dL
LEUKOCYTES UA: NEGATIVE
NITRITE: NEGATIVE
Protein, ur: NEGATIVE mg/dL
Specific Gravity, Urine: 1.015 (ref 1.005–1.030)
Urobilinogen, UA: 0.2 mg/dL (ref 0.0–1.0)
pH: 7 (ref 5.0–8.0)

## 2017-01-24 LAB — BASIC METABOLIC PANEL WITH GFR
BUN: 17 mg/dL (ref 7–25)
CALCIUM: 9.7 mg/dL (ref 8.6–10.3)
CHLORIDE: 104 mmol/L (ref 98–110)
CO2: 25 mmol/L (ref 20–32)
CREATININE: 0.9 mg/dL (ref 0.70–1.25)
GFR, Est Non African American: >89 mL/min (ref >=60)
Glucose, Bld: 101 mg/dL — ABNORMAL HIGH (ref 65–99)
POTASSIUM: 4.8 mmol/L (ref 3.5–5.3)
Sodium: 140 mmol/L (ref 135–146)

## 2017-01-24 MED ORDER — LOSARTAN POTASSIUM 25 MG PO TABS: 25.0000 mg | ORAL_TABLET | Freq: Every day | ORAL | 1 refills | Status: DC

## 2017-01-24 MED ORDER — LOSARTAN POTASSIUM 50 MG PO TABS: 50.0000 mg | ORAL_TABLET | Freq: Every day | ORAL | 0 refills | Status: DC

## 2017-01-24 NOTE — Patient Instructions (Addendum)
Follow up with orthopedic specialist for left wrist pain and nerve conduction studies  Hypertension: Lorsartan 25 mg daily. Continue to maintain blood pressure journal. Check blood pressures twice daily.   Will follow up by phone with any abnormal laboratory results.   Follow up in 1 month with Joaquin Courts, FNP for medication management and health maintenance    Losartan tablets What is this medicine? LOSARTAN (loe SAR tan) is used to treat high blood pressure and to reduce the risk of stroke in certain patients. This drug also slows the progression of kidney disease in patients with diabetes. This medicine may be used for other purposes; ask your health care provider or pharmacist if you have questions. COMMON BRAND NAME(S): Cozaar What should I tell my health care provider before I take this medicine? They need to know if you have any of these conditions: -heart failure -kidney or liver disease -an unusual or allergic reaction to losartan, other medicines, foods, dyes, or preservatives -pregnant or trying to get pregnant -breast-feeding How should I use this medicine? Take this medicine by mouth with a glass of water. Follow the directions on the prescription label. This medicine can be taken with or without food. Take your doses at regular intervals. Do not take your medicine more often than directed. Talk to your pediatrician regarding the use of this medicine in children. Special care may be needed. Overdosage: If you think you have taken too much of this medicine contact a poison control center or emergency room at once. NOTE: This medicine is only for you. Do not share this medicine with others. What if I miss a dose? If you miss a dose, take it as soon as you can. If it is almost time for your next dose, take only that dose. Do not take double or extra doses. What may interact with this medicine? -blood pressure medicines -diuretics, especially triamterene, spironolactone,  or amiloride -fluconazole -NSAIDs, medicines for pain and inflammation, like ibuprofen or naproxen -potassium salts or potassium supplements -rifampin This list may not describe all possible interactions. Give your health care provider a list of all the medicines, herbs, non-prescription drugs, or dietary supplements you use. Also tell them if you smoke, drink alcohol, or use illegal drugs. Some items may interact with your medicine. What should I watch for while using this medicine? Visit your doctor or health care professional for regular checks on your progress. Check your blood pressure as directed. Ask your doctor or health care professional what your blood pressure should be and when you should contact him or her. Call your doctor or health care professional if you notice an irregular or fast heart beat. Women should inform their doctor if they wish to become pregnant or think they might be pregnant. There is a potential for serious side effects to an unborn child, particularly in the second or third trimester. Talk to your health care professional or pharmacist for more information. You may get drowsy or dizzy. Do not drive, use machinery, or do anything that needs mental alertness until you know how this drug affects you. Do not stand or sit up quickly, especially if you are an older patient. This reduces the risk of dizzy or fainting spells. Alcohol can make you more drowsy and dizzy. Avoid alcoholic drinks. Avoid salt substitutes unless you are told otherwise by your doctor or health care professional. Do not treat yourself for coughs, colds, or pain while you are taking this medicine without asking your doctor or health  care professional for advice. Some ingredients may increase your blood pressure. What side effects may I notice from receiving this medicine? Side effects that you should report to your doctor or health care professional as soon as possible: -confusion, dizziness, light  headedness or fainting spells -decreased amount of urine passed -difficulty breathing or swallowing, hoarseness, or tightening of the throat -fast or irregular heart beat, palpitations, or chest pain -skin rash, itching -swelling of your face, lips, tongue, hands, or feet Side effects that usually do not require medical attention (report to your doctor or health care professional if they continue or are bothersome): -cough -decreased sexual function or desire -headache -nasal congestion or stuffiness -nausea or stomach pain -sore or cramping muscles This list may not describe all possible side effects. Call your doctor for medical advice about side effects. You may report side effects to FDA at 1-800-FDA-1088. Where should I keep my medicine? Keep out of the reach of children. Store at room temperature between 15 and 30 degrees C (59 and 86 degrees F). Protect from light. Keep container tightly closed. Throw away any unused medicine after the expiration date. NOTE: This sheet is a summary. It may not cover all possible information. If you have questions about this medicine, talk to your doctor, pharmacist, or health care provider.  2018 Elsevier/Gold Standard (2007-08-15 16:42:18)

## 2017-01-24 NOTE — Progress Notes (Signed)
Mr. Gerhard PerchesSeyed Cashin, a 61 year old male with a history of TIA, fourth cranial nerve palsy, and hypertension presents for a 3 month follow up of chronic conditions.     Patient is s/p left carotid endarectomy. He was previously admitted to inpatient services on 10/16/2016 with recurrent TIA. Past carotid studies demonstrated significant stenosis. Patient underwent a left carotic endarterectomy with  angioplasty. He has been doing well since surgery with minimal complaints. His most recent follow up with Dr. Leonides SakeBrian Chen, vascular surgeon,  was on 11/02/2016. Mr. Dorena DewHosseini is scheduled for carotic duplex studies in 9 months.    Patient here for follow up of hypertension. Mr. Dorena DewHosseini  had a chronic cough on lisinopril, medication was changed to Losartan 12.5 mg during previous appointment. He had been taking medication consistently prior to running out.  He says that the pharmacy found that he had been taking medication incorrectly. He was taking Losartan 25 mg and was prescribed 12.5 mg daily. After medications ran out, he reverted back to Lisinopril. He has been maintaining a blood pressure journal over the past 3 months. He typically checks blood pressure twice daily. He says that he has not been following a lowfat, low sodium diet and does not exercise routinely.  Patient denies chest pain, claudication, dyspnea, fatigue, irregular heart beat, lower extremity edema, orthopnea, palpitations, syncope and tachypnea.  Cardiovascular risk factors include: dyslipidemia.   Past Medical History:  Diagnosis Date  . CAD (coronary artery disease)    [facility]  . Carotid artery occlusion   . High cholesterol   . Hyperlipidemia   . Hypertension   . Pre-diabetes    < 6.1 2017  . Stroke [facility](HCC) 04/2016   TIA- 04/2016 double vision 2nd - speech - no residual  . 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 Marital status: Single    Spouse name: N/A  . Number of  children: 3  . Years of education: AS + 2   Occupational History  . [employer]    Social History Main Topics  . Smoking status: Former Smoker    Years: 2.00    Quit date: 06/19/1998  . Smokeless tobacco: Never Used  . Alcohol use No  . Drug use: No  . Sexual activity: Yes   Other Topics Concern  . Not on file   Social History Narrative   Lives at home alone   Right-handed   Caffeine: 2 cups per day   Immunization History  Administered Date(s) Administered  . Tdap 06/18/2008  .` Allergies  Allergen Reactions  . Lisinopril Cough  Review of Systems  Constitutional: Negative.  Negative for chills, fever and weight loss.  HENT: Negative.  Negative for hearing loss and tinnitus.   Eyes: Negative.   Respiratory: Negative.   Cardiovascular: Negative.  Negative for chest pain, palpitations, orthopnea, claudication, leg swelling and PND.  Gastrointestinal: Negative.   Genitourinary: Negative.   Musculoskeletal: Negative.   Skin: Negative.   Neurological: Positive for tingling (left hand, occasionally).  Endo/Heme/Allergies: Negative.   Psychiatric/Behavioral: Negative.    Physical Exam  Constitutional: He is well-developed, well-nourished, and in no distress.  HENT:  Head: Normocephalic and atraumatic.  Right Ear: External ear normal.  Left Ear: External ear normal.  Nose: Nose normal.  Mouth/Throat: Oropharynx is clear and moist.  Eyes: Pupils are equal, round, and reactive to light.  Neck: Normal range of motion. Neck supple.  Cardiovascular: Normal rate,  regular rhythm, normal heart sounds and intact distal pulses.   Pulses:      Carotid pulses are 2+ on the right side, and 2+ on the left side.      Radial pulses are 2+ on the right side, and 2+ on the left side.  Pulmonary/Chest: Effort normal and breath sounds normal.  Abdominal: Soft. Bowel sounds are normal.   Plan   BP 129/70 (BP Location: Left Arm, Patient Position: Sitting, Cuff Size: Normal)   Pulse 60    Temp 97.7 F (36.5 C) (Oral)   Resp 16   Ht 5\' 7"  (1.702 m)   Wt 128 lb (58.1 kg)   SpO2 99%   BMI 20.05 kg/m   1. HYPERTENSION, BENIGN Reviewed blood pressure diary. Patient was taking Losartan incorrectly. He was taking 25 mg and was prescribed 12.5 mg daily. In reviewing diary, blood pressure was at goal on 25 mg. Will increase medication. Recommend that patient refrain from taking Lisinopril due to listed allergy.  I have discussed with him the great importance of following the treatment plan exactly as directed in order to achieve a good medical outcome. Reviewed urinalysis, no proteinuria or hematuria present.  Will review renal functioning as results become available.   - losartan (COZAAR) 25 MG tablet; Take 1 tablet (25 mg total) by mouth daily.  Dispense: 90 tablet; Refill: 1 - POCT urinalysis dip (device) - BASIC METABOLIC PANEL WITH GFR  2. Bilateral carotid artery disease (HCC) Follow up with Dr. Leonides Sake, Vein and vascular specialist as scheduled.   3. Pure hypercholesterolemia Continue statin and ASA therapy as previously prescribed.  The 10-year ASCVD risk score Denman George DC Montez Hageman., et al., 2013) is: 8.4%   Values used to calculate the score:     Age: 61 years     Sex: Male     Is Non-Hispanic African American: No     Diabetic: No     Tobacco smoker: No     Systolic Blood Pressure: 129 mmHg     Is BP treated: Yes     HDL Cholesterol: 38 mg/dL     Total Cholesterol: 131 mg/dL    RTC: Follow up with Joaquin Courts, FNP in 1 month to review blood pressure diary and medication management   Nolon Nations  MSN, FNP-C [facility] Patient [facility] [address] [place], [place] [address] [phone]

## 2017-01-25 MED FILL — LOSARTAN POTASSIUM 25 MG TA: 25 | 30 days supply | Qty: 30 | Fill #0

## 2017-01-30 MED FILL — ROSUVASTATIN CALCIUM 10 MG: 10 | 30 days supply | Qty: 30 | Fill #3

## 2017-03-04 MED FILL — ROSUVASTATIN CALCIUM 10 MG: 10 | 30 days supply | Qty: 30 | Fill #4

## 2017-03-29 MED FILL — ROSUVASTATIN CALCIUM 10 MG: 10 | 30 days supply | Qty: 30 | Fill #5

## 2017-04-17 ENCOUNTER — Ambulatory Visit (INDEPENDENT_AMBULATORY_CARE_PROVIDER_SITE_OTHER): Payer: Medicaid Other | Admitting: Cardiovascular Disease

## 2017-04-17 VITALS — BP 112/70 | HR 67 | Ht 67.0 in | Wt 125.8 lb

## 2017-04-17 DIAGNOSIS — I10 Essential (primary) hypertension: Secondary | ICD-10-CM

## 2017-04-17 DIAGNOSIS — I25709 Atherosclerosis of coronary artery bypass graft(s), unspecified, with unspecified angina pectoris: Secondary | ICD-10-CM

## 2017-04-17 DIAGNOSIS — E7800 Pure hypercholesterolemia, unspecified: Secondary | ICD-10-CM

## 2017-04-17 MED ORDER — LISINOPRIL 20 MG PO TABS: 20.0000 mg | ORAL_TABLET | Freq: Every day | ORAL | 3 refills | Status: DC

## 2017-04-17 NOTE — Progress Notes (Signed)
Chief Complaint  Patient presents with  . Follow-up    CAD    History of Present Illness: 61 yo male with h/o CAD s/p 7V CABG in 2000 at [facility], HTN, hyperlipidemia, carotid artery disease and prior TIA here today for cardiac follow up. He was seen as a new pt in January 2011 with complaints of chest pain. He had been followed from 2000 until 2011 by Dr. [name] in [place]. He has had frequent episodes of chest pain since I met him in 2011. Cardiac cath in 2011 with 5/7 patent bypass grafts. Stress myoview in August 2015 with no ischemia, LVEF=48%. Last cardiac cath October 2017 with patent LIMA to LAD, patent SVG to OM which filled all of the Circumflex and occluded RCA with occluded SVG to RCA. The RCA filled from left to right collaterals. He underwent left carotid endarterectomy in May 2018 following a TIA. His carotid disease is followed in [facility].    He is here today for follow up. The patient denies any dyspnea, palpitations, lower extremity edema, orthopnea, PND, dizziness, near syncope or syncope. One episode of chest pain last week that lasted for one minute and resolved without intervention.   Primary Care Physician: Scot Jun, FNP  Past Medical History:  Diagnosis Date  . CAD (coronary artery disease)    s/p 7 vessel CABG 2000 at [facility]  . Carotid artery occlusion   . High cholesterol   . Hyperlipidemia   . Hypertension   . Pre-diabetes    < 6.1 2017  . Stroke ([place]) 04/2016   TIA- 04/2016 double vision 2nd - speech - no residual  . 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 4v cabg    . CARDIAC CATHETERIZATION    . CARDIAC CATHETERIZATION N/A 03/23/2016   Procedure: Left Heart Cath and Cors/Grafts Angiography;  Surgeon: Burnell Blanks, MD;  Location: [place] [facility];  Service: Cardiovascular;  Laterality: N/A;  . CAROTID ANGIOGRAPHY N/A 10/11/2016   Procedure: Carotid  Angiography / Cerebral angiogram;  Surgeon: Conrad Santa Ana, MD;  Location: [place] [facility];  Service: Cardiovascular;  Laterality: N/A;  . CAROTID ENDARTERECTOMY    . COLONOSCOPY  2017  . COLONOSCOPY W/ POLYPECTOMY    . CORONARY ARTERY BYPASS GRAFT    . ENDARTERECTOMY Left 10/16/2016   Procedure: LEFT CAROTID ENDARTERECTOMY WITH PATCH ANGIOPLASTY;  Surgeon: Conrad Kenmar, MD;  Location: [place];  Service: Vascular;  Laterality: Left;  . patent grafts    . s/p 7    . UPPER GASTROINTESTINAL ENDOSCOPY      Current Outpatient Prescriptions  Medication Sig Dispense Refill  . aspirin 81 MG chewable tablet Chew 81 mg by mouth daily.    . carvedilol (COREG) 3.125 MG tablet Take 1 tablet (3.125 mg total) by mouth 2 (two) times daily with a meal. 180 tablet 3  . lisinopril (PRINIVIL,ZESTRIL) 20 MG tablet Take 1 tablet (20 mg total) by mouth daily. 90 tablet 3  . nitroGLYCERIN (NITROSTAT) 0.4 MG SL tablet Place 1 tablet (0.4 mg total) under the tongue every 5 (five) minutes as needed for chest pain (up to 3 doses). 25 tablet 3  . rosuvastatin (CRESTOR) 10 MG tablet Take 1 tablet (10 mg total) by mouth daily. 90 tablet 3   No current facility-administered medications for this visit.     Allergies  Allergen Reactions  . Lisinopril Cough  Social History   Social History  . Marital status: Single    Spouse name: N/A  . Number of children: 3  . Years of education: AS + 2   Occupational History  . [employer]    Social History Main Topics  . Smoking status: Former Smoker    Years: 2.00    Quit date: 06/19/1998  . Smokeless tobacco: Never Used  . Alcohol use No  . Drug use: No  . Sexual activity: Yes   Other Topics Concern  . Not on file   Social History Narrative   Lives at home alone   Right-handed   Caffeine: 2 cups per day    Family History  Problem Relation Age of Onset  . Heart failure Mother   . Hypertension Mother   . Heart disease Mother   . Heart failure Father   .  Hypertension Father   . Heart disease Father        before age 72  . Hypertension Sister   . Hypertension Brother   . Heart attack Neg Hx   . Stroke Neg Hx     Review of Systems:  As stated in the HPI and otherwise negative.   BP 112/70   Pulse 67   Ht _0  (1.702 m)   Wt 125 lb 12.8 oz (57.1 kg)   SpO2 96%   BMI 19.70 kg/m   Physical Examination:  General: Well developed, well nourished, NAD  HEENT: OP clear, mucus membranes moist  SKIN: warm, dry. No rashes. Neuro: No focal deficits  Musculoskeletal: Muscle strength 5/5 all ext  Psychiatric: Mood and affect normal  Neck: No JVD, no carotid bruits, no thyromegaly, no lymphadenopathy.  Lungs:Clear bilaterally, no wheezes, rhonci, crackles Cardiovascular: Regular rate and rhythm. No murmurs, gallops or rubs. Abdomen:Soft. Bowel sounds present. Non-tender.  Extremities: No lower extremity edema. Pulses are 2 + in the bilateral DP/PT.  Cardiac cath 03/23/16: 1. Triple vessel CAD s/p 5V CABG with 2/5 patent bypass grafts 2. LAD is occluded mid segment. LIMA graft is patent to the LAD 3. Circumflex is occluded proximally. The free RIMA graft to the first OM is patent and fills the entire Circumflex including all marginal branches.  4. The RCA is occluded proximally. The vein graft to the RCA is now occluded (new since last cath). The distal RCA fills from left to right collaterals.  5. Both SVG grafts to Diagonal 1 and Diagonal 2 are known to be occluded 6. Moderate systolic LV dysfunction, IOXB=35-32%.   EKG:  EKG is not ordered today.  Recent Labs: 10/24/2016: ALT 18; Hemoglobin 12.3; Platelets 357; TSH 0.57 01/24/2017: BUN 17; Creat 0.90; Potassium 4.8; Sodium 140   Lipid Panel    Component Value Date/Time   CHOL 131 10/24/2016 1009   CHOL 117 08/09/2016 1040   TRIG 114 10/24/2016 1009   HDL 38 (L) 10/24/2016 1009   HDL 58 08/09/2016 1040   CHOLHDL 3.4 10/24/2016 1009   VLDL 23 10/24/2016 1009   LDLCALC 70  10/24/2016 1009   LDLCALC 52 08/09/2016 1040     Wt Readings from Last 3 Encounters:  04/17/17 125 lb 12.8 oz (57.1 kg)  01/24/17 128 lb (58.1 kg)  11/02/16 125 lb 4.8 oz (56.8 kg)     Other studies Reviewed: Additional studies/ records that were reviewed today include: . Review of the above records demonstrates:    Assessment and Plan:   1. CAD s/p CABG with chronic stable angina: No change  in chest pains. Last cardiac cath in October 2017 with stable CAD. Will continue ASA, statin, beta blocker.  2. HTN: BP is well controlled. No changes. He is back on Lisinopril and does not think it causes a cough.   3. HLD: Lipids are controlled. No changes.   4. Carotid artery disease: s/p left CEA in 2018. Followed in [facility]  Current medicines are reviewed at length with the patient today.  The patient does not have concerns regarding medicines.  The following changes have been made:  no change  Labs/ tests ordered today include:   No orders of the defined types were placed in this encounter.   Disposition:   FU with me in 6  months  Signed, [name], MD 04/17/2017 10:06 AM    [facility] [place], [place], [place]  [address] Phone: [phone]; Fax: [phone]

## 2017-04-17 NOTE — Patient Instructions (Signed)
Medication Instructions:  Your physician recommends that you continue on your current medications as directed. Please refer to the Current Medication list given to you today.  Labwork: No new orders.   Testing/Procedures: No new orders.   Follow-Up: Your physician wants you to follow-up in: 6 MONTHS with Dr McAlhany.  You will receive a reminder letter in the mail two months in advance. If you don't receive a letter, please call our office to schedule the follow-up appointment.   Any Other Special Instructions Will Be Listed Below (If Applicable).     If you need a refill on your cardiac medications before your next appointment, please call your pharmacy.   

## 2017-04-26 ENCOUNTER — Ambulatory Visit: Payer: Medicaid Other | Admitting: Family Medicine

## 2017-05-02 MED FILL — ROSUVASTATIN CALCIUM 10 MG: 10 | 30 days supply | Qty: 30 | Fill #6

## 2017-05-17 ENCOUNTER — Ambulatory Visit: Payer: Medicaid Other | Admitting: Family Medicine

## 2017-05-31 ENCOUNTER — Ambulatory Visit: Payer: Medicaid Other | Admitting: Family Medicine

## 2017-06-03 MED FILL — ROSUVASTATIN CALCIUM 10 MG: 10 | 30 days supply | Qty: 30 | Fill #7

## 2017-06-24 ENCOUNTER — Ambulatory Visit: Payer: Medicaid Other | Admitting: Family Medicine

## 2017-06-24 ENCOUNTER — Ambulatory Visit (HOSPITAL_COMMUNITY)
Admission: RE | Admit: 2017-06-24 | Discharge: 2017-06-24 | Disposition: A | Discharge status: Home / Self Care | Payer: Medicaid Other | Source: Ambulatory Visit | Attending: Family Medicine | Admitting: Family Medicine

## 2017-06-24 ENCOUNTER — Encounter: Payer: Self-pay | Admitting: Family Medicine

## 2017-06-24 VITALS — BP 108/74 | HR 64 | Temp 98.6°F | Resp 12 | Ht 67.0 in | Wt 129.0 lb

## 2017-06-24 DIAGNOSIS — M5441 Lumbago with sciatica, right side: Secondary | ICD-10-CM | POA: Diagnosis not present

## 2017-06-24 DIAGNOSIS — I10 Essential (primary) hypertension: Secondary | ICD-10-CM

## 2017-06-24 DIAGNOSIS — M5137 Other intervertebral disc degeneration, lumbosacral region: Secondary | ICD-10-CM | POA: Insufficient documentation

## 2017-06-24 DIAGNOSIS — M5442 Lumbago with sciatica, left side: Secondary | ICD-10-CM

## 2017-06-24 LAB — POCT URINALYSIS DIP (DEVICE)
BILIRUBIN URINE: NEGATIVE
Glucose, UA: NEGATIVE mg/dL
Hgb urine dipstick: NEGATIVE
Ketones, ur: NEGATIVE mg/dL
LEUKOCYTES UA: NEGATIVE
NITRITE: NEGATIVE
Protein, ur: NEGATIVE mg/dL
SPECIFIC GRAVITY, URINE: 1.025 (ref 1.005–1.030)
Urobilinogen, UA: 0.2 mg/dL (ref 0.0–1.0)
pH: 5.5 (ref 5.0–8.0)

## 2017-06-24 MED ORDER — PREDNISONE 20 MG PO TABS
ORAL_TABLET | ORAL | 0 refills | Status: DC
Start: 2017-06-24 — End: 2017-11-20

## 2017-06-24 NOTE — Progress Notes (Signed)
Patient ID: [name] Sosa, male    DOB: 24-Jun-1955, 62 y.o.   MRN: [id number]  PCP: [name] Neighbors, FNP  Chief Complaint  Patient presents with  . Follow-up    htn  . Back Pain    Subjective:  HPI [name] Sosa is a 62 y.o. male presents for evaluation of hypertension and new onset back pain.  Medical problems include: CAD w/artery graft bypass, Hypertension, Angina,  Hyperlipidemia.  Back Pain Onset approximately 2 week ago. Reports no injury. Location is low back right side initially and now on left side radiating into lower buttocks and thigh. No weakness in bilateral legs. Pain is exacerbated with changes in position from lying/sitting to standing. Tylenol and ibuprofen helped minimally and temporarily.  Hypertension  [name] Sosa reports home monitoring of blood pressure with readings mostly consistently <130/90. Reports adherence to blood pressure medications. Reports efforts to adhere to low sodium diet. He is a former nonsmoker. He is inactive at present due to back pain. Denies any episodes of dizziness,headaches, shortness of breath, or chest pain.  Social History   Socioeconomic History  . Marital status: Single    Spouse name: Not on file  . Number of children: 3  . Years of education: AS + 2  . Highest education level: Not on file  Social Needs  . Financial resource strain: Not on file  . Food insecurity - worry: Not on file  . Food insecurity - inability: Not on file  . Transportation needs - medical: Not on file  . Transportation needs - non-medical: Not on file  Occupational History  . Occupation: [employer]  Tobacco Use  . Smoking status: Former Smoker    Years: 2.00    Last attempt to quit: 06/19/1998    Years since quitting: 19.0  . Smokeless tobacco: Never Used  Substance and Sexual Activity  . Alcohol use: No  . Drug use: No  . Sexual activity: Yes  Other Topics Concern  . Not on file  Social History Narrative   Lives at home alone   Right-handed   Caffeine: 2 cups per day   Family History  Problem Relation Age of Onset  . Heart failure Mother   . Hypertension Mother   . Heart disease Mother   . Heart failure Father   . Hypertension Father   . Heart disease Father        before age 66  . Hypertension Sister   . Hypertension Brother   . Heart attack Neg Hx   . Stroke Neg Hx    Review of Systems  Constitutional: Negative.   HENT: Negative.   Respiratory: Negative.   Cardiovascular: Negative.   Gastrointestinal: Negative.   Musculoskeletal: Positive for back pain.  Neurological: Negative.   Psychiatric/Behavioral: Negative.    Patient Active Problem List   Diagnosis Date Noted  . Left carotid artery stenosis 10/16/2016  . Carotid artery disease (HCC) 10/05/2016  . Diplopia 05/03/2016  . 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 Unstable angina (HCC) 03/16/2016  . Coronary artery disease involving native coronary artery of native heart with unstable angina pectoris (HCC) 03/28/2011  . Hyperlipidemia 07/12/2009  . HYPERTENSION, BENIGN 07/12/2009  . CAD, ARTERY BYPASS GRAFT 07/12/2009  . CHEST PAIN-UNSPECIFIED 06/30/2009    Allergies  Allergen Reactions  . Lisinopril Cough    Prior to Admission medications   Medication Sig Start Date End Date Taking? Authorizing Provider  aspirin 81 MG chewable tablet Chew 81 mg by mouth daily.  Yes [provider]  carvedilol (COREG) 3.125 MG tablet Take 1 tablet (3.125 mg total) by mouth 2 (two) times daily with a meal. 04/20/16  Yes Kathleene HazelMcAlhany, Christopher D, MD  lisinopril (PRINIVIL,ZESTRIL) 20 MG tablet Take 1 tablet (20 mg total) by mouth daily. 04/17/17  Yes Kathleene HazelMcAlhany, Christopher D, MD  nitroGLYCERIN (NITROSTAT) 0.4 MG SL tablet Place 1 tablet (0.4 mg total) under the tongue every 5 (five) minutes as needed for chest pain (up to 3 doses). 04/20/16  Yes Kathleene HazelMcAlhany, Christopher D, MD  rosuvastatin (CRESTOR) 10 MG tablet Take 1 tablet (10 mg total) by mouth daily. 10/29/16   Yes [name] NeighborsHarris, Omie Ferger S, FNP    Past Medical, Surgical Family and Social History reviewed and updated.    Objective:   Today's Vitals   06/24/17 0817  BP: 108/74  Pulse: 64  Resp: 12  Temp: 98.6 F (37 C)  TempSrc: Oral  SpO2: 100%  Weight: 129 lb (58.5 kg)  Height: 5\' 7"  (1.702 m)    Wt Readings from Last 3 Encounters:  06/24/17 129 lb (58.5 kg)  04/17/17 125 lb 12.8 oz (57.1 kg)  01/24/17 128 lb (58.1 kg)   Physical Exam  Constitutional: He is oriented to person, place, and time. He appears well-developed and well-nourished.  HENT:  Head: Normocephalic and atraumatic.  Eyes: Conjunctivae are normal. Pupils are equal, round, and reactive to light.  Neck: Normal range of motion. Neck supple. No thyromegaly present.  Cardiovascular: Normal rate, regular rhythm, normal heart sounds and intact distal pulses.  Pulmonary/Chest: Effort normal and breath sounds normal.  Musculoskeletal:       Lumbar back: He exhibits decreased range of motion, tenderness, edema and pain. He exhibits no bony tenderness and no swelling.  Lymphadenopathy:    He has no cervical adenopathy.  Neurological: He is alert and oriented to person, place, and time.  Skin: Skin is warm and dry.  Psychiatric: He has a normal mood and affect. His behavior is normal. Judgment and thought content normal.   Assessment & Plan:  1. Essential hypertension, stable/well controlled. We have discussed target BP range and blood pressure goal. I have advised patient to check BP regularly and to call us back or report to clinic if the numbers are consistently higher than 140/90. We discussed the importance of compliance with medical therapy and DASH diet recommended, consequences of uncontrolled hypertension discussed. Checking a CMP today.  2. Acute bilateral low back pain with bilateral sciatica, Acute-worsening. Will obtain lumbar spine image to rule out degenerative causes.Will trial a prednisone taper to attempt to  relieve inflammation. If no improvement will refer to orthopedic for further evaluation.     Meds ordered this encounter  Medications  . predniSONE (DELTASONE) 20 MG tablet    Sig: Take 3 PO QAM x3days, 2 PO QAM x3days, 1 PO QAM x3days    Dispense:  18 tablet    Refill:  0    Order Specific Question:   Supervising Provider    Answer:   Quentin AngstJEGEDE, OLUGBEMIGA E [[id number]]    Orders Placed This Encounter  Procedures  . DG Lumbar Spine Complete  . Comprehensive metabolic panel  . POCT urinalysis dip (device)    RTC: 2 weeks for back pain with sciatica follow-up    Godfrey PickKimberly S. Tiburcio PeaHarris, MSN, FNP-C [facility]  [address] [place], [place] [address] [phone]

## 2017-06-24 NOTE — Patient Instructions (Addendum)
Take Prednisone 20 mg,  in mornings with breakfast as follows:  Take 3 pills for 3 days, Take 2 pills for 3 days, and Take 1 pill for 3 days.  Complete all medication.  Go to the first floor to obtain your back x-ray and I will follow-up with you by phone with you results.  Blood pressure is very well controlled.  Continue current medication and diet regimen.     Back Pain, Adult Back pain is very common. The pain often gets better over time. The cause of back pain is usually not dangerous. Most people can learn to manage their back pain on their own. Follow these instructions at home: Watch your back pain for any changes. The following actions may help to lessen any pain you are feeling:  Stay active. Start with short walks on flat ground if you can. Try to walk farther each day.  Exercise regularly as told by your doctor. Exercise helps your back heal faster. It also helps avoid future injury by keeping your muscles strong and flexible.  Do not sit, drive, or stand in one place for more than 30 minutes.  Do not stay in bed. Resting more than 1-2 days can slow down your recovery.  Be careful when you bend or lift an object. Use good form when lifting: ? Bend at your knees. ? Keep the object close to your body. ? Do not twist.  Sleep on a firm mattress. Lie on your side, and bend your knees. If you lie on your back, put a pillow under your knees.  Take medicines only as told by your doctor.  Put ice on the injured area. ? Put ice in a plastic bag. ? Place a towel between your skin and the bag. ? Leave the ice on for 20 minutes, 2-3 times a day for the first 2-3 days. After that, you can switch between ice and heat packs.  Avoid feeling anxious or stressed. Find good ways to deal with stress, such as exercise.  Maintain a healthy weight. Extra weight puts stress on your back.  Contact a doctor if:  You have pain that does not go away with rest or medicine.  You have  worsening pain that goes down into your legs or buttocks.  You have pain that does not get better in one week.  You have pain at night.  You lose weight.  You have a fever or chills. Get help right away if:  You cannot control when you poop (bowel movement) or pee (urinate).  Your arms or legs feel weak.  Your arms or legs lose feeling (numbness).  You feel sick to your stomach (nauseous) or throw up (vomit).  You have belly (abdominal) pain.  You feel like you may pass out (faint). This information is not intended to replace advice given to you by your health care provider. Make sure you discuss any questions you have with your health care provider. Document Released: 11/21/2007 Document Revised: 11/10/2015 Document Reviewed: 10/06/2013 Elsevier Interactive Patient Education  [employer]2018 [employer] Inc[employer]

## 2017-06-25 ENCOUNTER — Encounter: Payer: Self-pay | Admitting: Family Medicine

## 2017-06-25 ENCOUNTER — Telehealth: Payer: Self-pay | Admitting: Family Medicine

## 2017-06-25 LAB — COMPREHENSIVE METABOLIC PANEL
ALK PHOS: 53 IU/L (ref 39–117)
ALT: 16 IU/L (ref 0–44)
AST: 24 IU/L (ref 0–40)
Albumin/Globulin Ratio: 1.8 (ref 1.2–2.2)
Albumin: 4.5 g/dL (ref 3.6–4.8)
BUN / CREAT RATIO: 20 (ref 10–24)
BUN: 16 mg/dL (ref 8–27)
Bilirubin Total: 0.3 mg/dL (ref 0.0–1.2)
CO2: 22 mmol/L (ref 20–29)
Calcium: 9.4 mg/dL (ref 8.6–10.2)
Chloride: 103 mmol/L (ref 96–106)
Creatinine, Ser: 0.82 mg/dL (ref 0.76–1.27)
GFR calc Af Amer: 111 mL/min/{1.73_m2} (ref >59)
GFR, EST NON AFRICAN AMERICAN: 96 mL/min/{1.73_m2} (ref >59)
GLOBULIN, TOTAL: 2.5 g/dL (ref 1.5–4.5)
Glucose: 90 mg/dL (ref 65–99)
Potassium: 4.5 mmol/L (ref 3.5–5.2)
SODIUM: 141 mmol/L (ref 134–144)
Total Protein: 7 g/dL (ref 6.0–8.5)

## 2017-06-25 NOTE — Telephone Encounter (Signed)
Contact patient to advise, his lumbar spine x-ray was significant for degenerative disc disease. He should continue prednisone taper as prescribed and keep upcoming follow-up.   Godfrey PickKimberly S. Tiburcio PeaHarris, MSN, FNP-C [facility]  [address] [place], [place] [address] [phone]

## 2017-06-25 NOTE — Telephone Encounter (Signed)
Patient notified of results and will keep follow up appointment  

## 2017-07-01 ENCOUNTER — Ambulatory Visit: Payer: Self-pay | Admitting: Cardiovascular Disease

## 2017-07-03 MED FILL — ROSUVASTATIN CALCIUM 10 MG: 10 | 30 days supply | Qty: 30 | Fill #8

## 2017-07-05 DIAGNOSIS — I25810 Atherosclerosis of coronary artery bypass graft(s) without angina pectoris: Secondary | ICD-10-CM | POA: Insufficient documentation

## 2017-07-08 ENCOUNTER — Ambulatory Visit: Payer: Medicaid Other | Admitting: Family Medicine

## 2017-07-13 ENCOUNTER — Other Ambulatory Visit: Payer: Self-pay | Admitting: Cardiovascular Disease

## 2017-07-17 ENCOUNTER — Other Ambulatory Visit: Payer: Self-pay

## 2017-07-22 ENCOUNTER — Ambulatory Visit: Payer: Medicaid Other | Admitting: Family Medicine

## 2017-07-23 ENCOUNTER — Ambulatory Visit: Payer: Medicaid Other | Admitting: Family Medicine

## 2017-07-26 ENCOUNTER — Encounter: Payer: Self-pay | Admitting: Family Medicine

## 2017-07-26 ENCOUNTER — Ambulatory Visit: Payer: Medicaid Other | Admitting: Family Medicine

## 2017-07-26 DIAGNOSIS — I25709 Atherosclerosis of coronary artery bypass graft(s), unspecified, with unspecified angina pectoris: Secondary | ICD-10-CM

## 2017-07-26 MED ORDER — NITROGLYCERIN 0.4 MG SL SUBL: 0.4000 mg | SUBLINGUAL_TABLET | SUBLINGUAL | 3 refills | Status: DC | PRN

## 2017-07-26 NOTE — Progress Notes (Signed)
Patient ID: [name] Sosa, male    DOB: 05/29/1956, 62 y.o.   MRN: [id number]  PCP: [name] Sosa, [name] Broom Sosa, [name] Sosa  Chief Complaint  Patient presents with  . Follow-up    2 weeks on back pain    Subjective:  HPI [name] Sosa is a 62 y.o. male presents for evaluation of back pain which has completely resolved. He was under the impression he needed to return regardless of whether or not symptoms improved.  Visit was ended and coded as no charge. No exam completed. Social History   Socioeconomic History  . Marital status: Single    Spouse name: Not on file  . Number of children: 3  . Years of education: AS + 2  . Highest education level: Not on file  Social Needs  . Financial resource strain: Not on file  . Food insecurity - worry: Not on file  . Food insecurity - inability: Not on file  . Transportation needs - medical: Not on file  . Transportation needs - non-medical: Not on file  Occupational History  . Occupation: [employer]  Tobacco Use  . Smoking status: Former Smoker    Years: 2.00    Last attempt to quit: 06/19/1998    Years since quitting: 19.1  . Smokeless tobacco: Never Used  Substance and Sexual Activity  . Alcohol use: No  . Drug use: No  . Sexual activity: Yes  Other Topics Concern  . Not on file  Social History Narrative   Lives at home alone   Right-handed   Caffeine: 2 cups per day    Family History  Problem Relation Age of Onset  . Heart failure Mother   . Hypertension Mother   . Heart disease Mother   . Heart failure Father   . Hypertension Father   . Heart disease Father        before age 61  . Hypertension Sister   . Hypertension Brother   . Heart attack Neg Hx   . Stroke Neg Hx      Review of Systems  Patient Active Problem List   Diagnosis Date Noted  . Left carotid artery stenosis 10/16/2016  . Carotid artery disease (HCC) 10/05/2016  . Diplopia 05/03/2016  . 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 Unstable angina (HCC) 03/16/2016   . Coronary artery disease involving native coronary artery of native heart with unstable angina pectoris (HCC) 03/28/2011  . Hyperlipidemia 07/12/2009  . HYPERTENSION, BENIGN 07/12/2009  . CAD, ARTERY BYPASS GRAFT 07/12/2009  . CHEST PAIN-UNSPECIFIED 06/30/2009    Allergies  Allergen Reactions  . Lisinopril Cough    Prior to Admission medications   Medication Sig Start Date End Date Taking? Authorizing Provider  aspirin 81 MG chewable tablet Chew 81 mg by mouth daily.   Yes [provider]  carvedilol (COREG) 3.125 MG tablet TAKE ONE TABLET BY MOUTH TWICE DAILY WITH A MEAL 07/15/17  Yes Kathleene HazelMcAlhany, Christopher D, MD  lisinopril (PRINIVIL,ZESTRIL) 20 MG tablet Take 1 tablet (20 mg total) by mouth daily. 04/17/17  Yes Kathleene HazelMcAlhany, Christopher D, MD  nitroGLYCERIN (NITROSTAT) 0.4 MG SL tablet Place 1 tablet (0.4 mg total) under the tongue every 5 (five) minutes as needed for chest pain (up to 3 doses). 04/20/16  Yes Kathleene HazelMcAlhany, Christopher D, MD  predniSONE (DELTASONE) 20 MG tablet Take 3 PO QAM x3days, 2 PO QAM x3days, 1 PO QAM x3days 06/24/17  Yes [name] Sosa, [name] Bones Sosa, [name] Sosa  rosuvastatin (CRESTOR) 10 MG tablet Take 1 tablet (10 mg  total) by mouth daily. 10/29/16  Yes [name] Neighbors, [name] Sosa    Past Medical, Surgical Family and Social History reviewed and updated.    Objective:   Today'Sosa Vitals   07/26/17 1125  BP: 134/84  Pulse: 62  Resp: 12  Temp: 98 F (36.7 C)  TempSrc: Oral  SpO2: 100%  Weight: 128 lb 9.6 oz (58.3 kg)  Height: 5\' 7"  (1.702 m)    Wt Readings from Last 3 Encounters:  07/26/17 128 lb 9.6 oz (58.3 kg)  06/24/17 129 lb (58.5 kg)  04/17/17 125 lb 12.8 oz (57.1 kg)    Physical Exam         Assessment & Plan:  There are no diagnoses linked to this encounter.   Godfrey Pick. Tiburcio Pea, MSN, [name] Sosa-C [facility]  [address] [place], [place] [address] [phone]

## 2017-08-01 MED FILL — ROSUVASTATIN CALCIUM 10 MG: 10 | 30 days supply | Qty: 30 | Fill #9

## 2017-08-07 ENCOUNTER — Encounter (HOSPITAL_COMMUNITY): Payer: Self-pay

## 2017-08-07 ENCOUNTER — Ambulatory Visit: Payer: Self-pay | Admitting: Vascular Surgery

## 2017-08-09 ENCOUNTER — Encounter (HOSPITAL_COMMUNITY): Payer: Self-pay

## 2017-08-09 ENCOUNTER — Ambulatory Visit: Payer: Self-pay | Admitting: Vascular Surgery

## 2017-08-12 NOTE — Progress Notes (Signed)
Established Carotid Patient   History of Present Illness   [name] Sosa is a 62 y.o. (02/16/1956) male who presents with chief complaint: no complaints.  Patient is s/p L CEA for left sx LICA  Ulcerated plaque >70%.  Previous carotid studies demonstrated: RICA <50% stenosis, LICA >70% stenosis.  Patient has history of TIA symptoms.  The patient initially had diplopia for about 4 months which resolved spontaneously.  He then had episodes of slurred speech.  This was short lived and resolved.  This has recurred multiple times with the most recent in the last week.  The patient has never had amaurosis fugax or monocular blindness.  The patient has never had facial drooping or hemiplegia.  The patient has never had receptive aphasia.   The patient's previous neurologic deficits have resolved.    The patient's PMH, PSH, SH, and FamHx are unchanged from 11/02/16.  Current Outpatient Medications  Medication Sig Dispense Refill  . aspirin 81 MG chewable tablet Chew 81 mg by mouth daily.    . carvedilol (COREG) 3.125 MG tablet TAKE ONE TABLET BY MOUTH TWICE DAILY WITH A MEAL 180 tablet 2  . lisinopril (PRINIVIL,ZESTRIL) 20 MG tablet Take 1 tablet (20 mg total) by mouth daily. 90 tablet 3  . nitroGLYCERIN (NITROSTAT) 0.4 MG SL tablet Place 1 tablet (0.4 mg total) under the tongue every 5 (five) minutes as needed for chest pain (up to 3 doses). 25 tablet 3  . predniSONE (DELTASONE) 20 MG tablet Take 3 PO QAM x3days, 2 PO QAM x3days, 1 PO QAM x3days 18 tablet 0  . rosuvastatin (CRESTOR) 10 MG tablet Take 1 tablet (10 mg total) by mouth daily. 90 tablet 3   No current facility-administered medications for this visit.     On ROS today: no CVA or TIA sx, no intermittent claudication    Physical Examination   Vitals:   08/14/17 1316 08/14/17 1318  BP: 129/78 129/79  Pulse: 61   Resp: 20   SpO2: 98%   Weight: 132 lb (59.9 kg)   Height: 5\' 7"  (1.702 m)    Body mass index is 20.67  kg/m.  General Alert, O x 3, WD, NAD  Neck Supple, mid-line trachea,    Pulmonary Sym exp, good B air movt, CTA B  Cardiac RRR, Nl S1, S2, no Murmurs, No rubs, No S3,S4  Vascular Vessel Right Left  Radial Palpable Palpable  Brachial Palpable Palpable  Carotid Palpable, No Bruit Palpable, No Bruit  Aorta Not palpable N/A  Femoral Palpable Palpable  Popliteal Not palpable Not palpable  PT Palpable Palpable  DP Palpable Palpable    Gastro- intestinal soft, non-distended, non-tender to palpation, No guarding or rebound, no HSM, no masses, no CVAT B, No palpable prominent aortic pulse,    Musculo- skeletal M/S 5/5 throughout  , Extremities without ischemic changes    Neurologic Cranial nerves 2-12 intact , Pain and light touch intact in extremities , Motor exam as listed above    Non-Invasive Vascular Imaging   B Carotid Duplex (08/14/2017):   R ICA stenosis:  40-59%  R VA: patent and antegrade  L ICA stenosis:  patent CEA  L VA: patent and antegrade   Medical Decision Making   [name] Sosa is a 62 y.o. male who presents with: asx RICA stenosis <50%, s/p L CEA for sx LICA stenosi >70%    Based on the patient's vascular studies and examination, I have offered the patient: annual B carotid duplex.  I discussed in depth with the patient the nature of atherosclerosis, and emphasized the importance of maximal medical management including strict control of blood pressure, blood glucose, and lipid levels, antiplatelet agents, obtaining regular exercise, and cessation of smoking.    The patient is aware that without maximal medical management the underlying atherosclerotic disease process will progress, limiting the benefit of any interventions. The patient is currently on a statin: Crestor.  The patient is currently on an anti-platelet: ASA.  Thank you for allowing [place] to participate in this patient's care.   Leonides Sake, MD, FACS [facility] Office: [phone] Pager: [phone]

## 2017-08-14 ENCOUNTER — Ambulatory Visit (INDEPENDENT_AMBULATORY_CARE_PROVIDER_SITE_OTHER): Payer: Medicaid Other | Admitting: Vascular Surgery

## 2017-08-14 ENCOUNTER — Ambulatory Visit (HOSPITAL_COMMUNITY)
Admission: RE | Admit: 2017-08-14 | Discharge: 2017-08-14 | Disposition: A | Discharge status: Home / Self Care | Payer: Medicaid Other | Source: Ambulatory Visit | Attending: Vascular Surgery | Admitting: Vascular Surgery

## 2017-08-14 ENCOUNTER — Encounter: Payer: Self-pay | Admitting: Vascular Surgery

## 2017-08-14 VITALS — BP 129/79 | HR 61 | Resp 20 | Ht 67.0 in | Wt 132.0 lb

## 2017-08-14 DIAGNOSIS — I6523 Occlusion and stenosis of bilateral carotid arteries: Secondary | ICD-10-CM | POA: Diagnosis not present

## 2017-08-14 DIAGNOSIS — I779 Disorder of arteries and arterioles, unspecified: Secondary | ICD-10-CM | POA: Diagnosis not present

## 2017-08-14 DIAGNOSIS — I739 Peripheral vascular disease, unspecified: Secondary | ICD-10-CM

## 2017-08-14 LAB — VAS US CAROTID
LCCADDIAS: 23 cm/s
LEFT ECA DIAS: -14 cm/s
LEFT VERTEBRAL DIAS: 9 cm/s
LICADDIAS: -20 cm/s
Left CCA dist sys: 109 cm/s
Left CCA prox dias: 18 cm/s
Left CCA prox sys: 94 cm/s
Left ICA dist sys: -56 cm/s
Left ICA prox dias: -17 cm/s
Left ICA prox sys: -100 cm/s
RCCADSYS: -74 cm/s
RIGHT CCA MID DIAS: 22 cm/s
RIGHT ECA DIAS: 37 cm/s
RIGHT VERTEBRAL DIAS: 20 cm/s
Right CCA prox dias: 17 cm/s
Right CCA prox sys: 90 cm/s

## 2017-09-09 MED FILL — ROSUVASTATIN CALCIUM 10 MG: 10 | 30 days supply | Qty: 30 | Fill #10

## 2017-10-09 MED FILL — ROSUVASTATIN CALCIUM 10 MG: 10 | 30 days supply | Qty: 30 | Fill #11

## 2017-11-06 ENCOUNTER — Other Ambulatory Visit: Payer: Self-pay | Admitting: Family Medicine

## 2017-11-06 DIAGNOSIS — E7849 Other hyperlipidemia: Secondary | ICD-10-CM

## 2017-11-19 MED FILL — ROSUVASTATIN CALCIUM 10 MG: 10 | 30 days supply | Qty: 30 | Fill #0

## 2017-11-20 ENCOUNTER — Ambulatory Visit (INDEPENDENT_AMBULATORY_CARE_PROVIDER_SITE_OTHER): Payer: Medicaid Other | Admitting: Family Medicine

## 2017-11-20 ENCOUNTER — Encounter: Payer: Self-pay | Admitting: Family Medicine

## 2017-11-20 VITALS — BP 142/80 | HR 66 | Temp 98.4°F | Ht 67.0 in | Wt 135.0 lb

## 2017-11-20 DIAGNOSIS — K047 Periapical abscess without sinus: Secondary | ICD-10-CM | POA: Diagnosis not present

## 2017-11-20 DIAGNOSIS — Z09 Encounter for follow-up examination after completed treatment for conditions other than malignant neoplasm: Secondary | ICD-10-CM

## 2017-11-20 DIAGNOSIS — R7303 Prediabetes: Secondary | ICD-10-CM

## 2017-11-20 DIAGNOSIS — K0889 Other specified disorders of teeth and supporting structures: Secondary | ICD-10-CM

## 2017-11-20 LAB — POCT GLYCOSYLATED HEMOGLOBIN (HGB A1C): Hemoglobin A1C: 5.9 % — AB (ref 4.0–5.6)

## 2017-11-20 MED ORDER — CLINDAMYCIN HCL 300 MG PO CAPS: 300.0000 mg | ORAL_CAPSULE | Freq: Three times a day (TID) | ORAL | 0 refills | Status: DC

## 2017-11-20 NOTE — Progress Notes (Addendum)
Subjective:    Patient ID: [name] Sosa, male    DOB: [birth year], 62 y.o.   MRN: [id number]   PCP: Raliegh Ip, NP  Chief Complaint  Patient presents with  . Dental Injury    HPI  [name] Sosa has a history of Stroke, Hypertension, Hyperlipidemia, and CAD. He is here today for assessment of his toothache.  Current Status: He states that he has had a right sided toothache for 2 days. He has been using Acetaminophen to help with the pain. He reports that he has had mild facial swelling on the right side of his face. He has had this problem before, but he has been unable to afford to go to a dentist. He believes that he has an infection. Denies fevers, chills, fatigue, and night sweats. Denies visual changes, headaches, dizziness, unsteadiness, and falls. No reports of cough, chest pain, heart palpitations, and shortness of breath. He denies any GI symptoms. Denies any bleeding episodes.   Past Medical History:  Diagnosis Date  . CAD (coronary artery disease)    s/p 7 vessel CABG 2000 at [facility]  . Carotid artery occlusion   . High cholesterol   . Hyperlipidemia   . Hypertension   . Pre-diabetes    < 6.1 2017  . Stroke [facility]) 04/2016   TIA- 04/2016 double vision 2nd - speech - no residual  . 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 Heart failure Mother   . Hypertension Mother   . Heart disease Mother   . Heart failure Father   . Hypertension Father   . Heart disease Father        before age 23  . Hypertension Sister   . Hypertension Brother   . Heart attack Neg Hx   . Stroke Neg Hx    Social History   Socioeconomic History  . Marital status: Single    Spouse name: Not on file  . Number of children: 3  . Years of education: AS + 2  . Highest education level: Not on file  Occupational History  . Occupation: Statistician  Social Needs  . Financial resource strain: Not on file  . Food insecurity:    Worry: Not on file      Inability: Not on file  . Transportation needs:    Medical: Not on file    Non-medical: Not on file  Tobacco Use  . Smoking status: Former Smoker    Years: 2.00    Last attempt to quit: 06/19/1998    Years since quitting: 19.4  . Smokeless tobacco: Never Used  Substance and Sexual Activity  . Alcohol use: No  . Drug use: No  . Sexual activity: Yes  Lifestyle  . Physical activity:    Days per week: Not on file    Minutes per session: Not on file  . Stress: Not on file  Relationships  . Social connections:    Talks on phone: Not on file    Gets together: Not on file    Attends religious service: Not on file    Active member of club or organization: Not on file    Attends meetings of clubs or organizations: Not on file    Relationship status: Not on file  . Intimate partner violence:    Fear of current or ex partner: Not on file    Emotionally abused: Not on file    Physically abused: Not on file  Forced sexual activity: Not on file  Other Topics Concern  . Not on file  Social History Narrative   Lives at home alone   Right-handed   Caffeine: 2 cups per day   Past Surgical History:  Procedure Laterality Date  . 4v cabg    . CARDIAC CATHETERIZATION    . CARDIAC CATHETERIZATION N/A 03/23/2016   Procedure: Left Heart Cath and Cors/Grafts Angiography;  Surgeon: Kathleene Hazel, MD;  Location: [facility] INVASIVE CV LAB;  Service: Cardiovascular;  Laterality: N/A;  . CAROTID ANGIOGRAPHY N/A 10/11/2016   Procedure: Carotid Angiography / Cerebral angiogram;  Surgeon: Fransisco Hertz, MD;  Location: [facility] INVASIVE CV LAB;  Service: Cardiovascular;  Laterality: N/A;  . CAROTID ENDARTERECTOMY    . COLONOSCOPY  2017  . COLONOSCOPY W/ POLYPECTOMY    . CORONARY ARTERY BYPASS GRAFT    . ENDARTERECTOMY Left 10/16/2016   Procedure: LEFT CAROTID ENDARTERECTOMY WITH PATCH ANGIOPLASTY;  Surgeon: Fransisco Hertz, MD;  Location: [facility] OR;  Service: Vascular;  Laterality: Left;  . patent grafts    .  s/p 7    . UPPER GASTROINTESTINAL ENDOSCOPY      Immunization History  Administered Date(s) Administered  . Tdap 06/18/2008    Current Meds  Medication Sig  . aspirin 81 MG chewable tablet Chew 81 mg by mouth daily.  . carvedilol (COREG) 3.125 MG tablet TAKE ONE TABLET BY MOUTH TWICE DAILY WITH A MEAL  . lisinopril (PRINIVIL,ZESTRIL) 20 MG tablet Take 1 tablet (20 mg total) by mouth daily.  . nitroGLYCERIN (NITROSTAT) 0.4 MG SL tablet Place 1 tablet (0.4 mg total) under the tongue every 5 (five) minutes as needed for chest pain (up to 3 doses).  . rosuvastatin (CRESTOR) 10 MG tablet TAKE 1 TABLET BY MOUTH DAILY.   Allergies  Allergen Reactions  . Lisinopril Cough    BP (!) 142/80 (BP Location: Left Arm, Patient Position: Sitting, Cuff Size: Small)   Pulse 66   Temp 98.4 F (36.9 C) (Oral)   Ht 5\' 7"  (1.702 m)   Wt 135 lb (61.2 kg)   SpO2 100%   BMI 21.14 kg/m      Review of Systems  Constitutional: Negative.   HENT: Positive for dental problem (lower right tooth abscess).   Eyes: Negative.   Respiratory: Negative.   Cardiovascular: Negative.   Gastrointestinal: Negative.   Endocrine: Negative.   Genitourinary: Negative.   Musculoskeletal: Negative.   Skin: Negative.   Neurological: Negative.   Hematological: Negative.   Psychiatric/Behavioral: Negative.    Objective:   Physical Exam  Constitutional: He is oriented to person, place, and time. He appears well-developed and well-nourished.  HENT:  Head: Normocephalic and atraumatic.    Right Ear: External ear normal.  Left Ear: External ear normal.  Nose: Nose normal.  Lowe right tooth abscess, mild swelling, erythema  Eyes: Pupils are equal, round, and reactive to light. Conjunctivae and EOM are normal.  Neck: Normal range of motion. Neck supple.  Cardiovascular: Normal rate, regular rhythm, normal heart sounds and intact distal pulses.  Pulmonary/Chest: Effort normal and breath sounds normal.    Abdominal: Soft. Bowel sounds are normal.  Musculoskeletal: Normal range of motion.  Neurological: He is alert and oriented to person, place, and time.  Skin: Skin is warm and dry. Capillary refill takes less than 2 seconds.  Psychiatric: He has a normal mood and affect. His behavior is normal. Judgment and thought content normal.  Nursing note and vitals reviewed.  Assessment & Plan:   1. Prediabetes - POCT glycosylated hemoglobin (Hb A1C)  2. Tooth abscess - clindamycin (CLEOCIN) 300 MG capsule; Take 1 capsule (300 mg total) by mouth 3 (three) times daily.  Dispense: 30 capsule; Refill: 0 - Ambulatory referral to Dentistry Patient is advised to take probiotics while taking Clindamycin, which can cause diarrhea. Probiotics or adding yogart to diet to replace natural flora would be beneficial.    3. Tooth pain He will take antibiotic as prescribed. Recommended that he do salt water rinses, swish and swallow every 3-4 hours. He will continue Acetaminophen and Ibuprofen as needed.   4. Follow up He will follow up in 3 months.  Meds ordered this encounter  Medications  . clindamycin (CLEOCIN) 300 MG capsule    Sig: Take 1 capsule (300 mg total) by mouth 3 (three) times daily.    Dispense:  30 capsule    Refill:  0   Raliegh IpNatalie Jaeli Grubb,  MSN, FNP-BC Patient [facility] [address]  [place], [place] [id number] [phone]

## 2017-11-20 NOTE — Patient Instructions (Addendum)
Clindamycin capsules What is this medicine? CLINDAMYCIN (Hobson sin) is a lincosamide antibiotic. It is used to treat certain kinds of bacterial infections. It will not work for colds, flu, or other viral infections. This medicine may be used for other purposes; ask your health care provider or pharmacist if you have questions. COMMON BRAND NAME(S): Cleocin What should I tell my health care provider before I take this medicine? They need to know if you have any of these conditions: -kidney disease -liver disease -stomach problems like colitis -an unusual or allergic reaction to clindamycin, lincomycin, or other medicines, foods, dyes like tartrazine or preservatives -pregnant or trying to get pregnant -breast-feeding How should I use this medicine? Take this medicine by mouth with a full glass of water. Follow the directions on the prescription label. You can take this medicine with food or on an empty stomach. If the medicine upsets your stomach, take it with food. Take your medicine at regular intervals. Do not take your medicine more often than directed. Take all of your medicine as directed even if you think your are better. Do not skip doses or stop your medicine early. Talk to your pediatrician regarding the use of this medicine in children. Special care may be needed. Overdosage: If you think you have taken too much of this medicine contact a poison control center or emergency room at once. NOTE: This medicine is only for you. Do not share this medicine with others. What if I miss a dose? If you miss a dose, take it as soon as you can. If it is almost time for your next dose, take only that dose. Do not take double or extra doses. What may interact with this medicine? -birth control pills -erythromycin -medicines that relax muscles for surgery -rifampin This list may not describe all possible interactions. Give your health care provider a list of all the medicines, herbs,  non-prescription drugs, or dietary supplements you use. Also tell them if you smoke, drink alcohol, or use illegal drugs. Some items may interact with your medicine. What should I watch for while using this medicine? Tell your doctor or healthcare professional if your symptoms do not start to get better or if they get worse. Do not treat diarrhea with over the counter products. Contact your doctor if you have diarrhea that lasts more than 2 days or if it is severe and watery. What side effects may I notice from receiving this medicine? Side effects that you should report to your doctor or health care professional as soon as possible: -allergic reactions like skin rash, itching or hives, swelling of the face, lips, or tongue -dark urine -pain on swallowing -redness, blistering, peeling or loosening of the skin, including inside the mouth -unusual bleeding or bruising -unusually weak or tired -yellowing of eyes or skin Side effects that usually do not require medical attention (report to your doctor or health care professional if they continue or are bothersome): -diarrhea -itching in the rectal or genital area -joint pain -nausea, vomiting -stomach pain This list may not describe all possible side effects. Call your doctor for medical advice about side effects. You may report side effects to FDA at 1-800-FDA-1088. Where should I keep my medicine? Keep out of the reach of children. Store at room temperature between 20 and 25 degrees C (68 and 77 degrees F). Throw away any unused medicine after the expiration date. NOTE: This sheet is a summary. It may not cover all possible information. If you  have questions about this medicine, talk to your doctor, pharmacist, or health care provider.  2018 Elsevier/Gold Standard (2015-09-07 16:34:00)    Lactobacillus Oral formulations What is this medicine? LACTOBACILLUS (lak toh buh SIL uhs) is a supplement. It is used to help the normal balance of  bacteria in the colon. This may treat or prevent diarrhea caused by an infection or by antibiotics. The FDA has not approved this supplement for any medical use. This supplement may be used for other purposes; ask your health care provider or pharmacist if you have questions. This medicine may be used for other purposes; ask your health care provider or pharmacist if you have questions. COMMON BRAND NAME(S): BioGaia, Culturelle, Culturelle Kids, Florajen, Floranex, Floranex Granules, Probiotic Acidophilus What should I tell my health care provider before I take this medicine? They need to know if you have any of these conditions: -chronic disease -immune system problems -prosthetic heart valve or valvular heart disease -an unusual or allergic reaction to Lactobacillus, any medicines, lactose or milk, other foods, dyes, or preservatives -pregnant or trying to get pregnant -breast-feeding How should I use this medicine? Take this medicine by mouth with a small amount of milk, fruit juice, or water. Follow the directions on the package labeling, or take as directed by your health care professional. This medicine can be taken with cereal or other food. Do not take this medicine more often than directed. Contact your pediatrician regarding the use of this medicine in children. Special care may be needed. This medicine is not recommended for children under 61 years old unless prescribed by a doctor. Overdosage: If you think you have taken too much of this medicine contact a poison control center or emergency room at once. NOTE: This medicine is only for you. Do not share this medicine with others. What if I miss a dose? If you miss a dose, take it as soon as you can. If it is almost time for your next dose, take only that dose. Do not take double or extra doses. What may interact with this medicine? Interactions are not expected. This list may not describe all possible interactions. Give your health  care provider a list of all the medicines, herbs, non-prescription drugs, or dietary supplements you use. Also tell them if you smoke, drink alcohol, or use illegal drugs. Some items may interact with your medicine. What should I watch for while using this medicine? See your doctor if your symptoms do not get better or if they get worse. Do not take this supplement for more than 2 days or if you have a fever unless your doctor tells you to. If you have allergies to milk or you are sensitive to lactose, do not use this supplement. What side effects may I notice from receiving this medicine? Side effects that you should report to your doctor or health care professional as soon as possible: -allergic reactions like skin rash, itching or hives, swelling of the face, lips, or tongue -breathing problems -severe nausea, vomiting -unusually weak or tired Side effects that usually do not require medical attention (report to your doctor or health care professional if they continue or are bothersome): -hiccups -stomach gas This list may not describe all possible side effects. Call your doctor for medical advice about side effects. You may report side effects to FDA at 1-800-FDA-1088. Where should I keep my medicine? Keep out of the reach of children. Store in the refrigerator or as directed on the package label. Do not  freeze. Throw away any unused medicine after the expiration date. NOTE: This sheet is a summary. It may not cover all possible information. If you have questions about this medicine, talk to your doctor, pharmacist, or health care provider.  2018 Elsevier/Gold Standard (2011-06-01 07:41:13)

## 2017-11-25 ENCOUNTER — Telehealth: Payer: Self-pay

## 2017-11-25 NOTE — Telephone Encounter (Signed)
-----   Message from Kallie LocksNatalie M Stroud, FNP sent at 11/23/2017  8:50 PM EDT ----- Regarding: "Labs Results" Lyla SonCarrie,  Please call to inform him to add Probiotic or Yogart to his diet while taking Clindamycin. Replaces flora, lost by diarrhea.

## 2017-11-25 NOTE — Telephone Encounter (Signed)
Patient notified

## 2017-12-23 MED FILL — ROSUVASTATIN CALCIUM 10 MG: 10 | 30 days supply | Qty: 30 | Fill #1

## 2018-01-20 MED FILL — ROSUVASTATIN CALCIUM 10 MG: 10 | 90 days supply | Qty: 90 | Fill #2

## 2018-01-24 ENCOUNTER — Ambulatory Visit: Payer: Medicaid Other | Admitting: Family Medicine

## 2018-02-06 ENCOUNTER — Encounter: Payer: Self-pay | Admitting: Cardiovascular Disease

## 2018-02-06 ENCOUNTER — Ambulatory Visit (INDEPENDENT_AMBULATORY_CARE_PROVIDER_SITE_OTHER): Payer: Self-pay | Admitting: Cardiovascular Disease

## 2018-02-06 VITALS — BP 132/80 | HR 65 | Ht 67.0 in | Wt 134.4 lb

## 2018-02-06 DIAGNOSIS — I25709 Atherosclerosis of coronary artery bypass graft(s), unspecified, with unspecified angina pectoris: Secondary | ICD-10-CM

## 2018-02-06 DIAGNOSIS — E7800 Pure hypercholesterolemia, unspecified: Secondary | ICD-10-CM

## 2018-02-06 DIAGNOSIS — I10 Essential (primary) hypertension: Secondary | ICD-10-CM

## 2018-02-06 DIAGNOSIS — I6523 Occlusion and stenosis of bilateral carotid arteries: Secondary | ICD-10-CM

## 2018-02-06 NOTE — Progress Notes (Signed)
Chief Complaint  Patient presents with  . Follow-up    CAD   History of Present Illness: 62 yo male with h/o CAD s/p 7V CABG in 2000 at [facility], HTN, hyperlipidemia, carotid artery disease and prior TIA here today for cardiac follow up. He was seen as a new pt in January 2011 with complaints of chest pain. He had been followed from 2000 until 2011 by Dr. [name] in [place]. He has had frequent episodes of chest pain since I met him in 2011. Cardiac cath in 2011 with 5/7 patent bypass grafts. Stress myoview in August 2015 with no ischemia, LVEF=48%. Last cardiac cath October 2017 with patent LIMA to LAD, patent SVG to OM which filled all of the Circumflex and occluded RCA with occluded SVG to RCA. The RCA filled from left to right collaterals. He underwent left carotid endarterectomy in May 2018 following a TIA. His carotid disease is followed in [facility].     He is here today for follow up. The patient denies any dyspnea, palpitations, lower extremity edema, orthopnea, PND, dizziness, near syncope or syncope. He did have one episode of resting chest pain last week while lying in bed. Lasted for several minutes. No associated dyspnea. No recurrence. No exertional chest pain.   Primary Care Physician: Azzie Glatter, FNP  Past Medical History:  Diagnosis Date  . CAD (coronary artery disease)    s/p 7 vessel CABG 2000 at [facility]  . Carotid artery occlusion   . High cholesterol   . Hyperlipidemia   . Hypertension   . Pre-diabetes    < 6.1 2017  . Stroke ([place]) 04/2016   TIA- 04/2016 double vision 2nd - speech - no residual  . 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 4v cabg    . CARDIAC CATHETERIZATION    . CARDIAC CATHETERIZATION N/A 03/23/2016   Procedure: Left Heart Cath and Cors/Grafts Angiography;  Surgeon: Burnell Blanks, MD;  Location: [place] CV LAB;  Service: Cardiovascular;  Laterality: N/A;  . CAROTID ANGIOGRAPHY  N/A 10/11/2016   Procedure: Carotid Angiography / Cerebral angiogram;  Surgeon: Conrad [place], MD;  Location: [place] CV LAB;  Service: Cardiovascular;  Laterality: N/A;  . CAROTID ENDARTERECTOMY    . COLONOSCOPY  2017  . COLONOSCOPY W/ POLYPECTOMY    . CORONARY ARTERY BYPASS GRAFT    . ENDARTERECTOMY Left 10/16/2016   Procedure: LEFT CAROTID ENDARTERECTOMY WITH PATCH ANGIOPLASTY;  Surgeon: Conrad [place], MD;  Location: [place];  Service: Vascular;  Laterality: Left;  . patent grafts    . s/p 7    . UPPER GASTROINTESTINAL ENDOSCOPY      Current Outpatient Medications  Medication Sig Dispense Refill  . aspirin 81 MG chewable tablet Chew 81 mg by mouth daily.    . carvedilol (COREG) 3.125 MG tablet TAKE ONE TABLET BY MOUTH TWICE DAILY WITH A MEAL 180 tablet 2  . lisinopril (PRINIVIL,ZESTRIL) 20 MG tablet Take 1 tablet (20 mg total) by mouth daily. 90 tablet 3  . nitroGLYCERIN (NITROSTAT) 0.4 MG SL tablet Place 1 tablet (0.4 mg total) under the tongue every 5 (five) minutes as needed for chest pain (up to 3 doses). 25 tablet 3  . rosuvastatin (CRESTOR) 10 MG tablet TAKE 1 TABLET BY MOUTH DAILY. 90 tablet 3   No current facility-administered medications for this visit.     Allergies  Allergen Reactions  . Lisinopril Cough  Social History   Socioeconomic History  . Marital status: Single    Spouse name: Not on file  . Number of children: 3  . Years of education: AS + 2  . Highest education level: Not on file  Occupational History  . Occupation: Paediatric nurse  Social Needs  . Financial resource strain: Not on file  . Food insecurity:    Worry: Not on file    Inability: Not on file  . Transportation needs:    Medical: Not on file    Non-medical: Not on file  Tobacco Use  . Smoking status: Former Smoker    Years: 2.00    Last attempt to quit: 06/19/1998    Years since quitting: 19.6  . Smokeless tobacco: Never Used  Substance and Sexual Activity  . Alcohol use: No  . Drug use:  No  . Sexual activity: Yes  Lifestyle  . Physical activity:    Days per week: Not on file    Minutes per session: Not on file  . Stress: Not on file  Relationships  . Social connections:    Talks on phone: Not on file    Gets together: Not on file    Attends religious service: Not on file    Active member of club or organization: Not on file    Attends meetings of clubs or organizations: Not on file    Relationship status: Not on file  . Intimate partner violence:    Fear of current or ex partner: Not on file    Emotionally abused: Not on file    Physically abused: Not on file    Forced sexual activity: Not on file  Other Topics Concern  . Not on file  Social History Narrative   Lives at home alone   Right-handed   Caffeine: 2 cups per day    Family History  Problem Relation Age of Onset  . Heart failure Mother   . Hypertension Mother   . Heart disease Mother   . Heart failure Father   . Hypertension Father   . Heart disease Father        before age 31  . Hypertension Sister   . Hypertension Brother   . Heart attack Neg Hx   . Stroke Neg Hx     Review of Systems:  As stated in the HPI and otherwise negative.   BP 132/80   Pulse 65   Ht '5\' 7"'$  (1.702 m)   Wt 134 lb 6.4 oz (61 kg)   SpO2 97%   BMI 21.05 kg/m   Physical Examination:  General: Well developed, well nourished, NAD  HEENT: OP clear, mucus membranes moist  SKIN: warm, dry. No rashes. Neuro: No focal deficits  Musculoskeletal: Muscle strength 5/5 all ext  Psychiatric: Mood and affect normal  Neck: No JVD, no carotid bruits, no thyromegaly, no lymphadenopathy.  Lungs:Clear bilaterally, no wheezes, rhonci, crackles Cardiovascular: Regular rate and rhythm. No murmurs, gallops or rubs. Abdomen:Soft. Bowel sounds present. Non-tender.  Extremities: No lower extremity edema. Pulses are 2 + in the bilateral DP/PT.  Cardiac cath 03/23/16: 1. Triple vessel CAD s/p 5V CABG with 2/5 patent bypass  grafts 2. LAD is occluded mid segment. LIMA graft is patent to the LAD 3. Circumflex is occluded proximally. The free RIMA graft to the first OM is patent and fills the entire Circumflex including all marginal branches.  4. The RCA is occluded proximally. The vein graft to the RCA is now occluded (new  since last cath). The distal RCA fills from left to right collaterals.  5. Both SVG grafts to Diagonal 1 and Diagonal 2 are known to be occluded 6. Moderate systolic LV dysfunction, OMAY=04-59%.   EKG:  EKG is ordered today. The EKG demonstrates Sinus bradycardia, rate 59 bpm. Old inferior infarct. Unchanged.   Recent Labs: 06/24/2017: ALT 16; BUN 16; Creatinine, Ser 0.82; Potassium 4.5; Sodium 141   Lipid Panel    Component Value Date/Time   CHOL 131 10/24/2016 1009   CHOL 117 08/09/2016 1040   TRIG 114 10/24/2016 1009   HDL 38 (L) 10/24/2016 1009   HDL 58 08/09/2016 1040   CHOLHDL 3.4 10/24/2016 1009   VLDL 23 10/24/2016 1009   LDLCALC 70 10/24/2016 1009   LDLCALC 52 08/09/2016 1040     Wt Readings from Last 3 Encounters:  02/06/18 134 lb 6.4 oz (61 kg)  11/20/17 135 lb (61.2 kg)  08/14/17 132 lb (59.9 kg)     Other studies Reviewed: Additional studies/ records that were reviewed today include: . Review of the above records demonstrates:    Assessment and Plan:   1. CAD s/p CABG with chronic stable angina: He continues to have chronic stable angina. Last cardiac cath in October 2017 with stable CAD. Continue ASA, statin and beta blocker.   2. HTN: BP is controlled. No changes today  3. HLD: LDL at goal. No changes.    4. Carotid artery disease: s/p left CEA in 2018. Followed in [facility]  Current medicines are reviewed at length with the patient today.  The patient does not have concerns regarding medicines.  The following changes have been made:  no change  Labs/ tests ordered today include:   Orders Placed This Encounter  Procedures  . Lipid Profile  . Comp Met  (CMET)  . EKG 12-Lead    Disposition:   FU with me in 12 months  Signed, [name], MD 02/06/2018 4:15 PM    [facility] [place], [place], [place]  [address] Phone: [phone]; Fax: [phone]

## 2018-02-06 NOTE — Patient Instructions (Signed)
Medication Instructions:  Your physician recommends that you continue on your current medications as directed. Please refer to the Current Medication list given to you today.   Labwork: Your physician recommends that you return for lab work on August 27,2019.  This will be fasting--Lipid profile and CMET.  The lab opens at 7:30 AM   Testing/Procedures: none  Follow-Up: Your physician recommends that you schedule a follow-up appointment in: 12 months. Please call our office in about 8 months to schedule this appointment    Any Other Special Instructions Will Be Listed Below (If Applicable).     If you need a refill on your cardiac medications before your next appointment, please call your pharmacy.

## 2018-02-11 ENCOUNTER — Other Ambulatory Visit: Payer: Self-pay

## 2018-02-12 ENCOUNTER — Other Ambulatory Visit: Payer: Self-pay | Admitting: *Deleted

## 2018-02-12 DIAGNOSIS — E7800 Pure hypercholesterolemia, unspecified: Secondary | ICD-10-CM

## 2018-02-12 DIAGNOSIS — I25709 Atherosclerosis of coronary artery bypass graft(s), unspecified, with unspecified angina pectoris: Secondary | ICD-10-CM

## 2018-02-12 LAB — LIPID PANEL
CHOL/HDL RATIO: 3.2 ratio (ref 0.0–5.0)
Cholesterol, Total: 145 mg/dL (ref 100–199)
HDL: 45 mg/dL (ref >39)
LDL CALC: 73 mg/dL (ref 0–99)
Triglycerides: 136 mg/dL (ref 0–149)
VLDL CHOLESTEROL CAL: 27 mg/dL (ref 5–40)

## 2018-02-12 LAB — COMPREHENSIVE METABOLIC PANEL
ALT: 13 IU/L (ref 0–44)
AST: 16 IU/L (ref 0–40)
Albumin/Globulin Ratio: 1.9 (ref 1.2–2.2)
Albumin: 4.2 g/dL (ref 3.6–4.8)
Alkaline Phosphatase: 50 IU/L (ref 39–117)
BUN/Creatinine Ratio: 25 — ABNORMAL HIGH (ref 10–24)
BUN: 19 mg/dL (ref 8–27)
Bilirubin Total: 0.3 mg/dL (ref 0.0–1.2)
CO2: 23 mmol/L (ref 20–29)
CREATININE: 0.75 mg/dL — AB (ref 0.76–1.27)
Calcium: 9.2 mg/dL (ref 8.6–10.2)
Chloride: 103 mmol/L (ref 96–106)
GFR calc Af Amer: 114 mL/min/{1.73_m2} (ref >59)
GFR, EST NON AFRICAN AMERICAN: 99 mL/min/{1.73_m2} (ref >59)
Globulin, Total: 2.2 g/dL (ref 1.5–4.5)
Glucose: 97 mg/dL (ref 65–99)
Potassium: 4.5 mmol/L (ref 3.5–5.2)
Sodium: 141 mmol/L (ref 134–144)
Total Protein: 6.4 g/dL (ref 6.0–8.5)

## 2018-02-19 ENCOUNTER — Other Ambulatory Visit: Payer: Self-pay

## 2018-02-19 MED ORDER — CARVEDILOL 3.125 MG PO TABS: 3.1250 mg | ORAL_TABLET | Freq: Two times a day (BID) | ORAL | 2 refills | Status: DC

## 2018-02-27 DIAGNOSIS — Z0289 Encounter for other administrative examinations: Secondary | ICD-10-CM | POA: Insufficient documentation

## 2018-04-21 MED FILL — ROSUVASTATIN CALCIUM 10 MG: 10 | 90 days supply | Qty: 90 | Fill #3

## 2018-04-29 ENCOUNTER — Ambulatory Visit: Payer: Medicaid Other | Admitting: Family Medicine

## 2018-05-02 ENCOUNTER — Ambulatory Visit: Payer: Medicaid Other | Admitting: Family Medicine

## 2018-05-07 ENCOUNTER — Ambulatory Visit: Payer: Medicaid Other | Admitting: Family Medicine

## 2018-06-03 ENCOUNTER — Other Ambulatory Visit: Payer: Self-pay | Admitting: Cardiovascular Disease

## 2018-07-28 MED FILL — ROSUVASTATIN CALCIUM 10 MG: 10 | 90 days supply | Qty: 90 | Fill #4

## 2018-07-30 ENCOUNTER — Ambulatory Visit: Payer: 59 | Attending: Orthopedic Surgery | Admitting: Physical Therapy

## 2018-07-30 ENCOUNTER — Encounter: Payer: Self-pay | Admitting: Physical Therapy

## 2018-07-30 ENCOUNTER — Other Ambulatory Visit: Payer: Self-pay

## 2018-07-30 DIAGNOSIS — M25511 Pain in right shoulder: Secondary | ICD-10-CM | POA: Insufficient documentation

## 2018-07-30 DIAGNOSIS — G8929 Other chronic pain: Secondary | ICD-10-CM | POA: Insufficient documentation

## 2018-07-30 DIAGNOSIS — M6281 Muscle weakness (generalized): Secondary | ICD-10-CM | POA: Diagnosis present

## 2018-07-30 NOTE — Patient Instructions (Signed)

## 2018-07-30 NOTE — Therapy (Signed)
[facility] [address] [place], [place], [address] Phone: [phone]   Fax:  [phone]  Physical Therapy Treatment  Patient Details  Name: [name] PerchesSeyed Sosa MRN: [id number] Date of Birth: 10/04/1955 Referring Provider (PT): Patsy LagerAlex Creighton, MD   Encounter Date: 07/30/2018  PT End of Session - 07/30/18 1645    Visit Number  1    Number of Visits  16    Date for PT Re-Evaluation  09/24/18    Authorization Type  Aet[employer]    PT Start Time  1540    PT Stop Time  1622    PT Time Calculation (min)  42 min    Activity Tolerance  Patient tolerated treatment well    Behavior During Therapy  [facility] for tasks assessed/performed       Past Medical History:  Diagnosis Date  . CAD (coronary artery disease)    [facility]  . Carotid artery occlusion   . High cholesterol   . Hyperlipidemia   . Hypertension   . Pre-diabetes    < 6.1 2017  . Stroke (HCC) 04/2016   TIA- 04/2016 double vision 2nd - speech - no residual  . 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 4v cabg    . CARDIAC CATHETERIZATION    . CARDIAC CATHETERIZATION N/A 03/23/2016   Procedure: Left Heart Cath and Cors/Grafts Angiography;  Surgeon: Kathleene Hazelhristopher D McAlhany, MD;  [facility];  Service: Cardiovascular;  Laterality: N/A;  . CAROTID ANGIOGRAPHY N/A 10/11/2016   Procedure: Carotid Angiography / Cerebral angiogram;  Surgeon: Fransisco HertzBrian L Chen, MD;  [facility]: [facility] INV[facility] CV LAB;  Service: Cardiovascular;  Laterality: N/A;  . CAROTID ENDARTERECTOMY    . COLONOSCOPY  2017  . COLONOSCOPY W/ POLYPECTOMY    . CORONARY ARTERY BYPASS GRAFT    . ENDARTERECTOMY Left 10/16/2016   Procedure: LEFT CAROTID ENDARTERECTOMY WITH PATCH ANGIOPLASTY;  Surgeon: Fransisco HertzBrian L Chen, MD;  [facility];  Service: Vascular;  Laterality: Left;  . patent grafts    . s/p 7    . UPPER GASTROINTESTINAL ENDOSCOPY       There were no vitals filed for this visit.  Subjective Assessment - 07/30/18 1629    Subjective  Pt. reports history of intermittent chronic right shoulder pain issues for 15-20 years of insidious onset with new exacerbation beginning about 2 months ago. Symptoms treated in the past with injections but pain returned/persisted. Symptoms include combination right upper trapezius region pain as well as right shoulder/glenohumeral pain with reaching activities. Pt. c/o right shoulder and arm feel weaker with difficulty holding arm outstretched. Denies radiating pain/radicular symptoms otherwise.    Pertinent History  51-20 year history of intermittent chronic shoulder pain issues    Limitations  Lifting;House hold activities   reaching   Diagnostic tests  X-rays    Patient Stated Goals  Get rid of pain in shoulder    Currently in Pain?  Yes    Pain Score  3     Pain Location  Shoulder    Pain Orientation  Right    Pain Descriptors / Indicators  Pressure    Pain Type  --   acute on chronic   Pain Radiating Towards  right upper trapezius region    Pain Onset  More than a month ago    Pain Frequency  Intermittent    Aggravating Factors   worse later  in day otherwise no specific aggs noted    Pain Relieving Factors  lying down on right side, rest    Effect of Pain on Daily Activities  limits ability reaching ADLs and lifting activities         [facility] PT Assessment - 07/30/18 0001      Assessment   Medical Diagnosis  Right shoulder bursitis    Referring Provider (PT)  Patsy Lager, MD    Onset Date/Surgical Date  05/29/18   estimated per pt. report   Hand Dominance  Right    Prior Therapy  none for shoulder, had past PT for neck several years ago      Precautions   Precautions  None      Restrictions   Weight Bearing Restrictions  No      Balance Screen   Has the patient fallen in the past 6 months  No      Prior Function   Level of Independence  Independent with basic  ADLs      Cognition   Overall Cognitive Status  Within Functional Limits for tasks assessed      Observation/Other Assessments   Focus on Therapeutic Outcomes (FOTO)   36% limited      ROM / Strength   AROM / PROM / Strength  AROM;PROM;Strength      AROM   AROM Assessment Site  Shoulder    Right/Left Shoulder  Right;Left    Right Shoulder Flexion  160 Degrees    Right Shoulder ABduction  180 Degrees    Right Shoulder Internal Rotation  --   reach to T6   Right Shoulder External Rotation  --   60 deg at 0 deg abd, 90 at 90 deg abd   Left Shoulder Flexion  160 Degrees    Left Shoulder ABduction  170 Degrees    Left Shoulder Internal Rotation  --   reach to T6   Left Shoulder External Rotation  --   60 deg at 0 deg abd     PROM   PROM Assessment Site  Shoulder    Right/Left Shoulder  Right    Right Shoulder Flexion  160 Degrees    Right Shoulder ABduction  180 Degrees    Right Shoulder Internal Rotation  70 Degrees    Right Shoulder External Rotation  90 Degrees      Strength   Strength Assessment Site  Shoulder;Elbow    Right/Left Shoulder  Right;Left    Right Shoulder Flexion  4+/5    Right Shoulder ABduction  4/5    Right Shoulder Internal Rotation  5/5    Right Shoulder External Rotation  4/5    Left Shoulder Flexion  5/5    Left Shoulder ABduction  5/5    Left Shoulder Internal Rotation  5/5    Left Shoulder External Rotation  5/5      Palpation   Palpation comment  Trigger points right upper + middle trapezius and levator, tightness into upper rhomboid region      Special Tests   Other special tests  (+) Empty can, mildly (+) Hawkins, (+) infraspinatus for weakness                   [facility] Adult PT Treatment/Exercise - 07/30/18 0001      Exercises   Exercises  Neck;Shoulder      Shoulder Exercises: Supine   Horizontal ABduction  Strengthening;Both;10 reps    Theraband Level (Shoulder Horizontal ABduction)  Level 3 (Green)      Shoulder  Exercises: Standing   External Rotation  Strengthening;Right;10 reps    Theraband Level (Shoulder External Rotation)  Level 2 (Red)    Internal Rotation  Strengthening;Right;10 reps    Theraband Level (Shoulder Internal Rotation)  Level 2 (Red)    Row  Strengthening;Both;10 reps    Theraband Level (Shoulder Row)  Level 3 (Green)      Neck Exercises: Stretches   Upper Trapezius Stretch  --   instructed HEP with brief practice      Trigger Point Dry Needling - 07/30/18 1643    Consent Given?  Yes    Education Handout Provided  Yes    Muscles Treated Upper Body  Upper trapezius    Upper Trapezius Response  Twitch reponse elicited           PT Education - 07/30/18 1644    Education Details  POC, HEP, dry needling, potential symptom etiology    Person(s) Educated  Patient    Methods  Explanation;Demonstration;Verbal cues;Handout    Comprehension  Verbal cues required;Returned demonstration;Verbalized understanding       PT Short Term Goals - 07/30/18 2025      PT SHORT TERM GOAL #1   Title  Independent with HEP    Baseline  no HEP    Time  4    Period  Weeks    Status  New      PT SHORT TERM GOAL #2   Title  Increase right shoulder abduction and ER strength at least 1/2 MMT grade to improve ability for lifting for chores    Baseline  4/5    Time  4    Period  Weeks    Status  New        PT Long Term Goals - 07/30/18 2026      PT LONG TERM GOAL #1   Title  Improve FOTO outcome measure score to 27% or less impairment    Baseline  36% limited    Time  8    Period  Weeks    Status  New    Target Date  09/24/18      PT LONG TERM GOAL #2   Title  Right shoulder strength grossly 5/5 to improv ability for lifting for chores    Baseline  4/5 to 4+/5    Time  8    Period  Weeks    Status  New    Target Date  09/24/18      PT LONG TERM GOAL #3   Title  Decrease right shoulder pain symptoms with reaching for dressing, chores, IADLs to 2/10 at worst    Baseline   variable but notes high pain level toward end of day/after activity    Time  8    Period  Weeks    Status  New    Target Date  09/24/18      PT LONG TERM GOAL #4   Title  Independent with advanced HEP for exercise progression to continue progress after therapy discharge    Time  8    Period  Weeks    Status  New    Target Date  09/24/18            Plan - 07/30/18 1651    Clinical Impression Statement  Pt. presents with right shoulder pain consistent with referring dx. bursitis with associated impingement. Shoulder weakness and (+) special tests could be concerning for  underying rotator cuff pathology. Pt. also presents with right upper trapezius region pain/trigger points consistent with myofascial etiology. Pt. would benefit from PT to help relieve pain and address associated functional limitations.     History and Personal Factors relevant to plan of care:  15-20 year history intermittent chronic shoulder pain issues    Clinical Presentation  Stable    Rehab Potential  Fair    PT Frequency  --   1-2x/week   PT Duration  8 weeks    PT Treatment/Interventions  ADLs/Self Care Home Management;Cryotherapy;Electrical Stimulation;Patient/family education;Dry needling;Manual techniques;Taping;Moist Heat;Neuromuscular re-education;Therapeutic exercise;Therapeutic activities;Ultrasound    PT Next Visit Plan  Progress rotator cuff and periscapular strengthening and stabilization as tolerated, upper trap stretches, STM/manual therapy, further dry needling pending response to tx. today, modalities prn    PT Home Exercise Plan  Theraband ER, IR, row, horizontal abduction, upper trap stretch    Consulted and Agree with Plan of Care  Patient       Patient will benefit from skilled therapeutic intervention in order to improve the following deficits and impairments:  Pain, Impaired UE functional use, Decreased strength  Visit Diagnosis: Chronic right shoulder pain  Muscle weakness  (generalized)     Problem List Patient Active Problem List   Diagnosis Date Noted  . Left carotid artery stenosis 10/16/2016  . Carotid artery disease (HCC) 10/05/2016  . Diplopia 05/03/2016  . 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 Unstable angina (HCC) 03/16/2016  . Coronary artery disease involving native coronary artery of native heart with unstable angina pectoris (HCC) 03/28/2011  . Hyperlipidemia 07/12/2009  . HYPERTENSION, BENIGN 07/12/2009  . CAD, ARTERY BYPASS GRAFT 07/12/2009  . CHEST PAIN-UNSPECIFIED 06/30/2009   Lazarus Gowda, PT, DPT 07/30/18 8:33 PM  [facility] [address] [place], [place], [address] Phone: [phone]   Fax:  [phone]  Name: Arnell Slivinski MRN: [id number] Date of Birth: 1956-05-29

## 2018-08-01 NOTE — Addendum Note (Signed)
Addended by: Lazarus Gowda S on: 08/01/2018 01:05 PM   Modules accepted: Orders

## 2018-08-07 ENCOUNTER — Encounter: Payer: Self-pay | Admitting: Physical Therapy

## 2018-08-07 ENCOUNTER — Ambulatory Visit: Payer: 59 | Admitting: Physical Therapy

## 2018-08-07 DIAGNOSIS — M6281 Muscle weakness (generalized): Secondary | ICD-10-CM

## 2018-08-07 DIAGNOSIS — M25511 Pain in right shoulder: Principal | ICD-10-CM

## 2018-08-07 DIAGNOSIS — G8929 Other chronic pain: Secondary | ICD-10-CM

## 2018-08-07 NOTE — Therapy (Signed)
[facility] [address] [place], [place], [address] Phone: [phone]   Fax:  [phone]  Physical Therapy Treatment  Patient Details  Name: [name] Sosa MRN: [id number] Date of Birth: [birth year] Referring Provider (PT): Patsy Lager, MD   Encounter Date: 08/07/2018  PT End of Session - 08/07/18 1243    Visit Number  2    Number of Visits  16    Date for PT Re-Evaluation  09/24/18    Authorization Type  Aetna    PT Start Time  1146    PT Stop Time  1233    PT Time Calculation (min)  47 min    Activity Tolerance  Patient tolerated treatment well    Behavior During Therapy  [facility] for tasks assessed/performed       Past Medical History:  Diagnosis Date  . CAD (coronary artery disease)    s/p 7 vessel CABG 2000 at [facility]  . Carotid artery occlusion   . High cholesterol   . Hyperlipidemia   . Hypertension   . Pre-diabetes    < 6.1 2017  . Stroke (HCC) 04/2016   TIA- 04/2016 double vision 2nd - speech - no residual  . 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 4v cabg    . CARDIAC CATHETERIZATION    . CARDIAC CATHETERIZATION N/A 03/23/2016   Procedure: Left Heart Cath and Cors/Grafts Angiography;  Surgeon: Kathleene Hazel, MD;  [facility] CV LAB;  Service: Cardiovascular;  Laterality: N/A;  . CAROTID ANGIOGRAPHY N/A 10/11/2016   Procedure: Carotid Angiography / Cerebral angiogram;  Surgeon: Fransisco Hertz, MD;  Location: [facility] INVASIVE CV LAB;  Service: Cardiovascular;  Laterality: N/A;  . CAROTID ENDARTERECTOMY    . COLONOSCOPY  2017  . COLONOSCOPY W/ POLYPECTOMY    . CORONARY ARTERY BYPASS GRAFT    . ENDARTERECTOMY Left 10/16/2016   Procedure: LEFT CAROTID ENDARTERECTOMY WITH PATCH ANGIOPLASTY;  Surgeon: Fransisco Hertz, MD;  Location: [facility] OR;  Service: Vascular;  Laterality: Left;  . patent grafts    . s/p 7    . UPPER GASTROINTESTINAL ENDOSCOPY       There were no vitals filed for this visit.  Subjective Assessment - 08/07/18 1149    Subjective  Pt. reports was sore the day after tx. at eval but 2 days later noted benefit with improved upper trapezius pain. He reports some posterior scapular region pain with ER exercise with band.                       [facility] Adult PT Treatment/Exercise - 08/07/18 0001      Shoulder Exercises: Sidelying   External Rotation  AROM;Strengthening;Right;20 reps    External Rotation Weight (lbs)  1      Shoulder Exercises: Standing   Internal Rotation  Strengthening;Right;20 reps    Theraband Level (Shoulder Internal Rotation)  Level 3 (Green)    Extension  Strengthening;Both;20 reps    Theraband Level (Shoulder Extension)  Level 3 (Green)    Row  Strengthening;Both;20 reps    Theraband Level (Shoulder Row)  Level 4 (Blue)    Other Standing Exercises  [place] flexion red 2x10 right side      Manual Therapy   Manual Therapy  Soft tissue mobilization    Soft tissue mobilization  STM right upper trapezius and levator region in sitting, STM right posterior scapular region in sidelyin  Neck Exercises: Stretches   Upper Trapezius Stretch  Right;2 reps;30 seconds   self stretch-reviewed HEP form      Trigger Point Dry Needling - 08/07/18 1242    Consent Given?  Yes    Muscles Treated Upper Body  Upper trapezius;Supraspinatus;Infraspinatus   incl. right tricep          PT Education - 08/07/18 1243    Education Details  exercise form, muscular/trigger point soreness vs. rotator cuff/impingement associated pain    Person(s) Educated  Patient    Methods  Explanation;Demonstration;Verbal cues;Tactile cues    Comprehension  Verbalized understanding;Returned demonstration;Verbal cues required;Tactile cues required       PT Short Term Goals - 07/30/18 2025      PT SHORT TERM GOAL #1   Title  Independent with HEP    Baseline  no HEP    Time  4    Period  Weeks     Status  New      PT SHORT TERM GOAL #2   Title  Increase right shoulder abduction and ER strength at least 1/2 MMT grade to improve ability for lifting for chores    Baseline  4/5    Time  4    Period  Weeks    Status  New        PT Long Term Goals - 07/30/18 2026      PT LONG TERM GOAL #1   Title  Improve FOTO outcome measure score to 27% or less impairment    Baseline  36% limited    Time  8    Period  Weeks    Status  New    Target Date  09/24/18      PT LONG TERM GOAL #2   Title  Right shoulder strength grossly 5/5 to improv ability for lifting for chores    Baseline  4/5 to 4+/5    Time  8    Period  Weeks    Status  New    Target Date  09/24/18      PT LONG TERM GOAL #3   Title  Decrease right shoulder pain symptoms with reaching for dressing, chores, IADLs to 2/10 at worst    Baseline  variable but notes high pain level toward end of day/after activity    Time  8    Period  Weeks    Status  New    Target Date  09/24/18      PT LONG TERM GOAL #4   Title  Independent with advanced HEP for exercise progression to continue progress after therapy discharge    Time  8    Period  Weeks    Status  New    Target Date  09/24/18            Plan - 08/07/18 1244    Clinical Impression Statement  Discomfort associated with ER exercise as noted in subjective upon further questioning and demonstration appeared myofascial and associated with upper trapezius and periscapular region rather than subacromial region pain. Progressed rotator cuff and periscapular strengthening but more focus manual therapy today with STM to address myofascial pain, also expanded dry needling to include posterior scapular region and tricep. Good response to initial tx. to address pain but given chronic nature of symptoms expect progress will be gradual and ongoing.    Clinical Presentation  Stable    Rehab Potential  Fair    PT Frequency  --   1-2x/week  PT Duration  8 weeks    PT  Treatment/Interventions  ADLs/Self Care Home Management;Cryotherapy;Electrical Stimulation;Patient/family education;Dry needling;Manual techniques;Taping;Moist Heat;Neuromuscular re-education;Therapeutic exercise;Therapeutic activities;Ultrasound    PT Next Visit Plan  Progress rotator cuff and periscapular strengthening and stabilization as tolerated, upper trap stretches, STM/manual therapy, further dry needling prn, modalities prn    PT Home Exercise Plan  Theraband ER, IR, row, horizontal abduction, upper trap stretch (held ER with band today and instructed sidelying ER with soup can/water bottle as alternative)    Consulted and Agree with Plan of Care  Patient       Patient will benefit from skilled therapeutic intervention in order to improve the following deficits and impairments:  Pain, Impaired UE functional use, Decreased strength  Visit Diagnosis: Chronic right shoulder pain  Muscle weakness (generalized)     Problem List Patient Active Problem List   Diagnosis Date Noted  . Left carotid artery stenosis 10/16/2016  . Carotid artery disease (HCC) 10/05/2016  . Diplopia 05/03/2016  . 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 Unstable angina (HCC) 03/16/2016  . Coronary artery disease involving native coronary artery of native heart with unstable angina pectoris (HCC) 03/28/2011  . Hyperlipidemia 07/12/2009  . HYPERTENSION, BENIGN 07/12/2009  . CAD, ARTERY BYPASS GRAFT 07/12/2009  . CHEST PAIN-UNSPECIFIED 06/30/2009    Lazarus Gowdahristopher Zoch, PT, DPT 08/07/18 12:50 PM  [facility] [address] [place], [place], [address] Phone: [phone]   Fax:  [phone]  Name: [name] PerchesSeyed Sosa MRN: [id number] Date of Birth: 09/14/1955

## 2018-08-12 ENCOUNTER — Encounter: Payer: Self-pay | Admitting: Physical Therapy

## 2018-08-12 ENCOUNTER — Ambulatory Visit: Payer: 59 | Admitting: Physical Therapy

## 2018-08-12 DIAGNOSIS — G8929 Other chronic pain: Secondary | ICD-10-CM

## 2018-08-12 DIAGNOSIS — M25511 Pain in right shoulder: Secondary | ICD-10-CM | POA: Diagnosis not present

## 2018-08-12 DIAGNOSIS — M6281 Muscle weakness (generalized): Secondary | ICD-10-CM

## 2018-08-12 NOTE — Therapy (Signed)
[place], [place], [address] Phone: [phone]   Fax:  [phone]  Physical Therapy Treatment  Patient Details  Name: [name] Sosa MRN: [id number] Date of Birth: Oct 11, 1955 Referring Provider (PT): Lennox Solders, MD   Encounter Date: 08/12/2018  PT End of Session - 08/12/18 1056    Visit Number  3    Number of Visits  16    Date for PT Re-Evaluation  09/24/18    Authorization Type  [employer]et[employer]    PT Start Time  1008    PT Stop Time  1055    PT Time Calculation (min)  47 min    Activity Tolerance  --   intermittent c/o spasms in right arm with exercises   Behavior During Therapy  [facility] for tasks assessed/performed       Past Medical History:  Diagnosis Date  . CAD (coronary artery disease)    s/p 7 vessel CABG 2000 at [facility]  . Carotid artery occlusion   . High cholesterol   . Hyperlipidemia   . Hypertension   . Pre-diabetes    < 6.1 2017  . Stroke ([place]) 04/2016   TIA- 04/2016 double vision 2nd - speech - no residual  . 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 4v cabg    . CARDIAC CATHETERIZATION    . CARDIAC CATHETERIZATION N/A 03/23/2016   Procedure: Left Heart Cath and Cors/Grafts Angiography;  Surgeon: Burnell Blanks, MD;  Location: [place] [facility];  Service: Cardiovascular;  Laterality: N/A;  . CAROTID ANGIOGRAPHY N/A 10/11/2016   Procedure: Carotid Angiography / Cerebral angiogram;  Surgeon: Conrad Lo[place], MD;  Location: [place] [facility];  Service: Cardiovascular;  Laterality: N/A;  . CAROTID ENDARTERECTOMY    . COLONOSCOPY  2017  . COLONOSCOPY W/ POLYPECTOMY    . CORONARY ARTERY BYPASS GRAFT    . ENDARTERECTOMY Left 10/16/2016   Procedure: LEFT CAROTID ENDARTERECTOMY WITH PATCH ANGIOPLASTY;  Surgeon: Conrad [place], MD;  Location: [place];  Service: Vascular;  Laterality: Left;  . patent grafts    . s/p 7    . UPPER  GASTROINTESTINAL ENDOSCOPY      There were no vitals filed for this visit.  Subjective Assessment - 08/12/18 1012    Subjective  Pt. reports "did not go as well" after last tx. session with continued pain. He notes continued issues with right tricep region spasm, primary pain is in left upper trapezius and posterior scapular region,    Currently in Pain?  Yes    Pain Score  4     Pain Location  Shoulder    Pain Orientation  Right    Pain Descriptors / Indicators  Pressure    Pain Type  --   acute on chronic   Pain Radiating Towards  right upper trapezius and posterior scapular region.    Pain Onset  More than a month ago    Pain Frequency  Intermittent    Aggravating Factors   activity, worse later in day    Pain Relieving Factors  rest         [facility] PT Assessment - 08/12/18 0001      Strength   Right Shoulder Flexion  4+/5    Right Shoulder ABduction  4/5    Right Shoulder Internal Rotation  5/5    Right Shoulder External Rotation  4+/5  [place] [facility] Adult PT Treatment/Exercise - 08/12/18 0001      Neck Exercises: Seated   Neck Retraction  10 reps    Other Seated Exercise  cervical fleixon x 10      Neck Exercises: Supine   Neck Retraction  15 reps    Neck Retraction Limitations  with therapist assist    Other Supine Exercise  supine assisted flexion x 15 reps      Shoulder Exercises: Standing   Internal Rotation  Strengthening;Right;20 reps    Theraband Level (Shoulder Internal Rotation)  Level 3 (Green)    Other Standing Exercises  Rockwood flexion green band 2x10, Rockwood abduction red band x 10      Shoulder Exercises: Isometric Strengthening   External Rotation  --   10 x 5 sec with small ball     Manual Therapy   Manual Therapy  Neural Stretch    Manual therapy comments  STM right and middle trapezius and infraspinatus, gentle cervical manual traction    Neural Stretch  Right radial nerve glides      Neck Exercises: Stretches    Upper Trapezius Stretch  Right;3 reps;30 seconds   supine manual stretch      Trigger Point Dry Needling - 08/12/18 1104    Consent Given?  Yes    Muscles Treated Upper Body  Upper trapezius;Infraspinatus    Upper Trapezius Response  Twitch reponse elicited    Infraspinatus Response  Palpable increased muscle length           PT Education - 08/12/18 1128    Education Details  HEP updates, potential symptom etiology    Person(s) Educated  Patient    Methods  Explanation;Demonstration;Tactile cues;Verbal cues    Comprehension  Verbalized understanding;Returned demonstration       PT Short Term Goals - 08/12/18 1243      PT SHORT TERM GOAL #1   Title  Independent with HEP    Baseline  met for initial HEP, updated today    Time  4    Period  Weeks    Status  On-going      PT SHORT TERM GOAL #2   Title  Increase right shoulder abduction and ER strength at least 1/2 MMT grade to improve ability for lifting for chores    Baseline  met for ER, not met for abduction    Time  4    Period  Weeks    Status  Partially Met        PT Long Term Goals - 08/12/18 1242      PT LONG TERM GOAL #1   Title  Improve FOTO outcome measure score to 27% or less impairment    Baseline  36% limited at eval, not retested today    Time  8    Period  Weeks    Status  On-going      PT LONG TERM GOAL #2   Title  Right shoulder strength grossly 5/5 to improv ability for lifting for chores    Baseline  4/5 to 4+/5-see flowsheet    Time  8    Period  Weeks    Status  On-going      PT LONG TERM GOAL #3   Title  Decrease right shoulder pain symptoms with reaching for dressing, chores, IADLs to 2/10 at worst    Baseline  4/10 pain today    Time  8    Period  Weeks    Status  On-going      PT LONG TERM GOAL #4   Title  Independent with advanced HEP for exercise progression to continue progress after therapy discharge    Baseline  HEP updates ongoing    Time  8    Period  Weeks    Status   On-going            Plan - 08/12/18 1129    Clinical Impression Statement  Some setback in status with soreness as noted in subjective. Differential dx. for right posterior arm pain and periscapular synmptoms could include cervical radiculopathy so included trial flexion bias exercises to address (briefly able to centralize arm pain with performance in session). Pt.'s symptoms present as multifactorial with rotator cuff/impingement pain, right upper trapezius and periscapular myofascial pain along with potential radicular symptoms.       Patient will benefit from skilled therapeutic intervention in order to improve the following deficits and impairments:     Visit Diagnosis: Chronic right shoulder pain  Muscle weakness (generalized)     Problem List Patient Active Problem List   Diagnosis Date Noted  . Left carotid artery stenosis 10/16/2016  . Carotid artery disease ([place]) 10/05/2016  . Diplopia 05/03/2016  . 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 Unstable angina ([place]) 03/16/2016  . Coronary artery disease involving native coronary artery of native heart with unstable angina pectoris ([place]) 03/28/2011  . Hyperlipidemia 07/12/2009  . HYPERTENSION, BENIGN 07/12/2009  . CAD, ARTERY BYPASS GRAFT 07/12/2009  . CHEST PAIN-UNSPECIFIED 06/30/2009    Beaulah Dinning, PT, DPT 08/12/18 12:44 PM  [facility] [address] [place], [place], [address] Phone: [phone]   Fax:  [phone]  Name: [name] Sosa MRN: [id number] Date of Birth: [birth year]

## 2018-08-18 ENCOUNTER — Encounter: Payer: Self-pay | Admitting: Physical Therapy

## 2018-08-18 ENCOUNTER — Telehealth: Payer: Self-pay | Admitting: Physical Therapy

## 2018-08-18 ENCOUNTER — Ambulatory Visit: Payer: 59 | Attending: Orthopedic Surgery | Admitting: Physical Therapy

## 2018-08-18 DIAGNOSIS — M25511 Pain in right shoulder: Secondary | ICD-10-CM | POA: Insufficient documentation

## 2018-08-18 DIAGNOSIS — M542 Cervicalgia: Secondary | ICD-10-CM | POA: Insufficient documentation

## 2018-08-18 DIAGNOSIS — G8929 Other chronic pain: Secondary | ICD-10-CM | POA: Diagnosis present

## 2018-08-18 DIAGNOSIS — M6281 Muscle weakness (generalized): Secondary | ICD-10-CM | POA: Diagnosis present

## 2018-08-18 NOTE — Telephone Encounter (Signed)
Called Dr. Hinda Glatter office and left message to request therapy prescription to include pt.'s neck along with shoulder therapy.

## 2018-08-18 NOTE — Therapy (Signed)
[place], [place], [address] Phone: [phone]   Fax:  [phone]  Physical Therapy Treatment  Patient Details  Name: [name] Sosa MRN: [id number] Date of Birth: Sep 16, 1955 Referring Provider (PT): Lennox Solders, MD   Encounter Date: 08/18/2018  PT End of Session - 08/18/18 1255    Visit Number  4    Number of Visits  16    Date for PT Re-Evaluation  09/24/18    Authorization Type  Aetna    PT Start Time  1100    PT Stop Time  1146    PT Time Calculation (min)  46 min    Activity Tolerance  Patient tolerated treatment well    Behavior During Therapy  [facility] for tasks assessed/performed       Past Medical History:  Diagnosis Date  . CAD (coronary artery disease)    s/p 7 vessel CABG 2000 at [facility]  . Carotid artery occlusion   . High cholesterol   . Hyperlipidemia   . Hypertension   . Pre-diabetes    < 6.1 2017  . Stroke ([place]) 04/2016   TIA- 04/2016 double vision 2nd - speech - no residual  . 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 4v cabg    . CARDIAC CATHETERIZATION    . CARDIAC CATHETERIZATION N/A 03/23/2016   Procedure: Left Heart Cath and Cors/Grafts Angiography;  Surgeon: Burnell Blanks, MD;  Location: [place] [facility];  Service: Cardiovascular;  Laterality: N/A;  . CAROTID ANGIOGRAPHY N/A 10/11/2016   Procedure: Carotid Angiography / Cerebral angiogram;  Surgeon: Conrad Shannon, MD;  Location: [place] [facility];  Service: Cardiovascular;  Laterality: N/A;  . CAROTID ENDARTERECTOMY    . COLONOSCOPY  2017  . COLONOSCOPY W/ POLYPECTOMY    . CORONARY ARTERY BYPASS GRAFT    . ENDARTERECTOMY Left 10/16/2016   Procedure: LEFT CAROTID ENDARTERECTOMY WITH PATCH ANGIOPLASTY;  Surgeon: Conrad Edwards[place], MD;  Location: [place];  Service: Vascular;  Laterality: Left;  . patent grafts    . s/p 7    . UPPER GASTROINTESTINAL ENDOSCOPY       There were no vitals filed for this visit.  Subjective Assessment - 08/18/18 1059    Subjective  Pt. reports therapy has helped but still noting symptoms of "cramping" pain intermittently into right proximal arm and radiating into right upper scapular region. Notes tendency for shoulder/arm to feel like it will "give way" when holding arm outstretched/carrying objects. Pt. reports some relief of arm symptoms with cervical flexion motion. Pt. brought in home TENS unit-he reports tried to use this on shoulder but was painful.    Pertinent History  15-20 year history of intermittent chronic shoulder pain issues    Diagnostic tests  X-rays    Patient Stated Goals  Get rid of pain in shoulder    Currently in Pain?  Yes    Pain Score  3     Pain Location  Shoulder    Pain Orientation  Right    Pain Descriptors / Indicators  Pressure    Pain Type  --   acute on chronic   Pain Radiating Towards  right proximal arm and superior scapular region    Pain Onset  More than a month ago    Pain Frequency  Intermittent    Aggravating Factors   worse later in day, activity    Pain Relieving Factors  rest but has pain at rest, sleep disturbance as well.    Effect of Pain on Daily Activities  limits ability reaching ADLs and lifting activities         [facility] PT Assessment - 08/18/18 0001      Strength   Right Shoulder Flexion  5/5    Right Shoulder ABduction  4+/5    Right Shoulder Internal Rotation  5/5    Right Shoulder External Rotation  4/5                   [facility] Adult PT Treatment/Exercise - 08/18/18 0001      Shoulder Exercises: Supine   Horizontal ABduction  Strengthening;Both;15 reps      Shoulder Exercises: Sidelying   External Rotation  AROM;Strengthening;Right;20 reps    External Rotation Weight (lbs)  1    ABduction  --   scaption to 90 deg 1 lb. 2x10     Shoulder Exercises: Standing   Internal Rotation  Strengthening;Right;20 reps    Theraband Level (Shoulder  Internal Rotation)  Level 4 (Blue)    Flexion  --   Rockwood flexion green band 2x10   ABduction  --   [place]wood abduction red band 2x10     Shoulder Exercises: Isometric Strengthening   External Rotation  --   at wall with ball 15 reps x 3-5 sec holds, right side     Manual Therapy   Manual Therapy  Soft tissue mobilization;Myofascial release    Soft tissue mobilization  STM right upper trapezius and levator    Myofascial Release  right upper trapezius      Neck Exercises: Stretches   Upper Trapezius Stretch  Right;3 reps;30 seconds   supine manual stretch   Levator Stretch  Right;3 reps;30 seconds   supine manual stretch   Other Neck Stretches  supine manual pec minor stretch 3x30 sec       Trigger Point Dry Needling - 08/18/18 0001    Consent Given?  Yes    Muscles Treated Head and Neck  Upper trapezius;Levator scapulae    Muscles Treated Upper Quadrant  Infraspinatus    Upper Trapezius Response  Twitch reponse elicited    Levator Scapulae Response  Twitch response elicited           PT Education - 08/18/18 1253    Education Details  POC, potential symptom etiology, follow up with MD if cervical symptoms persist for assessment, exercise form, sleeping position, TENS parameters    Person(s) Educated  Patient    Methods  Explanation;Demonstration;Verbal cues;Tactile cues    Comprehension  Verbalized understanding;Returned demonstration       PT Short Term Goals - 08/18/18 1302      PT SHORT TERM GOAL #1   Title  Independent with HEP    Baseline  met, will continue to update prn    Time  4    Period  Weeks    Status  Achieved      PT SHORT TERM GOAL #2   Title  Increase right shoulder abduction and ER strength at least 1/2 MMT grade to improve ability for lifting for chores    Baseline  ER 4/5, abd 4+/5    Time  4    Period  Weeks    Status  Partially Met        PT Long Term Goals - 08/18/18 1303      PT LONG TERM GOAL #1   Title  Improve FOTO outcome  measure score to 27% or less impairment    Baseline  36% limited at eval, not retested today    Time  8    Period  Weeks    Status  On-going      PT LONG TERM GOAL #2   Title  Right shoulder strength grossly 5/5 to improv ability for lifting for chores    Baseline  flex 5/5, abd 4+/5, ER 4/5, IR 5/5    Time  8    Period  Weeks    Status  On-going      PT LONG TERM GOAL #3   Title  Decrease right shoulder pain symptoms with reaching for dressing, chores, IADLs to 2/10 at worst    Baseline  3/10 pain today for AM appointment, reports pain worse in evening and after prolonged activity    Time  8    Period  Weeks    Status  On-going      PT LONG TERM GOAL #4   Title  Independent with advanced HEP for exercise progression to continue progress after therapy discharge    Baseline  HEP updates ongoing    Time  8    Period  Weeks    Status  On-going            Plan - 08/18/18 1256    Clinical Impression Statement  Some improvement with right shoulder flexion and abduction strength but continues with weakness in ER which could be consistent with underlying rotator cuff pathology. Pt. also continues with radiating pain into right proximal arm and scapular region-this could be associated with shoulder but differential diagnosis could also include cervical etiology particularly given that some relief is noted with cervical flexion ROM (could suggest stenosis). Pt. has past history of cervical radiculopathy which he reports was around 2003 and improved at the time with therapy including traction use-plan request PT Rx. from MD to include cervical region for possible trial mechanical traction to see if this can help right UE radiating symptoms.    Stability/Clinical Decision Making  Evolving/Moderate complexity    Clinical Decision Making  Moderate   due to differential diagnosis shoulder vs. cervical symptoms   Rehab Potential  Fair    PT Frequency  --   1-2x/week   PT Duration  8 weeks     PT Treatment/Interventions  ADLs/Self Care Home Management;Cryotherapy;Electrical Stimulation;Patient/family education;Dry needling;Manual techniques;Taping;Moist Heat;Neuromuscular re-education;Therapeutic exercise;Therapeutic activities;Ultrasound    PT Next Visit Plan  Monitor right UE radiating symptoms, if new PT Rx. received to include neck then re-eval for neck and add potential trial cervical mechanical traction, continue rotator cuff and periscapular strengthening, stretches/ROM, manual therapy, further dry needling prn    PT Home Exercise Plan  Theraband IR, row, horizontal abduction, upper trap stretch, ER isometric vs. sidelying ER AROM with light weight (band too painful for ER)    Consulted and Agree with Plan of Care  Patient       Patient will benefit from skilled therapeutic intervention in order to improve the following deficits and impairments:  Pain, Impaired UE functional use, Decreased strength  Visit Diagnosis: Chronic right shoulder pain  Muscle weakness (generalized)     Problem List Patient Active Problem List   Diagnosis Date Noted  . Left carotid artery stenosis 10/16/2016  . Carotid artery disease ([place]) 10/05/2016  . Diplopia 05/03/2016  . 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 Unstable angina ([place]) 03/16/2016  . Coronary artery disease involving native coronary artery of native  heart with unstable angina pectoris ([place]) 03/28/2011  . Hyperlipidemia 07/12/2009  . HYPERTENSION, BENIGN 07/12/2009  . CAD, ARTERY BYPASS GRAFT 07/12/2009  . CHEST PAIN-UNSPECIFIED 06/30/2009    Beaulah Dinning, PT, DPT 08/18/18 1:06 PM  [facility] [address] [place], [place], [address] Phone: [phone]   Fax:  [phone]  Name: [name] Sosa MRN: [id number] Date of Birth: [birth year]

## 2018-08-21 ENCOUNTER — Encounter: Payer: 59 | Admitting: Physical Therapy

## 2018-08-26 ENCOUNTER — Ambulatory Visit: Payer: 59 | Admitting: Physical Therapy

## 2018-08-27 ENCOUNTER — Encounter: Payer: 59 | Admitting: Physical Therapy

## 2018-09-04 ENCOUNTER — Ambulatory Visit: Payer: 59 | Admitting: Physical Therapy

## 2018-09-04 ENCOUNTER — Other Ambulatory Visit: Payer: Self-pay

## 2018-09-04 ENCOUNTER — Encounter: Payer: Self-pay | Admitting: Physical Therapy

## 2018-09-04 DIAGNOSIS — M6281 Muscle weakness (generalized): Secondary | ICD-10-CM

## 2018-09-04 DIAGNOSIS — M542 Cervicalgia: Secondary | ICD-10-CM

## 2018-09-04 DIAGNOSIS — G8929 Other chronic pain: Secondary | ICD-10-CM

## 2018-09-04 DIAGNOSIS — M25511 Pain in right shoulder: Secondary | ICD-10-CM

## 2018-09-04 NOTE — Therapy (Signed)
[place], [place], [address] Phone: [phone]   Fax:  [phone]  Physical Therapy Treatment/Re-evaluation  Patient Details  Name: [name] Sosa MRN: [id number] Date of Birth: Apr 17, 1956 Referring Provider (PT): Lennox Solders, MD   Encounter Date: 09/04/2018  PT End of Session - 09/04/18 1109    Visit Number  5    Number of Visits  21    Date for PT Re-Evaluation  10/30/18    Authorization Type  Aetna    PT Start Time  5188    PT Stop Time  1104    PT Time Calculation (min)  49 min    Activity Tolerance  Patient tolerated treatment well    Behavior During Therapy  [facility] for tasks assessed/performed       Past Medical History:  Diagnosis Date  . CAD (coronary artery disease)    s/p 7 vessel CABG 2000 at [facility]  . Carotid artery occlusion   . High cholesterol   . Hyperlipidemia   . Hypertension   . Pre-diabetes    < 6.1 2017  . Stroke ([place]) 04/2016   TIA- 04/2016 double vision 2nd - speech - no residual  . 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 4v cabg    . CARDIAC CATHETERIZATION    . CARDIAC CATHETERIZATION N/A 03/23/2016   Procedure: Left Heart Cath and Cors/Grafts Angiography;  Surgeon: Burnell Blanks, MD;  Location: [place] [facility];  Service: Cardiovascular;  Laterality: N/A;  . CAROTID ANGIOGRAPHY N/A 10/11/2016   Procedure: Carotid Angiography / Cerebral angiogram;  Surgeon: Conrad [place], MD;  Location: [place] [facility];  Service: Cardiovascular;  Laterality: N/A;  . CAROTID ENDARTERECTOMY    . COLONOSCOPY  2017  . COLONOSCOPY W/ POLYPECTOMY    . CORONARY ARTERY BYPASS GRAFT    . ENDARTERECTOMY Left 10/16/2016   Procedure: LEFT CAROTID ENDARTERECTOMY WITH PATCH ANGIOPLASTY;  Surgeon: Conrad , MD;  Location: [place];  Service: Vascular;  Laterality: Left;  . patent grafts    . s/p 7    . UPPER GASTROINTESTINAL  ENDOSCOPY      There were no vitals filed for this visit.  Subjective Assessment - 09/04/18 1018    Subjective  Pt. returns, not seen for therapy since 08/18/18 with therapy missed last week due to upper respiratory illness. New therapy received from MD to include neck/traction with therapy. Pt. reports right shoulder pain improved around 60% from baseline. No pain at rest pre-tx. but still with intermittent pain with reaching/activity and notes c/o weakness. Pt. continues to have intermittent pain into rigth proximal arm with "cramping" in muscles.    Pertinent History  15-20 year history of intermittent chronic shoulder pain issues    Limitations  Lifting;House hold activities    Diagnostic tests  X-rays    Patient Stated Goals  Get rid of pain in shoulder    Currently in Pain?  No/denies    Pain Score  0-No pain         [facility] PT Assessment - 09/04/18 0001      Observation/Other Assessments   Focus on Therapeutic Outcomes (FOTO)   38% limited      Sensation   Additional Comments  Sensation to light touch grossly intact at bilat. C5-T1 dermatomes      AROM   AROM Assessment Site  Cervical    Right Shoulder Flexion  165 Degrees  Right Shoulder ABduction  170 Degrees    Right Shoulder Internal Rotation  --   reach to T6   Right Shoulder External Rotation  60 Degrees   at 0 deg abd   Left Shoulder Flexion  168 Degrees    Left Shoulder ABduction  160 Degrees    Left Shoulder Internal Rotation  --   reach to T6   Left Shoulder External Rotation  60 Degrees   at 0 deg abd   Cervical Flexion  50    Cervical Extension  23    Cervical - Right Side Bend  34    Cervical - Left Side Bend  23    Cervical - Right Rotation  48    Cervical - Left Rotation  45      Strength   Strength Assessment Site  Wrist    Right Shoulder Flexion  5/5    Right Shoulder ABduction  4+/5    Right Shoulder Internal Rotation  5/5    Right Shoulder External Rotation  4/5    Left Shoulder Flexion  5/5     Left Shoulder ABduction  5/5    Left Shoulder Internal Rotation  5/5    Left Shoulder External Rotation  4+/5    Right/Left Elbow  Right;Left    Right Elbow Flexion  4+/5    Right Elbow Extension  5/5    Left Elbow Flexion  5/5    Left Elbow Extension  5/5    Right/Left Wrist  Right;Left    Right Wrist Flexion  4/5    Right Wrist Extension  4+/5    Left Wrist Flexion  4+/5    Left Wrist Extension  5/5      Special Tests   Other special tests  Pt. reports pain into right scapular region with Spurling's but no distal right UE symptoms reproduced                   [facility] Adult PT Treatment/Exercise - 09/04/18 0001      Neck Exercises: Supine   Neck Retraction  15 reps    Neck Retraction Limitations  with therapist assist, also reviewed form seated retractions    Other Supine Exercise  supine retraction to assisted flexion x 15 reps      Shoulder Exercises: Standing   Other Standing Exercises  Brief HEP review shoulder Theraband exercises-added [place] abduction and performed x 10 reps with red band      Modalities   Modalities  Traction      Traction   Type of Traction  Cervical    Min (lbs)  12    Max (lbs)  20   started with 10 lb. but too light per pt./unable to feel   Hold Time  60 sec    Rest Time  20 sec    Time  10 minutes      Manual Therapy   Manual Therapy  Soft tissue mobilization    Soft tissue mobilization  STM right levator and upper trapezius      Neck Exercises: Stretches   Upper Trapezius Stretch  Right;3 reps;30 seconds   supine manual stretch   Levator Stretch  Right;3 reps;30 seconds   supine manual stretch      Trigger Point Dry Needling - 09/04/18 0001    Consent Given?  Yes    Muscles Treated Head and Neck  Upper trapezius;Levator scapulae    Dry Needling Comments  right side treated with 32  gauge 30 mm needles in left sidelying    Upper Trapezius Response  Twitch reponse elicited    Levator Scapulae Response  Twitch  response elicited           PT Education - 09/04/18 1109    Education Details  HEP updates/review, POC, traction    Person(s) Educated  Patient    Methods  Explanation;Demonstration;Verbal cues    Comprehension  Verbalized understanding;Returned demonstration       PT Short Term Goals - 09/04/18 1115      PT SHORT TERM GOAL #1   Title  Independent with HEP    Baseline  updated today    Time  4    Period  Weeks    Status  On-going      PT SHORT TERM GOAL #2   Title  Increase right shoulder abduction and ER strength at least 1/2 MMT grade to improve ability for lifting for chores    Baseline  ER 4/5, abd 4+/5    Time  4    Period  Weeks    Status  Partially Met        PT Long Term Goals - 09/04/18 1116      PT LONG TERM GOAL #1   Title  Improve FOTO outcome measure score to 27% or less impairment    Baseline  38% limited    Time  8    Period  Weeks    Status  On-going    Target Date  10/30/18      PT LONG TERM GOAL #2   Title  Right shoulder and elbow + wrist strength grossly 5/5 to improv ability for lifting for chores and work duties    Baseline  see flowsheet, updated to include arm/wrist    Time  8    Period  Weeks    Status  Revised    Target Date  10/30/18      PT LONG TERM GOAL #3   Title  Decrease right shoulder + arm pain symptoms with reaching for dressing, chores, IADLs to 2/10 at worst    Baseline  Improving but pain intermittently higher, revised to include arm    Time  8    Period  Weeks    Status  Revised    Target Date  10/30/18      PT LONG TERM GOAL #4   Title  Independent with advanced HEP for exercise progression to continue progress after therapy discharge    Baseline  HEP updates ongoing    Time  8    Period  Weeks    Status  On-going    Target Date  10/30/18      PT LONG TERM GOAL #5   Title  Increase cervical rotation AROM at least 10 deg bilat. to improve ability to turn head while driving    Baseline  right 48 dge, left 45  deg    Time  8    Period  Weeks    Status  New    Target Date  10/30/18            Plan - 09/04/18 1110    Clinical Impression Statement  For shoulder pt. improving from baseline but still with pain in abduction consistent with impingement syndrome also with rotator cuff weakness most notably in ER which could be concerning for underlying rotator cuff pathology. Re-eval today to include neck-pt. presents with flexion bias ROM with right proximal arm pain which  could be radicular (also has PMH radiculopathy previously improved with therapy, traction). Differential diagnosis for periscapular pain could be myofascial vs. radicular pattern. Pt. also with weakness noted in righ elbow and wrist which could be radicular. Pt. woule benefit from PT to help relieve pain and address current associated functional limitations.    Stability/Clinical Decision Making  Evolving/Moderate complexity    Clinical Decision Making  Moderate    Rehab Potential  Fair    PT Frequency  --   1-2x/week   PT Duration  8 weeks    PT Treatment/Interventions  ADLs/Self Care Home Management;Cryotherapy;Electrical Stimulation;Patient/family education;Dry needling;Manual techniques;Taping;Moist Heat;Neuromuscular re-education;Therapeutic exercise;Therapeutic activities;Ultrasound;Traction    PT Next Visit Plan  Check response traction and continue as found beneficial, continue STM and further dry needling right levator, upper trap region, rotator cuff/periscapular and shoulder strengthening    PT Home Exercise Plan  Theraband IR, row, horizontal abduction, upper trap stretch, ER isometric vs. sidelying ER AROM with light weight (band too painful for ER), [place] abduction, cervical retractions and flexion AROM    Consulted and Agree with Plan of Care  Patient       Patient will benefit from skilled therapeutic intervention in order to improve the following deficits and impairments:  Pain, Impaired UE functional use,  Decreased strength  Visit Diagnosis: Cervicalgia  Chronic right shoulder pain  Muscle weakness (generalized)     Problem List Patient Active Problem List   Diagnosis Date Noted  . Left carotid artery stenosis 10/16/2016  . Carotid artery disease ([place]) 10/05/2016  . Diplopia 05/03/2016  . 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 Unstable angina ([place]) 03/16/2016  . Coronary artery disease involving native coronary artery of native heart with unstable angina pectoris ([place]) 03/28/2011  . Hyperlipidemia 07/12/2009  . HYPERTENSION, BENIGN 07/12/2009  . CAD, ARTERY BYPASS GRAFT 07/12/2009  . CHEST PAIN-UNSPECIFIED 06/30/2009    Beaulah Dinning, PT, DPT 09/04/18 11:20 AM  [facility] [address] [place], [place], [address] Phone: [phone]   Fax:  [phone]  Name: [name] Sosa MRN: [id number] Date of Birth: [birth year]

## 2018-09-10 ENCOUNTER — Ambulatory Visit: Payer: 59 | Admitting: Physical Therapy

## 2018-09-22 ENCOUNTER — Telehealth: Payer: Self-pay | Admitting: Physical Therapy

## 2018-09-22 NOTE — Telephone Encounter (Signed)
[name] Sosa was contacted today regarding the temporary reduction of OP Rehab Services due to concerns for community transmission of Covid-19.    Therapist advised the patient to continue to perform their HEP and assured they had no unanswered questions at this time.  The patient was offered and declined the continuation in their POC by using methods such as an e-visit, virtual check in, or telehealth visit.    Outpatient Rehabilitation Services will follow up with this client when we are able to safely resume care at the [facility] in person.   Patient is aware we can be reached by telephone during limited business hours in the meantime.

## 2018-09-24 ENCOUNTER — Ambulatory Visit: Payer: 59 | Admitting: Physical Therapy

## 2018-09-29 ENCOUNTER — Ambulatory Visit: Payer: 59 | Attending: Orthopedic Surgery | Admitting: Physical Therapy

## 2018-09-29 ENCOUNTER — Other Ambulatory Visit: Payer: Self-pay

## 2018-09-29 DIAGNOSIS — M542 Cervicalgia: Secondary | ICD-10-CM | POA: Diagnosis present

## 2018-09-29 DIAGNOSIS — G8929 Other chronic pain: Secondary | ICD-10-CM | POA: Diagnosis present

## 2018-09-29 DIAGNOSIS — M6281 Muscle weakness (generalized): Secondary | ICD-10-CM | POA: Insufficient documentation

## 2018-09-29 DIAGNOSIS — M25511 Pain in right shoulder: Secondary | ICD-10-CM | POA: Diagnosis present

## 2018-09-29 NOTE — Therapy (Signed)
[place], [place], [address] Phone: [phone]   Fax:  [phone]  Physical Therapy Treatment  Patient Details  Name: [name] Sosa MRN: [id number] Date of Birth: Oct 25, 1955 Referring Provider (PT): Lennox Solders, MD   Encounter Date: 09/29/2018  PT End of Session - 09/29/18 1702    Visit Number  6    Number of Visits  21    Date for PT Re-Evaluation  10/30/18    Authorization Type  Aetna    PT Start Time  1247    PT Stop Time  1340    PT Time Calculation (min)  53 min    Activity Tolerance  Patient tolerated treatment well    Behavior During Therapy  [facility] for tasks assessed/performed       Past Medical History:  Diagnosis Date  . CAD (coronary artery disease)    s/p 7 vessel CABG 2000 at [facility]  . Carotid artery occlusion   . High cholesterol   . Hyperlipidemia   . Hypertension   . Pre-diabetes    < 6.1 2017  . Stroke ([place]) 04/2016   TIA- 04/2016 double vision 2nd - speech - no residual  . 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 4v cabg    . CARDIAC CATHETERIZATION    . CARDIAC CATHETERIZATION N/A 03/23/2016   Procedure: Left Heart Cath and Cors/Grafts Angiography;  Surgeon: Burnell Blanks, MD;  Location: [place] [facility];  Service: Cardiovascular;  Laterality: N/A;  . CAROTID ANGIOGRAPHY N/A 10/11/2016   Procedure: Carotid Angiography / Cerebral angiogram;  Surgeon: Conrad [place], MD;  Location: [place] [facility];  Service: Cardiovascular;  Laterality: N/A;  . CAROTID ENDARTERECTOMY    . COLONOSCOPY  2017  . COLONOSCOPY W/ POLYPECTOMY    . CORONARY ARTERY BYPASS GRAFT    . ENDARTERECTOMY Left 10/16/2016   Procedure: LEFT CAROTID ENDARTERECTOMY WITH PATCH ANGIOPLASTY;  Surgeon: Conrad [place], MD;  Location: [place];  Service: Vascular;  Laterality: Left;  . patent grafts    . s/p 7    . UPPER GASTROINTESTINAL ENDOSCOPY       There were no vitals filed for this visit.  Subjective Assessment - 09/29/18 1656    Subjective  Pt relays Rt sided neck and shoulder pain, he brought in home cervical traction unit but not sure how to set it up. He denies any N/T    Currently in Pain?  Yes    Pain Score  5     Pain Location  Shoulder    Pain Orientation  Right    Pain Descriptors / Indicators  Aching;Pressure                       [facility] Adult PT Treatment/Exercise - 09/29/18 0001      Self-Care   Self-Care  Other Self-Care Comments    Other Self-Care Comments   edu on how to properly use home traction unit      Neck Exercises: Supine   Neck Retraction  15 reps      Modalities   Modalities  Traction      Traction   Type of Traction  Cervical    Min (lbs)  15    Max (lbs)  22    Hold Time  60 sec    Rest Time  20 sec    Time  15 minutes  Manual Therapy   Manual Therapy  Soft tissue mobilization;Joint mobilization    Manual therapy comments  C-T spine mobs pt prone grade 3-5, sitting CT junction manipulation, GH mobs inferior, posterior, and distraction mobs grade 3-4    Soft tissue mobilization  STM right levator and upper trapezius             PT Education - 09/29/18 1701    Education Details  home traction unit Education for set up and proper use    Person(s) Educated  Patient    Methods  Explanation;Demonstration    Comprehension  Verbalized understanding       PT Short Term Goals - 09/04/18 1115      PT SHORT TERM GOAL #1   Title  Independent with HEP    Baseline  updated today    Time  4    Period  Weeks    Status  On-going      PT SHORT TERM GOAL #2   Title  Increase right shoulder abduction and ER strength at least 1/2 MMT grade to improve ability for lifting for chores    Baseline  ER 4/5, abd 4+/5    Time  4    Period  Weeks    Status  Partially Met        PT Long Term Goals - 09/04/18 1116      PT LONG TERM GOAL #1   Title  Improve FOTO  outcome measure score to 27% or less impairment    Baseline  38% limited    Time  8    Period  Weeks    Status  On-going    Target Date  10/30/18      PT LONG TERM GOAL #2   Title  Right shoulder and elbow + wrist strength grossly 5/5 to improv ability for lifting for chores and work duties    Baseline  see flowsheet, updated to include arm/wrist    Time  8    Period  Weeks    Status  Revised    Target Date  10/30/18      PT LONG TERM GOAL #3   Title  Decrease right shoulder + arm pain symptoms with reaching for dressing, chores, IADLs to 2/10 at worst    Baseline  Improving but pain intermittently higher, revised to include arm    Time  8    Period  Weeks    Status  Revised    Target Date  10/30/18      PT LONG TERM GOAL #4   Title  Independent with advanced HEP for exercise progression to continue progress after therapy discharge    Baseline  HEP updates ongoing    Time  8    Period  Weeks    Status  On-going    Target Date  10/30/18      PT LONG TERM GOAL #5   Title  Increase cervical rotation AROM at least 10 deg bilat. to improve ability to turn head while driving    Baseline  right 48 dge, left 45 deg    Time  8    Period  Weeks    Status  New    Target Date  10/30/18            Plan - 09/29/18 1703    Clinical Impression Statement  Session today focused on traction, manual therapy and self care for instruction and education on proper use of his home traction  unit. He reported overall less pain and had significant improvments in neck and shoulder ROM following session. He will continue to benefit from skilled PT.     Stability/Clinical Decision Making  Evolving/Moderate complexity    Rehab Potential  Fair    PT Frequency  Other (comment)   1-2x/week   PT Duration  8 weeks    PT Treatment/Interventions  ADLs/Self Care Home Management;Cryotherapy;Electrical Stimulation;Patient/family education;Dry needling;Manual techniques;Taping;Moist Heat;Neuromuscular  re-education;Therapeutic exercise;Therapeutic activities;Ultrasound;Traction    PT Next Visit Plan  Check response traction and continue as found beneficial, continue STM and further dry needling right levator, upper trap region, rotator cuff/periscapular and shoulder strengthening    PT Home Exercise Plan  Theraband IR, row, horizontal abduction, upper trap stretch, ER isometric vs. sidelying ER AROM with light weight (band too painful for ER), [place] abduction, cervical retractions and flexion AROM    Consulted and Agree with Plan of Care  Patient       Patient will benefit from skilled therapeutic intervention in order to improve the following deficits and impairments:  Pain, Impaired UE functional use, Decreased strength  Visit Diagnosis: Cervicalgia  Chronic right shoulder pain  Muscle weakness (generalized)     Problem List Patient Active Problem List   Diagnosis Date Noted  . Left carotid artery stenosis 10/16/2016  . Carotid artery disease ([place]) 10/05/2016  . Diplopia 05/03/2016  . 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 Unstable angina ([place]) 03/16/2016  . Coronary artery disease involving native coronary artery of native heart with unstable angina pectoris ([place]) 03/28/2011  . Hyperlipidemia 07/12/2009  . HYPERTENSION, BENIGN 07/12/2009  . CAD, ARTERY BYPASS GRAFT 07/12/2009  . CHEST PAIN-UNSPECIFIED 06/30/2009    Silvestre Mesi 09/29/2018, 5:07 PM  [facility] [address] [place], [place], [address] Phone: [phone]   Fax:  [phone]  Name: [name] Sosa MRN: [id number] Date of Birth: 1955/12/24

## 2018-10-02 ENCOUNTER — Encounter: Payer: 59 | Admitting: Physical Therapy

## 2018-10-07 ENCOUNTER — Encounter: Payer: 59 | Admitting: Physical Therapy

## 2018-10-07 ENCOUNTER — Other Ambulatory Visit: Payer: Self-pay

## 2018-10-07 ENCOUNTER — Ambulatory Visit: Payer: 59

## 2018-10-07 DIAGNOSIS — M25511 Pain in right shoulder: Secondary | ICD-10-CM

## 2018-10-07 DIAGNOSIS — G8929 Other chronic pain: Secondary | ICD-10-CM

## 2018-10-07 DIAGNOSIS — M542 Cervicalgia: Secondary | ICD-10-CM

## 2018-10-07 NOTE — Therapy (Signed)
[place], [place], [address] Phone: [phone]   Fax:  [phone]  Physical Therapy Treatment  Patient Details  Name: [name] Sosa MRN: [id number] Date of Birth: [birth year] Referring Provider (PT): Lennox Solders, MD   Encounter Date: 10/07/2018  PT End of Session - 10/07/18 1106    Visit Number  7    Number of Visits  21    Date for PT Re-Evaluation  10/30/18    Authorization Type  Aetna    PT Start Time  1105    PT Stop Time  1155    PT Time Calculation (min)  50 min    Activity Tolerance  Patient tolerated treatment well    Behavior During Therapy  [facility] for tasks assessed/performed       Past Medical History:  Diagnosis Date  . CAD (coronary artery disease)    s/p 7 vessel CABG 2000 at [facility]  . Carotid artery occlusion   . High cholesterol   . Hyperlipidemia   . Hypertension   . Pre-diabetes    < 6.1 2017  . Stroke ([place]) 04/2016   TIA- 04/2016 double vision 2nd - speech - no residual  . 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 4v cabg    . CARDIAC CATHETERIZATION    . CARDIAC CATHETERIZATION N/A 03/23/2016   Procedure: Left Heart Cath and Cors/Grafts Angiography;  Surgeon: Burnell Blanks, MD;  Location: [place] [facility];  Service: Cardiovascular;  Laterality: N/A;  . CAROTID ANGIOGRAPHY N/A 10/11/2016   Procedure: Carotid Angiography / Cerebral angiogram;  Surgeon: Conrad Albright, MD;  Location: [place] [facility];  Service: Cardiovascular;  Laterality: N/A;  . CAROTID ENDARTERECTOMY    . COLONOSCOPY  2017  . COLONOSCOPY W/ POLYPECTOMY    . CORONARY ARTERY BYPASS GRAFT    . ENDARTERECTOMY Left 10/16/2016   Procedure: LEFT CAROTID ENDARTERECTOMY WITH PATCH ANGIOPLASTY;  Surgeon: Conrad Ducor, MD;  Location: [place];  Service: Vascular;  Laterality: Left;  . patent grafts    . s/p 7    . UPPER GASTROINTESTINAL ENDOSCOPY       There were no vitals filed for this visit.  Subjective Assessment - 10/07/18 1108    Subjective  He reports Rt neck and shoulder issues.   He reports he was doing ok before  and now not as good.   He reports stretching and strengthening. Marland Kitchen He does over door traction. After last session was better for 2 days then regressed.  Pain late in PM worse it gets. .      Pain Score  3     Pain Location  Shoulder   deltoid   Pain Orientation  Right    Pain Descriptors / Indicators  Aching    Pain Type  Chronic pain    Pain Onset  More than a month ago    Pain Frequency  Constant    Aggravating Factors   worse late in dy    Pain Relieving Factors  rest, meds         [facility] PT Assessment - 10/07/18 0001      Posture/Postural Control   Posture Comments  head tilted RT      Strength   Right Shoulder Flexion  4+/5    Right Shoulder Internal Rotation  5/5    Right Shoulder External Rotation  4/5  [facility] Adult PT Treatment/Exercise - 10/07/18 0001      Traction   Type of Traction  Cervical    Min (lbs)  15    Max (lbs)  22    Hold Time  60 sec    Rest Time  20 sec    Time  15 minutes      Manual Therapy   Manual therapy comments  C-T spine mobs pt prone grade 3-5, sitting CT junction manipulation, GH mobs inferior, posterior, and distraction mobs grade 3-4    Soft tissue mobilization  STM right levator and upper trapezius    Myofascial Release  right upper trapezius               PT Short Term Goals - 10/07/18 1114      PT SHORT TERM GOAL #1   Title  Independent with HEP    Status  Achieved      PT SHORT TERM GOAL #2   Title  Increase right shoulder abduction and ER strength at least 1/2 MMT grade to improve ability for lifting for chores    Baseline  ER 4/5, abd 4+/5    Status  Partially Met        PT Long Term Goals - 10/07/18 1144      PT LONG TERM GOAL #1   Title  Improve FOTO outcome measure score to 27% or less impairment     Baseline  32% today    Status  On-going      PT LONG TERM GOAL #2   Title  Right shoulder and elbow + wrist strength grossly 5/5 to improv ability for lifting for chores and work duties    Baseline  see flowsheet, updated to include arm/wrist    Status  On-going      PT LONG TERM GOAL #3   Title  Decrease right shoulder + arm pain symptoms with reaching for dressing, chores, IADLs to 2/10 at worst    Status  On-going      PT LONG TERM GOAL #4   Title  Independent with advanced HEP for exercise progression to continue progress after therapy discharge    Status  On-going      PT LONG TERM GOAL #5   Title  Increase cervical rotation AROM at least 10 deg bilat. to improve ability to turn head while driving    Status  Unable to assess            Plan - 10/07/18 1142    Clinical Impression Statement  No improvement in neck and shoulder pain and function/carryover from PT.  Pain RT tral with palpation.    PT Treatment/Interventions  ADLs/Self Care Home Management;Cryotherapy;Electrical Stimulation;Patient/family education;Dry needling;Manual techniques;Taping;Moist Heat;Neuromuscular re-education;Therapeutic exercise;Therapeutic activities;Ultrasound;Traction    PT Next Visit Plan  Traction increased to 25 pounds. Weakness RT shoulder continued.    continue manual for pain.       PT Home Exercise Plan  Theraband IR, row, horizontal abduction, upper trap stretch, ER isometric vs. sidelying ER AROM with light weight (band too painful for ER), [place] abduction, cervical retractions and flexion AROM       Patient will benefit from skilled therapeutic intervention in order to improve the following deficits and impairments:  Pain, Impaired UE functional use, Decreased strength  Visit Diagnosis: Cervicalgia  Chronic right shoulder pain     Problem List Patient Active Problem List   Diagnosis Date Noted  . Left carotid artery stenosis 10/16/2016  .  Carotid artery disease ([place])  10/05/2016  . Diplopia 05/03/2016  . 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 Unstable angina ([place]) 03/16/2016  . Coronary artery disease involving native coronary artery of native heart with unstable angina pectoris ([place]) 03/28/2011  . Hyperlipidemia 07/12/2009  . HYPERTENSION, BENIGN 07/12/2009  . CAD, ARTERY BYPASS GRAFT 07/12/2009  . CHEST PAIN-UNSPECIFIED 06/30/2009    Darrel Hoover  PT 10/07/2018, 11:49 AM  [facility] [address] [place], [place], [address] Phone: [phone]   Fax:  [phone]  Name: Gerod Caligiuri MRN: [id number] Date of Birth: 04-10-1956

## 2018-10-13 ENCOUNTER — Ambulatory Visit: Payer: 59 | Admitting: Physical Therapy

## 2018-10-13 ENCOUNTER — Encounter: Payer: 59 | Admitting: Physical Therapy

## 2018-10-22 MED FILL — ROSUVASTATIN CALCIUM 10 MG: 10 | 30 days supply | Qty: 30 | Fill #5

## 2018-10-31 ENCOUNTER — Telehealth: Payer: Self-pay | Admitting: Cardiovascular Disease

## 2018-10-31 NOTE — Telephone Encounter (Signed)
I spoke with pt and gave him information from Dr. McAlhany 

## 2018-10-31 NOTE — Telephone Encounter (Signed)
[name] Sosa or [employer].

## 2018-10-31 NOTE — Telephone Encounter (Signed)
  Patient wants to talk to the nurse. He states that it is a Personnel officer and will not give more information

## 2018-10-31 NOTE — Telephone Encounter (Signed)
I spoke with pt. He is asking who Dr. [name] would recommend for general surgery.  Pt may need inguinal hernia repair.  Will send message to Dr. [name]. Pt aware if he needs referral this would need to come from his primary care provider.

## 2018-11-04 ENCOUNTER — Other Ambulatory Visit: Payer: Self-pay

## 2018-11-04 ENCOUNTER — Ambulatory Visit: Payer: 59 | Attending: Orthopedic Surgery | Admitting: Physical Therapy

## 2018-11-04 DIAGNOSIS — G8929 Other chronic pain: Secondary | ICD-10-CM | POA: Insufficient documentation

## 2018-11-04 DIAGNOSIS — M542 Cervicalgia: Secondary | ICD-10-CM | POA: Diagnosis present

## 2018-11-04 DIAGNOSIS — M25511 Pain in right shoulder: Secondary | ICD-10-CM | POA: Insufficient documentation

## 2018-11-04 DIAGNOSIS — M6281 Muscle weakness (generalized): Secondary | ICD-10-CM | POA: Insufficient documentation

## 2018-11-04 NOTE — Therapy (Signed)
[place], [place], [address] Phone: [phone]   Fax:  [phone]  Physical Therapy Recert and RE-Evaluation  Patient Details  Name: [name] Sosa MRN: [id number] Date of Birth: July 05, 1955 Referring Provider (PT): Lennox Solders, MD   Encounter Date: 11/04/2018  PT End of Session - 11/04/18 1129    Visit Number  8    Number of Visits  21    Date for PT Re-Evaluation  12/30/18    Authorization Type  Aetna    PT Start Time  1030    PT Stop Time  1117    PT Time Calculation (min)  47 min    Activity Tolerance  Patient tolerated treatment well    Behavior During Therapy  [facility] for tasks assessed/performed       Past Medical History:  Diagnosis Date  . CAD (coronary artery disease)    s/p 7 vessel CABG 2000 at [facility]  . Carotid artery occlusion   . High cholesterol   . Hyperlipidemia   . Hypertension   . Pre-diabetes    < 6.1 2017  . Stroke ([place]) 04/2016   TIA- 04/2016 double vision 2nd - speech - no residual  . 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 4v cabg    . CARDIAC CATHETERIZATION    . CARDIAC CATHETERIZATION N/A 03/23/2016   Procedure: Left Heart Cath and Cors/Grafts Angiography;  Surgeon: Burnell Blanks, MD;  Location: [place] [facility];  Service: Cardiovascular;  Laterality: N/A;  . CAROTID ANGIOGRAPHY N/A 10/11/2016   Procedure: Carotid Angiography / Cerebral angiogram;  Surgeon: Conrad Destrehan, MD;  Location: [place] [facility];  Service: Cardiovascular;  Laterality: N/A;  . CAROTID ENDARTERECTOMY    . COLONOSCOPY  2017  . COLONOSCOPY W/ POLYPECTOMY    . CORONARY ARTERY BYPASS GRAFT    . ENDARTERECTOMY Left 10/16/2016   Procedure: LEFT CAROTID ENDARTERECTOMY WITH PATCH ANGIOPLASTY;  Surgeon: Conrad Rocklake, MD;  Location: [place];  Service: Vascular;  Laterality: Left;  . patent grafts    . s/p 7    . UPPER GASTROINTESTINAL  ENDOSCOPY      There were no vitals filed for this visit.  Subjective Assessment - 11/04/18 1037    Subjective  Pt relays his neck is doing better but his Rt arm is doing really bad and he cant lift if up very good or hold onto to anything.   (Pended)     Currently in Pain?  Yes  (Pended)     Pain Score  9   (Pended)     Pain Location  Shoulder  (Pended)     Pain Orientation  Right;Anterior  (Pended)     Pain Descriptors / Indicators  Sharp  (Pended)     Pain Type  Chronic pain  (Pended)          [facility] PT Assessment - 11/04/18 0001      Assessment   Medical Diagnosis  Right shoulder bursitis, neck pain    Referring Provider (PT)  Lennox Solders, MD    Next MD Visit  not scheduled   he was encouraged to make one in 2 weeks if no improvment     AROM   Right Shoulder Flexion  --   [facility]   Right Shoulder ABduction  --   limited to 90 deg due to pain and weakness   Right Shoulder Internal Rotation  --  WNL   Right Shoulder External Rotation  --   WNL     PROM   Overall PROM Comments  WNL PROM right shoulder      Strength   Overall Strength Comments  decreased grip strength 4/5 on Rt compared to 5/5 on Lt    Right Shoulder Flexion  4/5    Right Shoulder Internal Rotation  4/5    Right Shoulder External Rotation  4-/5      Palpation   Palpation comment  TTP anterior shoulder near pec insertion and biceps tendon      Special Tests   Other special tests  inconclusive impingment signs, GH hypermobility with PAM testing, neg labral testing for shift or clunk but does have some pain with this                   [facility] Adult PT Treatment/Exercise - 11/04/18 0001      Exercises   Exercises  Shoulder      Shoulder Exercises: Sidelying   ABduction  Right;10 reps      Shoulder Exercises: Stretch   Other Shoulder Stretches  doorway stretch and posterior capsule stretch 30 sec X 3      Modalities   Modalities  Electrical Stimulation;Moist Heat      Moist Heat  Therapy   Number Minutes Moist Heat  15 Minutes    Moist Heat Location  Shoulder      Electrical Stimulation   Electrical Stimulation Location  Rt shoulder    Electrical Stimulation Action  IFC    Electrical Stimulation Parameters  to tolerance    Electrical Stimulation Goals  Pain      Manual Therapy   Soft tissue mobilization  STM Rt anterior shoulder             PT Education - 11/04/18 1128    Education Details  HEP for shoulder strength, home TENS unit     Person(s) Educated  Patient    Methods  Explanation;Demonstration    Comprehension  Verbalized understanding       PT Short Term Goals - 11/04/18 1108      PT SHORT TERM GOAL #1   Title  Independent with HEP    Baseline  updated today for more shoulder strength    Status  On-going      PT SHORT TERM GOAL #2   Title  Increase right shoulder abduction and ER strength at least 1/2 MMT grade to improve ability for lifting for chores    Baseline  overall 4/5    Status  Partially Met        PT Long Term Goals - 11/04/18 1108      PT LONG TERM GOAL #1   Title  Improve FOTO outcome measure score to 27% or less impairment    Baseline  32% today    Time  8    Period  Weeks    Status  On-going      PT LONG TERM GOAL #2   Title  Right shoulder and elbow + wrist strength grossly 5/5 to improv ability for lifting for chores and work duties    Baseline  see flowsheet, updated to include arm/wrist    Period  Weeks    Status  On-going      PT LONG TERM GOAL #3   Title  Decrease right shoulder + arm pain symptoms with reaching for dressing, chores, IADLs to 2/10 at worst    Baseline  neck has improved but has had setback in Rt arm pain and weakness    Period  Weeks    Status  On-going      PT LONG TERM GOAL #4   Title  Independent with advanced HEP for exercise progression to continue progress after therapy discharge    Baseline  HEP updates ongoing    Time  8    Period  Weeks    Status  On-going      PT  LONG TERM GOAL #5   Title  Increase cervical rotation AROM at least 10 deg bilat. to improve ability to turn head while driving    Baseline  60 deg bilat    Status  Achieved            Plan - 11/04/18 1132    Clinical Impression Statement  Recert/RE eval performed today as he was out of POC (now extended through 12/30/18) updated measurements and goals. He has made progress with his neck but has had recent set back with his Rt shoulder and now has more pain and weakness in his Rt shoulder. Special testing was inconclusive for impingment, he does not have clunking or clicking with labral testing, but is very TTP in his anterior shoulder and has significant Rt shoulder weakness concerning for possible tendon tear. He was trialed with TENS today for pain and inlammatio and was given shoulder strengthening/stabilization program for home. He was encouraged to try PT for 2-3 more weeks and if no improvments then we will refer him back to MD for further diagnostic testing.     Rehab Potential  Fair    PT Frequency  1x / week    PT Duration  8 weeks    PT Treatment/Interventions  ADLs/Self Care Home Management;Cryotherapy;Electrical Stimulation;Patient/family education;Dry needling;Manual techniques;Taping;Moist Heat;Neuromuscular re-education;Therapeutic exercise;Therapeutic activities;Ultrasound;Traction    PT Next Visit Plan  address Weakness RT shoulder and gripping, continue manual for pain.   Consider modalaties for pain    PT Home Exercise Plan  Theraband IR, row,upper trap stretch, ER isometric, [place] abduction, cervical retractions and flexion AROM, quadriped UE reaches, SL abd, gripping    Consulted and Agree with Plan of Care  Patient       Patient will benefit from skilled therapeutic intervention in order to improve the following deficits and impairments:  Pain, Impaired UE functional use, Decreased strength  Visit Diagnosis: Cervicalgia  Chronic right shoulder pain  Muscle  weakness (generalized)     Problem List Patient Active Problem List   Diagnosis Date Noted  . Left carotid artery stenosis 10/16/2016  . Carotid artery disease ([place]) 10/05/2016  . Diplopia 05/03/2016  . 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 Unstable angina ([place]) 03/16/2016  . Coronary artery disease involving native coronary artery of native heart with unstable angina pectoris ([place]) 03/28/2011  . Hyperlipidemia 07/12/2009  . HYPERTENSION, BENIGN 07/12/2009  . CAD, ARTERY BYPASS GRAFT 07/12/2009  . CHEST PAIN-UNSPECIFIED 06/30/2009    Debbe Odea ,PT,DPT 11/04/2018, 11:40 AM  [facility] [address] [place], [place], [address] Phone: [phone]   Fax:  [phone]  Name: [name] Sosa MRN: [id number] Date of Birth: 05-Oct-1955

## 2018-11-04 NOTE — Patient Instructions (Addendum)
Access Code: [email or web address]  URL: [email or web address]  Date: 11/04/2018  Prepared by: Ivery Quale   Exercises  Doorway Pec Stretch at 90 Degrees Abduction - 10 reps - 3 sets - 2x daily - 6x weekly  Standing Shoulder Row with Anchored Resistance - 10 reps - 3 sets - 2x daily - 6x weekly  Shoulder External Rotation with Anchored Resistance - 10 reps - 3 sets - 2x daily - 6x weekly  Shoulder Internal Rotation with Resistance - 10 reps - 3 sets - 2x daily - 6x weekly  Gripping Sponge Neutral - 10 reps - 3 sets - 2x daily - 6x weekly  Quadruped Alternating Arm Lift - 10 reps - 3 sets - 2x daily - 6x weekly  Sidelying Shoulder Abduction Palm Forward - 10 reps - 3 sets - 2x daily - 6x weekly   TENS UNIT: This is helpful for muscle pain and spasm.   Search and Purchase a TENS 7000 2nd edition at www.tenspros.com. It should be less than $30.     TENS unit instructions: Do not shower or bathe with the unit on Turn the unit off before removing electrodes or batteries If the electrodes lose stickiness add a drop of water to the electrodes after they are disconnected from the unit and place on plastic sheet. If you continued to have difficulty, call the TENS unit company to purchase more electrodes. Do not apply lotion on the skin area prior to use. Make sure the skin is clean and dry as this will help prolong the life of the electrodes. After use, always check skin for unusual red areas, rash or other skin difficulties. If there are any skin problems, does not apply electrodes to the same area. Never remove the electrodes from the unit by pulling the wires. Do not use the TENS unit or electrodes other than as directed. Do not change electrode placement without consultating your therapist or physician. Keep 2 fingers with between each electrode. Wear time ratio is 2:1, on to off times.    For example on for 30 minutes off for 15 minutes and then on for 30 minutes off for 15 minutes

## 2018-11-13 ENCOUNTER — Ambulatory Visit: Payer: 59 | Admitting: Physical Therapy

## 2018-11-13 ENCOUNTER — Encounter: Payer: Self-pay | Admitting: Physical Therapy

## 2018-11-13 ENCOUNTER — Other Ambulatory Visit: Payer: Self-pay

## 2018-11-13 DIAGNOSIS — G8929 Other chronic pain: Secondary | ICD-10-CM

## 2018-11-13 DIAGNOSIS — M6281 Muscle weakness (generalized): Secondary | ICD-10-CM

## 2018-11-13 DIAGNOSIS — M25511 Pain in right shoulder: Secondary | ICD-10-CM

## 2018-11-13 DIAGNOSIS — M542 Cervicalgia: Secondary | ICD-10-CM | POA: Diagnosis not present

## 2018-11-13 NOTE — Therapy (Signed)
[place], [place], [address] Phone: [phone]   Fax:  [phone]  Physical Therapy Treatment  Patient Details  Name: [name] Sosa MRN: [id number] Date of Birth: Nov 12, 1955 Referring Provider (PT): Lennox Solders, MD   Encounter Date: 11/13/2018  PT End of Session - 11/13/18 1113    Visit Number  9    Number of Visits  21    Date for PT Re-Evaluation  12/30/18    Authorization Type  A[employer]    PT Start Time  1047    PT Stop Time  1136    PT Time Calculation (min)  49 min    Activity Tolerance  Patient tolerated treatment well    Behavior During Therapy  [facility] for tasks assessed/performed       Past Medical History:  Diagnosis Date  . CAD (coronary artery disease)    s/p 7 vessel CABG 2000 at [facility]  . Carotid artery occlusion   . High cholesterol   . Hyperlipidemia   . Hypertension   . Pre-diabetes    < 6.1 2017  . Stroke ([place]) 04/2016   TIA- 04/2016 double vision 2nd - speech - no residual  . 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 4v cabg    . CARDIAC CATHETERIZATION    . CARDIAC CATHETERIZATION N/A 03/23/2016   Procedure: Left Heart Cath and Cors/Grafts Angiography;  Surgeon: Burnell Blanks, MD;  Location: [place] [facility];  Service: Cardiovascular;  Laterality: N/A;  . CAROTID ANGIOGRAPHY N/A 10/11/2016   Procedure: Carotid Angiography / Cerebral angiogram;  Surgeon: Conrad Felicity, MD;  Location: [place] [facility];  Service: Cardiovascular;  Laterality: N/A;  . CAROTID ENDARTERECTOMY    . COLONOSCOPY  2017  . COLONOSCOPY W/ POLYPECTOMY    . CORONARY ARTERY BYPASS GRAFT    . ENDARTERECTOMY Left 10/16/2016   Procedure: LEFT CAROTID ENDARTERECTOMY WITH PATCH ANGIOPLASTY;  Surgeon: Conrad , MD;  Location: [place];  Service: Vascular;  Laterality: Left;  . patent grafts    . s/p 7    . UPPER GASTROINTESTINAL ENDOSCOPY       There were no vitals filed for this visit.  Subjective Assessment - 11/13/18 1050    Subjective  Pt. reports pain has not been too bad but continues to report issues with right UE weakness-difficulty with raising arm and with gripping activities. He reports contacted Dr. Jeannie Fend and MRI has been ordered for shoulder. No UE radiating pain, no balance or bowel/bladder problems.    Currently in Pain?  Yes    Pain Score  1     Pain Location  Shoulder    Pain Orientation  Right    Pain Descriptors / Indicators  Aching    Pain Type  Chronic pain    Pain Onset  More than a month ago    Pain Frequency  Constant    Aggravating Factors   activity, worse later in day    Pain Relieving Factors  rest, medication    Effect of Pain on Daily Activities  limited ability reaching ADLs, lifting activities         [facility] PT Assessment - 11/13/18 0001      Strength   Overall Strength Comments  R grip 52 lb., left grip 46 lb. with grip dynamonometer  [facility] PT Treatment/Exercise - 11/13/18 0001      Elbow Exercises   Elbow Flexion  AROM;Right;20 reps   2 sets of 10 with 2 lb. weight     Shoulder Exercises: Supine   Flexion  --   short arc flexion punch 2 lb. 2x10     Shoulder Exercises: Sidelying   External Rotation  Strengthening;Right   2x7 reps limited by muscle fatigue   External Rotation Weight (lbs)  1    Other Sidelying Exercises  scaption x 15 reps      Shoulder Exercises: Standing   Internal Rotation  Strengthening;Right;20 reps    Theraband Level (Shoulder Internal Rotation)  Level 4 (Blue)    Extension  Strengthening;Both;20 reps    Theraband Level (Shoulder Extension)  Level 3 (Green)    Row  Both;20 reps    Theraband Level (Shoulder Row)  Level 3 (Green)    Other Standing Exercises  Tricep ext blue band 2x10    Other Standing Exercises  pt. has Dynagrip at home so discussed gripping exercises with HEP      Shoulder Exercises:  ROM/Strengthening   UBE (Upper Arm Bike)  L1 x 4 min with 2 min ea. fw/rev      Traction   Type of Traction  Cervical    Min (lbs)  15    Max (lbs)  24   started at 22 but increased to 24 per pt. request   Hold Time  60 sec    Rest Time  20 sec    Time  15 minutes             PT Education - 11/13/18 1118    Education Details  POC, potential symptom etiology    Person(s) Educated  Patient    Methods  Explanation;Demonstration    Comprehension  Verbalized understanding;Returned demonstration       PT Short Term Goals - 11/04/18 1108      PT SHORT TERM GOAL #1   Title  Independent with HEP    Baseline  updated today for more shoulder strength    Status  On-going      PT SHORT TERM GOAL #2   Title  Increase right shoulder abduction and ER strength at least 1/2 MMT grade to improve ability for lifting for chores    Baseline  overall 4/5    Status  Partially Met        PT Long Term Goals - 11/04/18 1108      PT LONG TERM GOAL #1   Title  Improve FOTO outcome measure score to 27% or less impairment    Baseline  32% today    Time  8    Period  Weeks    Status  On-going      PT LONG TERM GOAL #2   Title  Right shoulder and elbow + wrist strength grossly 5/5 to improv ability for lifting for chores and work duties    Baseline  see flowsheet, updated to include arm/wrist    Period  Weeks    Status  On-going      PT LONG TERM GOAL #3   Title  Decrease right shoulder + arm pain symptoms with reaching for dressing, chores, IADLs to 2/10 at worst    Baseline  neck has improved but has had setback in Rt arm pain and weakness    Period  Weeks    Status  On-going      PT LONG  TERM GOAL #4   Title  Independent with advanced HEP for exercise progression to continue progress after therapy discharge    Baseline  HEP updates ongoing    Time  8    Period  Weeks    Status  On-going      PT LONG TERM GOAL #5   Title  Increase cervical rotation AROM at least 10 deg bilat.  to improve ability to turn head while driving    Baseline  60 deg bilat    Status  Achieved            Plan - 11/13/18 1122    Clinical Impression Statement  Still suspect potential cuff pathology given weakness in abduction and ER but concern for cervical radiculopathy given more general right UE weakness. No overt "Popeye" sign with biceps. Will continue POC per re-eval with tx. next week but will plan await MRI findings as well with further POC pending results.    Clinical Decision Making  Moderate    PT Frequency  1x / week    PT Duration  8 weeks    PT Treatment/Interventions  ADLs/Self Care Home Management;Cryotherapy;Electrical Stimulation;Patient/family education;Dry needling;Manual techniques;Taping;Moist Heat;Neuromuscular re-education;Therapeutic exercise;Therapeutic activities;Ultrasound;Traction    PT Next Visit Plan  address Weakness RT shoulder and gripping, continue manual for pain.   Consider modalaties for pain    PT Home Exercise Plan  Theraband IR, row,upper trap stretch, ER isometric, [place] abduction, cervical retractions and flexion AROM, quadriped UE reaches, SL abd, gripping    Consulted and Agree with Plan of Care  Patient       Patient will benefit from skilled therapeutic intervention in order to improve the following deficits and impairments:  Pain, Impaired UE functional use, Decreased strength  Visit Diagnosis: Cervicalgia  Chronic right shoulder pain  Muscle weakness (generalized)     Problem List Patient Active Problem List   Diagnosis Date Noted  . Left carotid artery stenosis 10/16/2016  . Carotid artery disease ([place]) 10/05/2016  . Diplopia 05/03/2016  . 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 Unstable angina ([place]) 03/16/2016  . Coronary artery disease involving native coronary artery of native heart with unstable angina pectoris ([place]) 03/28/2011  . Hyperlipidemia 07/12/2009  . HYPERTENSION, BENIGN 07/12/2009  . CAD, ARTERY BYPASS  GRAFT 07/12/2009  . CHEST PAIN-UNSPECIFIED 06/30/2009    Beaulah Dinning, PT, DPT 11/13/18 11:27 AM  [facility] [address] [place], [place], [address] Phone: [phone]   Fax:  [phone]  Name: Rishit Burkhalter MRN: [id number] Date of Birth: 03-18-1956

## 2018-11-18 ENCOUNTER — Other Ambulatory Visit: Payer: Self-pay | Admitting: Orthopedic Surgery

## 2018-11-18 ENCOUNTER — Ambulatory Visit: Payer: 59 | Admitting: Physical Therapy

## 2018-11-18 DIAGNOSIS — M25511 Pain in right shoulder: Secondary | ICD-10-CM

## 2018-11-19 ENCOUNTER — Encounter (HOSPITAL_COMMUNITY): Payer: Self-pay | Admitting: Urology

## 2018-11-21 ENCOUNTER — Other Ambulatory Visit: Payer: Self-pay | Admitting: Internal Medicine

## 2018-11-21 DIAGNOSIS — E7849 Other hyperlipidemia: Secondary | ICD-10-CM

## 2018-11-24 ENCOUNTER — Ambulatory Visit (HOSPITAL_COMMUNITY)
Admission: RE | Admit: 2018-11-24 | Discharge: 2018-11-24 | Disposition: A | Discharge status: Home / Self Care | Payer: 59 | Source: Ambulatory Visit | Attending: Orthopedic Surgery | Admitting: Orthopedic Surgery

## 2018-11-24 ENCOUNTER — Other Ambulatory Visit: Payer: Self-pay

## 2018-11-24 DIAGNOSIS — M25511 Pain in right shoulder: Secondary | ICD-10-CM | POA: Insufficient documentation

## 2018-11-25 ENCOUNTER — Other Ambulatory Visit: Payer: Self-pay | Admitting: Family Medicine

## 2018-11-25 ENCOUNTER — Other Ambulatory Visit: Payer: Self-pay | Admitting: Pharmacist

## 2018-11-25 ENCOUNTER — Telehealth (INDEPENDENT_AMBULATORY_CARE_PROVIDER_SITE_OTHER): Payer: 59 | Admitting: Physician Assistant

## 2018-11-25 ENCOUNTER — Encounter: Payer: Self-pay | Admitting: Physician Assistant

## 2018-11-25 ENCOUNTER — Telehealth: Payer: Self-pay | Admitting: *Deleted

## 2018-11-25 ENCOUNTER — Telehealth: Payer: Self-pay | Admitting: Cardiovascular Disease

## 2018-11-25 VITALS — BP 130/68 | HR 68 | Ht 67.0 in | Wt 135.0 lb

## 2018-11-25 DIAGNOSIS — E7849 Other hyperlipidemia: Secondary | ICD-10-CM

## 2018-11-25 DIAGNOSIS — I25119 Atherosclerotic heart disease of native coronary artery with unspecified angina pectoris: Secondary | ICD-10-CM | POA: Diagnosis not present

## 2018-11-25 DIAGNOSIS — I5022 Chronic systolic (congestive) heart failure: Secondary | ICD-10-CM | POA: Insufficient documentation

## 2018-11-25 MED ORDER — AMLODIPINE BESYLATE 2.5 MG PO TABS: 2.5000 mg | ORAL_TABLET | Freq: Every day | ORAL | 3 refills | Status: DC

## 2018-11-25 MED ORDER — ROSUVASTATIN CALCIUM 10 MG PO TABS: 10.0000 mg | ORAL_TABLET | Freq: Every day | ORAL | 1 refills | Status: DC

## 2018-11-25 NOTE — Patient Instructions (Addendum)
Medication Instructions:   Start taking Amlodipine 2.5 mg once a day   If you need a refill on your cardiac medications before your next appointment, please call your pharmacy.   Lab work:  BMET BNP AND CBC  If you have labs (blood work) drawn today and your tests are completely normal, you will receive your results only by: Marland Kitchen MyChart Message (if you have MyChart) OR . A paper copy in the mail If you have any lab test that is abnormal or we need to change your treatment, we will call you to review the results.  Testing/Procedures: Your physician has requested that you have a lexiscan myoview. For further information please visit [email or web address]. Please follow instruction sheet, as given.  Your physician has requested that you have an echocardiogram. Echocardiography is a painless test that uses sound waves to create images of your heart. It provides your doctor with information about the size and shape of your heart and how well your heart's chambers and valves are working. This procedure takes approximately one hour. There are no restrictions for this procedure.    Follow-Up: in 2 weeks virtual visit Dr [name] or Kathlen Mody    Any Other Special Instructions Will Be Listed Below (If Applicable).  If have any recurrent chest pain or worsening shortness of breath,  should call for earlier follow-up  If any worsening dizziness with standing, call so that we can decrease  lisinopril

## 2018-11-25 NOTE — Telephone Encounter (Signed)
New Message      Pt c/o of Chest Pain: STAT if CP now or developed within 24 hours  1. Are you having CP right now? No, a couple days ago was the last time   2. Are you experiencing any other symptoms (ex. SOB, nausea, vomiting, sweating)? SOB   3. How long have you been experiencing CP? For a long time   4. Is your CP continuous or coming and going? Coming and going   5. Have you taken Nitroglycerin? No  ?

## 2018-11-25 NOTE — Progress Notes (Signed)
Virtual Visit via Video Note   This visit type was conducted due to national recommendations for restrictions regarding the COVID-19 Pandemic (e.g. social distancing) in an effort to limit this patient's exposure and mitigate transmission in our community.  Due to his co-morbid illnesses, this patient is at least at moderate risk for complications without adequate follow up.  This format is felt to be most appropriate for this patient at this time.  All issues noted in this document were discussed and addressed.  A limited physical exam was performed with this format.  Please refer to the patient's chart for his consent to telehealth for [facility].   Date:  11/25/2018   ID:  [name] PerchesSeyed Sosa, DOB 10/21/1955, MRN [id number]  Patient Location: Other:  Work Provider Location: Home  PCP:  Kallie LocksStroud, Natalie M, FNP  Cardiologist:  Verne Carrowhristopher McAlhany, MD   Electrophysiologist:  None   Evaluation Performed:  Follow-Up Visit  Chief Complaint: Chest pain  History of Present Illness:    [name] PerchesSeyed Sosa is a [age] y.o. male with coronary artery disease status post CABG x7 in 2000 at [facility], systolic CHF, hypertension, hyperlipidemia, carotid artery disease status post left CEA in May 2018, prior TIA.  He established with Dr. Clifton JamesMcAlhany in 2011.  He has had recurrent chest pain over the years.  Cardiac catheterization in 2017 demonstrated patent LIMA-LAD, patent RIMA-OM.  The S-D1 and S-D2 were known to occluded. The SVG-RCA was newly occluded with left-to-right collaterals.  Med Rx was recommended.  He was last seen by Dr. Clifton JamesMcAlhany in August 2019.  His angina was felt to be stable at that time.  He called in today with exertional chest pain or shortness of breath and was added on to my schedule.  Over the past week, he has noted shortness of breath with exertion.  He is a [employer]at[employer] at a [employer].  He climbs up 6 flights of steps per day.  He does this multiple times a day.  He has started to notice  shortness of breath with getting up the steps which is unusual for him.  The shortness of breath seems to come on towards the top of his climb.  He had a couple of episodes of chest discomfort with going up the steps.  He has been able to go up the steps at other times without chest discomfort.  He has not had any radiating symptoms, associated nausea or diaphoresis.  He has not had syncope.  He was recently placed on Flomax and does note symptoms of lightheadedness if he stands up too quickly.  He has not had orthopnea, paroxysmal nocturnal dyspnea or lower extremity swelling.  He has not had any weight changes.  He denies any fever or cough.  The patient does not have symptoms concerning for COVID-19 infection (fever, chills, cough, or new shortness of breath).    Past Medical History:  Diagnosis Date  . CAD (coronary artery disease)    [facility]  . Carotid artery occlusion   . High cholesterol   . Hyperlipidemia   . Hypertension   . Pre-diabetes    < 6.1 2017  . Stroke ([facility]) 04/2016   TIA- 04/2016 double vision 2nd - speech - no residual  . 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 4v cabg    . CARDIAC CATHETERIZATION    . CARDIAC CATHETERIZATION N/A 03/23/2016   Procedure: Left Heart Cath and  Cors/Grafts Angiography;  Surgeon: Kathleene Hazelhristopher D McAlhany, MD;  [facility];  Service: Cardiovascular;  Laterality: N/A;  . CAROTID ANGIOGRAPHY N/A 10/11/2016   Procedure: Carotid Angiography / Cerebral angiogram;  Surgeon: Fransisco HertzBrian L Chen, MD;  Location: [facility];  Service: Cardiovascular;  Laterality: N/A;  . CAROTID ENDARTERECTOMY    . COLONOSCOPY  2017  . COLONOSCOPY W/ POLYPECTOMY    . CORONARY ARTERY BYPASS GRAFT    . ENDARTERECTOMY Left 10/16/2016   Procedure: LEFT CAROTID ENDARTERECTOMY WITH PATCH ANGIOPLASTY;  Surgeon: Fransisco HertzBrian L Chen, MD;  [facility];  Service: Vascular;  Laterality: Left;   . patent grafts    . s/p 7    . UPPER GASTROINTESTINAL ENDOSCOPY       Current Meds  Medication Sig  . aspirin 81 MG chewable tablet Chew 81 mg by mouth daily.  . carvedilol (COREG) 3.125 MG tablet Take 1 tablet (3.125 mg total) by mouth 2 (two) times daily with a meal.  . lisinopril (PRINIVIL,ZESTRIL) 20 MG tablet TAKE 1 TABLET BY MOUTH ONCE DAILY  . nitroGLYCERIN (NITROSTAT) 0.4 MG SL tablet Place 1 tablet (0.4 mg total) under the tongue every 5 (five) minutes as needed for chest pain (up to 3 doses).  . rosuvastatin (CRESTOR) 10 MG tablet TAKE 1 TABLET BY MOUTH DAILY.  . tamsulosin (FLOMAX) 0.4 MG CAPS capsule Take 0.4 mg by mouth daily.     Allergies:   Lisinopril   Social History   Tobacco Use  . Smoking status: Former Smoker    Years: 2.00    Last attempt to quit: 06/19/1998    Years since quitting: 20.4  . Smokeless tobacco: Never Used  Substance Use Topics  . Alcohol use: No  . Drug use: No     Family Hx: The patient's family history includes Heart disease in his father and mother; Heart failure in his father and mother; Hypertension in his brother, father, mother, and sister. There is no history of Heart attack or Stroke.  ROS:   Please see the history of present illness.     All other systems reviewed and are negative.   Prior CV studies:   The following studies were reviewed today:  Carotid US 08/14/2017 Final Interpretation: Right Carotid: Velocities in the right ICA are consistent with a 40-59%                stenosis. The ECA appears >50% stenosed.  Left Carotid: Velocities in the left ICA are consistent with a 1-39% stenosis.               The ECA appears >50% stenosed.  Vertebrals:  Both vertebral arteries were patent with antegrade flow. Subclavians: Normal flow hemodynamics were seen in bilateral subclavian              arteries.  Cardiac Catheterization 03/23/16 LM ost 60 LAD ost 70, prox 100 - CTO LCx ost 100 - CTO RCA prox 100 - CTO  L-LAD patent RIMA-OM1 patent S-D1 100 CTO S-D2 known to be occluded S-RPDA 100 CTO EF 35-45   Myoview 01/18/14 Anteroapical scar, no ischemia, EF 48; Low Risk  Stress Echo 03/2010 EF 45-50   Labs/Other Tests and Data Reviewed:    EKG:  No ECG reviewed.  Recent Labs: 02/12/2018: ALT 13; BUN 19; Creatinine, Ser 0.75; Potassium 4.5; Sodium 141   Recent Lipid Panel Lab Results  Component Value Date/Time   CHOL 145 02/12/2018 07:46 AM   TRIG 136  02/12/2018 07:46 AM   HDL 45 02/12/2018 07:46 AM   CHOLHDL 3.2 02/12/2018 07:46 AM   CHOLHDL 3.4 10/24/2016 10:09 AM   LDLCALC 73 02/12/2018 07:46 AM    Wt Readings from Last 3 Encounters:  11/25/18 135 lb (61.2 kg)  02/06/18 134 lb 6.4 oz (61 kg)  11/20/17 135 lb (61.2 kg)     Objective:    Vital Signs:  BP 130/68   Pulse 68   Ht 5\' 7"  (1.702 m)   Wt 135 lb (61.2 kg)   SpO2 97%   BMI 21.14 kg/m    VITAL SIGNS:  reviewed GEN:  no acute distress EYES:  EOMI RESPIRATORY:  Normal respiratory effort NEURO:  Alert and oriented PSYCH:  normal affect  ASSESSMENT & PLAN:     Coronary artery disease involving native coronary artery of native heart with angina pectoris ([place]) History of CABG in 2000.  Cardiac catheterization in 2017 demonstrated 2/5 bypass grafts patent.  The SVG-RCA was newly occluded from the last catheterization.  The native RCA was also occluded.  He had left-to-right collaterals medical therapy was recommended.  He has had chronic angina.  Recently he has had more chest discomfort with exertion.  He has really only had 2 episodes of chest discomfort.  More bothersome for him is shortness of breath with activity.  I suspect a stress test will be somewhat abnormal.  However, I think stress testing initially would be helpful to help guide further evaluation.  If he has large areas of ischemia, he will need cardiac catheterization.  I have recommended adjusting his antianginal therapy.  He notes problems with  isosorbide in the past.  Therefore, I will place him on amlodipine.  He knows to contact us if his symptoms worsen.    -Start Amlodipine 2.5 mg once daily   -Labs: BMET, BNP, CBC  -Arrange Lexiscan Myoview  -FU with Dr. [name] or me in 2 weeks  Shortness of breath As noted his shortness of breath seems to be the most bothersome for him.  He does have history of an EF of 35-45 by the last cardiac catheterization.  He does not really have any other symptoms of volume excess.  I will obtain a BMET, CBC, BNP.  If his BNP is significantly elevated, I will place him on furosemide.  I will also obtain an echocardiogram.  As noted, amlodipine will be initiated for antianginal therapy.  -BMET, BNP, CBC  -Obtain Echo  Chronic systolic CHF (congestive heart failure) (HCC) EF 35-45 by cardiac catheterization in 2017.  NYHA 2.  Obtain labs as outlined above.  Arrange echocardiogram.  Follow-up in 2 weeks.  Bilateral carotid artery stenosis Continue aspirin, statin.  He is followed by vascular surgery.  Essential hypertension Blood pressure is controlled.  Pure hypercholesterolemia Continue statin therapy.  COVID-19 Education: The signs and symptoms of COVID-19 were discussed with the patient and how to seek care for testing (follow up with PCP or arrange E-visit).  The importance of social distancing was discussed today.  Time:   Today, I have spent 21 minutes with the patient with telehealth technology discussing the above problems.     Medication Adjustments/Labs and Tests Ordered: Current medicines are reviewed at length with the patient today.  Concerns regarding medicines are outlined above.   Tests Ordered: Orders Placed This Encounter  Procedures  . Basic metabolic panel  . CBC  . Pro b natriuretic peptide (BNP)  . MYOCARDIAL PERFUSION IMAGING  . ECHOCARDIOGRAM COMPLETE  Medication Changes: Meds ordered this encounter  Medications  . amLODipine (NORVASC) 2.5 MG tablet     Sig: Take 1 tablet (2.5 mg total) by mouth daily.    Dispense:  90 tablet    Refill:  3    Disposition:  Follow up in 2 week(s)  Signed, Tereso NewcomerScott Jaquez Farrington, PA-C  11/25/2018 3:54 PM    [facility]

## 2018-11-25 NOTE — Telephone Encounter (Signed)
Lvm on patient number to call back clinic to set up labs on Friday and follow up in 2 weeks  

## 2018-11-25 NOTE — Telephone Encounter (Signed)
Spoke with the pt and he agrees to a Televisit this afternoon with Tereso NewcomerScott Weaver PA... consent given he has a smartphone and verbalized understanding of the instructions.. he will have someone at work check his BP and HR for the visit.        Virtual Visit Pre-Appointment Phone Call  "(Name), I am calling you today to discuss your upcoming appointment. We are currently trying to limit exposure to the virus that causes COVID-19 by seeing patients at home rather than in the office."  1. "What is the BEST phone number to call the day of the visit?" - include this in appointment notes  2. "Do you have or have access to (through a family member/friend) a smartphone with video capability that we can use for your visit?" a. If yes - list this number in appt notes as "cell" (if different from BEST phone #) and list the appointment type as a VIDEO visit in appointment notes b. If no - list the appointment type as a PHONE visit in appointment notes  3. Confirm consent - "In the setting of the current Covid19 crisis, you are scheduled for a (phone or video) visit with your provider on (date) at (time).  Just as we do with many in-office visits, in order for you to participate in this visit, we must obtain consent.  If you'd like, I can send this to your mychart (if signed up) or email for you to review.  Otherwise, I can obtain your verbal consent now.  All virtual visits are billed to your insurance company just like a normal visit would be.  By agreeing to a virtual visit, we'd like you to understand that the technology does not allow for your provider to perform an examination, and thus may limit your provider's ability to fully assess your condition. If your provider identifies any concerns that need to be evaluated in person, we will make arrangements to do so.  Finally, though the technology is pretty good, we cannot assure that it will always work on either your or our end, and in the setting of a video  visit, we may have to convert it to a phone-only visit.  In either situation, we cannot ensure that we have a secure connection.  Are you willing to proceed?" STAFF: Did the patient verbally acknowledge consent to telehealth visit? Document YES/NO here: YES  4. Advise patient to be prepared - "Two hours prior to your appointment, go ahead and check your blood pressure, pulse, oxygen saturation, and your weight (if you have the equipment to check those) and write them all down. When your visit starts, your provider will ask you for this information. If you have an Apple Watch or Kardia device, please plan to have heart rate information ready on the day of your appointment. Please have a pen and paper handy nearby the day of the visit as well."  5. Give patient instructions for MyChart download to smartphone OR Doximity/Doxy.me as below if video visit (depending on what platform provider is using)  6. Inform patient they will receive a phone call 15 minutes prior to their appointment time (may be from unknown caller ID) so they should be prepared to answer    TELEPHONE CALL NOTE  [name] Sosa has been deemed a candidate for a follow-up tele-health visit to limit community exposure during the Covid-19 pandemic. I spoke with the patient via phone to ensure availability of phone/video source, confirm preferred email & phone number, and discuss  instructions and expectations.  I reminded [name] Sosa to be prepared with any vital sign and/or heart rhythm information that could potentially be obtained via home monitoring, at the time of his visit. I reminded [name] Sosa to expect a phone call prior to his visit.  Stephani Police, RN 11/25/2018 10:06 AM   INSTRUCTIONS FOR DOWNLOADING THE MYCHART APP TO SMARTPHONE  - The patient must first make sure to have activated MyChart and know their login information - If Apple, go to [employer] and type in MyChart in the search bar and download the app. If  Android, ask patient to go to [employer] and type in [place] in the search bar and download the app. The app is free but as with any other app downloads, their phone may require them to verify saved payment information or Apple/Android password.  - The patient will need to then log into the app with their MyChart username and password, and select [facility] as their healthcare provider to link the account. When it is time for your visit, go to the MyChart app, find appointments, and click Begin Video Visit. Be sure to Select Allow for your device to access the Microphone and Camera for your visit. You will then be connected, and your provider will be with you shortly.  **If they have any issues connecting, or need assistance please contact MyChart service desk [phone])**  **If using a computer, in order to ensure the best quality for their visit they will need to use either of the following Internet Browsers: [employer], or Google Chrome**  IF USING DOXIMITY or DOXY.ME - The patient will receive a link just prior to their visit by text.     FULL LENGTH CONSENT FOR TELE-HEALTH VISIT   I hereby voluntarily request, consent and authorize [facility] and its employed or contracted physicians, physician assistants, nurse practitioners or other licensed health care professionals (the Practitioner), to provide me with telemedicine health care services (the "Services") as deemed necessary by the treating Practitioner. I acknowledge and consent to receive the Services by the Practitioner via telemedicine. I understand that the telemedicine visit will involve communicating with the Practitioner through live audiovisual communication technology and the disclosure of certain medical information by electronic transmission. I acknowledge that I have been given the opportunity to request an in-person assessment or other available alternative prior to the telemedicine visit and am  voluntarily participating in the telemedicine visit.  I understand that I have the right to withhold or withdraw my consent to the use of telemedicine in the course of my care at any time, without affecting my right to future care or treatment, and that the Practitioner or I may terminate the telemedicine visit at any time. I understand that I have the right to inspect all information obtained and/or recorded in the course of the telemedicine visit and may receive copies of available information for a reasonable fee.  I understand that some of the potential risks of receiving the Services via telemedicine include:  Marland Kitchen Delay or interruption in medical evaluation due to technological equipment failure or disruption; . Information transmitted may not be sufficient (e.g. poor resolution of images) to allow for appropriate medical decision making by the Practitioner; and/or  . In rare instances, security protocols could fail, causing a breach of personal health information.  Furthermore, I acknowledge that it is my responsibility to provide information about my medical history, conditions and care that is complete and accurate to  the best of my ability. I acknowledge that Practitioner's advice, recommendations, and/or decision may be based on factors not within their control, such as incomplete or inaccurate data provided by me or distortions of diagnostic images or specimens that may result from electronic transmissions. I understand that the practice of medicine is not an exact science and that Practitioner makes no warranties or guarantees regarding treatment outcomes. I acknowledge that I will receive a copy of this consent concurrently upon execution via email to the email address I last provided but may also request a printed copy by calling the office of [facility].    I understand that my insurance will be billed for this visit.   I have read or had this consent read to me. . I understand the  contents of this consent, which adequately explains the benefits and risks of the Services being provided via telemedicine.  . I have been provided ample opportunity to ask questions regarding this consent and the Services and have had my questions answered to my satisfaction. . I give my informed consent for the services to be provided through the use of telemedicine in my medical care  By participating in this telemedicine visit I agree to the above.

## 2018-11-25 NOTE — Progress Notes (Unsigned)
Pt is seen by PCP Clovis Riley - requesting refills of rosuvastatin. Will forward request.

## 2018-11-25 NOTE — Telephone Encounter (Signed)
Pt called to report that he has been having increased SOB with exertion. 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 He does develop some chest pressure although he says he has has that chronically, so he has not taken any Nitro. He denies peripheral edema and sleeps well at night. He says the SOB is not anything he has experienced before with his long history of CAD.   He says he has been tested negative for COVID recently.. no fever or cough.   Pt says he feels he is "due' for a stress test. He cannot be seen in the office until possibly Friday since he is still working.   Attempted to call the pt for a possible Televisit with Richardson Dopp PA today and to see if he has access to getting his BP and HR checked with his dizziness. Had to [facility] and asked he be put through to me and not have to leave a message.

## 2018-11-26 ENCOUNTER — Telehealth: Payer: Self-pay

## 2018-11-26 MED FILL — ROSUVASTATIN CALCIUM 10 MG: 10 | 30 days supply | Qty: 30 | Fill #0

## 2018-11-26 NOTE — Telephone Encounter (Signed)
-----   Message from Azzie Glatter, [place] sent at 11/25/2018  9:11 PM EDT ----- Regarding: "Refill" Refill for Crestor sent to pharmacy today. Please inform patient.

## 2018-11-26 NOTE — Telephone Encounter (Signed)
Lvm on patient number to call back clinic to set up labs on Friday and follow up in 2 weeks

## 2018-11-26 NOTE — Telephone Encounter (Signed)
Patient notified

## 2018-11-28 ENCOUNTER — Other Ambulatory Visit: Payer: 59 | Admitting: *Deleted

## 2018-11-28 ENCOUNTER — Other Ambulatory Visit: Payer: Self-pay

## 2018-11-28 ENCOUNTER — Telehealth: Payer: Self-pay | Admitting: Cardiovascular Disease

## 2018-11-28 ENCOUNTER — Other Ambulatory Visit: Payer: Self-pay | Admitting: Physician Assistant

## 2018-11-28 DIAGNOSIS — I25119 Atherosclerotic heart disease of native coronary artery with unspecified angina pectoris: Secondary | ICD-10-CM

## 2018-11-28 DIAGNOSIS — R0602 Shortness of breath: Secondary | ICD-10-CM

## 2018-11-28 NOTE — Telephone Encounter (Signed)
Pt was in the office for his lab work, and wanted to have someone check his BP, while he was there. Pt states that unfortunately he couldn't find anyone so he left. Advised the pt that if he has a bp cuff at home, he can take this once a day for a couple days, log this, and send this via mychart to Dr [name] and Fraser Din RN.  Pt verbalized understanding and agrees with this plan.  Pt states he will take his BP sitting and standing, for he said the numbers slightly change when he changes positions.  The only value pt provided me was 130/58. Pt is asymptomatic at this time.  Informed the pt that I will route this message to [facility], so that she can further follow-up with the pt as needed.  Pt verbalized understanding and agrees with this plan.

## 2018-11-28 NOTE — Telephone Encounter (Signed)
Patient called and asked to speak with Dr. Camillia Herter nurse. He was in the office and wanted someone to check his BP. He disconnected while I was trying to locate the nurse

## 2018-11-29 LAB — CBC
Hematocrit: 42.6 % (ref 37.5–51.0)
Hemoglobin: 14.5 g/dL (ref 13.0–17.7)
MCH: 31.2 pg (ref 26.6–33.0)
MCHC: 34 g/dL (ref 31.5–35.7)
MCV: 92 fL (ref 79–97)
Platelets: 263 x10E3/uL (ref 150–450)
RBC: 4.65 x10E6/uL (ref 4.14–5.80)
RDW: 12.2 % (ref 11.6–15.4)
WBC: 5.9 x10E3/uL (ref 3.4–10.8)

## 2018-11-29 LAB — LIPID PANEL
Chol/HDL Ratio: 3 ratio (ref 0.0–5.0)
Cholesterol, Total: 164 mg/dL (ref 100–199)
HDL: 55 mg/dL (ref >39)
LDL Calculated: 90 mg/dL (ref 0–99)
Triglycerides: 94 mg/dL (ref 0–149)
VLDL Cholesterol Cal: 19 mg/dL (ref 5–40)

## 2018-11-29 LAB — BASIC METABOLIC PANEL
BUN/Creatinine Ratio: 18 (ref 10–24)
BUN: 14 mg/dL (ref 8–27)
CO2: 25 mmol/L (ref 20–29)
Calcium: 9.7 mg/dL (ref 8.6–10.2)
Chloride: 102 mmol/L (ref 96–106)
Creatinine, Ser: 0.78 mg/dL (ref 0.76–1.27)
GFR calc Af Amer: 112 mL/min/{1.73_m2} (ref >59)
GFR calc non Af Amer: 97 mL/min/{1.73_m2} (ref >59)
Glucose: 86 mg/dL (ref 65–99)
Potassium: 4.4 mmol/L (ref 3.5–5.2)
Sodium: 139 mmol/L (ref 134–144)

## 2018-11-29 LAB — PRO B NATRIURETIC PEPTIDE: NT-Pro BNP: 229 pg/mL — ABNORMAL HIGH (ref 0–210)

## 2018-12-01 ENCOUNTER — Telehealth: Payer: Self-pay | Admitting: *Deleted

## 2018-12-01 DIAGNOSIS — E7800 Pure hypercholesterolemia, unspecified: Secondary | ICD-10-CM

## 2018-12-01 MED ORDER — ROSUVASTATIN CALCIUM 20 MG PO TABS: 20.0000 mg | ORAL_TABLET | Freq: Every day | ORAL | 3 refills | Status: DC

## 2018-12-01 NOTE — Telephone Encounter (Signed)
Pt has been notified of lab results and recommendations. Pt is agreeable to recommendations to increase Crestor to 20 mg daily, new Rx has been sent in [facility] in [place]. Repeat fasting labs scheduled for 01/26/19. See phone note in regards to Amlodipine and chest pain pt has been having. Patient notified of result.  Please refer to phone note from today for complete details.   Julaine Hua, [place] 12/01/2018 3:40 PM   Pt states to me that Richardson Dopp, PA recently started him on Amlodipine 2.5 mg daily. Pt states to me that he only took this x 2 and with each time he had chest pain. States he last took Amlodipine Saturday 11/29/18. Pt states on day 1 after he took the Amlodipine he had chest pain and took NTG w/relief. Day 2 he took Amlodipine he had more chest pain, this time pt states he had to take NTG  X 5. I explained to the pt that he should had called 911 after the 3rd NTG if he still had no relief. Pt states he has angina a lot though he has not had to take this much NTG in a long time. On pain scale 1-10 with 10 being the worst pt states he put his chest pain between 3-4. Has some sob, denies edema, fever, chills, dysuria, n&v. I stated to the pt that I am going to have a triage nurse call him as well to make sure that he is ok and that it was not his heart and that may be a reaction to the Amlodipine. Pt states to me that he read in the pamphlet that came with the Amlodipine that a rare side effect could be chest pain. Pt thanked me for the call and is aware a triage nurse will call him as well.  Pt did ask if this could run by Dr. [name] as well.

## 2018-12-01 NOTE — Addendum Note (Signed)
Addended by: Thompson Grayer on: 12/01/2018 04:44 PM   Modules accepted: Orders

## 2018-12-01 NOTE — Telephone Encounter (Signed)
He should not restart the amlodipine although chest pain is a rare side effect. [name] Sosa

## 2018-12-01 NOTE — Telephone Encounter (Signed)
Pt notified.  He did not take amlodipine yesterday or today and is feeling much better.

## 2018-12-02 ENCOUNTER — Other Ambulatory Visit: Payer: Self-pay | Admitting: Cardiovascular Disease

## 2018-12-04 ENCOUNTER — Telehealth: Payer: Self-pay | Admitting: *Deleted

## 2018-12-04 ENCOUNTER — Telehealth: Payer: Self-pay | Admitting: Cardiovascular Disease

## 2018-12-04 NOTE — Telephone Encounter (Signed)
He was seen on 11/25/2018 for chest pain and shortness of breath. These tests were ordered to evaluate his symptoms.   The stress test is to evaluate for any new or worsening blockages. His ejection fraction has been low on testing in the past (35-45%).  The echo is to evaluate his LV function and determine if there has been any worsening. Richardson Dopp, PA-C    12/04/2018 5:07 PM

## 2018-12-04 NOTE — Telephone Encounter (Signed)
Spoke with patient who had  concerns about upcoming tests( myoview/echo)  and what's going  to be done different since corona virus. Patient wants to know who's going to be in room a nurse a doctor. But patient stated he's had procedure multiple times he knows about it but want to know who are all the people who are going to be in there.  Patient claims no one looked in history and he's  getting test he's had before and don't really know why . Starting to question should he get test   I explained to patient we are practicing social distancing and  taking proper precautions he should be fine. Everyone who plays a part in his procedure is very qualified to preform procedures.  Patient was explained about management with medical history calls for procedure to be done to look at things such as CHF and CAD. Patient seemed  confused and over the place as in anxiety and panicky.  Patient was told Provider will be told about how he is felling about procedures and call back with amy recommendation.Marland Kitchen

## 2018-12-04 NOTE — Telephone Encounter (Signed)
Patient has a couple of questions about his upcoming stress test he would like to ask the nurse.

## 2018-12-05 ENCOUNTER — Telehealth: Payer: Self-pay | Admitting: Cardiovascular Disease

## 2018-12-05 NOTE — Telephone Encounter (Signed)
New message   Pt c/o medication issue:  1. Name of Medication:  lisinopril (PRINIVIL,ZESTRIL) 20 MG tablet  2. How are you currently taking this medication (dosage and times per day)? 1 time daily  3. Are you having a reaction (difficulty breathing--STAT)?n/a  4. What is your medication issue?Patient states that he was advised by Nicki Reaper weaver to take a 10 mg of this medication. He states that he can't split the pill. He would like a new prescription written for 10 mg sent to [place], [place].

## 2018-12-09 ENCOUNTER — Telehealth (HOSPITAL_COMMUNITY): Payer: Self-pay | Admitting: *Deleted

## 2018-12-09 NOTE — Telephone Encounter (Signed)
Patient given detailed instructions per Myocardial Perfusion Study Information Sheet for the test on 12/12/18. Patient notified to arrive 15 minutes early and that it is imperative to arrive on time for appointment to keep from having the test rescheduled.  If you need to cancel or reschedule your appointment, please call the office within 24 hours of your appointment. . Patient verbalized understanding. Ame Heagle Jacqueline    

## 2018-12-09 NOTE — Telephone Encounter (Signed)
Left message on voicemail in reference to upcoming appointment scheduled for 12/12/18. Phone number given for a call back so details instructions can be given.  Kirstie Peri, RN

## 2018-12-10 ENCOUNTER — Encounter (HOSPITAL_COMMUNITY): Payer: 59

## 2018-12-10 ENCOUNTER — Other Ambulatory Visit: Payer: 59

## 2018-12-10 ENCOUNTER — Other Ambulatory Visit (HOSPITAL_COMMUNITY): Payer: 59

## 2018-12-11 ENCOUNTER — Telehealth (HOSPITAL_COMMUNITY): Payer: Self-pay | Admitting: Cardiology

## 2018-12-11 NOTE — Telephone Encounter (Signed)

## 2018-12-11 NOTE — Telephone Encounter (Signed)
Called to give echo appointment instructions. 

## 2018-12-12 ENCOUNTER — Ambulatory Visit (HOSPITAL_BASED_OUTPATIENT_CLINIC_OR_DEPARTMENT_OTHER): Payer: 59

## 2018-12-12 ENCOUNTER — Other Ambulatory Visit: Payer: Self-pay

## 2018-12-12 ENCOUNTER — Ambulatory Visit (HOSPITAL_COMMUNITY): Payer: 59 | Attending: Cardiology

## 2018-12-12 ENCOUNTER — Other Ambulatory Visit: Payer: Self-pay | Admitting: Cardiovascular Disease

## 2018-12-12 ENCOUNTER — Telehealth: Payer: 59 | Admitting: Physician Assistant

## 2018-12-12 ENCOUNTER — Encounter: Payer: Self-pay | Admitting: Physician Assistant

## 2018-12-12 DIAGNOSIS — I25119 Atherosclerotic heart disease of native coronary artery with unspecified angina pectoris: Secondary | ICD-10-CM

## 2018-12-12 DIAGNOSIS — R0602 Shortness of breath: Secondary | ICD-10-CM

## 2018-12-12 LAB — MYOCARDIAL PERFUSION IMAGING
LV dias vol: 89 mL (ref 62–150)
LV sys vol: 44 mL
Peak HR: 74 {beats}/min
Rest HR: 55 {beats}/min
SDS: 5
SRS: 3
SSS: 8
TID: 0.91

## 2018-12-12 MED ORDER — TECHNETIUM TC 99M TETROFOSMIN IV KIT
10.8000 | PACK | Freq: Once | INTRAVENOUS | Status: AC | PRN
  Administered 2018-12-12: 10.8 via INTRAVENOUS
  Filled 2018-12-12: qty 11

## 2018-12-12 MED ORDER — LISINOPRIL 20 MG PO TABS: 20.0000 mg | ORAL_TABLET | Freq: Every day | ORAL | 3 refills | Status: DC

## 2018-12-12 MED ORDER — TECHNETIUM TC 99M TETROFOSMIN IV KIT
32.7000 | PACK | Freq: Once | INTRAVENOUS | Status: AC | PRN
  Administered 2018-12-12: 32.7 via INTRAVENOUS
  Filled 2018-12-12: qty 33

## 2018-12-12 MED ORDER — REGADENOSON 0.4 MG/5ML IV SOLN
0.4000 mg | Freq: Once | INTRAVENOUS | Status: AC
  Administered 2018-12-12: 0.4 mg via INTRAVENOUS

## 2018-12-12 NOTE — Telephone Encounter (Signed)
Pt's medication was sent to pt's pharmacy as requested. Confirmation received.  °

## 2018-12-15 ENCOUNTER — Encounter: Payer: Self-pay | Admitting: Physician Assistant

## 2018-12-16 ENCOUNTER — Telehealth: Payer: 59 | Admitting: Physician Assistant

## 2018-12-22 DIAGNOSIS — M75111 Incomplete rotator cuff tear or rupture of right shoulder, not specified as traumatic: Secondary | ICD-10-CM | POA: Insufficient documentation

## 2018-12-30 NOTE — Telephone Encounter (Signed)
   Pt has appointment with Dr. [name] on July 20,2020 COVID-19 Pre-Screening Questions:  . In the past 7 to 10 days have you had a cough,  shortness of breath, headache, congestion, fever (100 or greater) body aches, chills, sore throat, or sudden loss of taste or sense of smell? no . Have you been around anyone with known Covid 19. no . Have you been around anyone who is awaiting Covid 19 test results in the past 7 to 10 days? no . Have you been around anyone who has been exposed to Covid 19, or has mentioned symptoms of Covid 19 within the past 7 to 10 days? no  If you have any concerns/questions about symptoms patients report during screening (either on the phone or at threshold). Contact the provider seeing the patient or DOD for further guidance.  If neither are available contact a member of the leadership team.     I spoke with pt regarding appointment on July 20,2020.  Pt aware this is in office. Pt made aware to contact us if answers to any of the above questions change prior to appt. Pt aware to wear mask to appointment. He is aware to arrive 15 minutes prior to appointment and that no one can come into appointment with him.   Pt reports BP has been running around 130/64.  He will bring readings with him to appointment.

## 2019-01-02 ENCOUNTER — Telehealth: Payer: Self-pay | Admitting: Cardiovascular Disease

## 2019-01-02 NOTE — Telephone Encounter (Signed)

## 2019-01-05 ENCOUNTER — Encounter: Payer: Self-pay | Admitting: Cardiovascular Disease

## 2019-01-05 ENCOUNTER — Ambulatory Visit (INDEPENDENT_AMBULATORY_CARE_PROVIDER_SITE_OTHER): Payer: 59 | Admitting: Cardiovascular Disease

## 2019-01-05 ENCOUNTER — Other Ambulatory Visit: Payer: Self-pay

## 2019-01-05 ENCOUNTER — Encounter

## 2019-01-05 VITALS — BP 132/74 | HR 59 | Ht 67.0 in | Wt 128.1 lb

## 2019-01-05 DIAGNOSIS — I10 Essential (primary) hypertension: Secondary | ICD-10-CM | POA: Diagnosis not present

## 2019-01-05 DIAGNOSIS — E7800 Pure hypercholesterolemia, unspecified: Secondary | ICD-10-CM

## 2019-01-05 DIAGNOSIS — Z01810 Encounter for preprocedural cardiovascular examination: Secondary | ICD-10-CM

## 2019-01-05 DIAGNOSIS — I25119 Atherosclerotic heart disease of native coronary artery with unspecified angina pectoris: Secondary | ICD-10-CM | POA: Diagnosis not present

## 2019-01-05 DIAGNOSIS — I6523 Occlusion and stenosis of bilateral carotid arteries: Secondary | ICD-10-CM | POA: Diagnosis not present

## 2019-01-05 NOTE — Progress Notes (Signed)
Chief Complaint  Patient presents with   Follow-up    CAD   History of Present Illness: 63 yo male with h/o CAD s/p 7V CABG in 2000 at 78, HTN, hyperlipidemia, carotid artery disease and prior TIA here today for cardiac follow up. He was seen as a new pt in January 2011 with complaints of chest pain. He had been followed from 2000 until 2011 by Dr. [name] in [place]. He has had frequent episodes of chest pain since I met him in 2011. Cardiac cath in 2011 with 5/7 patent bypass grafts. Stress myoview in August 2015 with no ischemia, LVEF=48%. Last cardiac cath October 2017 with patent LIMA to LAD, patent SVG to OM which filled all of the Circumflex and occluded RCA with occluded SVG to RCA. The RCA filled from left to right collaterals. He underwent left carotid endarterectomy in May 2018 following a TIA. His carotid disease is followed in [facility]. He was seen by virtual visit June 2020 by Richardson Dopp, PA-C with c/o dyspnea. Norvasc added. Nuclear stress test June 2020 with evidence of prior infarct but overall low risk. Echo June 2020 with LVEF=45-50% with segmental wall motion abnormality. No valve disease.   He is here today for follow up. The patient denies any chest pain, palpitations, lower extremity edema, orthopnea, PND, dizziness, near syncope or syncope. He has been having dyspnea when climbiing stairs at work.   Primary Care Physician: Patient, No Pcp Per  Past Medical History:  Diagnosis Date   CAD (coronary artery disease)    s/p 7 vessel CABG 2000 at [facility] // Echo 11/2018: EF 45-50, antero-apical and inferior akinesis // Myoview 11/2018: EF 51, distal ant/apical infarct and small inferior infarct with very mild peri-infarct ischemia; Low Risk   Carotid artery occlusion    High cholesterol    Hyperlipidemia    Hypertension    Pre-diabetes    < 6.1 2017   Stroke ([place]) 04/2016   TIA- 04/2016 double vision 2nd - speech - no residual   Trochlear neuropathy, right  05/03/2016    Past Surgical History:  Procedure Laterality Date   4v cabg     CARDIAC CATHETERIZATION     CARDIAC CATHETERIZATION N/A 03/23/2016   Procedure: Left Heart Cath and Cors/Grafts Angiography;  Surgeon: Burnell Blanks, MD;  Location: [place] [facility];  Service: Cardiovascular;  Laterality: N/A;   CAROTID ANGIOGRAPHY N/A 10/11/2016   Procedure: Carotid Angiography / Cerebral angiogram;  Surgeon: Conrad Dwight, MD;  Location: [place] [facility];  Service: Cardiovascular;  Laterality: N/A;   CAROTID ENDARTERECTOMY     COLONOSCOPY  2017   COLONOSCOPY W/ POLYPECTOMY     CORONARY ARTERY BYPASS GRAFT     ENDARTERECTOMY Left 10/16/2016   Procedure: LEFT CAROTID ENDARTERECTOMY WITH PATCH ANGIOPLASTY;  Surgeon: Conrad Lavelle, MD;  Location: [place];  Service: Vascular;  Laterality: Left;   patent grafts     s/p 7     UPPER GASTROINTESTINAL ENDOSCOPY      Current Outpatient Medications  Medication Sig Dispense Refill   aspirin 81 MG chewable tablet Chew 81 mg by mouth daily.     carvedilol (COREG) 3.125 MG tablet TAKE 1 TABLET BY MOUTH TWICE DAILY WITH MEALS 180 tablet 3   lisinopril (ZESTRIL) 20 MG tablet Take 1 tablet (20 mg total) by mouth daily. 90 tablet 3   nitroGLYCERIN (NITROSTAT) 0.4 MG SL tablet Place 1 tablet (0.4 mg total) under the tongue every 5 (five)  minutes as needed for chest pain (up to 3 doses). 25 tablet 3   rosuvastatin (CRESTOR) 20 MG tablet Take 1 tablet (20 mg total) by mouth daily. 90 tablet 3   tamsulosin (FLOMAX) 0.4 MG CAPS capsule Take 0.4 mg by mouth daily.     No current facility-administered medications for this visit.     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   Food insecurity    Worry: Not on file    Inability: Not on file   Transportation needs    Medical: Not on file    Non-medical: Not on file  Tobacco Use   Smoking status: Former Smoker    Years: 2.00    Quit date: 06/19/1998    Years since quitting: 20.5   Smokeless tobacco: Never Used  Substance and Sexual Activity   Alcohol use: No   Drug use: No   Sexual activity: Yes  Lifestyle   Physical activity    Days per week: Not on file    Minutes per session: Not on file   Stress: Not on file  Relationships   Social connections    Talks on phone: Not on file    Gets together: Not on file    Attends religious service: Not on file    Active member of club or organization: Not on file    Attends meetings of clubs or organizations: Not on file    Relationship status: Not on file   Intimate partner violence    Fear of current or ex partner: Not on file    Emotionally abused: Not on file    Physically abused: Not on file    Forced sexual activity: Not on file  Other Topics Concern   Not on file  Social History Narrative   Lives at home alone   Right-handed   Caffeine: 2 cups per day    Family History  Problem Relation Age of Onset   Heart failure Mother    Hypertension Mother    Heart disease Mother    Heart failure Father    Hypertension Father    Heart disease Father        before age 84   Hypertension Sister    Hypertension Brother    Heart attack Neg Hx    Stroke Neg Hx     Review of Systems:  As stated in the HPI and otherwise negative.   BP 132/74    Pulse (!) 59    Ht 5' 7"  (1.702 m)    Wt 128 lb 1.9 oz (58.1 kg)    SpO2 97%    BMI 20.07 kg/m   Physical Examination:  General: Well developed, well nourished, NAD  HEENT: OP clear, mucus membranes moist  SKIN: warm, dry. No rashes. Neuro: No focal deficits  Musculoskeletal: Muscle strength 5/5 all ext    Psychiatric: Mood and affect normal  Neck: No JVD, no carotid bruits, no thyromegaly, no lymphadenopathy.  Lungs:Clear bilaterally, no wheezes, rhonci, crackles Cardiovascular: Regular rate and rhythm. No murmurs, gallops or rubs. Abdomen:Soft. Bowel  sounds present. Non-tender.  Extremities: No lower extremity edema. Pulses are 2 + in the bilateral DP/PT.  Cardiac cath 03/23/16: 1. Triple vessel CAD s/p 5V CABG with 2/5 patent bypass grafts 2. LAD is occluded mid segment. LIMA graft is patent to the LAD 3. Circumflex is occluded proximally. The free RIMA graft to the first OM is patent and fills the entire Circumflex including all marginal branches.  4. The RCA is occluded proximally. The vein graft to the RCA is now occluded (new since last cath). The distal RCA fills from left to right collaterals.  5. Both SVG grafts to Diagonal 1 and Diagonal 2 are known to be occluded 6. Moderate systolic LV dysfunction, LKHV=74-73%.   Echo June 2020: 1. The left ventricle has mildly reduced systolic function, with an ejection fraction of 45-50%. The cavity size was normal. Left ventricular diastolic parameters were normal. Indeterminate filling pressures No evidence of left ventricular regional wall  motion abnormalities.  2. Severe akinesis of the left ventricular, mid-apical anterior wall and inferior wall.  3. The right ventricle has normal systolic function. The cavity was normal. There is no increase in right ventricular wall thickness.  4. The mitral valve is abnormal. Mild thickening of the mitral valve leaflet.  5. The tricuspid valve is grossly normal.  6. The aortic valve is tricuspid. No stenosis of the aortic valve.  EKG:  EKG is ordered today. The EKG demonstrates Sinus brady, rate 59 bpm. Inferior Q waves  Recent Labs: 02/12/2018: ALT 13 11/28/2018: BUN 14; Creatinine, Ser 0.78; Hemoglobin 14.5; NT-Pro BNP 229; Platelets 263; Potassium 4.4; Sodium 139   Lipid Panel    Component  Value Date/Time   CHOL 164 11/28/2018 1315   TRIG 94 11/28/2018 1315   HDL 55 11/28/2018 1315   CHOLHDL 3.0 11/28/2018 1315   CHOLHDL 3.4 10/24/2016 1009   VLDL 23 10/24/2016 1009   LDLCALC 90 11/28/2018 1315     Wt Readings from Last 3 Encounters:  01/05/19 128 lb 1.9 oz (58.1 kg)  12/12/18 135 lb (61.2 kg)  11/25/18 135 lb (61.2 kg)     Other studies Reviewed: Additional studies/ records that were reviewed today include: . Review of the above records demonstrates:    Assessment and Plan:   1. CAD s/p CABG with chronic stable angina: No change in chronic chest pain and dyspnea. Low risk stress test June 2020. Low normal LV systolic function by echo June 2020 with known wall motion abnormality. Last cardiac cath in October 2017 with stable CAD. Continue ASA, statin, beta blocker and He tried Norvasc but did not tolerate due to chest pain that he believes that was from this.   2. HTN: BP is controlled. NO changes  3. HLD: Recent LDL of 90 and Crestor increased to 20 mg daily. Repeat lipids and LFTs in September 2020.   4. Carotid artery disease: s/p left CEA in 2018. Followed in [facility]  6. Dyspnea: This does not appear to be cardiac related. See above. I have asked him to see primary care to review possible pulmonary issues  7. Pre-operative risk assessment: OK to proceed with shoulder surgery. I told him that [facility] can contact us for the official clearance note.   Current medicines are reviewed at length with the patient today.  The patient does not have concerns regarding medicines.  The following changes have been made:  no change  Labs/ tests ordered today include:   Orders Placed This Encounter  Procedures  EKG 12-Lead    Disposition:   FU with me in 12 months  Signed, [name], MD 01/05/2019 11:38 AM    [facility] [place], [place],   [address] Phone: [phone]; Fax: [phone]

## 2019-01-05 NOTE — Patient Instructions (Addendum)
Medication Instructions:  Your physician recommends that you continue on your current medications as directed. Please refer to the Current Medication list given to you today.  If you need a refill on your cardiac medications before your next appointment, please call your pharmacy.   Lab work: Keep lab appointment for August.  Make sure you come fasting for these labs (nothing to eat or drink after midnight except water and black coffee) If you have labs (blood work) drawn today and your tests are completely normal, you will receive your results only by: Marland Kitchen MyChart Message (if you have MyChart) OR . A paper copy in the mail If you have any lab test that is abnormal or we need to change your treatment, we will call you to review the results.  Testing/Procedures: None  Follow-Up: At [facility], you and your health needs are our priority.  As part of our continuing mission to provide you with exceptional heart care, we have created designated Provider Care Teams.  These Care Teams include your primary Cardiologist (physician) and Advanced Practice Providers (APPs -  Physician Assistants and Nurse Practitioners) who all work together to provide you with the care you need, when you need it. You will need a follow up appointment in 6 months.  Please call our office 2 months in advance to schedule this appointment.  You may see [name], MD or one of the following Advanced Practice Providers on your designated Care Team:   [place], PA-C Melina Copa, PA-C . Ermalinda Barrios, PA-C  Any Other Special Instructions Will Be Listed Below (If Applicable).

## 2019-01-21 ENCOUNTER — Ambulatory Visit: Payer: 59 | Admitting: Family Medicine

## 2019-01-26 ENCOUNTER — Other Ambulatory Visit: Payer: 59

## 2019-01-27 ENCOUNTER — Other Ambulatory Visit: Payer: 59

## 2019-01-29 ENCOUNTER — Other Ambulatory Visit: Payer: Self-pay

## 2019-01-29 ENCOUNTER — Other Ambulatory Visit: Payer: 59 | Admitting: *Deleted

## 2019-01-29 DIAGNOSIS — E7800 Pure hypercholesterolemia, unspecified: Secondary | ICD-10-CM

## 2019-01-29 LAB — HEPATIC FUNCTION PANEL
ALT: 18 IU/L (ref 0–44)
AST: 25 IU/L (ref 0–40)
Albumin: 4.5 g/dL (ref 3.8–4.8)
Alkaline Phosphatase: 54 IU/L (ref 39–117)
Bilirubin Total: 0.6 mg/dL (ref 0.0–1.2)
Bilirubin, Direct: 0.16 mg/dL (ref 0.00–0.40)
Total Protein: 6.7 g/dL (ref 6.0–8.5)

## 2019-01-29 LAB — LIPID PANEL
Chol/HDL Ratio: 2.7 ratio (ref 0.0–5.0)
Cholesterol, Total: 137 mg/dL (ref 100–199)
HDL: 50 mg/dL (ref >39)
LDL Calculated: 65 mg/dL (ref 0–99)
Triglycerides: 108 mg/dL (ref 0–149)
VLDL Cholesterol Cal: 22 mg/dL (ref 5–40)

## 2019-01-30 NOTE — Progress Notes (Signed)
NA, unable to LVM 8/14 8:40AM

## 2019-02-11 NOTE — Therapy (Signed)
[place], [place], [address] Phone: [phone]   Fax:  [phone]  Physical Therapy Treatment/Discharge  Patient Details  Name: [name] Sosa MRN: [id number] Date of Birth: 07-18-1955 Referring Provider (PT): Lennox Solders, MD   Encounter Date: 11/13/2018    Past Medical History:  Diagnosis Date  . CAD (coronary artery disease)    s/p 7 vessel CABG 2000 at [facility] // Echo 11/2018: EF 45-50, antero-apical and inferior akinesis // Myoview 11/2018: EF 51, distal ant/apical infarct and small inferior infarct with very mild peri-infarct ischemia; Low Risk  . Carotid artery occlusion   . High cholesterol   . Hyperlipidemia   . Hypertension   . Pre-diabetes    < 6.1 2017  . Stroke ([place]) 04/2016   TIA- 04/2016 double vision 2nd - speech - no residual  . 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 4v cabg    . CARDIAC CATHETERIZATION    . CARDIAC CATHETERIZATION N/A 03/23/2016   Procedure: Left Heart Cath and Cors/Grafts Angiography;  Surgeon: Burnell Blanks, MD;  Location: [place] [facility];  Service: Cardiovascular;  Laterality: N/A;  . CAROTID ANGIOGRAPHY N/A 10/11/2016   Procedure: Carotid Angiography / Cerebral angiogram;  Surgeon: Conrad Kirvin, MD;  Location: [place] [facility];  Service: Cardiovascular;  Laterality: N/A;  . CAROTID ENDARTERECTOMY    . COLONOSCOPY  2017  . COLONOSCOPY W/ POLYPECTOMY    . CORONARY ARTERY BYPASS GRAFT    . ENDARTERECTOMY Left 10/16/2016   Procedure: LEFT CAROTID ENDARTERECTOMY WITH PATCH ANGIOPLASTY;  Surgeon: Conrad Lambert, MD;  Location: [place];  Service: Vascular;  Laterality: Left;  . patent grafts    . s/p 7    . UPPER GASTROINTESTINAL ENDOSCOPY      There were no vitals filed for this visit.                              PT Short Term Goals - 11/04/18 1108      PT SHORT TERM  GOAL #1   Title  Independent with HEP    Baseline  updated today for more shoulder strength    Status  On-going      PT SHORT TERM GOAL #2   Title  Increase right shoulder abduction and ER strength at least 1/2 MMT grade to improve ability for lifting for chores    Baseline  overall 4/5    Status  Partially Met        PT Long Term Goals - 11/04/18 1108      PT LONG TERM GOAL #1   Title  Improve FOTO outcome measure score to 27% or less impairment    Baseline  32% today    Time  8    Period  Weeks    Status  On-going      PT LONG TERM GOAL #2   Title  Right shoulder and elbow + wrist strength grossly 5/5 to improv ability for lifting for chores and work duties    Baseline  see flowsheet, updated to include arm/wrist    Period  Weeks    Status  On-going      PT LONG TERM GOAL #3   Title  Decrease right shoulder + arm pain symptoms with reaching for dressing, chores, IADLs to 2/10 at worst    Baseline  neck has  improved but has had setback in Rt arm pain and weakness    Period  Weeks    Status  On-going      PT LONG TERM GOAL #4   Title  Independent with advanced HEP for exercise progression to continue progress after therapy discharge    Baseline  HEP updates ongoing    Time  8    Period  Weeks    Status  On-going      PT LONG TERM GOAL #5   Title  Increase cervical rotation AROM at least 10 deg bilat. to improve ability to turn head while driving    Baseline  60 deg bilat    Status  Achieved              Patient will benefit from skilled therapeutic intervention in order to improve the following deficits and impairments:  Pain, Impaired UE functional use, Decreased strength  Visit Diagnosis: Cervicalgia  Chronic right shoulder pain  Muscle weakness (generalized)     Problem List Patient Active Problem List   Diagnosis Date Noted  . Chronic systolic CHF (congestive heart failure) ([place]) 11/25/2018  . Left carotid artery stenosis 10/16/2016  .  Carotid artery disease ([place]) 10/05/2016  . Diplopia 05/03/2016  . 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 Unstable angina ([place]) 03/16/2016  . Coronary artery disease involving native coronary artery of native heart with angina pectoris ([place]) 03/28/2011  . Hyperlipidemia 07/12/2009  . Essential hypertension 07/12/2009  . CAD, ARTERY BYPASS GRAFT 07/12/2009  . CHEST PAIN-UNSPECIFIED 06/30/2009      PHYSICAL THERAPY DISCHARGE SUMMARY  Visits from Start of Care: 9  Current functional level related to goals / functional outcomes: Current status unknown-patient did not return for further therapy after last visit 11/13/18-he was recommended to follow up with MD for further assessment of continued shoulder pain.   Remaining deficits: NA   Education / Equipment: NA Plan:                                                    Patient goals were partially met. Patient is being discharged due to meeting the stated rehab goals.  ?????          Beaulah Dinning, PT, DPT 02/11/19 11:21 AM    [facility] [address] [place], [place], [address] Phone: [phone]   Fax:  [phone]  Name: [name] Sosa MRN: [id number] Date of Birth: 09/07/1955

## 2019-04-02 ENCOUNTER — Other Ambulatory Visit: Payer: Self-pay

## 2019-04-02 DIAGNOSIS — Z20822 Contact with and (suspected) exposure to covid-19: Secondary | ICD-10-CM

## 2019-04-04 ENCOUNTER — Telehealth: Payer: Self-pay

## 2019-04-04 LAB — NOVEL CORONAVIRUS, NAA: SARS-CoV-2, NAA: NOT DETECTED

## 2019-04-04 NOTE — Telephone Encounter (Signed)
Pt called for test results advised covid test is not back

## 2019-06-17 DIAGNOSIS — K4091 Unilateral inguinal hernia, without obstruction or gangrene, recurrent: Secondary | ICD-10-CM | POA: Insufficient documentation

## 2019-07-20 ENCOUNTER — Encounter: Payer: Self-pay | Admitting: Cardiovascular Disease

## 2019-07-20 ENCOUNTER — Ambulatory Visit (INDEPENDENT_AMBULATORY_CARE_PROVIDER_SITE_OTHER): Payer: 59 | Admitting: Cardiovascular Disease

## 2019-07-20 ENCOUNTER — Other Ambulatory Visit: Payer: Self-pay

## 2019-07-20 VITALS — BP 140/82 | HR 71 | Ht 67.0 in | Wt 131.0 lb

## 2019-07-20 DIAGNOSIS — I6523 Occlusion and stenosis of bilateral carotid arteries: Secondary | ICD-10-CM | POA: Diagnosis not present

## 2019-07-20 DIAGNOSIS — I10 Essential (primary) hypertension: Secondary | ICD-10-CM | POA: Diagnosis not present

## 2019-07-20 DIAGNOSIS — E7800 Pure hypercholesterolemia, unspecified: Secondary | ICD-10-CM

## 2019-07-20 DIAGNOSIS — I25709 Atherosclerosis of coronary artery bypass graft(s), unspecified, with unspecified angina pectoris: Secondary | ICD-10-CM | POA: Diagnosis not present

## 2019-07-20 MED ORDER — LISINOPRIL 20 MG PO TABS: 20.0000 mg | ORAL_TABLET | Freq: Every day | ORAL | 3 refills | Status: DC

## 2019-07-20 MED ORDER — ROSUVASTATIN CALCIUM 20 MG PO TABS: 20.0000 mg | ORAL_TABLET | Freq: Every day | ORAL | 3 refills | Status: DC

## 2019-07-20 MED ORDER — CARVEDILOL 3.125 MG PO TABS: 3.1250 mg | ORAL_TABLET | Freq: Two times a day (BID) | ORAL | 3 refills | Status: DC

## 2019-07-20 MED ORDER — RANOLAZINE ER 500 MG PO TB12: 500.0000 mg | ORAL_TABLET | Freq: Two times a day (BID) | ORAL | 3 refills | Status: DC

## 2019-07-20 MED ORDER — NITROGLYCERIN 0.4 MG SL SUBL: 0.4000 mg | SUBLINGUAL_TABLET | SUBLINGUAL | 3 refills | Status: DC | PRN

## 2019-07-20 NOTE — Patient Instructions (Addendum)
Medication Instructions:  Your physician has recommended you make the following change in your medication:  START Ranexa (Ranolazine) 500 mg twice daily  *If you need a refill on your cardiac medications before your next appointment, please call your pharmacy*  Lab Work: None Ordered If you have labs (blood work) drawn today and your tests are completely normal, you will receive your results only by: Marland Kitchen MyChart Message (if you have MyChart) OR . A paper copy in the mail If you have any lab test that is abnormal or we need to change your treatment, we will call you to review the results.   Testing/Procedures: None Ordered   Follow-Up: At [facility], you and your health needs are our priority.  As part of our continuing mission to provide you with exceptional heart care, we have created designated Provider Care Teams.  These Care Teams include your primary Cardiologist (physician) and Advanced Practice Providers (APPs -  Physician Assistants and Nurse Practitioners) who all work together to provide you with the care you need, when you need it.  Your next appointment:   6 month(s)  The format for your next appointment:   In Person  Provider:   You may see Verne Carrow, MD or one of the following Advanced Practice Providers on your designated Care Team:    Ronie Spies, PA-C  Jacolyn Reedy, PA-C

## 2019-07-20 NOTE — Progress Notes (Signed)
Chief Complaint  Patient presents with  . Follow-up    CAD   History of Present Illness: 64 yo male with h/o CAD s/p 7V CABG in 2000 at [place], HTN, hyperlipidemia, carotid artery disease and prior TIA here today for cardiac follow up. He was seen as a new pt in January 2011 with complaints of chest pain. He had been followed from 2000 until 2011 by Dr. [name] in [place]. He has had frequent episodes of chest pain since I met him in 2011. Cardiac cath in 2011 with 5/7 patent bypass grafts. Stress myoview in August 2015 with no ischemia, LVEF=48%. Last cardiac cath October 2017 with patent LIMA to LAD, patent SVG to OM which filled all of the Circumflex and occluded RCA with occluded SVG to RCA. The RCA filled from left to right collaterals. He underwent left carotid endarterectomy in May 2018 following a TIA. His carotid disease is followed in [facility]. He was seen by virtual visit June 2020 by Richardson Dopp, PA-C with c/o dyspnea. Norvasc added. Nuclear stress test June 2020 with evidence of prior infarct but overall low risk. Echo June 2020 with LVEF=45-50% with segmental wall motion abnormality. No valve disease.   He is here today for follow up. The patient denies any dyspnea, palpitations, lower extremity edema, orthopnea, PND, dizziness, near syncope or syncope. He has weekly sharp, chest pain over his left chest. This is localized to a small spot and there is no associated dyspnea or diaphoresis. He has not used NTG.   Primary Care Physician: Patient, No Pcp Per  Past Medical History:  Diagnosis Date  . CAD (coronary artery disease)    s/p 7 vessel CABG 2000 at [facility] // Echo 11/2018: EF 45-50, antero-apical and inferior akinesis // Myoview 11/2018: EF 51, distal ant/apical infarct and small inferior infarct with very mild peri-infarct ischemia; Low Risk  . Carotid artery occlusion   . High cholesterol   . Hyperlipidemia   . Hypertension   . Pre-diabetes    < 6.1 2017  . Stroke ([facility])  04/2016   TIA- 04/2016 double vision 2nd - speech - no residual  . 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 4v cabg    . CARDIAC CATHETERIZATION    . CARDIAC CATHETERIZATION N/A 03/23/2016   Procedure: Left Heart Cath and Cors/Grafts Angiography;  Surgeon: Burnell Blanks, MD;  Location: [place] [facility];  Service: Cardiovascular;  Laterality: N/A;  . CAROTID ANGIOGRAPHY N/A 10/11/2016   Procedure: Carotid Angiography / Cerebral angiogram;  Surgeon: Conrad Marianne, MD;  Location: [place] [facility];  Service: Cardiovascular;  Laterality: N/A;  . CAROTID ENDARTERECTOMY    . COLONOSCOPY  2017  . COLONOSCOPY W/ POLYPECTOMY    . CORONARY ARTERY BYPASS GRAFT    . ENDARTERECTOMY Left 10/16/2016   Procedure: LEFT CAROTID ENDARTERECTOMY WITH PATCH ANGIOPLASTY;  Surgeon: Conrad West , MD;  Location: [facility];  Service: Vascular;  Laterality: Left;  . patent grafts    . s/p 7    . UPPER GASTROINTESTINAL ENDOSCOPY      Current Outpatient Medications  Medication Sig Dispense Refill  . aspirin 81 MG chewable tablet Chew 81 mg by mouth daily.    . carvedilol (COREG) 3.125 MG tablet Take 1 tablet (3.125 mg total) by mouth 2 (two) times daily with a meal. 180 tablet 3  . lisinopril (ZESTRIL) 20 MG tablet Take 1 tablet (20 mg total) by  mouth daily. 90 tablet 3  . nitroGLYCERIN (NITROSTAT) 0.4 MG SL tablet Place 1 tablet (0.4 mg total) under the tongue every 5 (five) minutes as needed for chest pain (up to 3 doses). 25 tablet 3  . rosuvastatin (CRESTOR) 20 MG tablet Take 1 tablet (20 mg total) by mouth daily. 90 tablet 3  . ranolazine (RANEXA) 500 MG 12 hr tablet Take 1 tablet (500 mg total) by mouth 2 (two) times daily. 180 tablet 3   No current facility-administered medications for this visit.    Allergies  Allergen Reactions  . Amlodipine     Chest pain   . Lisinopril Cough    Social History   Socioeconomic History  . Marital  status: Single    Spouse name: Not on file  . Number of children: 3  . Years of education: AS + 2  . Highest education level: Not on file  Occupational History  . Occupation: [employer]  Tobacco Use  . Smoking status: Former Smoker    Years: 2.00    Quit date: 06/19/1998    Years since quitting: 21.0  . Smokeless tobacco: Never Used  Substance and Sexual Activity  . Alcohol use: No  . Drug use: No  . Sexual activity: Yes  Other Topics Concern  . Not on file  Social History Narrative   Lives at home alone   Right-handed   Caffeine: 2 cups per day   Social Determinants of Health   Financial Resource Strain:   . Difficulty of Paying Living Expenses: Not on file  Food Insecurity:   . Worried About Charity fundraiser in the Last Year: Not on file  . Ran Out of Food in the Last Year: Not on file  Transportation Needs:   . Lack of Transportation (Medical): Not on file  . Lack of Transportation (Non-Medical): Not on file  Physical Activity:   . Days of Exercise per Week: Not on file  . Minutes of Exercise per Session: Not on file  Stress:   . Feeling of Stress : Not on file  Social Connections:   . Frequency of Communication with Friends and Family: Not on file  . Frequency of Social Gatherings with Friends and Family: Not on file  . Attends Religious Services: Not on file  . Active Member of Clubs or Organizations: Not on file  . Attends Archivist Meetings: Not on file  . Marital Status: Not on file  Intimate Partner Violence:   . Fear of Current or Ex-Partner: Not on file  . Emotionally Abused: Not on file  . Physically Abused: Not on file  . Sexually Abused: Not on file    Family History  Problem Relation Age of Onset  . Heart failure Mother   . Hypertension Mother   . Heart disease Mother   . Heart failure Father   . Hypertension Father   . Heart disease Father        before age 74  . Hypertension Sister   . Hypertension Brother   . Heart attack Neg  Hx   . Stroke Neg Hx     Review of Systems:  As stated in the HPI and otherwise negative.   BP 140/82   Pulse 71   Ht _0  (1.702 m)   Wt 131 lb (59.4 kg)   SpO2 98%   BMI 20.52 kg/m   Physical Examination:  General: Well developed, well nourished, NAD  HEENT: OP clear, mucus membranes moist  SKIN: warm, dry. No rashes. Neuro: No focal deficits  Musculoskeletal: Muscle strength 5/5 all ext  Psychiatric: Mood and affect normal  Neck: No JVD, no carotid bruits, no thyromegaly, no lymphadenopathy.  Lungs:Clear bilaterally, no wheezes, rhonci, crackles Cardiovascular: Regular rate and rhythm. No murmurs, gallops or rubs. Abdomen:Soft. Bowel sounds present. Non-tender.  Extremities: No lower extremity edema. Pulses are 2 + in the bilateral DP/PT.  Cardiac cath 03/23/16: 1. Triple vessel CAD s/p 5V CABG with 2/5 patent bypass grafts 2. LAD is occluded mid segment. LIMA graft is patent to the LAD 3. Circumflex is occluded proximally. The free RIMA graft to the first OM is patent and fills the entire Circumflex including all marginal branches.  4. The RCA is occluded proximally. The vein graft to the RCA is now occluded (new since last cath). The distal RCA fills from left to right collaterals.  5. Both SVG grafts to Diagonal 1 and Diagonal 2 are known to be occluded 6. Moderate systolic LV dysfunction, FFMB=84-66%.   Echo June 2020: 1. The left ventricle has mildly reduced systolic function, with an ejection fraction of 45-50%. The cavity size was normal. Left ventricular diastolic parameters were normal. Indeterminate filling pressures No evidence of left ventricular regional wall  motion abnormalities.  2. Severe akinesis of the left ventricular, mid-apical anterior wall and inferior wall.  3. The right ventricle has normal systolic function. The cavity was normal. There is no increase in right ventricular wall thickness.  4. The mitral valve is abnormal. Mild thickening of the  mitral valve leaflet.  5. The tricuspid valve is grossly normal.  6. The aortic valve is tricuspid. No stenosis of the aortic valve.  EKG:  EKG is not ordered today. The EKG demonstrates   Recent Labs: 11/28/2018: BUN 14; Creatinine, Ser 0.78; Hemoglobin 14.5; NT-Pro BNP 229; Platelets 263; Potassium 4.4; Sodium 139 01/29/2019: ALT 18   Lipid Panel    Component Value Date/Time   CHOL 137 01/29/2019 0818   TRIG 108 01/29/2019 0818   HDL 50 01/29/2019 0818   CHOLHDL 2.7 01/29/2019 0818   CHOLHDL 3.4 10/24/2016 1009   VLDL 23 10/24/2016 1009   LDLCALC 65 01/29/2019 0818     Wt Readings from Last 3 Encounters:  07/20/19 131 lb (59.4 kg)  01/05/19 128 lb 1.9 oz (58.1 kg)  12/12/18 135 lb (61.2 kg)     Other studies Reviewed: Additional studies/ records that were reviewed today include: . Review of the above records demonstrates:    Assessment and Plan:   1. CAD s/p CABG with chronic stable angina: No recent changes in his chronic chest pain. Low risk stress test June 2020. Low normal LV systolic function by echo June 2020 with known wall motion abnormality. Last cardiac cath in October 2017 with stable CAD. Will continue ASA, statin and beta blocker. He did not tolerate Norvasc.  Will add Ranexa 500 mg po BID  2. HTN: BP is controlled at home. No changes  3. HLD: LDL at goal. Continue statin.   4. Carotid artery disease: s/p left CEA in 2018. Followed in [facility]  Current medicines are reviewed at length with the patient today.  The patient does not have concerns regarding medicines.  The following changes have been made:  no change  Labs/ tests ordered today include:   No orders of the defined types were placed in this encounter.   Disposition:   FU with me in 6 months  Signed, [name], MD 07/20/2019 2:25  PM    [facility] [place], [place],   [address] Phone: [phone]; Fax: [phone]

## 2019-09-03 DIAGNOSIS — G545 Neuralgic amyotrophy: Secondary | ICD-10-CM | POA: Insufficient documentation

## 2019-09-03 DIAGNOSIS — M4802 Spinal stenosis, cervical region: Secondary | ICD-10-CM | POA: Insufficient documentation

## 2019-09-03 DIAGNOSIS — G5621 Lesion of ulnar nerve, right upper limb: Secondary | ICD-10-CM | POA: Insufficient documentation

## 2019-09-03 DIAGNOSIS — R29898 Other symptoms and signs involving the musculoskeletal system: Secondary | ICD-10-CM | POA: Insufficient documentation

## 2019-09-03 DIAGNOSIS — G5603 Carpal tunnel syndrome, bilateral upper limbs: Secondary | ICD-10-CM | POA: Insufficient documentation

## 2019-09-25 ENCOUNTER — Telehealth: Payer: Self-pay | Admitting: Cardiovascular Disease

## 2019-09-25 NOTE — Telephone Encounter (Signed)
Also patient sent mychart message in regard to this: Good morning  Sir: My blood pressure has been bottoming out in last two days, specially in the morning, every time I squat down. I get dizzy and wobbly, then I have to hang on to something for about 10 seconds,  before walking again. I took my 2nd pfizer vaccine on 09-17-19. My only side effect was sore arm at injection site for a few days, but it is getting better.  Could the low blood pressure be due to the vaccine ? My A1C was checked two weeks ago @6 .2  I have changed my diet to low carb, low sugar, less coffee  and more walking since then. Could that have anything to do with it? I look forward to your great insight/ advice. Than you Sid Sou  Ps. My blood pressure is around 120/68,pulse @67 

## 2019-09-25 NOTE — Telephone Encounter (Signed)
Pt c/o BP issue: STAT if pt c/o blurred vision, one-sided weakness or slurred speech  1. What are your last 5 BP readings? He did not have any readings- said the last few days his blood pressure have been real low- he wanted to make sure you knew that he took his second Vaccine of Pfizer on last Thursday(09-17-19)  2. Are you having any other symptoms (ex. Dizziness, headache, blurred vision, passed out)?  dizziness  3. What is your BP issue?  Running low

## 2019-09-25 NOTE — Telephone Encounter (Signed)
I called the patient.  Adv his symptoms are probably not from the vaccine that he received a week ago. He recently changed to a healthier diet due to A1c being elevated. Drinking 7-8 bottles of water daily. He is noticing the dizzy light headed feeling in the mornings at work when he is squatted down on floor (no chairs) doing paperwork.  When he stands and starts to walk the symptoms come but this only occurs in the mornings.  I adv him to make position changes slowly. Adv to take lisinopril 20 mg at night instead of 5 am.  Pt states has been on this dose for years.  Adv that lisinopril may need reduced if his diet is improved.  He will let us know next week if symptoms improve and how BPs are running.

## 2019-10-08 ENCOUNTER — Telehealth: Payer: Self-pay | Admitting: Cardiovascular Disease

## 2019-10-08 MED ORDER — LISINOPRIL 10 MG PO TABS: 10.0000 mg | ORAL_TABLET | Freq: Every day | ORAL | 3 refills | Status: DC

## 2019-10-08 NOTE — Telephone Encounter (Signed)
Ok to reduce Lisinopril to 10 mg daily. chris

## 2019-10-08 NOTE — Telephone Encounter (Signed)
On 09/25/19 I adv pt to switch lisinopril to at bedtime because he was feeling lightheaded going from sitting to standing while at work in mornings.  This cleared up before lunch each day.  Reviewed patient's MyChart message from today with BP readings.  Pt reporting 108/70, 100/67, requesting to reduce dosage to  10 mg daily.  Recently changed his diet cutting way back on sodium and sugar.  Will route to Dr. [name] to review and for further recommendations.

## 2019-10-08 NOTE — Telephone Encounter (Signed)
  Pt c/o medication issue:  1. Name of Medication: lisinopril (ZESTRIL) 20 MG tablet  2. How are you currently taking this medication (dosage and times per day)? Is taking half the dosage per instructions   3. Are you having a reaction (difficulty breathing--STAT)?  NA  4. What is your medication issue? Patient would like to get a new script called in for a lower dosage since he was told to take half the dose. He thinks the lower dose is working better. He would like the new script called into [facility] [address], [place] - [address]

## 2019-10-08 NOTE — Telephone Encounter (Signed)
New dose of lisinopril sent to patient's pharmacy as requested. Replied to his MyChart message to inform him.

## 2019-10-23 ENCOUNTER — Other Ambulatory Visit: Payer: Self-pay

## 2019-10-26 ENCOUNTER — Encounter: Payer: Self-pay | Admitting: Family Medicine

## 2019-10-26 ENCOUNTER — Ambulatory Visit (INDEPENDENT_AMBULATORY_CARE_PROVIDER_SITE_OTHER): Payer: 59 | Admitting: Family Medicine

## 2019-10-26 ENCOUNTER — Other Ambulatory Visit: Payer: Self-pay

## 2019-10-26 VITALS — BP 124/62 | HR 66 | Temp 97.6°F | Ht 67.0 in | Wt 125.0 lb

## 2019-10-26 DIAGNOSIS — I10 Essential (primary) hypertension: Secondary | ICD-10-CM

## 2019-10-26 DIAGNOSIS — R739 Hyperglycemia, unspecified: Secondary | ICD-10-CM | POA: Diagnosis not present

## 2019-10-26 DIAGNOSIS — E7800 Pure hypercholesterolemia, unspecified: Secondary | ICD-10-CM

## 2019-10-26 DIAGNOSIS — R110 Nausea: Secondary | ICD-10-CM

## 2019-10-26 DIAGNOSIS — I6523 Occlusion and stenosis of bilateral carotid arteries: Secondary | ICD-10-CM

## 2019-10-26 DIAGNOSIS — R42 Dizziness and giddiness: Secondary | ICD-10-CM

## 2019-10-26 NOTE — Progress Notes (Signed)
Subjective:     Patient ID: [name] Sosa, male   DOB: 1955-10-15, 64 y.o.   MRN: [id number]  HPI [name] Sosa is seen to establish care.  He has been followed for several years by our local cardiologist.  He had history of CABG back in 2000.  He has history of carotid artery disease and had left endarterectomy 2018.  He states he had right inguinal hernia repair few months ago.  He has history of hypertension and hyperlipidemia.  He was recently placed on ranolazine per cardiology.  He had improvement in angina type symptoms but unfortunately had some orthostatic type symptoms.  He stopped this several days ago.  He also had recent reduction in his lisinopril dose.  He has history of prediabetes range blood sugars.  He states he was at [facility] recently and had A1c 6.2%.  No polyuria or polydipsia.  He relates a few episodes recently early morning associated nausea.  This seems to be consistently early in the morning and consistently relieved when he eats food.  He does not take any diabetes medications.  He has not check blood sugar episodes.  No associated abdominal pain.  No vomiting.  He states after eating mid morning snack his symptoms generally do not recur.  He cannot correlate his nausea with any medications.  No recent change of appetite.  No melena.  No change in stool habits  Recent orthostatic symptoms as above which have improved after discontinuation ration of ranolazine  Occasional dull bilateral occipital headaches.  Denies any recent fall or injury.  No exertional headaches.  Headaches are relatively mild.  No clear exacerbating factors.  Past Medical History:  Diagnosis Date  . CAD (coronary artery disease)    s/p 7 vessel CABG 2000 at [facility] // Echo 11/2018: EF 45-50, antero-apical and inferior akinesis // Myoview 11/2018: EF 51, distal ant/apical infarct and small inferior infarct with very mild peri-infarct ischemia; Low Risk  . Carotid artery occlusion   . High cholesterol   .  Hyperlipidemia   . Hypertension   . Pre-diabetes    < 6.1 2017  . Stroke ([facility]) 04/2016   TIA- 04/2016 double vision 2nd - speech - no residual  . 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 4v cabg    . CARDIAC CATHETERIZATION    . CARDIAC CATHETERIZATION N/A 03/23/2016   Procedure: Left Heart Cath and Cors/Grafts Angiography;  Surgeon: Kathleene Hazel, MD;  Location: [facility];  Service: Cardiovascular;  Laterality: N/A;  . CAROTID ANGIOGRAPHY N/A 10/11/2016   Procedure: Carotid Angiography / Cerebral angiogram;  Surgeon: Fransisco Hertz, MD;  Location: [facility];  Service: Cardiovascular;  Laterality: N/A;  . CAROTID ENDARTERECTOMY    . COLONOSCOPY  2017  . COLONOSCOPY W/ POLYPECTOMY    . CORONARY ARTERY BYPASS GRAFT    . ENDARTERECTOMY Left 10/16/2016   Procedure: LEFT CAROTID ENDARTERECTOMY WITH PATCH ANGIOPLASTY;  Surgeon: Fransisco Hertz, MD;  Location: [facility] OR;  Service: Vascular;  Laterality: Left;  . patent grafts    . s/p 7    . UPPER GASTROINTESTINAL ENDOSCOPY      reports that he quit smoking about 21 years ago. He quit after 2.00 years of use. He has never used smokeless tobacco. He reports that he does not drink alcohol or use drugs. family history includes Heart disease in his father and mother; Heart failure in his father and mother; Hypertension in  his brother, father, mother, and sister. Allergies  Allergen Reactions  . Amlodipine     Chest pain   . Lisinopril Cough     Review of Systems  Constitutional: Negative for fatigue.  Eyes: Negative for visual disturbance.  Respiratory: Negative for cough, chest tightness and shortness of breath.   Cardiovascular: Negative for chest pain, palpitations and leg swelling.  Gastrointestinal: Positive for nausea. Negative for abdominal pain, diarrhea and vomiting.  Endocrine: Negative for polydipsia and polyuria.  Genitourinary: Negative for dysuria.   Neurological: Positive for dizziness and headaches. Negative for syncope, weakness and light-headedness.       Objective:   Physical Exam Vitals reviewed.  Constitutional:      Appearance: Normal appearance.  Cardiovascular:     Rate and Rhythm: Normal rate and regular rhythm.  Pulmonary:     Effort: Pulmonary effort is normal.     Breath sounds: Normal breath sounds.  Abdominal:     Palpations: Abdomen is soft. There is no mass.     Tenderness: There is no abdominal tenderness.  Musculoskeletal:     Right lower leg: No edema.     Left lower leg: No edema.  Neurological:     General: No focal deficit present.     Mental Status: He is alert and oriented to person, place, and time.     Cranial Nerves: No cranial nerve deficit.        Assessment:     #1 history of CAD.  Previous CABG 2000  #2 history of left carotid endarterectomy  #3 dyslipidemia with goal LDL less than 70  #4 history of prediabetes range blood sugars.  Most recent A1c reportedly 6.2%  #5 recent early morning nausea consistently improved with food intake.  Etiology unclear.  Question related to relative hypoglycemia  #6 recent reported orthostatic type symptoms with no demonstrated drop in blood pressure today.  Seated blood pressure by me 128/68 and standing 130/70    Plan:     -Future order placed for repeat A1c and fasting lipid panel -He is encouraged to follow-up with cardiology regarding his recent orthostatic symptoms.  He did not demonstrate any orthostatic drop today -Consider complete physical at some point later this year  [name] MD [facility]

## 2019-11-25 ENCOUNTER — Telehealth: Payer: Self-pay | Admitting: Cardiovascular Disease

## 2019-11-25 NOTE — Telephone Encounter (Signed)
New Message    Pt c/o BP issue: STAT if pt c/o blurred vision, one-sided weakness or slurred speech  1. What are your last 5 BP readings? 98/64, 146/112   2. Are you having any other symptoms (ex. Dizziness, headache, blurred vision, passed out)? When BP is low he feels these symptoms  Pt is not having the symptoms now   3. What is your BP issue? Pt is calling and would like a sooner appt with Dr [name]. He does not want to see a PA. He called to schedule his follow up appt and stated he did not want to wait until September because he has been experiencing fluctuating BP readings. He says when his BP gets low, he feels dizziness, headache, and blurred vision.  He says today he is okay     Please call

## 2019-11-27 NOTE — Telephone Encounter (Signed)
Patient called again to check on status.

## 2019-11-27 NOTE — Telephone Encounter (Signed)
The patient is calling w concerns about his blood pressure.  He has difficulty controlling this.  First thing in am can be low 90s/ and at 4 am if he wakes and checks it is 160s/  Saw Dr. Caryl Never recently.  Not orthostatic in his office. Wants to see Dr. [name] sooner than 03/16/20 about his BP.  Is agreeable to work with HTN clinic on BP.  Scheduled him for 3:30 on 12/01/19.    Moved up his visit with Dr. [name] to 02/01/20.

## 2019-11-30 NOTE — Progress Notes (Signed)
Patient ID: [name] Sosa                 DOB: 10/17/1955                      MRN: [id number]     HPI: Wilkes Potvin is a 64 y.o. male referred by Dr. [name] to HTN clinic. PMH is significant for CAD s/p 7V CABG in 2000 at [facility], HTN, hyperlipidemia, carotid artery disease, prior TIA, and CHF. Last Echo was 11/2018 with LVEF 45-50% with segmental wall motion abnormality.  He called the clinic last week complaining of blood pressure fluctuations. He said that his SBP was in the 90s at times and then up to 160s. He said that when his BP was low, he had dizziness, headache, and blurred vision.    At last visit with Dr. [name] on 07/20/19, his BP was controlled and no medication changes were made.   Additionally, orthostatics were checked by his PCP on 10/26/19 d/t reported orthostatic hypotension symptoms. Seated BP was 128/68 and standing BP was 130/70. He was told to f/u with cardiology regarding symptoms.   He presents in good spirits today. He takes his BP medications every day at [facility] and 8PM. He usually checks his BP at work around [employer] (nurse aid checks it manually). He gave me sitting and standing BP readings from the past week and there are no major fluctuations between the readings with the exception of one. His most recent BP range was 90-140/50-70s. However, a majority of his readings were between 110-130/70s.   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 He also mentioned that he gets headaches if his BP gets too high (SBP >130). 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 His lisinopril was recently decreased from 20mg  daily to 10mg  daily. Additionally, he has started taking lisinopril at bedtime rather than in the morning and this has not seemed to relieve his symptoms. He has recently started trying to stand up slower which has helped some. Symtoms last about 10  seconds.  Orthostatic hypotension checked today in office. Sitting BP was 118/66 and standing BP was 114/72. We also checked his BP after standing from kneeling down and his BP was 110/74.   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  Past Medical History:  Diagnosis Date  . CAD (coronary artery disease)    s/p 7 vessel CABG 2000 at  [facility] // Echo 11/2018: EF 45-50, antero-apical and inferior akinesis // Myoview 11/2018: EF 51, distal ant/apical infarct and small inferior infarct with very mild peri-infarct ischemia; Low Risk  . Carotid artery occlusion   . High cholesterol   . Hyperlipidemia   . Hypertension   . Pre-diabetes    < 6.1 2017  . Stroke ([place]) 04/2016   TIA- 04/2016 double vision 2nd - speech - no residual  . 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 aspirin 81 MG chewable tablet Chew 81 mg by mouth daily.    . carvedilol (COREG) 3.125 MG tablet Take 1 tablet (3.125 mg total) by mouth 2 (two) times  daily with a meal. 180 tablet 3  . lisinopril (ZESTRIL) 10 MG tablet Take 1 tablet (10 mg total) by mouth daily. 90 tablet 3  . nitroGLYCERIN (NITROSTAT) 0.4 MG SL tablet Place 1 tablet (0.4 mg total) under the tongue every 5 (five) minutes as needed for chest pain (up to 3 doses). 25 tablet 3  . ranolazine (RANEXA) 500 MG 12 hr tablet Take 1 tablet (500 mg total) by mouth 2 (two) times daily. (Patient not taking: Reported on 10/26/2019) 180 tablet 3  . rosuvastatin (CRESTOR) 20 MG tablet Take 1 tablet (20 mg total) by mouth daily. 90 tablet 3   No current facility-administered medications on file prior to visit.    Allergies  Allergen Reactions  . Amlodipine     Chest pain   . Lisinopril Cough    Assessment/Plan:  1. Hypertension - BP controlled today in clinic below goal of <130/80 mmHg - Discussed proper home BP monitoring techniques - Discussed importance of standing up slowly to prevent orthostatic hypotension' - Discussed taking lisinopril with lunch time instead of evening since he complained of more dizziness in the morning compared to the afternoon - Continue lisinopril 10mg  daily - Continue carvedilol 3.125mg  BID - Consider further reducing the dose of lisinopril or switching to an ARB if his orthostatic hypotension symptoms persist even though recent trends have not indicated a significant BP drop upon standing - F/u in 2 months with Dr. Adela Lank, PharmD PGY1 Ambulatory Care Pharmacy Resident

## 2019-12-01 ENCOUNTER — Other Ambulatory Visit: Payer: Self-pay

## 2019-12-01 ENCOUNTER — Ambulatory Visit (INDEPENDENT_AMBULATORY_CARE_PROVIDER_SITE_OTHER): Payer: 59 | Admitting: Pharmacist

## 2019-12-01 VITALS — BP 110/74 | HR 77

## 2019-12-01 DIAGNOSIS — I10 Essential (primary) hypertension: Secondary | ICD-10-CM

## 2019-12-01 MED ORDER — LISINOPRIL 10 MG PO TABS: 10.0000 mg | ORAL_TABLET | Freq: Every day | ORAL | 11 refills | Status: DC

## 2019-12-01 NOTE — Patient Instructions (Addendum)
It was a pleasure meeting you today.  Your blood pressure was good today. It did not appear to drop too low when you stood up.  Try to stand up slowly if you can to reduce the chances of your blood pressure dropping too low.  Try taking your lisinopril at lunch time instead. See if this improves your blood pressure. If not we may discuss decreasing your lisinopril dose or changing you to another blood pressure medication.  Call us if you have questions or concerns. [phone]

## 2019-12-14 ENCOUNTER — Other Ambulatory Visit: Payer: Self-pay | Admitting: Physician Assistant

## 2019-12-14 DIAGNOSIS — E7800 Pure hypercholesterolemia, unspecified: Secondary | ICD-10-CM

## 2019-12-16 ENCOUNTER — Telehealth: Payer: Self-pay | Admitting: Cardiovascular Disease

## 2019-12-16 DIAGNOSIS — E7800 Pure hypercholesterolemia, unspecified: Secondary | ICD-10-CM

## 2019-12-16 MED ORDER — LISINOPRIL 10 MG PO TABS: 10.0000 mg | ORAL_TABLET | Freq: Every day | ORAL | 2 refills | Status: DC

## 2019-12-16 MED ORDER — ROSUVASTATIN CALCIUM 20 MG PO TABS: 20.0000 mg | ORAL_TABLET | Freq: Every day | ORAL | 2 refills | Status: DC

## 2019-12-16 NOTE — Telephone Encounter (Signed)
*  STAT* If patient is at the pharmacy, call can be transferred to refill team.   1. Which medications need to be refilled? (please list name of each medication and dose if known)  rosuvastatin (CRESTOR) 20 MG tablet lisinopril (ZESTRIL) 10 MG tablet  2. Which pharmacy/location (including street and city if local pharmacy) is medication to be sent to? [facility] [address] [place] - [address]  3. Do they need a 30 day or 90 day supply? 90 day

## 2019-12-16 NOTE — Telephone Encounter (Signed)
Pt's medications were sent to pt's pharmacy as requested. Confirmation received.  

## 2020-01-04 ENCOUNTER — Encounter: Payer: Self-pay | Admitting: Family Medicine

## 2020-01-04 ENCOUNTER — Ambulatory Visit (INDEPENDENT_AMBULATORY_CARE_PROVIDER_SITE_OTHER): Payer: 59 | Admitting: Family Medicine

## 2020-01-04 ENCOUNTER — Other Ambulatory Visit: Payer: Self-pay

## 2020-01-04 VITALS — BP 130/70 | HR 61 | Temp 97.7°F | Ht 67.0 in | Wt 128.5 lb

## 2020-01-04 DIAGNOSIS — E7800 Pure hypercholesterolemia, unspecified: Secondary | ICD-10-CM | POA: Diagnosis not present

## 2020-01-04 DIAGNOSIS — I10 Essential (primary) hypertension: Secondary | ICD-10-CM | POA: Diagnosis not present

## 2020-01-04 DIAGNOSIS — R42 Dizziness and giddiness: Secondary | ICD-10-CM | POA: Diagnosis not present

## 2020-01-04 NOTE — Progress Notes (Signed)
Established Patient Office Visit  Subjective:  Patient ID: [name] Sosa, male    DOB: 10-08-1955  Age: 64 y.o. MRN: [id number]  CC:  Chief Complaint  Patient presents with   Dizziness    intermittently x3 months    HPI [name] Sosa presents for some continued intermittent dizziness when going from squatting position to standing.  Refer to prior note for details.  He had been seen through the cardiology hypertension clinic but no medication changes were made.  We had obtained recent orthostatics and also they were checked through cardiology and none noted.  His symptoms seem to be worse in the morning.  He symptoms squats at work and was standing as lightheadedness.  No actual syncope.  He drinks coffee in the morning but not a lot of water.  Does not describe any vertigo symptoms  He thought that his symptoms may be related to Ranexa but he held this a few weeks ago and symptoms have not resolved.  Denies any active chest pain symptoms.  He takes low-dose carvedilol 3.125 mg twice daily and lisinopril 10 mg daily.  He has hyperlipidemia treated with Crestor.  Requesting follow-up labs today.  He has had some daytime somnolence and fatigue.  Sleep sometimes interrupted 4 urine frequency at night.  Past Medical History:  Diagnosis Date   CAD (coronary artery disease)    s/p 7 vessel CABG 2000 at 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    Past Surgical History:  Procedure Laterality Date   4v cabg     CARDIAC CATHETERIZATION     CARDIAC CATHETERIZATION N/A 03/23/2016   Procedure: Left  Heart Cath and Cors/Grafts Angiography;  Surgeon: Kathleene Hazel, MD;  Location: [facility] INVASIVE CV LAB;  Service: Cardiovascular;  Laterality: N/A;   CAROTID ANGIOGRAPHY N/A 10/11/2016   Procedure: Carotid Angiography / Cerebral angiogram;  Surgeon: Fransisco Hertz, MD;  Location: [facility] INVASIVE CV LAB;  Service: Cardiovascular;  Laterality: N/A;   CAROTID ENDARTERECTOMY     COLONOSCOPY  2017   COLONOSCOPY W/ POLYPECTOMY     CORONARY ARTERY BYPASS GRAFT     ENDARTERECTOMY Left 10/16/2016   Procedure: LEFT CAROTID ENDARTERECTOMY WITH PATCH ANGIOPLASTY;  Surgeon: Fransisco Hertz, MD;  Location: [facility] OR;  Service: Vascular;  Laterality: Left;   patent grafts     s/p 7     UPPER GASTROINTESTINAL ENDOSCOPY      Family History  Problem Relation Age of Onset   Heart failure Mother    Hypertension Mother    Heart disease Mother    Heart failure Father    Hypertension Father    Heart disease Father        before age 49   Hypertension Sister    Hypertension Brother    Heart attack Neg Hx    Stroke Neg Hx     Social History   Socioeconomic History   Marital status: Single    Spouse name: Not on file   Number of children: 3   Years of education: AS + 2  Highest education level: Not on file  Occupational History   Occupation: [employer]  Tobacco Use   Smoking status: Former Smoker    Years: 2.00    Quit date: 06/19/1998    Years since quitting: 21.5   Smokeless tobacco: Never Used  Building services engineer Use: Never used  Substance and Sexual Activity   Alcohol use: No   Drug use: No   Sexual activity: Yes  Other Topics Concern   Not on file  Social History Narrative   Lives at home alone   Right-handed   Caffeine: 2 cups per day   Social Determinants of Health   Financial Resource Strain:    Difficulty of Paying Living Expenses:   Food Insecurity:    Worried About Programme researcher, broadcasting/film/video in the Last Year:    Barista in the Last Year:    Transportation Needs:    Freight forwarder (Medical):    Lack of Transportation (Non-Medical):   Physical Activity:    Days of Exercise per Week:    Minutes of Exercise per Session:   Stress:    Feeling of Stress :   Social Connections:    Frequency of Communication with Friends and Family:    Frequency of Social Gatherings with Friends and Family:    Attends Religious Services:    Active Member of Clubs or Organizations:    Attends Engineer, structural:    Marital Status:   Intimate Partner Violence:    Fear of Current or Ex-Partner:    Emotionally Abused:    Physically Abused:    Sexually Abused:     Outpatient Medications Prior to Visit  Medication Sig Dispense Refill   aspirin 81 MG chewable tablet Chew 81 mg by mouth daily.     carvedilol (COREG) 3.125 MG tablet Take 1 tablet (3.125 mg total) by mouth 2 (two) times daily with a meal. 180 tablet 3   lisinopril (ZESTRIL) 10 MG tablet Take 1 tablet (10 mg total) by mouth daily. 90 tablet 2   nitroGLYCERIN (NITROSTAT) 0.4 MG SL tablet Place 1 tablet (0.4 mg total) under the tongue every 5 (five) minutes as needed for chest pain (up to 3 doses). 25 tablet 3   ranolazine (RANEXA) 500 MG 12 hr tablet Take 1 tablet (500 mg total) by mouth 2 (two) times daily. 180 tablet 3   rosuvastatin (CRESTOR) 20 MG tablet Take 1 tablet (20 mg total) by mouth daily. 90 tablet 2   No facility-administered medications prior to visit.    Allergies  Allergen Reactions   Amlodipine     Chest pain    Lisinopril Cough    ROS Review of Systems  Constitutional: Positive for fatigue. Negative for appetite change, chills, fever and unexpected weight change.  Respiratory: Negative for cough.   Cardiovascular: Negative for chest pain, palpitations and leg swelling.  Neurological: Positive for dizziness. Negative for tremors, seizures, syncope, speech difficulty and headaches.  Hematological: Negative for  adenopathy. Does not bruise/bleed easily.      Objective:    Physical Exam Vitals reviewed.  Constitutional:      Appearance: Normal appearance.  Cardiovascular:     Rate and Rhythm: Normal rate and regular rhythm.  Pulmonary:     Effort: Pulmonary effort is normal.     Breath sounds: Normal breath sounds.  Musculoskeletal:     Right lower leg: No edema.     Left lower leg: No edema.  Neurological:  General: No focal deficit present.     Mental Status: He is alert.     Cranial Nerves: No cranial nerve deficit.     Motor: No weakness.     BP 130/70 (BP Location: Left Arm, Patient Position: Sitting, Cuff Size: Normal)    Pulse 61    Temp 97.7 F (36.5 C) (Oral)    Ht 5\' 7"  (1.702 m)    Wt 128 lb 8 oz (58.3 kg)    SpO2 98%    BMI 20.13 kg/m  Wt Readings from Last 3 Encounters:  01/04/20 128 lb 8 oz (58.3 kg)  10/26/19 125 lb (56.7 kg)  07/20/19 131 lb (59.4 kg)     Health Maintenance Due  Topic Date Due   TETANUS/TDAP  06/18/2018    There are no preventive care reminders to display for this patient.  Lab Results  Component Value Date   TSH 0.57 10/24/2016   Lab Results  Component Value Date   WBC 5.9 11/28/2018   HGB 14.5 11/28/2018   HCT 42.6 11/28/2018   MCV 92 11/28/2018   PLT 263 11/28/2018   Lab Results  Component Value Date   NA 139 11/28/2018   K 4.4 11/28/2018   CO2 25 11/28/2018   GLUCOSE 86 11/28/2018   BUN 14 11/28/2018   CREATININE 0.78 11/28/2018   BILITOT 0.6 01/29/2019   ALKPHOS 54 01/29/2019   AST 25 01/29/2019   ALT 18 01/29/2019   PROT 6.7 01/29/2019   ALBUMIN 4.5 01/29/2019   CALCIUM 9.7 11/28/2018   ANIONGAP 7 10/17/2016   GFR 108.45 03/16/2015   Lab Results  Component Value Date   CHOL 137 01/29/2019   Lab Results  Component Value Date   HDL 50 01/29/2019   Lab Results  Component Value Date   LDLCALC 65 01/29/2019   Lab Results  Component Value Date   TRIG 108 01/29/2019   Lab Results  Component Value Date    CHOLHDL 2.7 01/29/2019   Lab Results  Component Value Date   HGBA1C 5.9 (A) 11/20/2017      Assessment & Plan:   #1  Dizziness.  He has had some persistent symptoms for several weeks now.  This is consistently when going from squatting to standing.  He has not shown demonstrated orthostatic drops either through our office or cardiology.  -We have reiterated the importance of changing positions slowly and also recommend he drink more water early in the mornings instead of just coffee -Check basic metabolic panel  #2 hyperlipidemia.  Goal LDL less than 70  -Recheck lipid and hepatic panel  #3 hypertension-controlled  No orders of the defined types were placed in this encounter.   Follow-up: No follow-ups on file.    01/20/2018, MD

## 2020-01-05 LAB — LIPID PANEL
Cholesterol: 142 mg/dL (ref <200)
HDL: 53 mg/dL (ref >=40)
LDL Cholesterol (Calc): 67 mg/dL (calc)
Non-HDL Cholesterol (Calc): 89 mg/dL (calc) (ref <130)
Total CHOL/HDL Ratio: 2.7 (calc) (ref <5.0)
Triglycerides: 134 mg/dL (ref <150)

## 2020-01-05 LAB — HEPATIC FUNCTION PANEL
AG Ratio: 1.8 (calc) (ref 1.0–2.5)
ALT: 21 U/L (ref 9–46)
AST: 22 U/L (ref 10–35)
Albumin: 4.4 g/dL (ref 3.6–5.1)
Alkaline phosphatase (APISO): 50 U/L (ref 35–144)
Bilirubin, Direct: 0.1 mg/dL (ref 0.0–0.2)
Globulin: 2.4 g/dL (calc) (ref 1.9–3.7)
Indirect Bilirubin: 0.3 mg/dL (calc) (ref 0.2–1.2)
Total Bilirubin: 0.4 mg/dL (ref 0.2–1.2)
Total Protein: 6.8 g/dL (ref 6.1–8.1)

## 2020-01-05 LAB — HEMOGLOBIN A1C
Hgb A1c MFr Bld: 5.7 % of total Hgb — ABNORMAL HIGH (ref <5.7)
Mean Plasma Glucose: 117 (calc)
eAG (mmol/L): 6.5 (calc)

## 2020-01-05 LAB — BASIC METABOLIC PANEL
BUN/Creatinine Ratio: 21 (calc) (ref 6–22)
BUN: 14 mg/dL (ref 7–25)
CO2: 32 mmol/L (ref 20–32)
Calcium: 10 mg/dL (ref 8.6–10.3)
Chloride: 106 mmol/L (ref 98–110)
Creat: 0.68 mg/dL — ABNORMAL LOW (ref 0.70–1.25)
Glucose, Bld: 96 mg/dL (ref 65–99)
Potassium: 4.5 mmol/L (ref 3.5–5.3)
Sodium: 140 mmol/L (ref 135–146)

## 2020-01-05 LAB — MAGNESIUM: Magnesium: 2.1 mg/dL (ref 1.5–2.5)

## 2020-01-14 ENCOUNTER — Encounter: Payer: Self-pay | Admitting: Family Medicine

## 2020-01-14 ENCOUNTER — Telehealth: Payer: Self-pay | Admitting: Family Medicine

## 2020-01-14 NOTE — Telephone Encounter (Signed)
Pt would like to know if he can have a prescription for compression socks. Per pt, he had discussed this at his last meeting with Dr. Caryl Never.

## 2020-01-15 NOTE — Telephone Encounter (Signed)
Sent Dr.Burchette pt advise

## 2020-01-22 ENCOUNTER — Encounter: Payer: Self-pay | Admitting: Family Medicine

## 2020-02-01 ENCOUNTER — Other Ambulatory Visit: Payer: Self-pay

## 2020-02-01 ENCOUNTER — Encounter: Payer: Self-pay | Admitting: Cardiovascular Disease

## 2020-02-01 ENCOUNTER — Ambulatory Visit (INDEPENDENT_AMBULATORY_CARE_PROVIDER_SITE_OTHER): Payer: 59 | Admitting: Cardiovascular Disease

## 2020-02-01 VITALS — BP 118/62 | HR 72 | Ht 67.0 in | Wt 124.0 lb

## 2020-02-01 DIAGNOSIS — I25709 Atherosclerosis of coronary artery bypass graft(s), unspecified, with unspecified angina pectoris: Secondary | ICD-10-CM

## 2020-02-01 DIAGNOSIS — I6523 Occlusion and stenosis of bilateral carotid arteries: Secondary | ICD-10-CM | POA: Diagnosis not present

## 2020-02-01 DIAGNOSIS — I10 Essential (primary) hypertension: Secondary | ICD-10-CM | POA: Diagnosis not present

## 2020-02-01 DIAGNOSIS — E7800 Pure hypercholesterolemia, unspecified: Secondary | ICD-10-CM

## 2020-02-01 NOTE — Progress Notes (Signed)
Chief Complaint  Patient presents with  . Follow-up    CAD   History of Present Illness: 64 yo male with h/o CAD s/p 7V CABG in 2000 at [place], HTN, hyperlipidemia, carotid artery disease and prior TIA here today for cardiac follow up. He was seen as a new pt in January 2011 with complaints of chest pain. He had been followed from 2000 until 2011 by Dr. [name] in [place]. He has had frequent episodes of chest pain since I met him in 2011. Cardiac cath in 2011 with 5/7 patent bypass grafts. Stress myoview in August 2015 with no ischemia, LVEF=48%. Last cardiac cath October 2017 with patent LIMA to LAD, patent SVG to OM which filled all of the Circumflex and occluded RCA with occluded SVG to RCA. The RCA filled from left to right collaterals. He underwent left carotid endarterectomy in May 2018 following a TIA. His carotid disease is followed in [facility]. He was seen by virtual visit June 2020 by Richardson Dopp, PA-C with c/o dyspnea. Norvasc added. Nuclear stress test June 2020 with evidence of prior infarct but overall low risk. Echo June 2020 with LVEF=45-50% with segmental wall motion abnormality. No valve disease.   He is here today for follow up. The patient denies any dyspnea, palpitations, lower extremity edema, orthopnea, PND, dizziness, near syncope or syncope. He has rare chest pains. He has had some dizziness with standing. He has been wearing compression stockings per Dr. Elease Hashimoto. Also making sure he drinks plenty of fluids.   Primary Care Physician: [name], MD  Past Medical History:  Diagnosis Date  . CAD (coronary artery disease)    s/p 7 vessel CABG 2000 at [facility] // Echo 11/2018: EF 45-50, antero-apical and inferior akinesis // Myoview 11/2018: EF 51, distal ant/apical infarct and small inferior infarct with very mild peri-infarct ischemia; Low Risk  . Carotid artery occlusion   . High cholesterol   . Hyperlipidemia   . Hypertension   . Pre-diabetes    < 6.1 2017  .  Stroke ([place]) 04/2016   TIA- 04/2016 double vision 2nd - speech - no residual  . 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 4v cabg    . CARDIAC CATHETERIZATION    . CARDIAC CATHETERIZATION N/A 03/23/2016   Procedure: Left Heart Cath and Cors/Grafts Angiography;  Surgeon: Burnell Blanks, MD;  Location: [facility];  Service: Cardiovascular;  Laterality: N/A;  . CAROTID ANGIOGRAPHY N/A 10/11/2016   Procedure: Carotid Angiography / Cerebral angiogram;  Surgeon: Conrad Marion, MD;  Location: [facility];  Service: Cardiovascular;  Laterality: N/A;  . CAROTID ENDARTERECTOMY    . COLONOSCOPY  2017  . COLONOSCOPY W/ POLYPECTOMY    . CORONARY ARTERY BYPASS GRAFT    . ENDARTERECTOMY Left 10/16/2016   Procedure: LEFT CAROTID ENDARTERECTOMY WITH PATCH ANGIOPLASTY;  Surgeon: Conrad Cornish, MD;  Location: [facility];  Service: Vascular;  Laterality: Left;  . patent grafts    . s/p 7    . UPPER GASTROINTESTINAL ENDOSCOPY      Current Outpatient Medications  Medication Sig Dispense Refill  . aspirin 81 MG chewable tablet Chew 81 mg by mouth daily.    . carvedilol (COREG) 3.125 MG tablet Take 1 tablet (3.125 mg total) by mouth 2 (two) times daily with a meal. 180 tablet 3  . lisinopril (ZESTRIL) 10 MG tablet Take 1 tablet (10 mg total) by mouth  daily. 90 tablet 2  . nitroGLYCERIN (NITROSTAT) 0.4 MG SL tablet Place 1 tablet (0.4 mg total) under the tongue every 5 (five) minutes as needed for chest pain (up to 3 doses). 25 tablet 3  . ranolazine (RANEXA) 500 MG 12 hr tablet Take 500 mg by mouth as needed.    . rosuvastatin (CRESTOR) 20 MG tablet Take 1 tablet (20 mg total) by mouth daily. 90 tablet 2   No current facility-administered medications for this visit.    Allergies  Allergen Reactions  . Amlodipine     Chest pain   . Lisinopril Cough    Social History   Socioeconomic History  . Marital status: Single    Spouse name:  Not on file  . Number of children: 3  . Years of education: AS + 2  . Highest education level: Not on file  Occupational History  . Occupation: [employer]  Tobacco Use  . Smoking status: Former Smoker    Years: 2.00    Quit date: 06/19/1998    Years since quitting: 21.6  . Smokeless tobacco: Never Used  Vaping Use  . Vaping Use: Never used  Substance and Sexual Activity  . Alcohol use: No  . Drug use: No  . Sexual activity: Yes  Other Topics Concern  . Not on file  Social History Narrative   Lives at home alone   Right-handed   Caffeine: 2 cups per day   Social Determinants of Health   Financial Resource Strain:   . Difficulty of Paying Living Expenses:   Food Insecurity:   . Worried About Charity fundraiser in the Last Year:   . Arboriculturist in the Last Year:   Transportation Needs:   . Film/video editor (Medical):   Marland Kitchen Lack of Transportation (Non-Medical):   Physical Activity:   . Days of Exercise per Week:   . Minutes of Exercise per Session:   Stress:   . Feeling of Stress :   Social Connections:   . Frequency of Communication with Friends and Family:   . Frequency of Social Gatherings with Friends and Family:   . Attends Religious Services:   . Active Member of Clubs or Organizations:   . Attends Archivist Meetings:   Marland Kitchen Marital Status:   Intimate Partner Violence:   . Fear of Current or Ex-Partner:   . Emotionally Abused:   Marland Kitchen Physically Abused:   . Sexually Abused:     Family History  Problem Relation Age of Onset  . Heart failure Mother   . Hypertension Mother   . Heart disease Mother   . Heart failure Father   . Hypertension Father   . Heart disease Father        before age 70  . Hypertension Sister   . Hypertension Brother   . Heart attack Neg Hx   . Stroke Neg Hx     Review of Systems:  As stated in the HPI and otherwise negative.   BP 118/62   Pulse 72   Ht '5\' 7"'$  (1.702 m)   Wt 124 lb (56.2 kg)   SpO2 96%   BMI 19.42  kg/m   Physical Examination:  General: Well developed, well nourished, NAD  HEENT: OP clear, mucus membranes moist  SKIN: warm, dry. No rashes. Neuro: No focal deficits  Musculoskeletal: Muscle strength 5/5 all ext  Psychiatric: Mood and affect normal  Neck: No JVD, no carotid bruits, no thyromegaly, no lymphadenopathy.  Lungs:Clear bilaterally, no wheezes, rhonci, crackles Cardiovascular: Regular rate and rhythm. No murmurs, gallops or rubs. Abdomen:Soft. Bowel sounds present. Non-tender.  Extremities: No lower extremity edema. Pulses are 2 + in the bilateral DP/PT.  Cardiac cath 03/23/16: 1. Triple vessel CAD s/p 5V CABG with 2/5 patent bypass grafts 2. LAD is occluded mid segment. LIMA graft is patent to the LAD 3. Circumflex is occluded proximally. The free RIMA graft to the first OM is patent and fills the entire Circumflex including all marginal branches.  4. The RCA is occluded proximally. The vein graft to the RCA is now occluded (new since last cath). The distal RCA fills from left to right collaterals.  5. Both SVG grafts to Diagonal 1 and Diagonal 2 are known to be occluded 6. Moderate systolic LV dysfunction, XBJY=78-29%.   Echo June 2020: 1. The left ventricle has mildly reduced systolic function, with an ejection fraction of 45-50%. The cavity size was normal. Left ventricular diastolic parameters were normal. Indeterminate filling pressures No evidence of left ventricular regional wall  motion abnormalities.  2. Severe akinesis of the left ventricular, mid-apical anterior wall and inferior wall.  3. The right ventricle has normal systolic function. The cavity was normal. There is no increase in right ventricular wall thickness.  4. The mitral valve is abnormal. Mild thickening of the mitral valve leaflet.  5. The tricuspid valve is grossly normal.  6. The aortic valve is tricuspid. No stenosis of the aortic valve.  EKG:  EKG is not  ordered today. The EKG demonstrates    Recent Labs: 01/04/2020: ALT 21; BUN 14; Creat 0.68; Magnesium 2.1; Potassium 4.5; Sodium 140   Lipid Panel    Component Value Date/Time   CHOL 142 01/04/2020 1553   CHOL 137 01/29/2019 0818   TRIG 134 01/04/2020 1553   HDL 53 01/04/2020 1553   HDL 50 01/29/2019 0818   CHOLHDL 2.7 01/04/2020 1553   VLDL 23 10/24/2016 1009   LDLCALC 67 01/04/2020 1553     Wt Readings from Last 3 Encounters:  02/01/20 124 lb (56.2 kg)  01/04/20 128 lb 8 oz (58.3 kg)  10/26/19 125 lb (56.7 kg)     Other studies Reviewed: Additional studies/ records that were reviewed today include: . Review of the above records demonstrates:    Assessment and Plan:   1. CAD s/p CABG with chronic stable angina: He has chronic chest pain with no recent changes. Low risk stress test June 2020. Low normal LV systolic function by echo June 2020 with known wall motion abnormality. Last cardiac cath in October 2017 with stable CAD. He did not tolerate Norvasc.  Continue ASA, statin, Ranexa and beta blocker.   2. HTN: BP is controlled. No changes in current therapy  3. HLD: LDL at goal. Will continue statin  4. Carotid artery disease: s/p left CEA in 2018. Followed in [facility]. Will refer back to [facility]. Last seen in 2019 by Dr. Bridgett Larsson.   Current medicines are reviewed at length with the patient today.  The patient does not have concerns regarding medicines.  The following changes have been made:  no change  Labs/ tests ordered today include:   Orders Placed This Encounter  Procedures  . Ambulatory referral to Vascular Surgery    Disposition:   FU with me in 12 months  Signed, [name], MD 02/01/2020 2:47 PM    [address], [place], [place]  [address] Phone: [phone]; Fax: [phone]

## 2020-02-01 NOTE — Patient Instructions (Signed)
Medication Instructions:  Your provider recommends that you continue on your current medications as directed. Please refer to the Current Medication list given to you today.   *If you need a refill on your cardiac medications before your next appointment, please call your pharmacy*   Follow-Up: You have been referred to [facility] to reestablish care for your carotid disease.  At [facility], you and your health needs are our priority.  As part of our continuing mission to provide you with exceptional heart care, we have created designated Provider Care Teams.  These Care Teams include your primary Cardiologist (physician) and Advanced Practice Providers (APPs -  Physician Assistants and Nurse Practitioners) who all work together to provide you with the care you need, when you need it. Your next appointment:   12 month(s) The format for your next appointment:   In Person Provider:   You may see Verne Carrow, MD or one of the following Advanced Practice Providers on your designated Care Team:    Tereso Newcomer, PA-C  [place], [place]

## 2020-02-19 LAB — HM DIABETES EYE EXAM

## 2020-02-29 ENCOUNTER — Encounter (HOSPITAL_COMMUNITY): Payer: 59

## 2020-02-29 ENCOUNTER — Ambulatory Visit: Payer: 59

## 2020-02-29 DIAGNOSIS — Z9889 Other specified postprocedural states: Secondary | ICD-10-CM | POA: Insufficient documentation

## 2020-03-02 ENCOUNTER — Encounter: Payer: Self-pay | Admitting: Family Medicine

## 2020-03-16 ENCOUNTER — Ambulatory Visit: Payer: 59 | Admitting: Cardiovascular Disease

## 2020-08-05 ENCOUNTER — Telehealth: Payer: Self-pay | Admitting: Cardiovascular Disease

## 2020-08-05 ENCOUNTER — Other Ambulatory Visit: Payer: Self-pay | Admitting: Cardiovascular Disease

## 2020-08-05 NOTE — Telephone Encounter (Signed)
°*  STAT* If patient is at the pharmacy, call can be transferred to refill team.   1. Which medications need to be refilled? (please list name of each medication and dose if known) Ranolazine  2. Which pharmacy/location (including street and city if local pharmacy) is medication to be sent to?[facility] [address], [place]  3. Do they need a 30 day or 90 day supply? 90 days and refills

## 2020-08-18 ENCOUNTER — Other Ambulatory Visit: Payer: Self-pay | Admitting: Cardiovascular Disease

## 2020-08-29 ENCOUNTER — Encounter: Payer: Self-pay | Admitting: Family Medicine

## 2020-08-29 DIAGNOSIS — Z1211 Encounter for screening for malignant neoplasm of colon: Secondary | ICD-10-CM

## 2020-09-05 ENCOUNTER — Other Ambulatory Visit: Payer: Self-pay | Admitting: Cardiovascular Disease

## 2020-09-05 DIAGNOSIS — E7800 Pure hypercholesterolemia, unspecified: Secondary | ICD-10-CM

## 2020-09-27 ENCOUNTER — Ambulatory Visit: Payer: 59 | Admitting: Family Medicine

## 2020-10-04 ENCOUNTER — Encounter: Payer: Self-pay | Admitting: Family Medicine

## 2020-10-04 ENCOUNTER — Other Ambulatory Visit: Payer: Self-pay

## 2020-10-04 ENCOUNTER — Ambulatory Visit (INDEPENDENT_AMBULATORY_CARE_PROVIDER_SITE_OTHER): Payer: 59 | Admitting: Family Medicine

## 2020-10-04 VITALS — BP 118/60 | HR 70 | Temp 97.8°F | Wt 131.9 lb

## 2020-10-04 DIAGNOSIS — I10 Essential (primary) hypertension: Secondary | ICD-10-CM | POA: Diagnosis not present

## 2020-10-04 DIAGNOSIS — K4090 Unilateral inguinal hernia, without obstruction or gangrene, not specified as recurrent: Secondary | ICD-10-CM | POA: Diagnosis not present

## 2020-10-04 DIAGNOSIS — L600 Ingrowing nail: Secondary | ICD-10-CM | POA: Diagnosis not present

## 2020-10-04 DIAGNOSIS — E7800 Pure hypercholesterolemia, unspecified: Secondary | ICD-10-CM

## 2020-10-04 DIAGNOSIS — R21 Rash and other nonspecific skin eruption: Secondary | ICD-10-CM

## 2020-10-04 MED ORDER — CLOTRIMAZOLE-BETAMETHASONE 1-0.05 % EX CREA: 1.0000 "application " | TOPICAL_CREAM | Freq: Every day | CUTANEOUS | 1 refills | Status: DC

## 2020-10-04 NOTE — Patient Instructions (Signed)
Inguinal Hernia, Adult An inguinal hernia develops when fat or the intestines push through a weak spot in a muscle where the leg meets the lower abdomen (groin). This creates a bulge. This kind of hernia could also be:  In the scrotum, if you are male.  In folds of skin around the vagina, if you are male. There are three types of inguinal hernias:  Hernias that can be pushed back into the abdomen (are reducible). This type rarely causes pain.  Hernias that are not reducible (are incarcerated).  Hernias that are not reducible and lose their blood supply (are strangulated). This type of hernia requires emergency surgery. What are the causes? This condition is caused by having a weak spot in the muscles or tissues in your groin. This develops over time. The hernia may poke through the weak spot when you suddenly strain your lower abdominal muscles, such as when you:  Lift a heavy object.  Strain to have a bowel movement. Constipation can lead to straining.  Cough. What increases the risk? This condition is more likely to develop in:  Males.  Pregnant females.  People who: ? Are overweight. ? Work in jobs that require long periods of standing or heavy lifting. ? Have had an inguinal hernia before. ? Smoke or have lung disease. These factors can lead to long-term (chronic) coughing. What are the signs or symptoms? Symptoms may depend on the size of the hernia. Often, a small inguinal hernia has no symptoms. Symptoms of a larger hernia may include:  A bulge in the groin area. This is easier to see when standing. It might not be visible when lying down.  Pain or burning in the groin. This may get worse when lifting, straining, or coughing.  A dull ache or a feeling of pressure in the groin.  An unusual bulge in the scrotum, in males. Symptoms of a strangulated inguinal hernia may include:  A bulge in your groin that is very painful and tender to the touch.  A bulge that  turns red or purple.  Fever, nausea, and vomiting.  Inability to have a bowel movement or to pass gas. How is this diagnosed? This condition is diagnosed based on your symptoms, your medical history, and a physical exam. Your health care provider may feel your groin area and ask you to cough. How is this treated? Treatment depends on the size of your hernia and whether you have symptoms. If you do not have symptoms, your health care provider may have you watch your hernia carefully and have you come in for follow-up visits. If your hernia is large or if you have symptoms, you may need surgery to repair the hernia. Follow these instructions at home: Lifestyle  Avoid lifting heavy objects.  Avoid standing for long periods of time.  Do not use any products that contain nicotine or tobacco. These products include cigarettes, chewing tobacco, and vaping devices, such as e-cigarettes. If you need help quitting, ask your health care provider.  Maintain a healthy weight. Preventing constipation You may need to take these actions to prevent or treat constipation:  Drink enough fluid to keep your urine pale yellow.  Take over-the-counter or prescription medicines.  Eat foods that are high in fiber, such as beans, whole grains, and fresh fruits and vegetables.  Limit foods that are high in fat and processed sugars, such as fried or sweet foods. General instructions  You may try to push the hernia back in place by very gently   pressing on it while lying down. Do not try to force the bulge back in if it will not push in easily.  Watch your hernia for any changes in shape, size, or color. Get help right away if you notice any changes.  Take over-the-counter and prescription medicines only as told by your health care provider.  Keep all follow-up visits. This is important. Contact a health care provider if:  You have a fever or chills.  You develop new symptoms.  Your symptoms get  worse. Get help right away if:  You have pain in your groin that suddenly gets worse.  You have a bulge in your groin that: ? Suddenly gets bigger and does not get smaller. ? Becomes red or purple or painful to the touch.  You are a man and you have a sudden pain in your scrotum, or the size of your scrotum suddenly changes.  You cannot push the hernia back in place by very gently pressing on it when you are lying down.  You have nausea or vomiting that does not go away.  You have a fast heartbeat.  You cannot have a bowel movement or pass gas. These symptoms may represent a serious problem that is an emergency. Do not wait to see if the symptoms will go away. Get medical help right away. Call your local emergency services (911 in the [place] Summary  An inguinal hernia develops when fat or the intestines push through a weak spot in a muscle where your leg meets your lower abdomen (groin).  This condition is caused by having a weak spot in muscles or tissues in your groin.  Symptoms may depend on the size of the hernia, and they may include pain or swelling in your groin. A small inguinal hernia often has no symptoms.  Treatment may not be needed if you do not have symptoms. If you have symptoms or a large hernia, you may need surgery to repair the hernia.  Avoid lifting heavy objects. Also, avoid standing for long periods of time. This information is not intended to replace advice given to you by your health care provider. Make sure you discuss any questions you have with your health care provider. Document Revised: 02/02/2020 Document Reviewed: 02/02/2020 Elsevier Patient Education  2021 [employer]  

## 2020-10-04 NOTE — Progress Notes (Signed)
Established Patient Office Visit  Subjective:  Patient ID: [name] Sosa, [name] Sosa    DOB: Jan 18, 1956  Age: 65 y.o. MRN: [id number]  CC:  Chief Complaint  Patient presents with  . Follow-up    HPI [name] Sosa presents for several issues and follows  He states around January of this year he noticed a painful bulge right inguinal area.  He had right inguinal hernia repair January 2021.  His job does require some lifting.  His current hernia is just medial to the scar from previous hernia repair which was done through [facility].  He is requesting to be seen at [facility] secondary to insurance issues.  Second issue is he has pain left great toe along the medial border.  Ingrowing toenail.  No granulation tissue.  No drainage.  Has not tried any warm salt water soaks.  Tolerating fairly well regarding pain  Patient has rash posterior scrotal area.  Has had similar rash previously.  This is dry and scaly and has responded well in the past to Lotrisone and requesting refill.  He has history of CAD, systolic heart failure, hypertension, dyslipidemia.  Current medications are rosuvastatin, Ranexa, lisinopril, carvedilol, and aspirin.  No recent chest pains.  No significant dyspnea.  Requesting follow-up labs.    Past Medical History:  Diagnosis Date  . CAD (coronary artery disease)    s/p 7 vessel CABG 2000 at [facility] // Echo 11/2018: EF 45-50, antero-apical and inferior akinesis // Myoview 11/2018: EF 51, distal ant/apical infarct and small inferior infarct with very mild peri-infarct ischemia; Low Risk  . Carotid artery occlusion   . High cholesterol   . Hyperlipidemia   . Hypertension   . Pre-diabetes    < 6.1 2017  . Stroke (HCC) 04/2016   TIA- 04/2016 double vision 2nd - speech - no residual  . 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 4v cabg    . CARDIAC CATHETERIZATION    . CARDIAC CATHETERIZATION N/A 03/23/2016    Procedure: Left Heart Cath and Cors/Grafts Angiography;  Surgeon: Kathleene Hazel, MD;  Location: [facility] INVASIVE CV LAB;  Service: Cardiovascular;  Laterality: N/A;  . CAROTID ANGIOGRAPHY N/A 10/11/2016   Procedure: Carotid Angiography / Cerebral angiogram;  Surgeon: Fransisco Hertz, MD;  Location: [facility] INVASIVE CV LAB;  Service: Cardiovascular;  Laterality: N/A;  . CAROTID ENDARTERECTOMY    . COLONOSCOPY  2017  . COLONOSCOPY W/ POLYPECTOMY    . CORONARY ARTERY BYPASS GRAFT    . ENDARTERECTOMY Left 10/16/2016   Procedure: LEFT CAROTID ENDARTERECTOMY WITH PATCH ANGIOPLASTY;  Surgeon: Fransisco Hertz, MD;  Location: [facility] OR;  Service: Vascular;  Laterality: Left;  . patent grafts    . s/p 7    . UPPER GASTROINTESTINAL ENDOSCOPY      Family History  Problem Relation Age of Onset  . Heart failure Mother   . Hypertension Mother   . Heart disease Mother   . Heart failure Father   . Hypertension Father   . Heart disease Father        before age 79  . Hypertension Sister   . Hypertension Brother   . Heart attack Neg Hx   . Stroke Neg Hx     Social History   Socioeconomic History  . Marital status: Single    Spouse name: Not on file  . Number of children: 3  . Years of education: AS + 2  .  Highest education level: Not on file  Occupational History  . Occupation: [employer]  Tobacco Use  . Smoking status: Former Smoker    Years: 2.00    Quit date: 06/19/1998    Years since quitting: 22.3  . Smokeless tobacco: Never Used  Vaping Use  . Vaping Use: Never used  Substance and Sexual Activity  . Alcohol use: No  . Drug use: No  . Sexual activity: Yes  Other Topics Concern  . Not on file  Social History Narrative   Lives at home alone   Right-handed   Caffeine: 2 cups per day   Social Determinants of Health   Financial Resource Strain: Not on file  Food Insecurity: Not on file  Transportation Needs: Not on file  Physical Activity: Not on file  Stress: Not on file  Social  Connections: Not on file  Intimate Partner Violence: Not on file    Outpatient Medications Prior to Visit  Medication Sig Dispense Refill  . aspirin 81 MG chewable tablet Chew 81 mg by mouth daily.    . carvedilol (COREG) 3.125 MG tablet TAKE 1 TABLET BY MOUTH TWICE DAILY WITH MEALS 180 tablet 1  . lisinopril (ZESTRIL) 10 MG tablet Take 1 tablet (10 mg total) by mouth daily. 90 tablet 2  . nitroGLYCERIN (NITROSTAT) 0.4 MG SL tablet Place 1 tablet (0.4 mg total) under the tongue every 5 (five) minutes as needed for chest pain (up to 3 doses). 25 tablet 3  . ranolazine (RANEXA) 500 MG 12 hr tablet Take 1 tablet by mouth twice daily 180 tablet 1  . rosuvastatin (CRESTOR) 20 MG tablet Take 1 tablet by mouth once daily 90 tablet 1   No facility-administered medications prior to visit.    Allergies  Allergen Reactions  . Amlodipine     Chest pain   . Lisinopril Cough    ROS Review of Systems  Constitutional: Negative for fatigue.  Eyes: Negative for visual disturbance.  Respiratory: Negative for cough, chest tightness and shortness of breath.   Cardiovascular: Negative for chest pain, palpitations and leg swelling.  Skin: Positive for rash.  Neurological: Negative for dizziness, syncope, weakness, light-headedness and headaches.      Objective:    Physical Exam Vitals reviewed.  Constitutional:      Appearance: Normal appearance.  Cardiovascular:     Rate and Rhythm: Normal rate and regular rhythm.  Pulmonary:     Effort: Pulmonary effort is normal.     Breath sounds: No rales.  Genitourinary:    Comments: He has scar right inguinal region from prior surgery.  He does have right inguinal bulge which is soft and easily reducible.  Bulge increases with coughing and pressure Bulge is noted just medial to the existing scar from prior surgery Skin:    Comments: Patient has mild dryness and flaking with some rash noted along the posterior aspect of the scrotal area.  No inguinal  rash.  Left great toe reveals ingrowing medial border of toenail.  No significant erythema and no granulation tissue.  No purulent drainage.  Skin is tender to palpation along the border of the nail  Neurological:     Mental Status: He is alert.     BP 118/60 (BP Location: Left Arm, Patient Position: Sitting, Cuff Size: Normal)   Pulse 70   Temp 97.8 F (36.6 C) (Oral)   Wt 131 lb 14.4 oz (59.8 kg)   SpO2 97%   BMI 20.66 kg/m  Wt Readings from  Last 3 Encounters:  10/04/20 131 lb 14.4 oz (59.8 kg)  02/01/20 124 lb (56.2 kg)  01/04/20 128 lb 8 oz (58.3 kg)     Health Maintenance Due  Topic Date Due  . TETANUS/TDAP  06/18/2018  . COVID-19 Vaccine (3 - Booster for Pfizer series) 04/14/2020    There are no preventive care reminders to display for this patient.  Lab Results  Component Value Date   TSH 0.57 10/24/2016   Lab Results  Component Value Date   WBC 5.9 11/28/2018   HGB 14.5 11/28/2018   HCT 42.6 11/28/2018   MCV 92 11/28/2018   PLT 263 11/28/2018   Lab Results  Component Value Date   NA 140 01/04/2020   K 4.5 01/04/2020   CO2 32 01/04/2020   GLUCOSE 96 01/04/2020   BUN 14 01/04/2020   CREATININE 0.68 (L) 01/04/2020   BILITOT 0.4 01/04/2020   ALKPHOS 54 01/29/2019   AST 22 01/04/2020   ALT 21 01/04/2020   PROT 6.8 01/04/2020   ALBUMIN 4.5 01/29/2019   CALCIUM 10.0 01/04/2020   ANIONGAP 7 10/17/2016   GFR 108.45 03/16/2015   Lab Results  Component Value Date   CHOL 142 01/04/2020   Lab Results  Component Value Date   HDL 53 01/04/2020   Lab Results  Component Value Date   LDLCALC 67 01/04/2020   Lab Results  Component Value Date   TRIG 134 01/04/2020   Lab Results  Component Value Date   CHOLHDL 2.7 01/04/2020   Lab Results  Component Value Date   HGBA1C 5.7 (H) 01/04/2020      Assessment & Plan:   #1 recurrent right inguinal hernia.  Prior surgery over a year ago.  Patient does have some symptoms in terms of pain.  No  evidence for strangulation -Set up referral to be evaluated by general surgery  #2 ingrowing left great toenail.  No granulation tissue or any signs of secondary infection -He will try some warm water soaks -We explained he may need partial nail excision if this continues to be a problem.  We offered digital block with partial nail excision but he would like to wait and try some salt water soaks first  #3 history of recurrent rash scrotal region. -Refilled Lotrisone cream which is worked well for him in the past  #4 history of CAD.  Patient on statin.  Goal LDL less than 70. -Recheck lipid and hepatic panel  #5 hypertension stable and well-controlled -Continue current blood pressure medications -Check basic metabolic panel  Meds ordered this encounter  Medications  . clotrimazole-betamethasone (LOTRISONE) cream    Sig: Apply 1 application topically daily.    Dispense:  30 g    Refill:  1    Follow-up: No follow-ups on file.    Evelena Peat, MD

## 2020-10-05 LAB — BASIC METABOLIC PANEL
BUN: 12 mg/dL (ref 6–23)
CO2: 32 mEq/L (ref 19–32)
Calcium: 9.5 mg/dL (ref 8.4–10.5)
Chloride: 102 mEq/L (ref 96–112)
Creatinine, Ser: 0.89 mg/dL (ref 0.40–1.50)
GFR: 90.81 mL/min (ref >60.00)
Glucose, Bld: 95 mg/dL (ref 70–99)
Potassium: 4.4 mEq/L (ref 3.5–5.1)
Sodium: 139 mEq/L (ref 135–145)

## 2020-10-05 LAB — HEPATIC FUNCTION PANEL
ALT: 19 U/L (ref 0–53)
AST: 21 U/L (ref 0–37)
Albumin: 4.1 g/dL (ref 3.5–5.2)
Alkaline Phosphatase: 42 U/L (ref 39–117)
Bilirubin, Direct: 0.1 mg/dL (ref 0.0–0.3)
Total Bilirubin: 0.5 mg/dL (ref 0.2–1.2)
Total Protein: 6.8 g/dL (ref 6.0–8.3)

## 2020-10-05 LAB — LIPID PANEL
Cholesterol: 125 mg/dL (ref 0–200)
HDL: 49.7 mg/dL (ref >39.00)
LDL Cholesterol: 42 mg/dL (ref 0–99)
NonHDL: 75.77
Total CHOL/HDL Ratio: 3
Triglycerides: 170 mg/dL — ABNORMAL HIGH (ref 0.0–149.0)
VLDL: 34 mg/dL (ref 0.0–40.0)

## 2020-10-18 ENCOUNTER — Telehealth: Payer: Self-pay | Admitting: Family Medicine

## 2020-10-18 DIAGNOSIS — Z1211 Encounter for screening for malignant neoplasm of colon: Secondary | ICD-10-CM

## 2020-10-18 NOTE — Telephone Encounter (Signed)
Patient is wanting a referral for a colonoscopy no a referral for GI issues.   He doesn't want to be referred to [facility]

## 2020-10-18 NOTE — Telephone Encounter (Signed)
We need to clarify where he wants to be referred if he is not requesting [facility]

## 2020-10-19 NOTE — Addendum Note (Signed)
Addended by: Kern Reap B on: 10/19/2020 12:04 PM   Modules accepted: Orders

## 2020-10-19 NOTE — Telephone Encounter (Signed)
Patient "does not care as long as it's not [facility]".  Referral placed.

## 2020-10-19 NOTE — Telephone Encounter (Signed)
Left message on machine for patient to return our call 

## 2020-11-01 ENCOUNTER — Ambulatory Visit: Payer: 59 | Admitting: Internal Medicine

## 2020-11-04 ENCOUNTER — Telehealth: Payer: Self-pay | Admitting: *Deleted

## 2020-11-04 NOTE — Telephone Encounter (Signed)
Pt is agreeable to pre op appt needed. Pt states his procedure is in July 2022. I have scheduled the pt to see Gillian Shields, NP 12/13/20 @ 1:45. Pt is grateful for the call and the help. I will forward clearance notes to NP for upcoming appt. Will send FYI to surgeon pt has appt 12/13/20 for pre op assessment.

## 2020-11-04 NOTE — Telephone Encounter (Signed)
Primary Cardiologist:Christopher [name], MD  Chart reviewed as part of pre-operative protocol coverage. Because of Kenechukwu Sivley's past medical history and time since last visit, he/she will require a follow-up visit in order to better assess preoperative cardiovascular risk.  Pre-op covering staff: - Please schedule appointment and call patient to inform them. - Please contact requesting surgeon's office via preferred method (i.e, phone, fax) to inform them of need for appointment prior to surgery.  If applicable, this message will also be routed to pharmacy pool and/or primary cardiologist for input on holding anticoagulant/antiplatelet agent as requested below so that this information is available at time of patient's appointment.   Ronney Asters, NP  11/04/2020, 3:25 PM

## 2020-11-04 NOTE — Telephone Encounter (Signed)
   [facility] Pre-operative Risk Assessment    Patient Name: [name] Sosa  DOB: [birth year]  MRN: [id number]   HEARTCARE STAFF: - Please ensure there is not already an duplicate clearance open for this procedure. - Under Visit Info/Reason for Call, type in Other and utilize the format Clearance MM/DD/YY or Clearance TBD. Do not use dashes or single digits. - If request is for dental extraction, please clarify the # of teeth to be extracted.  Request for surgical clearance:  1. What type of surgery is being performed? LAPAROSCOPIC RIGHT INGUINAL HERNIA REPAIR   2. When is this surgery scheduled? TBD   3. What type of clearance is required (medical clearance vs. Pharmacy clearance to hold med vs. Both)? MEDICAL  4. Are there any medications that need to be held prior to surgery and how long? ASA    5. Practice name and name of physician performing surgery? [facility]; Levora Angel, NP   6. What is the office phone number? [phone]   7.   What is the office fax number? [phone]  8.   Anesthesia type (None, local, MAC, general) ? NOT LISTED   [name] Sosa 11/04/2020, 3:13 PM  _________________________________________________________________   (provider comments below)

## 2020-11-19 ENCOUNTER — Other Ambulatory Visit: Payer: Self-pay | Admitting: Cardiovascular Disease

## 2020-11-21 ENCOUNTER — Other Ambulatory Visit: Payer: Self-pay

## 2020-11-21 MED ORDER — CARVEDILOL 3.125 MG PO TABS: 3.1250 mg | ORAL_TABLET | Freq: Two times a day (BID) | ORAL | 1 refills | Status: DC

## 2020-12-06 ENCOUNTER — Encounter: Payer: Self-pay | Admitting: Cardiovascular Disease

## 2020-12-06 NOTE — Telephone Encounter (Signed)
error 

## 2020-12-06 NOTE — Telephone Encounter (Signed)
Pt has appt 6/28 with Gillian Shields, NP for pre op clearance. Will forward clearance notes to NP for upcoming appt. Will send FYI to surgeon's office pt has appt 6/28.

## 2020-12-06 NOTE — Telephone Encounter (Signed)
Patient requesting a stress test. He states he already has an appointment for preop clearance 6/28 and wants to know if he can have a stress test that day instead to clear him.

## 2020-12-06 NOTE — Telephone Encounter (Signed)
Pre-op covering staff, can you please notify patient that he will need to keep pre-op visit with [facility] and it can be discussed at that visit about whether or not he needs a stress test or any other testing prior to surgery. I will remove from pre-op pool.  Thank you!

## 2020-12-12 ENCOUNTER — Telehealth: Payer: Self-pay

## 2020-12-12 NOTE — Telephone Encounter (Signed)
Patient is follow up. He states he would really prefer to see Dr. [name] and refuses to see an APP. Continuously stressed he needs to be worked in.

## 2020-12-12 NOTE — Telephone Encounter (Signed)
Call placed to Pt.  Advised he can keep appt with [facility] tomorrow or first available with Dr. [name] is July 28.  Pt states he wants to see Dr. [name].  He also requests being placed on wait list.

## 2020-12-13 ENCOUNTER — Ambulatory Visit: Payer: 59 | Admitting: Family

## 2020-12-27 ENCOUNTER — Other Ambulatory Visit: Payer: Self-pay

## 2020-12-27 DIAGNOSIS — I25709 Atherosclerosis of coronary artery bypass graft(s), unspecified, with unspecified angina pectoris: Secondary | ICD-10-CM

## 2020-12-27 MED ORDER — NITROGLYCERIN 0.4 MG SL SUBL: 0.4000 mg | SUBLINGUAL_TABLET | SUBLINGUAL | 1 refills | Status: DC | PRN

## 2021-01-10 ENCOUNTER — Other Ambulatory Visit: Payer: Self-pay | Admitting: Cardiovascular Disease

## 2021-01-12 ENCOUNTER — Other Ambulatory Visit: Payer: Self-pay

## 2021-01-12 ENCOUNTER — Encounter: Payer: Self-pay | Admitting: Cardiovascular Disease

## 2021-01-12 ENCOUNTER — Ambulatory Visit (INDEPENDENT_AMBULATORY_CARE_PROVIDER_SITE_OTHER): Payer: 59 | Admitting: Cardiovascular Disease

## 2021-01-12 VITALS — BP 126/72 | HR 54 | Ht 67.0 in | Wt 129.0 lb

## 2021-01-12 DIAGNOSIS — I10 Essential (primary) hypertension: Secondary | ICD-10-CM | POA: Diagnosis not present

## 2021-01-12 DIAGNOSIS — E7800 Pure hypercholesterolemia, unspecified: Secondary | ICD-10-CM | POA: Diagnosis not present

## 2021-01-12 DIAGNOSIS — I25110 Atherosclerotic heart disease of native coronary artery with unstable angina pectoris: Secondary | ICD-10-CM | POA: Diagnosis not present

## 2021-01-12 LAB — CBC WITH DIFFERENTIAL/PLATELET
Basophils Absolute: 0 10*3/uL (ref 0.0–0.2)
Basos: 0 %
EOS (ABSOLUTE): 0.2 10*3/uL (ref 0.0–0.4)
Eos: 3 %
Hematocrit: 42.2 % (ref 37.5–51.0)
Hemoglobin: 14.3 g/dL (ref 13.0–17.7)
Immature Grans (Abs): 0 10*3/uL (ref 0.0–0.1)
Immature Granulocytes: 1 %
Lymphocytes Absolute: 1.8 10*3/uL (ref 0.7–3.1)
Lymphs: 28 %
MCH: 31.9 pg (ref 26.6–33.0)
MCHC: 33.9 g/dL (ref 31.5–35.7)
MCV: 94 fL (ref 79–97)
Monocytes Absolute: 0.7 10*3/uL (ref 0.1–0.9)
Monocytes: 10 %
Neutrophils Absolute: 3.8 10*3/uL (ref 1.4–7.0)
Neutrophils: 58 %
Platelets: 266 10*3/uL (ref 150–450)
RBC: 4.48 x10E6/uL (ref 4.14–5.80)
RDW: 12.3 % (ref 11.6–15.4)
WBC: 6.6 10*3/uL (ref 3.4–10.8)

## 2021-01-12 LAB — BASIC METABOLIC PANEL
BUN/Creatinine Ratio: 15 (ref 10–24)
BUN: 14 mg/dL (ref 8–27)
CO2: 24 mmol/L (ref 20–29)
Calcium: 9.7 mg/dL (ref 8.6–10.2)
Chloride: 102 mmol/L (ref 96–106)
Creatinine, Ser: 0.93 mg/dL (ref 0.76–1.27)
Glucose: 104 mg/dL — ABNORMAL HIGH (ref 65–99)
Potassium: 4.5 mmol/L (ref 3.5–5.2)
Sodium: 140 mmol/L (ref 134–144)
eGFR: 92 mL/min/{1.73_m2} (ref >59)

## 2021-01-12 NOTE — Patient Instructions (Addendum)
Medication Instructions:  Your physician recommends that you continue on your current medications as directed. Please refer to the Current Medication list given to you today.  *If you need a refill on your cardiac medications before your next appointment, please call your pharmacy*  Lab Work: Your physician recommends that you have lab work today.BMET and CBC  If you have labs (blood work) drawn today and your tests are completely normal, you will receive your results only by: MyChart Message (if you have MyChart) OR A paper copy in the mail If you have any lab test that is abnormal or we need to change your treatment, we will call you to review the results.   Testing/Procedures: Your physician has requested that you have a cardiac catheterization. Cardiac catheterization is used to diagnose and/or treat various heart conditions. Doctors may recommend this procedure for a number of different reasons. The most common reason is to evaluate chest pain. Chest pain can be a symptom of coronary artery disease (CAD), and cardiac catheterization can show whether plaque is narrowing or blocking your heart's arteries. This procedure is also used to evaluate the valves, as well as measure the blood flow and oxygen levels in different parts of your heart. For further information please visit [email or web address] Please follow instruction sheet, as given.   Follow-Up: At [facility], you and your health needs are our priority.  As part of our continuing mission to provide you with exceptional heart care, we have created designated Provider Care Teams.  These Care Teams include your primary Cardiologist (physician) and Advanced Practice Providers (APPs -  Physician Assistants and Nurse Practitioners) who all work together to provide you with the care you need, when you need it.  We recommend signing up for the patient portal called "MyChart".  Sign up information is provided on this After Visit Summary.   MyChart is used to connect with patients for Virtual Visits (Telemedicine).  Patients are able to view lab/test results, encounter notes, upcoming appointments, etc.  Non-urgent messages can be sent to your provider as well.   To learn more about what you can do with MyChart, go to [email or web address].    Your next appointment:   3 week(s)  The format for your next appointment:   In Person  Provider:   You may see Verne Carrow, MD or one of the following Advanced Practice Providers on your designated Care Team:   Ronie Spies, PA-C Jacolyn Reedy, PA-C   Other Instructions  [facility] CARDIOVASCULAR DIVISION [facility] [address] OFFICE [address] [place] [address] Dept: [phone] Loc: [phone]  Alieu Finnigan  01/12/2021  You are scheduled for a Cardiac Catheterization on Thursday, August 4 with Dr. Verne Carrow.  1. Please arrive at the [facility] (Main Entrance A) at [facility]: [address] [place], [place] [address] at 7:00 AM (This time is two hours before your procedure to ensure your preparation). Free valet parking service is available.   Special note: Every effort is made to have your procedure done on time. Please understand that emergencies sometimes delay scheduled procedures.  2. Diet: Do not eat solid foods after midnight.  The patient may have clear liquids until 5am upon the day of the procedure.  3. Labs: You will need to have blood drawn on Thursday, July 28 at [facility] at [facility]. [address], [place]  Open: 7:30am - 5pm    Phone: [phone].  You do not need to be fasting.  4. Medication instructions in preparation for your procedure:   Contrast Allergy: No  Stop taking, Lisinopril (Zestril or Prinivil) Thursday, August 4,  On the morning of your procedure, take your Aspirin and any morning medicines NOT listed above.  You may use sips of  water.  5. Plan for one night stay--bring personal belongings. 6. Bring a current list of your medications and current insurance cards. 7. You MUST have a responsible person to drive you home. 8. Someone MUST be with you the first 24 hours after you arrive home or your discharge will be delayed. 9. Please wear clothes that are easy to get on and off and wear slip-on shoes.  Thank you for allowing [place] to care for you!   -- [facility]

## 2021-01-12 NOTE — Progress Notes (Signed)
  Chief Complaint  Patient presents with   Follow-up    CAD    History of Present Illness: 65 yo male with h/o CAD s/p 7V CABG in 2000 at [facility], HTN, hyperlipidemia, carotid artery disease and prior TIA here today for cardiac follow up. He was seen as a new pt in January 2011 with complaints of chest pain. He had been followed from 2000 until 2011 by Dr. Callwood in [place]. He has had frequent episodes of chest pain since I met him in 2011. Cardiac cath in 2011 with 5/7 patent bypass grafts. Stress myoview in August 2015 with no ischemia, LVEF=48%. Last cardiac cath October 2017 with patent LIMA to LAD, patent SVG to OM which filled all of the Circumflex and occluded RCA with occluded SVG to RCA. The RCA filled from left to right collaterals. He underwent left carotid endarterectomy in May 2018 following a TIA. His carotid disease is followed in [facility]. He was seen by virtual visit June 2020 by Scott Weaver, PA-C with c/o dyspnea. Norvasc added. Nuclear stress test June 2020 with evidence of prior infarct but overall low risk. Echo June 2020 with LVEF=45-50% with segmental wall motion abnormality. No valve disease.   He is here today for follow up. The patient denies any dyspnea, palpitations, lower extremity edema, orthopnea, PND, dizziness, near syncope or syncope. He has had accelerated chest pain and feels that is is similar to prior angina.   Primary Care Physician: Burchette, Bruce W, MD  Past Medical History:  Diagnosis Date   CAD (coronary artery disease)    s/p 7 vessel CABG 2000 at [facility] // Echo 11/2018: EF 45-50, antero-apical and inferior akinesis // Myoview 11/2018: EF 51, distal ant/apical infarct and small inferior infarct with very mild peri-infarct ischemia; Low Risk   Carotid artery occlusion    High cholesterol    Hyperlipidemia    Hypertension    Pre-diabetes    < 6.1 2017   Stroke (HCC) 04/2016   TIA- 04/2016 double vision 2nd - speech - no residual   Trochlear  neuropathy, 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    Past Surgical History:  Procedure Laterality Date   4v cabg     CARDIAC CATHETERIZATION     CARDIAC CATHETERIZATION N/A 03/23/2016   Procedure: Left Heart Cath and Cors/Grafts Angiography;  Surgeon: Tanzie Rothschild D Hibo Blasdell, MD;  [facility];  Service: Cardiovascular;  Laterality: N/A;   CAROTID ANGIOGRAPHY N/A 10/11/2016   Procedure: Carotid Angiography / Cerebral angiogram;  Surgeon: Brian L Chen, MD;  Location: [facility];  Service: Cardiovascular;  Laterality: N/A;   CAROTID ENDARTERECTOMY     COLONOSCOPY  2017   COLONOSCOPY W/ POLYPECTOMY     CORONARY ARTERY BYPASS GRAFT     ENDARTERECTOMY Left 10/16/2016   Procedure: LEFT CAROTID ENDARTERECTOMY WITH PATCH ANGIOPLASTY;  Surgeon: Brian L Chen, MD;  Location: [facility];  Service: Vascular;  Laterality: Left;   patent grafts     s/p 7     UPPER GASTROINTESTINAL ENDOSCOPY      Current Outpatient Medications  Medication Sig Dispense Refill   aspirin 81 MG chewable tablet Chew 81 mg by mouth daily.     carvedilol (COREG) 3.125 MG tablet Take 1 tablet (3.125 mg total) by mouth 2 (two) times daily with a meal. 180 tablet 1   clotrimazole-betamethasone (LOTRISONE) cream Apply 1 application topically daily. 30 g 1   lisinopril (ZESTRIL) 10 MG tablet Take 1 tablet by mouth once daily 90   tablet 0   nitroGLYCERIN (NITROSTAT) 0.4 MG SL tablet Place 1 tablet (0.4 mg total) under the tongue every 5 (five) minutes as needed for chest pain (up to 3 doses). 25 tablet 1   ranolazine (RANEXA) 500 MG 12 hr tablet Take 1 tablet by mouth twice daily 180 tablet 1   rosuvastatin (CRESTOR) 20 MG tablet Take 1 tablet by mouth once daily 90 tablet 1   No current facility-administered medications for this visit.    Allergies  Allergen Reactions   Amlodipine     Chest pain    Lisinopril Cough    Social History   Socioeconomic History   Marital status: Single    Spouse name: Not on file   Number of  children: 3   Years of education: AS + 2   Highest education level: Not on file  Occupational History   Occupation: [employer]  Tobacco Use   Smoking status: Former    Years: 2.00    Types: Cigarettes    Quit date: 06/19/1998    Years since quitting: 22.5   Smokeless tobacco: Never  Vaping Use   Vaping Use: Never used  Substance and Sexual Activity   Alcohol use: No   Drug use: No   Sexual activity: Yes  Other Topics Concern   Not on file  Social History Narrative   Lives at home alone   Right-handed   Caffeine: 2 cups per day   Social Determinants of Health   Financial Resource Strain: Not on file  Food Insecurity: Not on file  Transportation Needs: Not on file  Physical Activity: Not on file  Stress: Not on file  Social Connections: Not on file  Intimate Partner Violence: Not on file    Family History  Problem Relation Age of Onset   Heart failure Mother    Hypertension Mother    Heart disease Mother    Heart failure Father    Hypertension Father    Heart disease Father        before age 60   Hypertension Sister    Hypertension Brother    Heart attack Neg Hx    Stroke Neg Hx     Review of Systems:  As stated in the HPI and otherwise negative.   BP 126/72   Pulse (!) 54   Ht 5' 7" (1.702 m)   Wt 129 lb (58.5 kg)   SpO2 99%   BMI 20.20 kg/m   Physical Examination:  General: Well developed, well nourished, NAD  HEENT: OP clear, mucus membranes moist  SKIN: warm, dry. No rashes. Neuro: No focal deficits  Musculoskeletal: Muscle strength 5/5 all ext  Psychiatric: Mood and affect normal  Neck: No JVD, no carotid bruits, no thyromegaly, no lymphadenopathy.  Lungs:Clear bilaterally, no wheezes, rhonci, crackles Cardiovascular: Regular rate and rhythm. No murmurs, gallops or rubs. Abdomen:Soft. Bowel sounds present. Non-tender.  Extremities: No lower extremity edema. Pulses are 2 + in the bilateral DP/PT.  Cardiac cath 03/23/16: 1. Triple vessel CAD s/p  5V CABG with 2/5 patent bypass grafts 2. LAD is occluded mid segment. LIMA graft is patent to the LAD 3. Circumflex is occluded proximally. The free RIMA graft to the first OM is patent and fills the entire Circumflex including all marginal branches.  4. The RCA is occluded proximally. The vein graft to the RCA is now occluded (new since last cath). The distal RCA fills from left to right collaterals.  5. Both SVG grafts to Diagonal 1   and Diagonal 2 are known to be occluded 6. Moderate systolic LV dysfunction, LVEF=35-45%.   Echo June 2020: 1. The left ventricle has mildly reduced systolic function, with an ejection fraction of 45-50%. The cavity size was normal. Left ventricular diastolic parameters were normal. Indeterminate filling pressures No evidence of left ventricular regional wall  motion abnormalities.  2. Severe akinesis of the left ventricular, mid-apical anterior wall and inferior wall.  3. The right ventricle has normal systolic function. The cavity was normal. There is no increase in right ventricular wall thickness.  4. The mitral valve is abnormal. Mild thickening of the mitral valve leaflet.  5. The tricuspid valve is grossly normal.  6. The aortic valve is tricuspid. No stenosis of the aortic valve.  EKG:  EKG is  ordered today. The EKG demonstrates Sinus brady, old inferior Q waves  Recent Labs: 10/04/2020: ALT 19; BUN 12; Creatinine, Ser 0.89; Potassium 4.4; Sodium 139   Lipid Panel    Component Value Date/Time   CHOL 125 10/04/2020 1658   CHOL 137 01/29/2019 0818   TRIG 170.0 (H) 10/04/2020 1658   HDL 49.70 10/04/2020 1658   HDL 50 01/29/2019 0818   CHOLHDL 3 10/04/2020 1658   VLDL 34.0 10/04/2020 1658   LDLCALC 42 10/04/2020 1658   LDLCALC 67 01/04/2020 1553     Wt Readings from Last 3 Encounters:  01/12/21 129 lb (58.5 kg)  10/04/20 131 lb 14.4 oz (59.8 kg)  02/01/20 124 lb (56.2 kg)     Other studies Reviewed: Additional studies/ records that were  reviewed today include: . Review of the above records demonstrates:    Assessment and Plan:   1. CAD s/p CABG with unstable angina: Chronic chest pain but now with more frequent daily pain. Will arrange cardiac cath at [facility] 01/19/21. Low normal LV systolic function by echo June 2020 with known wall motion abnormality. Last cardiac cath in October 2017 with stable CAD. He did not tolerate Norvasc. Will continue ASA, statin, beta blocker and Ranexa.   2. HTN: BP is controlled. Continue current therapy  3. HLD: LDL at goal. Continue statin  4. Carotid artery disease: s/p left CEA in 2018. Followed in Vascular surgery office.    Current medicines are reviewed at length with the patient today.  The patient does not have concerns regarding medicines.  The following changes have been made:  no change  Labs/ tests ordered today include:   Orders Placed This Encounter  Procedures   Basic metabolic panel   CBC with Differential/Platelet   EKG 12-Lead     Disposition:   F/U with me after the cath.   Signed, Eura Mccauslin, MD 01/12/2021 10:26 AM    [facility] [address], [place], [place]  [address] Phone: [phone]; Fax: [phone]  

## 2021-01-12 NOTE — H&P (View-Only) (Signed)
  Chief Complaint  Patient presents with   Follow-up    CAD    History of Present Illness: 65 yo male with h/o CAD s/p 7V CABG in 2000 at [facility], HTN, hyperlipidemia, carotid artery disease and prior TIA here today for cardiac follow up. He was seen as a new pt in January 2011 with complaints of chest pain. He had been followed from 2000 until 2011 by Dr. Callwood in [place]. He has had frequent episodes of chest pain since I met him in 2011. Cardiac cath in 2011 with 5/7 patent bypass grafts. Stress myoview in August 2015 with no ischemia, LVEF=48%. Last cardiac cath October 2017 with patent LIMA to LAD, patent SVG to OM which filled all of the Circumflex and occluded RCA with occluded SVG to RCA. The RCA filled from left to right collaterals. He underwent left carotid endarterectomy in May 2018 following a TIA. His carotid disease is followed in [facility]. He was seen by virtual visit June 2020 by Scott Weaver, PA-C with c/o dyspnea. Norvasc added. Nuclear stress test June 2020 with evidence of prior infarct but overall low risk. Echo June 2020 with LVEF=45-50% with segmental wall motion abnormality. No valve disease.   He is here today for follow up. The patient denies any dyspnea, palpitations, lower extremity edema, orthopnea, PND, dizziness, near syncope or syncope. He has had accelerated chest pain and feels that is is similar to prior angina.   Primary Care Physician: Burchette, Bruce W, MD  Past Medical History:  Diagnosis Date   CAD (coronary artery disease)    s/p 7 vessel CABG 2000 at [facility] // Echo 11/2018: EF 45-50, antero-apical and inferior akinesis // Myoview 11/2018: EF 51, distal ant/apical infarct and small inferior infarct with very mild peri-infarct ischemia; Low Risk   Carotid artery occlusion    High cholesterol    Hyperlipidemia    Hypertension    Pre-diabetes    < 6.1 2017   Stroke (HCC) 04/2016   TIA- 04/2016 double vision 2nd - speech - no residual   Trochlear  neuropathy, 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    Past Surgical History:  Procedure Laterality Date   4v cabg     CARDIAC CATHETERIZATION     CARDIAC CATHETERIZATION N/A 03/23/2016   Procedure: Left Heart Cath and Cors/Grafts Angiography;  Surgeon: Christopher D McAlhany, MD;  [facility];  Service: Cardiovascular;  Laterality: N/A;   CAROTID ANGIOGRAPHY N/A 10/11/2016   Procedure: Carotid Angiography / Cerebral angiogram;  Surgeon: Brian L Chen, MD;  [facility];  Service: Cardiovascular;  Laterality: N/A;   CAROTID ENDARTERECTOMY     COLONOSCOPY  2017   COLONOSCOPY W/ POLYPECTOMY     CORONARY ARTERY BYPASS GRAFT     ENDARTERECTOMY Left 10/16/2016   Procedure: LEFT CAROTID ENDARTERECTOMY WITH PATCH ANGIOPLASTY;  Surgeon: Brian L Chen, MD;  Location: [facility];  Service: Vascular;  Laterality: Left;   patent grafts     s/p 7     UPPER GASTROINTESTINAL ENDOSCOPY      Current Outpatient Medications  Medication Sig Dispense Refill   aspirin 81 MG chewable tablet Chew 81 mg by mouth daily.     carvedilol (COREG) 3.125 MG tablet Take 1 tablet (3.125 mg total) by mouth 2 (two) times daily with a meal. 180 tablet 1   clotrimazole-betamethasone (LOTRISONE) cream Apply 1 application topically daily. 30 g 1   lisinopril (ZESTRIL) 10 MG tablet Take 1 tablet by mouth once daily 90   tablet 0   nitroGLYCERIN (NITROSTAT) 0.4 MG SL tablet Place 1 tablet (0.4 mg total) under the tongue every 5 (five) minutes as needed for chest pain (up to 3 doses). 25 tablet 1   ranolazine (RANEXA) 500 MG 12 hr tablet Take 1 tablet by mouth twice daily 180 tablet 1   rosuvastatin (CRESTOR) 20 MG tablet Take 1 tablet by mouth once daily 90 tablet 1   No current facility-administered medications for this visit.    Allergies  Allergen Reactions   Amlodipine     Chest pain    Lisinopril Cough    Social History   Socioeconomic History   Marital status: Single    Spouse name: Not on file   Number of  children: 3   Years of education: AS + 2   Highest education level: Not on file  Occupational History   Occupation: [employer]  Tobacco Use   Smoking status: Former    Years: 2.00    Types: Cigarettes    Quit date: 06/19/1998    Years since quitting: 22.5   Smokeless tobacco: Never  Vaping Use   Vaping Use: Never used  Substance and Sexual Activity   Alcohol use: No   Drug use: No   Sexual activity: Yes  Other Topics Concern   Not on file  Social History Narrative   Lives at home alone   Right-handed   Caffeine: 2 cups per day   Social Determinants of Health   Financial Resource Strain: Not on file  Food Insecurity: Not on file  Transportation Needs: Not on file  Physical Activity: Not on file  Stress: Not on file  Social Connections: Not on file  Intimate Partner Violence: Not on file    Family History  Problem Relation Age of Onset   Heart failure Mother    Hypertension Mother    Heart disease Mother    Heart failure Father    Hypertension Father    Heart disease Father        before age 57   Hypertension Sister    Hypertension Brother    Heart attack Neg Hx    Stroke Neg Hx     Review of Systems:  As stated in the HPI and otherwise negative.   BP 126/72   Pulse (!) 54   Ht 5' 7" (1.702 m)   Wt 129 lb (58.5 kg)   SpO2 99%   BMI 20.20 kg/m   Physical Examination:  General: Well developed, well nourished, NAD  HEENT: OP clear, mucus membranes moist  SKIN: warm, dry. No rashes. Neuro: No focal deficits  Musculoskeletal: Muscle strength 5/5 all ext  Psychiatric: Mood and affect normal  Neck: No JVD, no carotid bruits, no thyromegaly, no lymphadenopathy.  Lungs:Clear bilaterally, no wheezes, rhonci, crackles Cardiovascular: Regular rate and rhythm. No murmurs, gallops or rubs. Abdomen:Soft. Bowel sounds present. Non-tender.  Extremities: No lower extremity edema. Pulses are 2 + in the bilateral DP/PT.  Cardiac cath 03/23/16: 1. Triple vessel CAD s/p  5V CABG with 2/5 patent bypass grafts 2. LAD is occluded mid segment. LIMA graft is patent to the LAD 3. Circumflex is occluded proximally. The free RIMA graft to the first OM is patent and fills the entire Circumflex including all marginal branches.  4. The RCA is occluded proximally. The vein graft to the RCA is now occluded (new since last cath). The distal RCA fills from left to right collaterals.  5. Both SVG grafts to Diagonal 1  and Diagonal 2 are known to be occluded 6. Moderate systolic LV dysfunction, LVEF=35-45%.   Echo June 2020: 1. The left ventricle has mildly reduced systolic function, with an ejection fraction of 45-50%. The cavity size was normal. Left ventricular diastolic parameters were normal. Indeterminate filling pressures No evidence of left ventricular regional wall  motion abnormalities.  2. Severe akinesis of the left ventricular, mid-apical anterior wall and inferior wall.  3. The right ventricle has normal systolic function. The cavity was normal. There is no increase in right ventricular wall thickness.  4. The mitral valve is abnormal. Mild thickening of the mitral valve leaflet.  5. The tricuspid valve is grossly normal.  6. The aortic valve is tricuspid. No stenosis of the aortic valve.  EKG:  EKG is  ordered today. The EKG demonstrates Sinus brady, old inferior Q waves  Recent Labs: 10/04/2020: ALT 19; BUN 12; Creatinine, Ser 0.89; Potassium 4.4; Sodium 139   Lipid Panel    Component Value Date/Time   CHOL 125 10/04/2020 1658   CHOL 137 01/29/2019 0818   TRIG 170.0 (H) 10/04/2020 1658   HDL 49.70 10/04/2020 1658   HDL 50 01/29/2019 0818   CHOLHDL 3 10/04/2020 1658   VLDL 34.0 10/04/2020 1658   LDLCALC 42 10/04/2020 1658   LDLCALC 67 01/04/2020 1553     Wt Readings from Last 3 Encounters:  01/12/21 129 lb (58.5 kg)  10/04/20 131 lb 14.4 oz (59.8 kg)  02/01/20 124 lb (56.2 kg)     Other studies Reviewed: Additional studies/ records that were  reviewed today include: . Review of the above records demonstrates:    Assessment and Plan:   1. CAD s/p CABG with unstable angina: Chronic chest pain but now with more frequent daily pain. Will arrange cardiac cath at [facility] 01/19/21. Low normal LV systolic function by echo June 2020 with known wall motion abnormality. Last cardiac cath in October 2017 with stable CAD. He did not tolerate Norvasc. Will continue ASA, statin, beta blocker and Ranexa.   2. HTN: BP is controlled. Continue current therapy  3. HLD: LDL at goal. Continue statin  4. Carotid artery disease: s/p left CEA in 2018. Followed in Vascular surgery office.    Current medicines are reviewed at length with the patient today.  The patient does not have concerns regarding medicines.  The following changes have been made:  no change  Labs/ tests ordered today include:   Orders Placed This Encounter  Procedures   Basic metabolic panel   CBC with Differential/Platelet   EKG 12-Lead     Disposition:   F/U with me after the cath.   Signed, Christopher McAlhany, MD 01/12/2021 10:26 AM    [facility] [address], [place], [place]  [address] Phone: [phone]; Fax: [phone]  

## 2021-01-18 ENCOUNTER — Telehealth: Payer: Self-pay | Admitting: *Deleted

## 2021-01-18 NOTE — Telephone Encounter (Signed)
Cardiac catheterization scheduled at [facility] for: Thursday January 19, 2021 9 AM Arrive [facility] Main Entrance [facility]) at: 7 AM   No solid food after midnight prior to cath, clear liquids until 5 AM day of procedure.    AM meds can be  taken pre-cath with sips of water including:   aspirin 81 mg   Confirmed patient has responsible adult to drive home post procedure and be with patient first 24 hours after arriving home.  Patients are allowed one visitor in the waiting room during the time they are at the hospital for their procedure. Both patient and visitor must wear a mask once they enter the hospital.   Patient reports does not currently have any symptoms concerning for COVID-19 and no household members with COVID-19 like illness.      Reviewed procedure/mask/visitor instructions with patient.

## 2021-01-19 ENCOUNTER — Ambulatory Visit (HOSPITAL_COMMUNITY)
Admission: RE | Admit: 2021-01-19 | Discharge: 2021-01-19 | Disposition: A | Discharge status: Home / Self Care | Payer: 59 | Attending: Cardiovascular Disease | Admitting: Cardiovascular Disease

## 2021-01-19 ENCOUNTER — Ambulatory Visit (HOSPITAL_COMMUNITY)
Admission: RE | Disposition: A | Discharge status: Home / Self Care | Payer: Self-pay | Source: Home / Self Care | Attending: Cardiovascular Disease

## 2021-01-19 ENCOUNTER — Other Ambulatory Visit: Payer: Self-pay

## 2021-01-19 ENCOUNTER — Encounter (HOSPITAL_COMMUNITY): Payer: Self-pay | Admitting: Cardiovascular Disease

## 2021-01-19 DIAGNOSIS — Z87891 Personal history of nicotine dependence: Secondary | ICD-10-CM | POA: Diagnosis not present

## 2021-01-19 DIAGNOSIS — I779 Disorder of arteries and arterioles, unspecified: Secondary | ICD-10-CM | POA: Diagnosis not present

## 2021-01-19 DIAGNOSIS — Z8249 Family history of ischemic heart disease and other diseases of the circulatory system: Secondary | ICD-10-CM | POA: Insufficient documentation

## 2021-01-19 DIAGNOSIS — I2582 Chronic total occlusion of coronary artery: Secondary | ICD-10-CM | POA: Insufficient documentation

## 2021-01-19 DIAGNOSIS — Z7982 Long term (current) use of aspirin: Secondary | ICD-10-CM | POA: Diagnosis not present

## 2021-01-19 DIAGNOSIS — Z79899 Other long term (current) drug therapy: Secondary | ICD-10-CM | POA: Insufficient documentation

## 2021-01-19 DIAGNOSIS — Z888 Allergy status to other drugs, medicaments and biological substances status: Secondary | ICD-10-CM | POA: Diagnosis not present

## 2021-01-19 DIAGNOSIS — Z8673 Personal history of transient ischemic attack (TIA), and cerebral infarction without residual deficits: Secondary | ICD-10-CM | POA: Diagnosis not present

## 2021-01-19 DIAGNOSIS — I200 Unstable angina: Secondary | ICD-10-CM | POA: Diagnosis present

## 2021-01-19 DIAGNOSIS — I25110 Atherosclerotic heart disease of native coronary artery with unstable angina pectoris: Secondary | ICD-10-CM | POA: Diagnosis not present

## 2021-01-19 DIAGNOSIS — Z951 Presence of aortocoronary bypass graft: Secondary | ICD-10-CM | POA: Diagnosis not present

## 2021-01-19 DIAGNOSIS — E7800 Pure hypercholesterolemia, unspecified: Secondary | ICD-10-CM | POA: Insufficient documentation

## 2021-01-19 DIAGNOSIS — I10 Essential (primary) hypertension: Secondary | ICD-10-CM | POA: Diagnosis not present

## 2021-01-19 HISTORY — PX: LEFT HEART CATH AND CORS/GRAFTS ANGIOGRAPHY: CATH118250

## 2021-01-19 SURGERY — LEFT HEART CATH AND CORS/GRAFTS ANGIOGRAPHY
Anesthesia: LOCAL

## 2021-01-19 MED ORDER — ASPIRIN 81 MG PO CHEW: 81.0000 mg | CHEWABLE_TABLET | ORAL | Status: DC

## 2021-01-19 MED ORDER — MIDAZOLAM HCL 2 MG/2ML IJ SOLN
INTRAMUSCULAR | Status: AC
  Filled 2021-01-19: qty 2

## 2021-01-19 MED ORDER — HEPARIN (PORCINE) IN NACL 1000-0.9 UT/500ML-% IV SOLN
INTRAVENOUS | Status: DC | PRN
  Administered 2021-01-19: 500 mL

## 2021-01-19 MED ORDER — SODIUM CHLORIDE 0.9 % IV SOLN: 250.0000 mL | INTRAVENOUS | Status: DC | PRN

## 2021-01-19 MED ORDER — IOHEXOL 350 MG/ML SOLN
INTRAVENOUS | Status: DC | PRN
  Administered 2021-01-19: 60 mL

## 2021-01-19 MED ORDER — SODIUM CHLORIDE 0.9 % IV SOLN: INTRAVENOUS | Status: AC

## 2021-01-19 MED ORDER — HEPARIN (PORCINE) IN NACL 1000-0.9 UT/500ML-% IV SOLN
INTRAVENOUS | Status: AC
  Filled 2021-01-19: qty 2000

## 2021-01-19 MED ORDER — LIDOCAINE HCL (PF) 1 % IJ SOLN
INTRAMUSCULAR | Status: AC
  Filled 2021-01-19: qty 60

## 2021-01-19 MED ORDER — FENTANYL CITRATE (PF) 100 MCG/2ML IJ SOLN
INTRAMUSCULAR | Status: AC
  Filled 2021-01-19: qty 2

## 2021-01-19 MED ORDER — ACETAMINOPHEN 325 MG PO TABS: 650.0000 mg | ORAL_TABLET | ORAL | Status: DC | PRN

## 2021-01-19 MED ORDER — ONDANSETRON HCL 4 MG/2ML IJ SOLN: 4.0000 mg | Freq: Four times a day (QID) | INTRAMUSCULAR | Status: DC | PRN

## 2021-01-19 MED ORDER — MIDAZOLAM HCL 2 MG/2ML IJ SOLN
INTRAMUSCULAR | Status: DC | PRN
  Administered 2021-01-19: 2 mg via INTRAVENOUS

## 2021-01-19 MED ORDER — LABETALOL HCL 5 MG/ML IV SOLN: 10.0000 mg | INTRAVENOUS | Status: DC | PRN

## 2021-01-19 MED ORDER — SODIUM CHLORIDE 0.9% FLUSH: 3.0000 mL | INTRAVENOUS | Status: DC | PRN

## 2021-01-19 MED ORDER — FENTANYL CITRATE (PF) 100 MCG/2ML IJ SOLN
INTRAMUSCULAR | Status: DC | PRN
  Administered 2021-01-19: 50 ug via INTRAVENOUS

## 2021-01-19 MED ORDER — HEPARIN (PORCINE) IN NACL 1000-0.9 UT/500ML-% IV SOLN
INTRAVENOUS | Status: DC | PRN
Start: 2021-01-19 — End: 2021-01-19
  Administered 2021-01-19: 500 mL

## 2021-01-19 MED ORDER — LIDOCAINE HCL (PF) 1 % IJ SOLN
INTRAMUSCULAR | Status: DC | PRN
  Administered 2021-01-19: 12 mL

## 2021-01-19 MED ORDER — SODIUM CHLORIDE 0.9 % WEIGHT BASED INFUSION
3.0000 mL/kg/h | INTRAVENOUS | Status: AC
  Administered 2021-01-19: 3 mL/kg/h via INTRAVENOUS

## 2021-01-19 MED ORDER — SODIUM CHLORIDE 0.9 % WEIGHT BASED INFUSION: 1.0000 mL/kg/h | INTRAVENOUS | Status: DC

## 2021-01-19 MED ORDER — HYDRALAZINE HCL 20 MG/ML IJ SOLN: 10.0000 mg | INTRAMUSCULAR | Status: DC | PRN

## 2021-01-19 MED ORDER — SODIUM CHLORIDE 0.9% FLUSH: 3.0000 mL | Freq: Two times a day (BID) | INTRAVENOUS | Status: DC

## 2021-01-19 SURGICAL SUPPLY — 10 items
CATH INFINITI 5 FR IM (CATHETERS) ×2 IMPLANT
CATH INFINITI 5FR MULTPACK ANG (CATHETERS) ×2 IMPLANT
CLOSURE MYNX CONTROL 5F (Vascular Products) ×2 IMPLANT
KIT HEART LEFT (KITS) ×2 IMPLANT
PACK CARDIAC CATHETERIZATION (CUSTOM PROCEDURE TRAY) ×2 IMPLANT
SHEATH PINNACLE 5F 10CM (SHEATH) ×2 IMPLANT
SHEATH PROBE COVER 6X72 (BAG) ×2 IMPLANT
TRANSDUCER W/STOPCOCK (MISCELLANEOUS) ×2 IMPLANT
TUBING CIL FLEX 10 FLL-RA (TUBING) ×2 IMPLANT
WIRE EMERALD 3MM-J .035X150CM (WIRE) ×2 IMPLANT

## 2021-01-19 NOTE — Interval H&P Note (Signed)
History and Physical Interval Note:  01/19/2021 9:16 AM  [name] Sosa  has presented today for surgery, with the diagnosis of unstable angina.  The various methods of treatment have been discussed with the patient and family. After consideration of risks, benefits and other options for treatment, the patient has consented to  Procedure(s): LEFT HEART CATH AND CORS/GRAFTS ANGIOGRAPHY (N/A) as a surgical intervention.  The patient's history has been reviewed, patient examined, no change in status, stable for surgery.  I have reviewed the patient's chart and labs.  Questions were answered to the patient's satisfaction.    Cath Lab Visit (complete for each Cath Lab visit)  Clinical Evaluation Leading to the Procedure:   ACS: No.  Non-ACS:    Anginal Classification: CCS III  Anti-ischemic medical therapy: Maximal Therapy (2 or more classes of medications)  Non-Invasive Test Results: No non-invasive testing performed  Prior CABG: Previous CABG        Verne Carrow

## 2021-01-19 NOTE — Progress Notes (Signed)
Pt ambulated without difficulty or bleeding.   Discharged home with his fiance who will drive and stay with pt x 24 hrs.

## 2021-01-20 MED FILL — Heparin Sod (Porcine)-NaCl IV Soln 1000 Unit/500ML-0.9%: INTRAVENOUS | Qty: 500 | Status: AC

## 2021-01-24 ENCOUNTER — Telehealth: Payer: Self-pay | Admitting: Cardiovascular Disease

## 2021-01-24 NOTE — Telephone Encounter (Signed)
   [facility] Pre-operative Risk Assessment    Patient Name: [name] Sosa  DOB: January 29, 1956 MRN: [id number]  HEARTCARE STAFF:  - IMPORTANT!!!!!! Under Visit Info/Reason for Call, type in Other and utilize the format Clearance MM/DD/YY or Clearance TBD. Do not use dashes or single digits. - Please review there is not already an duplicate clearance open for this procedure. - If request is for dental extraction, please clarify the # of teeth to be extracted. - If the patient is currently at the dentist's office, call Pre-Op Callback Staff (MA/nurse) to input urgent request.  - If the patient is not currently in the dentist office, please route to the Pre-Op pool.  Request for surgical clearance:  What type of surgery is being performed? Hernia repair   When is this surgery scheduled? Not yet schedule   What type of clearance is required (medical clearance vs. Pharmacy clearance to hold med vs. Both)? Medical   Are there any medications that need to be held prior to surgery and how long? No at this time  Practice name and name of physician performing surgery? [facility]  What is the office phone number? [phone]   7.   What is the office fax number? [phone]  8.   Anesthesia type (None, local, MAC, general) ? General    Shon Millet 01/24/2021, 12:22 PM  _________________________________________________________________   (provider comments below)

## 2021-01-24 NOTE — Telephone Encounter (Signed)
    Patient Name: [name] Sosa  DOB: 11/09/1955 MRN: [id number]  Primary Cardiologist: Verne Carrow, MD  Chart reviewed as part of pre-operative protocol coverage. Given past medical history and time since last visit, based on ACC/AHA guidelines, Rufino Foerster would be at acceptable risk for the planned procedure without further cardiovascular testing.   Patient recently underwent cardiac catheterization on 01/19/2021, coronary anatomy was stable, no stent was placed.  Medical therapy was recommended.  Given the reassuring cardiac study, no additional work-up is needed.  I will route this recommendation to the requesting party via Epic fax function and remove from pre-op pool.  Please call with questions.  Lamar, [place] 01/24/2021, 1:36 PM

## 2021-02-13 ENCOUNTER — Ambulatory Visit (INDEPENDENT_AMBULATORY_CARE_PROVIDER_SITE_OTHER): Payer: 59 | Admitting: Cardiovascular Disease

## 2021-02-13 ENCOUNTER — Other Ambulatory Visit: Payer: Self-pay

## 2021-02-13 ENCOUNTER — Encounter: Payer: Self-pay | Admitting: Cardiovascular Disease

## 2021-02-13 VITALS — BP 144/72 | HR 62 | Ht 68.0 in | Wt 130.0 lb

## 2021-02-13 DIAGNOSIS — E7800 Pure hypercholesterolemia, unspecified: Secondary | ICD-10-CM

## 2021-02-13 DIAGNOSIS — I25709 Atherosclerosis of coronary artery bypass graft(s), unspecified, with unspecified angina pectoris: Secondary | ICD-10-CM | POA: Diagnosis not present

## 2021-02-13 DIAGNOSIS — I10 Essential (primary) hypertension: Secondary | ICD-10-CM | POA: Diagnosis not present

## 2021-02-13 DIAGNOSIS — I6523 Occlusion and stenosis of bilateral carotid arteries: Secondary | ICD-10-CM

## 2021-02-13 NOTE — Patient Instructions (Signed)
Medication Instructions:  No changes *If you need a refill on your cardiac medications before your next appointment, please call your pharmacy*   Lab Work: none If you have labs (blood work) drawn today and your tests are completely normal, you will receive your results only by: . MyChart Message (if you have MyChart) OR . A paper copy in the mail If you have any lab test that is abnormal or we need to change your treatment, we will call you to review the results.   Testing/Procedures: none   Follow-Up: At [facility], you and your health needs are our priority.  As part of our continuing mission to provide you with exceptional heart care, we have created designated Provider Care Teams.  These Care Teams include your primary Cardiologist (physician) and Advanced Practice Providers (APPs -  Physician Assistants and Nurse Practitioners) who all work together to provide you with the care you need, when you need it.  Your next appointment:   12 month(s)  The format for your next appointment:   In Person  Provider:   You may see Christopher McAlhany, MD or one of the following Advanced Practice Providers on your designated Care Team:    Dayna Dunn, PA-C  Michele Lenze, PA-C  

## 2021-02-13 NOTE — Progress Notes (Signed)
Chief Complaint  Patient presents with   Follow-up    CAD    History of Present Illness: 65 yo male with h/o CAD s/p 7V CABG in 2000 at [place], HTN, hyperlipidemia, carotid artery disease and prior TIA here today for cardiac follow up. He was seen as a new pt in January 2011 with complaints of chest pain. He had been followed from 2000 until 2011 by Dr. [name] in [place]. He has had frequent episodes of chest pain since I met him in 2011. Cardiac cath in 2011 with 5/7 patent bypass grafts. Stress myoview in August 2015 with no ischemia, LVEF=48%. Last cardiac cath October 2017 with patent LIMA to LAD, patent SVG to OM which filled all of the Circumflex and occluded RCA with occluded SVG to RCA. The RCA filled from left to right collaterals. He underwent left carotid endarterectomy in May 2018 following a TIA. His carotid disease is followed in [facility]. He was seen by virtual visit June 2020 by Richardson Dopp, PA-C with c/o dyspnea. Norvasc added. Nuclear stress test June 2020 with evidence of prior infarct but overall low risk. Echo June 2020 with LVEF=45-50% with segmental wall motion abnormality. No valve disease. He was seen in the office July 2022 and reported worsened chest pains. Cardiac cath 01/19/21 with 2/5 patent grafts (LIMA to LAD, free RIMA to Circumflex). The RCA is occluded and fills from left to right collaterals. LVEF=40-45%.   He is here today for follow up. The patient denies any dyspnea, palpitations, lower extremity edema, orthopnea, PND, dizziness, near syncope or syncope. He continues to have chest pain at rest and with exertion on the right side. No associated dyspnea.   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    Past Surgical History:  Procedure Laterality Date   4v cabg     CARDIAC CATHETERIZATION     CARDIAC CATHETERIZATION N/A 03/23/2016   Procedure: Left Heart Cath and Cors/Grafts Angiography;  Surgeon: Burnell Blanks, MD;  Location: [facility];  Service: Cardiovascular;  Laterality: N/A;   CAROTID ANGIOGRAPHY N/A 10/11/2016   Procedure: Carotid Angiography / Cerebral angiogram;  Surgeon: Conrad Ashburn, MD;  Location: [facility];  Service: Cardiovascular;  Laterality: N/A;   CAROTID ENDARTERECTOMY     COLONOSCOPY  2017   COLONOSCOPY W/ POLYPECTOMY     CORONARY ARTERY BYPASS GRAFT     ENDARTERECTOMY Left 10/16/2016   Procedure: LEFT CAROTID ENDARTERECTOMY WITH PATCH ANGIOPLASTY;  Surgeon: Conrad Bloomingdale, MD;  Location: [facility];  Service: Vascular;  Laterality: Left;   LEFT HEART CATH AND CORS/GRAFTS ANGIOGRAPHY N/A 01/19/2021   Procedure: LEFT HEART CATH AND CORS/GRAFTS ANGIOGRAPHY;  Surgeon: Burnell Blanks, MD;  Location: [facility];  Service: Cardiovascular;  Laterality: N/A;   patent grafts     s/p 7     UPPER GASTROINTESTINAL ENDOSCOPY  Current Outpatient Medications  Medication Sig Dispense Refill   aspirin 81 MG chewable tablet Chew 81 mg by mouth daily.     carvedilol (COREG) 3.125 MG tablet Take 1 tablet (3.125 mg total) by mouth 2 (two) times daily with a meal. 180 tablet 1   clotrimazole-betamethasone (LOTRISONE) cream Apply 1 application topically daily. 30 g 1   lisinopril (ZESTRIL) 10 MG tablet Take 1 tablet by mouth once daily 90 tablet 0   nitroGLYCERIN (NITROSTAT) 0.4 MG SL tablet Place 1 tablet (0.4 mg total) under the tongue every 5 (five) minutes as needed for chest  pain (up to 3 doses). 25 tablet 1   ranolazine (RANEXA) 500 MG 12 hr tablet Take 1 tablet by mouth twice daily 180 tablet 1   rosuvastatin (CRESTOR) 20 MG tablet Take 1 tablet by mouth once daily 90 tablet 1   No current facility-administered medications for this visit.    Allergies  Allergen Reactions   Amlodipine     Chest pain    Lisinopril Cough    Social History   Socioeconomic History   Marital status: Single    Spouse name: Not on file   Number of children: 3   Years of education: AS + 2   Highest education level: Not on file  Occupational History   Occupation: [employer]  Tobacco Use   Smoking status: Former    Years: 2.00    Types: Cigarettes    Quit date: 06/19/1998    Years since quitting: 22.6   Smokeless tobacco: Never  Vaping Use   Vaping Use: Never used  Substance and Sexual Activity   Alcohol use: No   Drug use: No   Sexual activity: Yes  Other Topics Concern   Not on file  Social History Narrative   Lives at home alone   Right-handed   Caffeine: 2 cups per day   Social Determinants of Health   Financial Resource Strain: Not on file  Food Insecurity: Not on file  Transportation Needs: Not on file  Physical Activity: Not on file  Stress: Not on file  Social Connections: Not on file  Intimate Partner Violence: Not on file    Family History  Problem Relation Age of Onset   Heart failure Mother    Hypertension Mother    Heart disease Mother    Heart failure Father    Hypertension Father    Heart disease Father        before age 21   Hypertension Sister    Hypertension Brother    Heart attack Neg Hx    Stroke Neg Hx     Review of Systems:  As stated in the HPI and otherwise negative.   BP (!) 144/72   Pulse 62   Ht _0  (1.727 m)   Wt 130 lb (59 kg)   SpO2 99%   BMI 19.77 kg/m   Physical Examination:  General: Well developed, well nourished, NAD  HEENT: OP clear, mucus membranes moist  SKIN: warm, dry. No rashes. Neuro: No  focal deficits  Musculoskeletal: Muscle strength 5/5 all ext  Psychiatric: Mood and affect normal  Neck: No JVD, no carotid bruits, no thyromegaly, no lymphadenopathy.  Lungs:Clear bilaterally, no wheezes, rhonci, crackles Cardiovascular: Regular rate and rhythm. No murmurs, gallops or rubs. Abdomen:Soft. Bowel sounds present. Non-tender.  Extremities: No lower extremity edema. Pulses are 2 + in the bilateral DP/PT.  Echo June 2020: 1. The left ventricle has mildly reduced systolic function, with  an ejection fraction of 45-50%. The cavity size was normal. Left ventricular diastolic parameters were normal. Indeterminate filling pressures No evidence of left ventricular regional wall  motion abnormalities.  2. Severe akinesis of the left ventricular, mid-apical anterior wall and inferior wall.  3. The right ventricle has normal systolic function. The cavity was normal. There is no increase in right ventricular wall thickness.  4. The mitral valve is abnormal. Mild thickening of the mitral valve leaflet.  5. The tricuspid valve is grossly normal.  6. The aortic valve is tricuspid. No stenosis of the aortic valve.  EKG:  EKG is not  ordered today. The EKG demonstrates   Recent Labs: 10/04/2020: ALT 19 01/12/2021: BUN 14; Creatinine, Ser 0.93; Hemoglobin 14.3; Platelets 266; Potassium 4.5; Sodium 140   Lipid Panel    Component Value Date/Time   CHOL 125 10/04/2020 1658   CHOL 137 01/29/2019 0818   TRIG 170.0 (H) 10/04/2020 1658   HDL 49.70 10/04/2020 1658   HDL 50 01/29/2019 0818   CHOLHDL 3 10/04/2020 1658   VLDL 34.0 10/04/2020 1658   LDLCALC 42 10/04/2020 1658   LDLCALC 67 01/04/2020 1553     Wt Readings from Last 3 Encounters:  02/13/21 130 lb (59 kg)  01/19/21 125 lb (56.7 kg)  01/12/21 129 lb (58.5 kg)     Other studies Reviewed: Additional studies/ records that were reviewed today include: . Review of the above records demonstrates:    Assessment and Plan:   1. CAD  s/p CABG with unstable angina: He continues to have atypical chest pains. Cardiac cath 01/19/21 with 2/5 patent grafts, collateral filling of the occluded RCA. Will continue medical management of CAD. Continue ASA, statin, beta blocker and Ranexa. He did not tolerate Norvasc.   2. HTN: BP is controlled. No changes  3. HLD: LDL 42 in April 2022. Continue statin.   4. Carotid artery disease: s/p left CEA in 2018. Followed in Vascular surgery office.    Current medicines are reviewed at length with the patient today.  The patient does not have concerns regarding medicines.  The following changes have been made:  no change  Labs/ tests ordered today include:   No orders of the defined types were placed in this encounter.    Disposition:   F/U with me in one year.   Signed, [name], MD 02/13/2021 2:40 PM    [facility] [address], [place], [place]  [address] Phone: [phone]; Fax: [phone]

## 2021-02-26 ENCOUNTER — Other Ambulatory Visit: Payer: Self-pay | Admitting: Cardiovascular Disease

## 2021-02-26 DIAGNOSIS — E7800 Pure hypercholesterolemia, unspecified: Secondary | ICD-10-CM

## 2021-03-03 ENCOUNTER — Other Ambulatory Visit: Payer: Self-pay | Admitting: Neurology

## 2021-03-03 MED ORDER — RANOLAZINE ER 500 MG PO TB12
500.0000 mg | ORAL_TABLET | Freq: Two times a day (BID) | ORAL | 0 refills | Status: DC
Start: 2021-03-03 — End: 2021-05-30

## 2021-04-04 ENCOUNTER — Other Ambulatory Visit: Payer: Self-pay | Admitting: Cardiovascular Disease

## 2021-04-10 ENCOUNTER — Telehealth: Payer: Self-pay | Admitting: Cardiovascular Disease

## 2021-04-10 NOTE — Telephone Encounter (Signed)
*  STAT* If patient is at the pharmacy, call can be transferred to refill team.   1. Which medications need to be refilled? (please list name of each medication and dose if known)  lisinopril (ZESTRIL) 10 MG tablet  2. Which pharmacy/location (including street and city if local pharmacy) is medication to be sent to? [facility] [address], [place] - [address]  3. Do they need a 30 day or 90 day supply? 90 with refills  Patient said his pharmacy did not get the new rx. He would like the office to re-send it

## 2021-04-10 NOTE — Addendum Note (Signed)
Addended by: Kerrie Buffalo on: 04/10/2021 04:54 PM   Modules accepted: Orders

## 2021-05-10 ENCOUNTER — Telehealth: Payer: Self-pay | Admitting: Cardiovascular Disease

## 2021-05-10 MED ORDER — CARVEDILOL 3.125 MG PO TABS: 3.1250 mg | ORAL_TABLET | Freq: Two times a day (BID) | ORAL | 2 refills | Status: DC

## 2021-05-10 NOTE — Telephone Encounter (Signed)
*  STAT* If patient is at the pharmacy, call can be transferred to refill team.   1. Which medications need to be refilled? (please list name of each medication and dose if known) carvedilol (COREG) 3.125 MG tablet  2. Which pharmacy/location (including street and city if local pharmacy) is medication to be sent to? [id number] - [place],  - [address]  3. Do they need a 30 day or 90 day supply?  90 day supply

## 2021-05-10 NOTE — Telephone Encounter (Signed)
Pt's medication was sent to pt's pharmacy as requested. Confirmation received.  °

## 2021-05-16 ENCOUNTER — Ambulatory Visit: Payer: 59 | Admitting: Family Medicine

## 2021-05-19 ENCOUNTER — Ambulatory Visit: Payer: 59 | Admitting: Family Medicine

## 2021-05-24 ENCOUNTER — Ambulatory Visit (INDEPENDENT_AMBULATORY_CARE_PROVIDER_SITE_OTHER): Payer: 59

## 2021-05-24 ENCOUNTER — Other Ambulatory Visit: Payer: Self-pay

## 2021-05-24 ENCOUNTER — Encounter: Payer: Self-pay | Admitting: Family Medicine

## 2021-05-24 ENCOUNTER — Ambulatory Visit (INDEPENDENT_AMBULATORY_CARE_PROVIDER_SITE_OTHER): Payer: 59 | Admitting: Family Medicine

## 2021-05-24 VITALS — BP 120/64 | HR 60 | Temp 97.7°F | Ht 68.0 in | Wt 132.4 lb

## 2021-05-24 DIAGNOSIS — R300 Dysuria: Secondary | ICD-10-CM | POA: Diagnosis not present

## 2021-05-24 DIAGNOSIS — M25561 Pain in right knee: Secondary | ICD-10-CM | POA: Diagnosis not present

## 2021-05-24 LAB — POC URINALSYSI DIPSTICK (AUTOMATED)
Bilirubin, UA: NEGATIVE
Blood, UA: NEGATIVE
Glucose, UA: NEGATIVE
Ketones, UA: NEGATIVE
Leukocytes, UA: NEGATIVE
Nitrite, UA: NEGATIVE
Protein, UA: NEGATIVE
Spec Grav, UA: 1.02 (ref 1.010–1.025)
Urobilinogen, UA: 0.2 E.U./dL
pH, UA: 6.5 (ref 5.0–8.0)

## 2021-05-24 NOTE — Progress Notes (Signed)
Established Patient Office Visit  Subjective:  Patient ID: [name] Sosa, male    DOB: [birth year]  Age: 65 y.o. MRN: [id number]  CC:  Chief Complaint  Patient presents with   Urinary Tract Infection   Dysuria    Patient complains of buring during urination, x3 weeks    HPI [name] Sosa presents for the following issues  Recent burning with urination.  He states he had right inguinal hernia repair on 11/8.  He did apparently have a Foley catheter in place for couple hours for the surgery.  He noticed some burning after this was removed and initially this was felt to be probably just related to the Foley catheter.  About a week ago he had urinalysis done through his surgical office and this showed no evidence for infection.  He has not noted any gross hematuria.  No fevers or chills.  No history of kidney stones.  Urine flow is okay.  Burning symptoms are relatively mild.  No history of UTI.  He is monogamous.  No risk factors for STD.  No history of STD. We reviewed recent lab work from outside health system  Separate issue of right knee pain for about 3 months.  Location is medial involving right medial condyle of the femur.  He denies any falls or injury.  He has pain especially with negotiating stairs.  He has not seen any visible swelling.  No effusion.  No warmth.  No erythema.  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    Past Surgical History:  Procedure Laterality Date   4v cabg     CARDIAC  CATHETERIZATION     CARDIAC CATHETERIZATION N/A 03/23/2016   Procedure: Left Heart Cath and Cors/Grafts Angiography;  Surgeon: Burnell Blanks, MD;  Location: [facility];  Service: Cardiovascular;  Laterality: N/A;   CAROTID ANGIOGRAPHY N/A 10/11/2016   Procedure: Carotid Angiography / Cerebral angiogram;  Surgeon: Conrad Staunton, MD;  Location: [facility];  Service: Cardiovascular;  Laterality: N/A;   CAROTID ENDARTERECTOMY     COLONOSCOPY  2017   COLONOSCOPY W/ POLYPECTOMY     CORONARY ARTERY BYPASS GRAFT     ENDARTERECTOMY Left 10/16/2016   Procedure: LEFT CAROTID ENDARTERECTOMY WITH PATCH ANGIOPLASTY;  Surgeon: Conrad Graniteville, MD;  Location: [facility];  Service: Vascular;  Laterality: Left;   LEFT HEART CATH AND CORS/GRAFTS ANGIOGRAPHY N/A 01/19/2021   Procedure: LEFT HEART CATH AND CORS/GRAFTS ANGIOGRAPHY;  Surgeon: Burnell Blanks, MD;  Location: [facility];  Service: Cardiovascular;  Laterality: N/A;   patent grafts     s/p 7     UPPER GASTROINTESTINAL ENDOSCOPY      Family History  Problem Relation Age of Onset   Heart failure Mother    Hypertension Mother    Heart disease Mother    Heart failure Father    Hypertension Father    Heart disease Father  before age 79   Hypertension Sister    Hypertension Brother    Heart attack Neg Hx    Stroke Neg Hx     Social History   Socioeconomic History   Marital status: Single    Spouse name: Not on file   Number of children: 3   Years of education: AS + 2   Highest education level: Not on file  Occupational History   Occupation: [employer]  Tobacco Use   Smoking status: Former    Years: 2.00    Types: Cigarettes    Quit date: 06/19/1998    Years since quitting: 22.9   Smokeless tobacco: Never  Vaping Use   Vaping Use: Never used  Substance and Sexual Activity   Alcohol use: No   Drug use: No   Sexual activity: Yes  Other Topics Concern   Not on file  Social History Narrative   Lives at  home alone   Right-handed   Caffeine: 2 cups per day   Social Determinants of Health   Financial Resource Strain: Not on file  Food Insecurity: Not on file  Transportation Needs: Not on file  Physical Activity: Not on file  Stress: Not on file  Social Connections: Not on file  Intimate Partner Violence: Not on file    Outpatient Medications Prior to Visit  Medication Sig Dispense Refill   aspirin 81 MG chewable tablet Chew 81 mg by mouth daily.     carvedilol (COREG) 3.125 MG tablet Take 1 tablet (3.125 mg total) by mouth 2 (two) times daily with a meal. 180 tablet 2   clotrimazole-betamethasone (LOTRISONE) cream Apply 1 application topically daily. 30 g 1   lisinopril (ZESTRIL) 10 MG tablet Take 1 tablet by mouth once daily 90 tablet 0   nitroGLYCERIN (NITROSTAT) 0.4 MG SL tablet Place 1 tablet (0.4 mg total) under the tongue every 5 (five) minutes as needed for chest pain (up to 3 doses). 25 tablet 1   ranolazine (RANEXA) 500 MG 12 hr tablet Take 1 tablet (500 mg total) by mouth 2 (two) times daily. 180 tablet 0   rosuvastatin (CRESTOR) 20 MG tablet Take 1 tablet by mouth once daily 90 tablet 3   No facility-administered medications prior to visit.    Allergies  Allergen Reactions   Amlodipine     Chest pain    Lisinopril Cough    ROS Review of Systems  Constitutional:  Negative for appetite change, chills, fever and unexpected weight change.  Genitourinary:  Positive for dysuria. Negative for decreased urine volume, difficulty urinating, hematuria and penile discharge.  Hematological:  Negative for adenopathy.     Objective:    Physical Exam Vitals reviewed.  Constitutional:      Appearance: Normal appearance.  Cardiovascular:     Rate and Rhythm: Normal rate and regular rhythm.  Musculoskeletal:     Right lower leg: No edema.     Left lower leg: No edema.     Comments: Right knee no effusion.  No warmth.  No ecchymosis.  Full range of motion.  He does have  some localized tenderness over the right medial condyle of femur.  No medial or lateral joint line tenderness.  Ligament testing is normal.  Neurological:     Mental Status: He is alert.    BP 120/64 (BP Location: Left Arm, Patient Position: Sitting, Cuff Size: Normal)   Pulse 60   Temp 97.7 F (36.5 C) (Oral)   Ht 5' 8"  (1.727 m)  Wt 132 lb 6.4 oz (60.1 kg)   SpO2 98%   BMI 20.13 kg/m  Wt Readings from Last 3 Encounters:  05/24/21 132 lb 6.4 oz (60.1 kg)  02/13/21 130 lb (59 kg)  01/19/21 125 lb (56.7 kg)     Health Maintenance Due  Topic Date Due   Pneumococcal Vaccine 27-45 Years old (1 - PCV) Never done   HIV Screening  Never done   Hepatitis C Screening  Never done   Zoster Vaccines- Shingrix (1 of 2) Never done   TETANUS/TDAP  06/18/2018   COVID-19 Vaccine (3 - Booster for Pfizer series) 12/09/2019    There are no preventive care reminders to display for this patient.  Lab Results  Component Value Date   TSH 0.57 10/24/2016   Lab Results  Component Value Date   WBC 6.6 01/12/2021   HGB 14.3 01/12/2021   HCT 42.2 01/12/2021   MCV 94 01/12/2021   PLT 266 01/12/2021   Lab Results  Component Value Date   NA 140 01/12/2021   K 4.5 01/12/2021   CO2 24 01/12/2021   GLUCOSE 104 (H) 01/12/2021   BUN 14 01/12/2021   CREATININE 0.93 01/12/2021   BILITOT 0.5 10/04/2020   ALKPHOS 42 10/04/2020   AST 21 10/04/2020   ALT 19 10/04/2020   PROT 6.8 10/04/2020   ALBUMIN 4.1 10/04/2020   CALCIUM 9.7 01/12/2021   ANIONGAP 7 10/17/2016   EGFR 92 01/12/2021   GFR 90.81 10/04/2020   Lab Results  Component Value Date   CHOL 125 10/04/2020   Lab Results  Component Value Date   HDL 49.70 10/04/2020   Lab Results  Component Value Date   LDLCALC 42 10/04/2020   Lab Results  Component Value Date   TRIG 170.0 (H) 10/04/2020   Lab Results  Component Value Date   CHOLHDL 3 10/04/2020   Lab Results  Component Value Date   HGBA1C 5.7 (H) 01/04/2020       Assessment & Plan:   #1 dysuria following surgical procedure and Foley catheter a month ago.  Recent urinalysis unremarkable.  Urine dipstick today again reveals no evidence for infection.  He is not having any obstruction to urine flow.  We recommended trial of over-the-counter Pyridium 2-3 times daily.  If not clearing with that over the next couple weeks consider urology referral  #2 right knee pain.  This is actually above the joint line involving the right medial femoral condyle.  No reported injury.  Given duration of symptoms go ahead with plain films to start with.   No orders of the defined types were placed in this encounter.   Follow-up: No follow-ups on file.    Carolann Littler, MD

## 2021-05-24 NOTE — Patient Instructions (Signed)
Consider trial of over the counter Pyridium 100 mg- one to two every 8 to 12 hours to see if this helps with the discomfort.

## 2021-05-30 ENCOUNTER — Other Ambulatory Visit: Payer: Self-pay | Admitting: Cardiovascular Disease

## 2021-06-14 ENCOUNTER — Encounter: Payer: Self-pay | Admitting: Family Medicine

## 2021-06-14 DIAGNOSIS — M25569 Pain in unspecified knee: Secondary | ICD-10-CM

## 2021-06-29 ENCOUNTER — Encounter: Payer: Self-pay | Admitting: Family Medicine

## 2021-07-16 ENCOUNTER — Other Ambulatory Visit: Payer: Self-pay | Admitting: Cardiovascular Disease

## 2021-09-06 ENCOUNTER — Telehealth: Payer: Self-pay | Admitting: Physician Assistant

## 2021-09-06 NOTE — Telephone Encounter (Addendum)
? ?  The patient called the answering service after-hours today. Chart reviewed. ?He states home BP usually runs 128/82. For the last 3 hours it has been elevated in the 180-190 range (up to 190/109), has been rechecking frequently. However, most recent recheck was 158/68 and coming down. HR 60s-70s. No CP, SOB, palpitations, dizziness, visual symptoms or stroke symptoms. Generally feeling good. BP cuff is old per his report. He reports adherence to meds, no pain, no recent other med changes or decongestants. I advise he recheck 1 more time this evening in about an hour and if continuing to downtrend OK to call office in AM to be seen (either PCP or cardiology office). If it begins uptrending persistently above 180-190, I advised he call back for further guidance or seek care in ER for any symptoms. The patient verbalized understanding and gratitude. ? ?Laurann Montana, PA-C ? ?

## 2021-09-11 ENCOUNTER — Encounter: Payer: Self-pay | Admitting: Family Medicine

## 2021-09-11 ENCOUNTER — Ambulatory Visit: Payer: BC Managed Care – PPO | Admitting: Family Medicine

## 2021-09-11 ENCOUNTER — Encounter (HOSPITAL_BASED_OUTPATIENT_CLINIC_OR_DEPARTMENT_OTHER): Payer: Self-pay | Admitting: Family

## 2021-09-11 ENCOUNTER — Other Ambulatory Visit: Payer: Self-pay

## 2021-09-11 ENCOUNTER — Ambulatory Visit (INDEPENDENT_AMBULATORY_CARE_PROVIDER_SITE_OTHER): Payer: BC Managed Care – PPO | Admitting: Family

## 2021-09-11 VITALS — BP 142/74 | HR 65 | Ht 67.0 in | Wt 136.0 lb

## 2021-09-11 VITALS — BP 126/62 | HR 67 | Temp 97.4°F | Ht 67.0 in | Wt 136.7 lb

## 2021-09-11 DIAGNOSIS — I255 Ischemic cardiomyopathy: Secondary | ICD-10-CM

## 2021-09-11 DIAGNOSIS — I10 Essential (primary) hypertension: Secondary | ICD-10-CM | POA: Diagnosis not present

## 2021-09-11 DIAGNOSIS — E785 Hyperlipidemia, unspecified: Secondary | ICD-10-CM

## 2021-09-11 DIAGNOSIS — R42 Dizziness and giddiness: Secondary | ICD-10-CM

## 2021-09-11 DIAGNOSIS — I25118 Atherosclerotic heart disease of native coronary artery with other forms of angina pectoris: Secondary | ICD-10-CM | POA: Diagnosis not present

## 2021-09-11 DIAGNOSIS — R519 Headache, unspecified: Secondary | ICD-10-CM

## 2021-09-11 DIAGNOSIS — R062 Wheezing: Secondary | ICD-10-CM

## 2021-09-11 DIAGNOSIS — I6523 Occlusion and stenosis of bilateral carotid arteries: Secondary | ICD-10-CM

## 2021-09-11 MED ORDER — ASPIRIN EC 81 MG PO TBEC: 81.0000 mg | DELAYED_RELEASE_TABLET | Freq: Every day | ORAL | 3 refills | Status: DC

## 2021-09-11 NOTE — Progress Notes (Signed)
? ?Office Visit  ?  ?Patient Name: [name] Sosa ?Date of Encounter: 09/11/2021 ? ?PCP:  Kristian CoveyBurchette, Bruce W, MD ?  ?[facility]  ?Cardiologist:  Verne Carrowhristopher McAlhany, MD  ?Advanced Practice Provider:  No care team member to display ?Electrophysiologist:  None  ?   ?Chief Complaint  ?  ?[name] Sosa is a 66 y.o. male with a hx of CAD s/p CABGx7 at [facility] in 2000, HTN, HLD, carotid artery disease s/p left CEA 2018, ischemic cardiomyopathy, prior TIA  presents today for elevated BP  ? ?Past Medical History  ?  ?Past Medical History:  ?Diagnosis Date  ? CAD (coronary artery disease)   ? [facility] 7 [facility] // Echo 11/2018: EF 45-50, antero-apical and inferior akinesis // Myoview 11/2018: EF 51, distal ant/apical infarct and small inferior infarct with very mild peri-infarct ischemia; Low Risk  ? Carotid artery occlusion   ? High cholesterol   ? Hyperlipidemia   ? Hypertension   ? Pre-diabetes   ? < 6.1 2017  ? Stroke [facility](H[facility]) 04/2016  ? TIA- 04/2016 double vision 2nd - speech - no residual  ? 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 ?Past Surgical History:  ?Procedure Laterality Date  ? 4v cabg    ? CARDIAC CATHETERIZATION    ? CARDIAC CATHETERIZATION N/A 03/23/2016  ? Procedure: Left Heart Cath and Cors/Grafts Angiography;  Surgeon: Kathleene Hazelhristopher D McAlhany, MD;  [facility];  Service: Cardiovascular;  Laterality: N/A;  ? CAROTID ANGIOGRAPHY N/A 10/11/2016  ? Procedure: Carotid Angiography / Cerebral angiogram;  Surgeon: Fransisco HertzBrian L Chen, MD;  [facility];  Service: Cardiovascular;  Laterality: N/A;  ? CAROTID ENDARTERECTOMY    ? COLONOSCOPY  2017  ? COLONOSCOPY W/ POLYPECTOMY    ? CORONARY ARTERY BYPASS GRAFT    ? ENDARTERECTOMY Left 10/16/2016  ? Procedure: LEFT CAROTID ENDARTERECTOMY WITH PATCH ANGIOPLASTY;  Surgeon: Fransisco HertzBrian L Chen, MD;  [facility];  Service: Vascular;  Laterality: Left;  ? LEFT HEART CATH AND CORS/GRAFTS ANGIOGRAPHY N/A 01/19/2021  ?  Procedure: LEFT HEART CATH AND CORS/GRAFTS ANGIOGRAPHY;  Surgeon: Kathleene HazelMcAlhany, Christopher D, MD;  [facility]: [facility] LAB;  Service: Cardiovascular;  Laterality: N/A;  ? patent grafts    ? s/p 7    ? UPPER GASTROINTESTINAL ENDOSCOPY    ? ? ?Allergies ? ?Allergies  ?Allergen Reactions  ? Amlodipine   ?  Chest pain   ? Lisinopril Cough  ? ? ?History of Present Illness  ?  ?[name] Sosa is a 66 y.o. male with a hx of CAD s/p CABGx7 at [facility] in [facility], HTN, HLD, carotid artery disease s/p left CEA 2018, ischemic cardiomyopathy, prior TIA last seen 02/13/21 by Dr. Clifton JamesMcAlhany. ? ?He was seen as a new patient January 2011 with complaint of chest pain.  He had been followed from 2000 until 2011 by Dr. Juliann Paresallwood in [place].  Cardiac cath 2011 with 5 out of 7 patent bypass grafts.  Stress Myoview 01/2014 no ischemia, LVEF 48%.  Cardiac cath October 2017 with patent LIMA-LAD, SVG-OM which fills all the circumflex and occluded RCA with occluded SVG-RCA.  The RCA filled with left-to-right collaterals.  Left carotid endarterectomy in May 2018 following a TIA.  Was seen by virtual visit June 2020 noting dyspnea and Norvasc was added.  Nuclear stress test June 2020 with evidence of prior infarct but overall low risk.  Echo June 2020 LVEF 45 to 50% with segmental wall motion abnormality  and no valvular disease.  He was seen July 2022 noted worsening chest pain.  Cardiac cath 01/19/2021 with 2 out of 5 patent grafts (LIMA-LAD, free RIMA-circumflex).  The RCA is occluded and fills from left to right collaterals, LVEF 40 to 45%. ? ?Cardiac cath 01/19/21 with 2/5 patent grafts, collateral filling of occluded RCA. Previously intolerant of Norvasc. Last seen 02/13/21 by Dr. [name] doing overall well from cardiac perspective and recommended to follow up in one year.  ? ?He called the office 09/06/21 noting elevated BP. It was usually 128/82 but that day  180-190. It was down trending to 158/68 when discussed with Ronie Spies, PA. ? ?He presents  today for follow up independently. Tells me Wednesday afternoon he felt not quite right driving home and started swerving some. He had a headache when he got home and his BP was 160s/80s. He checked it an hour later and it was 180s and got as high as 208/98. When he talked to Ronie Spies, PA his BP had started improving. He checks his BP at home and usually 122-128/72-78 HR in the 60s.  Home BP cuff checked for accuracy and found to align with manual check.  He notes he has had intermittent episodes of dizziness with sensation of the room spinning around him.  No prior history of vertigo.  Does note he has a history of orthostatic hypotension.  Drinks coffee in the morning, Pepsi and 1 bottle of water during the day.  Encouraged increased hydration.  He eats 3 meals per day.  He has had no hypotensive blood pressure readings at home.  No near-syncope, syncope, falls.  Denies edema, orthopnea, PND.  He has mild intermittent chest pain that he has had for some time and is unchanged compared to previous.  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 ? ?EKGs/Labs/Other Studies Reviewed:  ? ?The following studies were reviewed today: ? ?Carotid duplex 03/21/21 ? ?Impression:  ? ?Right carotid artery:  Duplex imaging demonstrates no evidence of a  ?hemodynamically significant stenosis. The internal carotid artery  ?demonstrates a less than 50% diameter reduction.  Calcified plaque is  ?noted. The common carotid artery demonstrates a <50% stenosis. The  ?external carotid artery demonstrates a >50% stenosis.  ? ?The right vertebral artery demonstrates antegrade flow. The subclavian  ?artery is patent with triphasic flow. Please see velocities above.  ? ?Left carotid artery:  Duplex imaging demonstrates no evidence of a  ?hemodynamically significant stenosis. Patent carotid artery s/p CEA. No  ?evidence of recurrent or  residual stenosis. The internal carotid artery  ?demonstrates a less than 50% diameter reduction.  No significant plaque is  ?noted. The common carotid artery demonstrates a <50% stenosis. The  ?external carotid artery demonstrates a <50% stenosis.  ? ?The left vertebral artery demonstrates antegrade flow. The subclavian  ?artery is patent with triphasic flow. Please see velocities above.  ? ?LHC 01/19/2021 ?  Ost LM to LM lesion is 60% stenosed. ?  Prox LAD to Mid LAD lesion is 100% stenosed. ?  Ost LAD to Prox LAD lesion is 70% stenosed. ?  Ost Cx to Prox Cx lesion is 100% stenosed. ?  Prox RCA lesion is 100% stenosed. ?  Origin to Prox Graft lesion is 100% stenosed. ?  Origin lesion is 100% stenosed. ?  Origin to Prox Graft lesion is 100% stenosed. ?  Non-stenotic Prox Graft lesion. ?  LIMA  and is normal in caliber. ?  RIMA and is normal in caliber. ?  SVG. ?  SVG. ?  SVG. ?  There is mild left ventricular systolic dysfunction. ?  The left ventricular ejection fraction is 40-45% by visual estimate. ?  There is no mitral valve regurgitation. ?  ?  ?1. Triple vessel CAD s/p 5V CABG with 2/5 patent bypass grafts ?2. LAD is occluded mid segment. LIMA graft is patent to the LAD ?3. Circumflex is occluded proximally. The free RIMA graft to the first OM is patent and fills the entire Circumflex including all marginal branches. ?4. The RCA is chronically occluded proximally. The vein graft to the RCA is known to be occluded and was not injected.  The distal RCA fills from left to right collaterals. ?5. Both SVG grafts to Diagonal 1 and Diagonal 2 are known to be occluded and were not injected ?6. Moderate systolic LV dysfunction, LVEF=40-45%. ?  ?Recommendations: Continue medical management of CAD ? ?Echo 11/2018 ? 1. The left ventricle has mildly reduced systolic function, with an  ?ejection fraction of 45-50%. The cavity size was normal. Left ventricular  ?diastolic parameters were normal. Indeterminate filling pressures  No  ?evidence of left ventricular regional wall  ? motion abnormalities.  ? 2. Severe akinesis of the left ventricular, mid-apical anterior wall and  ?inferior wall.  ? 3. The right ventricle has normal systolic

## 2021-09-11 NOTE — Progress Notes (Signed)
? ?Established Patient Office Visit ? ?Subjective:  ?Patient ID: [name] Sosa, male    DOB: [birth year]  Age: 66 y.o. MRN: [id number] ? ?CC:  ?Chief Complaint  ?Patient presents with  ? Dizziness  ?  Patient complains of vertigo, x4 days   ? ? ?HPI ?[name] Sosa presents for onset last Wednesday of intermittent dizziness which he describes as vertigo.  His past medical history is significant for CAD, peripheral vascular disease, hypertension, hyperlipidemia.  He states last Wednesday he was driving and felt like he had difficulty focusing on the lines because of some sensation of spinning.  He has had some intermittent symptoms since then.  No clear triggering factors.  Occasional associated nausea without vomiting.  He also relates some intermittent occipital bilateral headaches.  He describes as a dull headache.  No regular use of analgesics.  Denies any speech changes, visual changes, dysphagia, focal weakness, confusion.  No orthostatic symptoms. ? ?Symptoms are very intermittent and can last sometimes for several hours but then clear.  He went to cardiology earlier today and no evidence for orthostasis.  At 1 point, he thought his symptoms may be worse after taking lisinopril but no clear correlation.  He had order written for labs including CBC, basic metabolic panel, and TSH.  No chest pains currently. ? ?Past Medical History:  ?Diagnosis Date  ? CAD (coronary artery disease)   ? s/p 7 vessel CABG 2000 at [facility] // Echo 11/2018: EF 45-50, antero-apical and inferior akinesis // Myoview 11/2018: EF 51, distal ant/apical infarct and small inferior infarct with very mild peri-infarct ischemia; Low Risk  ? Carotid artery occlusion   ? High cholesterol   ? Hyperlipidemia   ? Hypertension   ? Pre-diabetes   ? < 6.1 2017  ? Stroke [facility]) 04/2016  ? TIA- 04/2016 double vision 2nd - speech - no residual  ? 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 ? ?Past Surgical History:  ?Procedure Laterality Date  ? 4v cabg    ?  CARDIAC CATHETERIZATION    ? CARDIAC CATHETERIZATION N/A 03/23/2016  ? Procedure: Left Heart Cath and Cors/Grafts Angiography;  Surgeon: Burnell Blanks, MD;  Location: [place] CV LAB;  Service: Cardiovascular;  Laterality: N/A;  ? CAROTID ANGIOGRAPHY N/A 10/11/2016  ? Procedure: Carotid Angiography / Cerebral angiogram;  Surgeon: Conrad [place], MD;  Location: [place] CV LAB;  Service: Cardiovascular;  Laterality: N/A;  ? CAROTID ENDARTERECTOMY    ? COLONOSCOPY  2017  ? COLONOSCOPY W/ POLYPECTOMY    ? CORONARY ARTERY BYPASS GRAFT    ? ENDARTERECTOMY Left 10/16/2016  ? Procedure: LEFT CAROTID ENDARTERECTOMY WITH PATCH ANGIOPLASTY;  Surgeon: Conrad [place], MD;  Location: [place];  Service: Vascular;  Laterality: Left;  ? LEFT HEART CATH AND CORS/GRAFTS ANGIOGRAPHY N/A 01/19/2021  ? Procedure: LEFT HEART CATH AND CORS/GRAFTS ANGIOGRAPHY;  Surgeon: Burnell Blanks, MD;  Location: [place] CV LAB;  Service: Cardiovascular;  Laterality: N/A;  ? patent grafts    ? s/p 7    ? UPPER GASTROINTESTINAL ENDOSCOPY    ? ? ?Family History  ?Problem Relation Age of Onset  ? Heart failure Mother   ? Hypertension Mother   ? Heart disease Mother   ? Heart failure Father   ? Hypertension Father   ? Heart disease Father   ?     before age 88  ? Hypertension Sister   ? Hypertension Brother   ? Heart attack Neg Hx   ? Stroke  Neg Hx   ? ? ?Social History  ? ?Socioeconomic History  ? Marital status: Single  ?  Spouse name: Not on file  ? Number of children: 3  ? Years of education: AS + 2  ? Highest education level: Not on file  ?Occupational History  ? Occupation: [employer]  ?Tobacco Use  ? Smoking status: Former  ?  Years: 2.00  ?  Types: Cigarettes  ?  Quit date: 06/19/1998  ?  Years since quitting: 23.2  ? Smokeless tobacco: Never  ?Vaping Use  ? Vaping Use: Never used  ?Substance and Sexual Activity  ? Alcohol use: No  ? Drug use: No  ? Sexual activity: Yes  ?Other Topics Concern  ? Not on file  ?Social History Narrative  ?  Lives at home alone  ? Right-handed  ? Caffeine: 2 cups per day  ? ?Social Determinants of Health  ? ?Financial Resource Strain: Not on file  ?Food Insecurity: Not on file  ?Transportation Needs: Not on file  ?Physical Activity: Not on file  ?Stress: Not on file  ?Social Connections: Not on file  ?Intimate Partner Violence: Not on file  ? ? ?Outpatient Medications Prior to Visit  ?Medication Sig Dispense Refill  ? aspirin EC 81 MG tablet Take 1 tablet (81 mg total) by mouth daily. Swallow whole. 90 tablet 3  ? carvedilol (COREG) 3.125 MG tablet Take 1 tablet (3.125 mg total) by mouth 2 (two) times daily with a meal. 180 tablet 2  ? clotrimazole-betamethasone (LOTRISONE) cream Apply 1 application topically daily. 30 g 1  ? lisinopril (ZESTRIL) 10 MG tablet Take 1 tablet by mouth once daily 90 tablet 2  ? nitroGLYCERIN (NITROSTAT) 0.4 MG SL tablet Place 1 tablet (0.4 mg total) under the tongue every 5 (five) minutes as needed for chest pain (up to 3 doses). 25 tablet 1  ? ranolazine (RANEXA) 500 MG 12 hr tablet Take 1 tablet by mouth twice daily 180 tablet 2  ? rosuvastatin (CRESTOR) 20 MG tablet Take 1 tablet by mouth once daily 90 tablet 3  ? ?No facility-administered medications prior to visit.  ? ? ?Allergies  ?Allergen Reactions  ? Amlodipine   ?  Chest pain   ? Lisinopril Cough  ? ? ?ROS ?Review of Systems  ?Constitutional:  Negative for chills and fever.  ?Respiratory:  Negative for shortness of breath.   ?Cardiovascular:  Negative for chest pain.  ?Neurological:  Positive for dizziness and headaches. Negative for seizures, syncope, facial asymmetry, speech difficulty, weakness and numbness.  ?Psychiatric/Behavioral:  Negative for confusion.   ? ?  ?Objective:  ?  ?Physical Exam ?Vitals reviewed.  ?Cardiovascular:  ?   Rate and Rhythm: Normal rate and regular rhythm.  ?Pulmonary:  ?   Effort: Pulmonary effort is normal.  ?   Breath sounds: Normal breath sounds.  ?Musculoskeletal:  ?   Cervical back: Neck  supple.  ?Neurological:  ?   General: No focal deficit present.  ?   Mental Status: He is alert and oriented to person, place, and time.  ?   Cranial Nerves: No cranial nerve deficit.  ?   Motor: No weakness.  ?   Coordination: Coordination normal.  ?   Gait: Gait normal.  ?   Comments: No ataxia by finger-nose testing.  Gait normal.  No obvious nystagmus at this time. ? ?We were unable to reproduce any vertigo today on exam-with change of position.  ? ? ?BP 126/62 (BP Location: Left  Arm, Patient Position: Sitting, Cuff Size: Normal)   Pulse 67   Temp (!) 97.4 ?F (36.3 ?C) (Oral)   Ht 5' 7" (1.702 m)   Wt 136 lb 11.2 oz (62 kg)   SpO2 98%   BMI 21.41 kg/m?  ?Wt Readings from Last 3 Encounters:  ?09/11/21 136 lb 11.2 oz (62 kg)  ?09/11/21 136 lb (61.7 kg)  ?05/24/21 132 lb 6.4 oz (60.1 kg)  ? ? ? ?Health Maintenance Due  ?Topic Date Due  ? HIV Screening  Never done  ? Hepatitis C Screening  Never done  ? Zoster Vaccines- Shingrix (1 of 2) Never done  ? TETANUS/TDAP  06/18/2018  ? COVID-19 Vaccine (3 - Booster for Pfizer series) 12/09/2019  ? Pneumonia Vaccine 44+ Years old (1 - PCV) 08/24/2021  ? ? ?There are no preventive care reminders to display for this patient. ? ?Lab Results  ?Component Value Date  ? TSH 0.57 10/24/2016  ? ?Lab Results  ?Component Value Date  ? WBC 6.6 01/12/2021  ? HGB 14.3 01/12/2021  ? HCT 42.2 01/12/2021  ? MCV 94 01/12/2021  ? PLT 266 01/12/2021  ? ?Lab Results  ?Component Value Date  ? NA 140 01/12/2021  ? K 4.5 01/12/2021  ? CO2 24 01/12/2021  ? GLUCOSE 104 (H) 01/12/2021  ? BUN 14 01/12/2021  ? CREATININE 0.93 01/12/2021  ? BILITOT 0.5 10/04/2020  ? ALKPHOS 42 10/04/2020  ? AST 21 10/04/2020  ? ALT 19 10/04/2020  ? PROT 6.8 10/04/2020  ? ALBUMIN 4.1 10/04/2020  ? CALCIUM 9.7 01/12/2021  ? ANIONGAP 7 10/17/2016  ? EGFR 92 01/12/2021  ? GFR 90.81 10/04/2020  ? ?Lab Results  ?Component Value Date  ? CHOL 125 10/04/2020  ? ?Lab Results  ?Component Value Date  ? HDL 49.70 10/04/2020   ? ?Lab Results  ?Component Value Date  ? Green 42 10/04/2020  ? ?Lab Results  ?Component Value Date  ? TRIG 170.0 (H) 10/04/2020  ? ?Lab Results  ?Component Value Date  ? CHOLHDL 3 10/04/2020  ? ?Lab Results  ?Compone

## 2021-09-11 NOTE — Patient Instructions (Addendum)
Medication Instructions:  ?Your physician has recommended you make the following change in your medication:  ? ?CHANGE Aspirin to EC (enteric coated) and swallow rather than chewing.  ?  ?*If you need a refill on your cardiac medications before your next appointment, please call your pharmacy* ? ? ?Lab Work: ?Your physician recommends that you return for lab work this week: BMP, TSH, CBC  ? ?Can have with Dr. Caryl Never at the below locations: ? ?Please return for Lab work. You may come to the...  ? ?[facility] (3rd floor) ?[address] [place], [place] [address]  ?Open: 8am-Noon and 1pm-4:30pm  ? ?[facility] at [facility] ?[address]  ? ?[employer]- Any location ? ?**no appointments needed**  ? ?If you have labs (blood work) drawn today and your tests are completely normal, you will receive your results only by: ?MyChart Message (if you have MyChart) OR ?A paper copy in the mail ?If you have any lab test that is abnormal or we need to change your treatment, we will call you to review the results. ? ? ?Testing/Procedures: ?None ordered today. If you continue to have episodes of dizziness we will consider an echocardiogram. Alver Sorrow, NP will send you a note in 2 weeks via MyChart to check in.  ? ?Follow-Up: ?At [facility], you and your health needs are our priority.  As part of our continuing mission to provide you with exceptional heart care, we have created designated Provider Care Teams.  These Care Teams include your primary Cardiologist (physician) and Advanced Practice Providers (APPs -  Physician Assistants and Nurse Practitioners) who all work together to provide you with the care you need, when you need it. ? ?We recommend signing up for the patient portal called "MyChart".  Sign up information is provided on this After Visit Summary.  MyChart is used to connect with patients for Virtual Visits (Telemedicine).  Patients are able to view lab/test  results, encounter notes, upcoming appointments, etc.  Non-urgent messages can be sent to your provider as well.   ?To learn more about what you can do with MyChart, go to [email or web address].   ? ?Your next appointment:   ?3-4 month(s) ? ?The format for your next appointment:   ?In Person ? ?Provider:   ?Verne Carrow, MD  or Advanced Practice Provider ? ? ?Other Instructions ? ?Tips to Measure your Blood Pressure Correctly ? ?To determine whether you have hypertension, a medical professional will take a blood pressure reading. How you prepare for the test, the position of your arm, and other factors can change a blood pressure reading by 10% or more. That could be enough to hide high blood pressure, start you on a drug you don't really need, or lead your doctor to incorrectly adjust your medications. ? ?National and international guidelines offer specific instructions for measuring blood pressure. If a doctor, nurse, or medical assistant isn't doing it right, don't hesitate to ask him or her to get with the guidelines. ? ?Here's what you can do to ensure a correct reading: ? Don't drink a caffeinated beverage or smoke during the 30 minutes before the test. ? Sit quietly for five minutes before the test begins. ? During the measurement, sit in a chair with your feet on the floor and your arm supported so your elbow is at about heart level. ? The inflatable part of the cuff should completely cover at least 80% of your upper arm, and the cuff should  be placed on bare skin, not over a shirt. ? Don't talk during the measurement. ? Have your blood pressure measured twice, with a brief break in between. If the readings are different by 5 points or more, have it done a third time. ? ?In 2017, new guidelines from the American Heart Association, the [employer] of Cardiology, and nine other health organizations lowered the diagnosis of high blood pressure to 130/80 mm Hg or higher for all adults. The  guidelines also redefined the various blood pressure categories to now include normal, elevated, Stage 1 hypertension, Stage 2 hypertension, and hypertensive crisis (see "Blood pressure categories"). ? ?Blood pressure categories  ?Blood pressure category SYSTOLIC ?(upper number)  DIASTOLIC ?(lower number)  ?Normal Less than 120 mm Hg and Less than 80 mm Hg  ?Elevated 120-129 mm Hg and Less than 80 mm Hg  ?High blood pressure: Stage 1 hypertension 130-139 mm Hg or 80-89 mm Hg  ?High blood pressure: Stage 2 hypertension 140 mm Hg or higher or 90 mm Hg or higher  ?Hypertensive crisis (consult your doctor immediately) Higher than 180 mm Hg and/or Higher than 120 mm Hg  ?Source: American Heart Association and American Stroke Association. ?For more on getting your blood pressure under control, buy Controlling Your Blood Pressure, a Special Health Report from [facility]. ? ? ?Blood Pressure Log ? ? ?Date ?  ?Time  ?Blood Pressure  ?Example: Nov 1 9 AM 124/78  ? ?    ? ?    ? ?    ? ?    ? ?    ? ?    ? ?    ? ?    ? ? ?   ?

## 2021-09-18 ENCOUNTER — Encounter: Payer: Self-pay | Admitting: Family Medicine

## 2021-09-18 ENCOUNTER — Telehealth: Payer: Self-pay | Admitting: Family Medicine

## 2021-09-18 DIAGNOSIS — G4452 New daily persistent headache (NDPH): Secondary | ICD-10-CM

## 2021-09-18 NOTE — Telephone Encounter (Signed)
Let him know I am setting up CT head to further assess.  His headache and dizziness seem very atypical and this needs further assessment. ?If he develops any new neurologic symptoms or severe progressive weakness or worsening headache go ahead to ER ?

## 2021-09-18 NOTE — Telephone Encounter (Signed)
Pt is calling and was seen for vertigo and still having dizziness,lightheadedness and ha and would like to know the next step. Pt was seen on 09-11-2021 ?

## 2021-09-18 NOTE — Telephone Encounter (Signed)
Mychart message sent in regards to previous note   ?

## 2021-09-25 ENCOUNTER — Ambulatory Visit (INDEPENDENT_AMBULATORY_CARE_PROVIDER_SITE_OTHER)
Admission: RE | Admit: 2021-09-25 | Discharge: 2021-09-25 | Disposition: A | Discharge status: Home / Self Care | Payer: BC Managed Care – PPO | Source: Ambulatory Visit | Attending: Family Medicine | Admitting: Family Medicine

## 2021-09-25 DIAGNOSIS — G4452 New daily persistent headache (NDPH): Secondary | ICD-10-CM

## 2021-09-26 ENCOUNTER — Encounter (HOSPITAL_BASED_OUTPATIENT_CLINIC_OR_DEPARTMENT_OTHER): Payer: Self-pay

## 2021-09-26 ENCOUNTER — Encounter: Payer: Self-pay | Admitting: Family Medicine

## 2021-09-26 DIAGNOSIS — R42 Dizziness and giddiness: Secondary | ICD-10-CM

## 2021-09-26 NOTE — Telephone Encounter (Signed)
BP does not seem to be dropping too low to cause symptoms.Continue current medications.  ? ?Recommend carotid duplex and echocardiogram to further evaluate dizziness, lightheadedness.  ? ?Alver Sorrow, NP  ?

## 2021-09-26 NOTE — Telephone Encounter (Signed)
BP log as requested 

## 2021-09-26 NOTE — Telephone Encounter (Signed)
It seems like pt needs a follow up appt. ?If symptoms suddenly worse he needs to go to the ED. ?Thanks, ?BJ ?

## 2021-09-28 ENCOUNTER — Ambulatory Visit: Payer: BC Managed Care – PPO | Admitting: Family

## 2021-09-29 ENCOUNTER — Telehealth (HOSPITAL_BASED_OUTPATIENT_CLINIC_OR_DEPARTMENT_OTHER): Payer: Self-pay | Admitting: Family

## 2021-09-29 NOTE — Telephone Encounter (Signed)
Left message for patient to call and discuss scheduling the Echocardiogram and Carotid doppler ordered by Gillian Shields, NP  ?

## 2021-10-03 ENCOUNTER — Encounter: Payer: Self-pay | Admitting: Family Medicine

## 2021-10-03 ENCOUNTER — Ambulatory Visit: Payer: BC Managed Care – PPO | Admitting: Family Medicine

## 2021-10-03 VITALS — BP 114/60 | HR 66 | Temp 97.8°F | Ht 67.0 in | Wt 135.4 lb

## 2021-10-03 DIAGNOSIS — R519 Headache, unspecified: Secondary | ICD-10-CM | POA: Diagnosis not present

## 2021-10-03 NOTE — Patient Instructions (Signed)
I will set up referral to [facility] Neurology re: intermittent headaches and left lower extremity weakness   ?

## 2021-10-03 NOTE — Progress Notes (Signed)
?Subjective:  ?  ? Patient ID: Jamal Pavon, male   DOB: December 13, 1955, 66 y.o.   MRN: [id number] ? ?HPI ? ?Patient here to follow-up regarding recent headaches and vertigo symptoms.  Refer to previous note from 09-11-2021 for details. ? ?Kayla Weekes presents for onset last Wednesday of intermittent dizziness which he describes as vertigo.  His past medical history is significant for CAD, peripheral vascular disease, hypertension, hyperlipidemia.  He states last Wednesday he was driving and felt like he had difficulty focusing on the lines because of some sensation of spinning.  He has had some intermittent symptoms since then.  No clear triggering factors.  Occasional associated nausea without vomiting.  He also relates some intermittent occipital bilateral headaches.  He describes as a dull headache.  No regular use of analgesics.  Denies any speech changes, visual changes, dysphagia, focal weakness, confusion.  No orthostatic symptoms. ?  ?Symptoms are very intermittent and can last sometimes for several hours but then clear.  He went to cardiology earlier today and no evidence for orthostasis.  At 1 point, he thought his symptoms may be worse after taking lisinopril but no clear correlation.  He had order written for labs including CBC, basic metabolic panel, and TSH.  No chest pains currently. ? ?We are not getting CAT scan of the head which showed no acute abnormality.  No bleed.  No evidence for tumor or CVA.  No sinusitis.  Patient does state his vertigo symptoms have essentially resolved.  However, he still having intermittent headaches.  He did not have any Sunday or Tuesday but has had some today and most days since March.  These are mostly occipital and left side greater than right.  No visual disturbance.  He is taken some Tylenol which does help but not consistently taking analgesics.  Nonexertional. ? ?He also relates question of some intermittent weakness left lower extremity.  No lumbar pain.   Symptoms are very intermittent.  Sensation of left lower extremity giving out at times.  No left lower extremity numbness.  No urine or stool incontinence.  No left upper extremity weakness. ? 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 ?He denies any recent chest pains.  He does have cardiac history and peripheral vascular disease.  Previous left carotid endarterectomy.  Cardiology recently proposed repeat carotid ultrasound and echocardiogram ? ?Wt Readings from Last 3 Encounters:  ?10/03/21 135 lb 6.4 oz (61.4 kg)  ?09/11/21 136 lb 11.2 oz (62 kg)  ?09/11/21 136 lb (61.7 kg)  ? ? ? ?Past Medical History:  ?Diagnosis Date  ? CAD (coronary artery disease)   ? s/p 7 vessel CABG 2000 at [facility] // Echo 11/2018: EF 45-50, antero-apical and inferior akinesis // Myoview 11/2018: EF 51, distal ant/apical infarct and small inferior infarct with very mild peri-infarct ischemia; Low Risk  ? Carotid artery occlusion   ? High cholesterol   ? Hyperlipidemia   ? Hypertension   ? Pre-diabetes   ? < 6.1 2017  ? Stroke [facility]) 04/2016  ? TIA- 04/2016 double vision 2nd - speech - no residual  ? 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 ?Past Surgical History:  ?Procedure Laterality Date  ? 4v cabg    ? CARDIAC CATHETERIZATION    ? CARDIAC CATHETERIZATION N/A 03/23/2016  ? Procedure: Left Heart Cath and Cors/Grafts Angiography;  Surgeon: Kathleene Hazel, MD;  Location: [facility] INVASIVE CV LAB;  Service: Cardiovascular;  Laterality:  N/A;  ? CAROTID ANGIOGRAPHY N/A 10/11/2016  ? Procedure: Carotid Angiography / Cerebral angiogram;  Surgeon: Fransisco Hertz, MD;  Location: [facility] INVASIVE CV LAB;  Service: Cardiovascular;  Laterality: N/A;  ? CAROTID ENDARTERECTOMY    ? COLONOSCOPY  2017  ? COLONOSCOPY W/ POLYPECTOMY    ? CORONARY ARTERY BYPASS GRAFT    ? ENDARTERECTOMY Left 10/16/2016  ? Procedure: LEFT CAROTID ENDARTERECTOMY WITH PATCH ANGIOPLASTY;  Surgeon:  Fransisco Hertz, MD;  Location: [facility] OR;  Service: Vascular;  Laterality: Left;  ? LEFT HEART CATH AND CORS/GRAFTS ANGIOGRAPHY N/A 01/19/2021  ? Procedure: LEFT HEART CATH AND CORS/GRAFTS ANGIOGRAPHY;  Surgeon: Kathleene Hazel, MD;  Location: [facility] INVASIVE CV LAB;  Service: Cardiovascular;  Laterality: N/A;  ? patent grafts    ? s/p 7    ? UPPER GASTROINTESTINAL ENDOSCOPY    ? ? reports that he quit smoking about 23 years ago. His smoking use included cigarettes. He has never used smokeless tobacco. He reports that he does not drink alcohol and does not use drugs. ?family history includes Heart disease in his father and mother; Heart failure in his father and mother; Hypertension in his brother, father, mother, and sister. ?Allergies  ?Allergen Reactions  ? Amlodipine   ?  Chest pain   ? Lisinopril Cough  ? ? ? ?Review of Systems  ?Constitutional:  Negative for chills and fever.  ?Eyes:  Negative for visual disturbance.  ?Respiratory:  Negative for cough and shortness of breath.   ?Cardiovascular:  Negative for chest pain and palpitations.  ?Neurological:  Positive for weakness and headaches. Negative for seizures, syncope, facial asymmetry and speech difficulty.  ?     See HPI  ?Psychiatric/Behavioral:  Negative for confusion.   ? ?   ?Objective:  ? Physical Exam ?Vitals reviewed.  ?Constitutional:   ?   Appearance: Normal appearance.  ?Cardiovascular:  ?   Rate and Rhythm: Normal rate and regular rhythm.  ?Pulmonary:  ?   Effort: Pulmonary effort is normal.  ?   Breath sounds: Normal breath sounds.  ?Neurological:  ?   General: No focal deficit present.  ?   Mental Status: He is alert.  ?   Cranial Nerves: No cranial nerve deficit.  ?   Comments: He has some weakness with left hip flexion compared to right.  Good strength with knee extension bilaterally as well as plantarflexion dorsiflexion. ?2+ reflexes knee and ankle bilaterally.  ? ? ?   ?Assessment:  ?   ?#1 recent onset of occipital headache left greater than  right.  No history of migraines.  Recent CT head unremarkable.  Nonfocal exam currently.  Headaches improved with analgesics and he is not taking these regularly ? ?#2 reports of intermittent left lower extremity weakness.  Does demonstrate some weakness left hip flexion compared to right on exam otherwise nonfocal exam ?   ?Plan:  ?   ?Reviewed results of recent CT head ? ?Recommend setting up neurology referral regarding his new onset recent headache. ? ?-Reviewed importance of avoiding overuse of analgesics ? ?-Follow-up immediately for any visual changes, progressive headache, or any other new symptoms. ? ?Kristian Covey MD ?[facility] ? ?   ?

## 2021-10-04 NOTE — Telephone Encounter (Signed)
Left message for patient to call and discuss scheduling the Echocardiogram ordered by Caitlin Walker, NP 

## 2021-10-06 NOTE — Telephone Encounter (Signed)
Left message for patient to call and discuss scheduling the Carotid doppler and Echocardiogram ordered by Laurann Montana, NP ?

## 2021-10-10 ENCOUNTER — Telehealth: Payer: Self-pay | Admitting: Neurology

## 2021-10-10 ENCOUNTER — Encounter: Payer: Self-pay | Admitting: Neurology

## 2021-10-10 ENCOUNTER — Ambulatory Visit (INDEPENDENT_AMBULATORY_CARE_PROVIDER_SITE_OTHER): Payer: BC Managed Care – PPO | Admitting: Neurology

## 2021-10-10 VITALS — BP 131/78 | HR 71 | Ht 68.0 in | Wt 134.0 lb

## 2021-10-10 DIAGNOSIS — R7303 Prediabetes: Secondary | ICD-10-CM | POA: Insufficient documentation

## 2021-10-10 DIAGNOSIS — R531 Weakness: Secondary | ICD-10-CM

## 2021-10-10 MED ORDER — VENLAFAXINE HCL ER 75 MG PO CP24: 75.0000 mg | ORAL_CAPSULE | Freq: Every day | ORAL | 11 refills | Status: DC

## 2021-10-10 MED ORDER — CLOPIDOGREL BISULFATE 75 MG PO TABS: 75.0000 mg | ORAL_TABLET | Freq: Every day | ORAL | 11 refills | Status: DC

## 2021-10-10 NOTE — Progress Notes (Signed)
? ?Chief Complaint  ?Patient presents with  ? New Patient (Initial Visit)  ?  Rm 15. Alone. ?NX [facility]/internal referral for occipital headaches and LLE weakness. ?C/o daily headaches, usually 2-3 hours after waking. Sleep (even 15-20 minutes) and Tylenol help resolve headaches. States headache pain is dull, currently has headache that is a 4-5 on the pain scale.  ? ? ? ? ?ASSESSMENT AND PLAN ? ?[name] Sosa is a 66 y.o. male   ?Acute onset of left-sided weakness ? Mild left arm and leg weakness, ? Multiple vascular risk factors, most concerned about right hemisphere small vessel acute stroke ? MRI of the brain ? ?Frequent dizziness, headache, also angry outburst ? Effexor xr 75 mg daily as preventive medications ? Laboratory evaluation including ESR C-reactive protein for new onset headaches ? ?DIAGNOSTIC DATA (LABS, IMAGING, TESTING) ?- I reviewed patient records, labs, notes, testing and imaging myself where available. ? ? ?MEDICAL HISTORY: ? ?[name] Sosa is a 66 year old male, seen in request by his primary care physician Dr. [name], for evaluation of headache, left-sided weakness, initial evaluation was on October 10, 2021. ? ?I reviewed and summarized the referring note. PMHX. ?CAD, s/p CABG ?Left Carotid Artery stenosis, status post left endarterectomy in 2018 ?HTN ?HLD ?TIA ? ?He reported acute onset of left-sided weakness on September 07, 2021, he was driving his from school, then he noticed that he has difficulty controlling his steering wheel using left hand, when he stepped out of the car, he noticed difficulty walking, left leg is weak, he also reported headache that lasting for 30 minutes, some lightheaded sensation ? ?Ever since then, he has mild difficulty walking, also increased frequency of headaches, about once a week, holoacranial pressure headaches, lasting about couple hours ? ?Ultrasound of carotid artery in 2019 showed right internal carotid artery 40 to 59% stenosis, left  internal carotid artery less than 39% stenosis ? ?In addition, he reported frequent angry outbursts, difficult to control his emotion ? ?PHYSICAL EXAM: ?  ?Vitals:  ? 10/10/21 1009  ?BP: 131/78  ?Pulse: 71  ?Weight: 134 lb (60.8 kg)  ?Height: $RemoveB'5\' 8"'bgxEEgFS$  (1.727 m)  ? ?Not recorded ?  ? ? ?Body mass index is 20.37 kg/m?. ? ?PHYSICAL EXAMNIATION: ? ?Gen: NAD, conversant, well nourised, well groomed                     ?Cardiovascular: Regular rate rhythm, no peripheral edema, warm, nontender. ?Eyes: Conjunctivae clear without exudates or hemorrhage ?Neck: Supple, no carotid bruits. ?Pulmonary: Clear to auscultation bilaterally  ? ?NEUROLOGICAL EXAM: ? ?MENTAL STATUS: ?Speech: ?   Speech is normal; fluent and spontaneous with normal comprehension.  ?Cognition: ?    Orientation to time, place and person ?    Normal recent and remote memory ?    Normal Attention span and concentration ?    Normal Language, naming, repeating,spontaneous speech ?    Fund of knowledge ?  ?CRANIAL NERVES: ?CN II: Visual fields are full to confrontation. Pupils are round equal and briskly reactive to light. ?CN III, IV, VI: extraocular movement are normal. No ptosis. ?CN V: Facial sensation is intact to light touch ?CN VII: Face is symmetric with normal eye closure  ?CN VIII: Hearing is normal to causal conversation. ?CN IX, X: Phonation is normal. ?CN XI: Head turning and shoulder shrug are intact ? ?MOTOR: Fixation of left arm upon rapid rotating movement, mild drift of left leg ? ?REFLEXES: ?Reflexes are 2+ and symmetric at  the biceps, triceps, 3/3 knees, and ankles. Plantar responses were flexor on the right, extensor on the left   ? ?SENSORY: ?Intact to light touch, pinprick and vibratory sensation are intact in fingers and toes. ? ?COORDINATION: ?There is no trunk or limb dysmetria noted. ? ?GAIT/STANCE: He can get up from seated position arm crossed, difficulty standing up on the left leg, ? ?REVIEW OF SYSTEMS:  ?Full 14 system review of  systems performed and notable only for as above ?All other review of systems were negative. ? ? ?ALLERGIES: ?Allergies  ?Allergen Reactions  ? Amlodipine   ?  Chest pain   ? Lisinopril Cough  ? ? ?HOME MEDICATIONS: ?Current Outpatient Medications  ?Medication Sig Dispense Refill  ? aspirin EC 81 MG tablet Take 1 tablet (81 mg total) by mouth daily. Swallow whole. 90 tablet 3  ? carvedilol (COREG) 3.125 MG tablet Take 1 tablet (3.125 mg total) by mouth 2 (two) times daily with a meal. 180 tablet 2  ? lisinopril (ZESTRIL) 10 MG tablet Take 1 tablet by mouth once daily 90 tablet 2  ? ranolazine (RANEXA) 500 MG 12 hr tablet Take 1 tablet by mouth twice daily 180 tablet 2  ? rosuvastatin (CRESTOR) 20 MG tablet Take 1 tablet by mouth once daily 90 tablet 3  ? nitroGLYCERIN (NITROSTAT) 0.4 MG SL tablet Place 1 tablet (0.4 mg total) under the tongue every 5 (five) minutes as needed for chest pain (up to 3 doses). (Patient not taking: Reported on 10/10/2021) 25 tablet 1  ? ?No current facility-administered medications for this visit.  ? ? ?PAST MEDICAL HISTORY: ?Past Medical History:  ?Diagnosis Date  ? CAD (coronary artery disease)   ? s/p 7 vessel CABG 2000 at [facility] // Echo 11/2018: EF 45-50, antero-apical and inferior akinesis // Myoview 11/2018: EF 51, distal ant/apical infarct and small inferior infarct with very mild peri-infarct ischemia; Low Risk  ? Carotid artery occlusion   ? High cholesterol   ? Hyperlipidemia   ? Hypertension   ? Pre-diabetes   ? < 6.1 2017  ? Stroke [facility]) 04/2016  ? TIA- 04/2016 double vision 2nd - speech - no residual  ? 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 ? ?PAST SURGICAL HISTORY: ?Past Surgical History:  ?Procedure Laterality Date  ? 4v cabg    ? CARDIAC CATHETERIZATION    ? CARDIAC CATHETERIZATION N/A 03/23/2016  ? Procedure: Left Heart Cath and Cors/Grafts Angiography;  Surgeon: Burnell Blanks, MD;  Location: [place] CV LAB;  Service: Cardiovascular;  Laterality: N/A;  ?  CAROTID ANGIOGRAPHY N/A 10/11/2016  ? Procedure: Carotid Angiography / Cerebral angiogram;  Surgeon: Conrad [place], MD;  Location: [place] CV LAB;  Service: Cardiovascular;  Laterality: N/A;  ? CAROTID ENDARTERECTOMY    ? COLONOSCOPY  2017  ? COLONOSCOPY W/ POLYPECTOMY    ? CORONARY ARTERY BYPASS GRAFT    ? ENDARTERECTOMY Left 10/16/2016  ? Procedure: LEFT CAROTID ENDARTERECTOMY WITH PATCH ANGIOPLASTY;  Surgeon: Conrad [place], MD;  Location: [place];  Service: Vascular;  Laterality: Left;  ? LEFT HEART CATH AND CORS/GRAFTS ANGIOGRAPHY N/A 01/19/2021  ? Procedure: LEFT HEART CATH AND CORS/GRAFTS ANGIOGRAPHY;  Surgeon: Burnell Blanks, MD;  Location: [place] CV LAB;  Service: Cardiovascular;  Laterality: N/A;  ? patent grafts    ? s/p 7    ? UPPER GASTROINTESTINAL ENDOSCOPY    ? ? ?FAMILY HISTORY: ?Family History  ?Problem Relation Age of Onset  ? Heart failure Mother   ?  Hypertension Mother   ? Heart disease Mother   ? Heart failure Father   ? Hypertension Father   ? Heart disease Father   ?     before age 43  ? Hypertension Sister   ? Hypertension Brother   ? Heart attack Neg Hx   ? Stroke Neg Hx   ? ? ?SOCIAL HISTORY: ?Social History  ? ?Socioeconomic History  ? Marital status: Single  ?  Spouse name: Not on file  ? Number of children: 3  ? Years of education: AS + 2  ? Highest education level: Not on file  ?Occupational History  ? Occupation: [employer]  ?Tobacco Use  ? Smoking status: Former  ?  Years: 2.00  ?  Types: Cigarettes  ?  Quit date: 06/19/1998  ?  Years since quitting: 23.3  ? Smokeless tobacco: Never  ?Vaping Use  ? Vaping Use: Never used  ?Substance and Sexual Activity  ? Alcohol use: No  ? Drug use: No  ? Sexual activity: Yes  ?Other Topics Concern  ? Not on file  ?Social History Narrative  ? Lives at home alone  ? Right-handed  ? Caffeine: 2 cups per day  ? ?Social Determinants of Health  ? ?Financial Resource Strain: Not on file  ?Food Insecurity: Not on file  ?Transportation Needs: Not on file   ?Physical Activity: Not on file  ?Stress: Not on file  ?Social Connections: Not on file  ?Intimate Partner Violence: Not on file  ? ? ? ? ?Marcial Pacas, M.D. Ph.D. ? ?[facility] ?[place], [place]

## 2021-10-10 NOTE — Patient Instructions (Addendum)
Stop Aspirin 81mg  daily ? ?Start Plavix 75mg  daily ?

## 2021-10-10 NOTE — Telephone Encounter (Signed)
patient will have Medicare after May and would like to schedule after that-order sent to GI ?

## 2021-10-11 ENCOUNTER — Telehealth: Payer: Self-pay | Admitting: Neurology

## 2021-10-11 LAB — CBC WITH DIFFERENTIAL/PLATELET
Basophils Absolute: 0 10*3/uL (ref 0.0–0.2)
Basos: 0 %
EOS (ABSOLUTE): 0.2 10*3/uL (ref 0.0–0.4)
Eos: 3 %
Hematocrit: 43.3 % (ref 37.5–51.0)
Hemoglobin: 14.8 g/dL (ref 13.0–17.7)
Immature Grans (Abs): 0 10*3/uL (ref 0.0–0.1)
Immature Granulocytes: 0 %
Lymphocytes Absolute: 1.5 10*3/uL (ref 0.7–3.1)
Lymphs: 27 %
MCH: 32 pg (ref 26.6–33.0)
MCHC: 34.2 g/dL (ref 31.5–35.7)
MCV: 94 fL (ref 79–97)
Monocytes Absolute: 0.5 10*3/uL (ref 0.1–0.9)
Monocytes: 8 %
Neutrophils Absolute: 3.6 10*3/uL (ref 1.4–7.0)
Neutrophils: 62 %
Platelets: 246 10*3/uL (ref 150–450)
RBC: 4.62 x10E6/uL (ref 4.14–5.80)
RDW: 11.9 % (ref 11.6–15.4)
WBC: 5.8 10*3/uL (ref 3.4–10.8)

## 2021-10-11 LAB — COMPREHENSIVE METABOLIC PANEL
ALT: 19 IU/L (ref 0–44)
AST: 19 IU/L (ref 0–40)
Albumin/Globulin Ratio: 2 (ref 1.2–2.2)
Albumin: 4.4 g/dL (ref 3.8–4.8)
Alkaline Phosphatase: 52 IU/L (ref 44–121)
BUN/Creatinine Ratio: 11 (ref 10–24)
BUN: 10 mg/dL (ref 8–27)
Bilirubin Total: 0.5 mg/dL (ref 0.0–1.2)
CO2: 25 mmol/L (ref 20–29)
Calcium: 9.7 mg/dL (ref 8.6–10.2)
Chloride: 102 mmol/L (ref 96–106)
Creatinine, Ser: 0.92 mg/dL (ref 0.76–1.27)
Globulin, Total: 2.2 g/dL (ref 1.5–4.5)
Glucose: 70 mg/dL (ref 70–99)
Potassium: 4.4 mmol/L (ref 3.5–5.2)
Sodium: 142 mmol/L (ref 134–144)
Total Protein: 6.6 g/dL (ref 6.0–8.5)
eGFR: 92 mL/min/{1.73_m2} (ref >59)

## 2021-10-11 LAB — SEDIMENTATION RATE: Sed Rate: 2 mm/hr (ref 0–30)

## 2021-10-11 LAB — VITAMIN B12: Vitamin B-12: 390 pg/mL (ref 232–1245)

## 2021-10-11 LAB — TSH: TSH: 0.781 u[IU]/mL (ref 0.450–4.500)

## 2021-10-11 LAB — ANA W/REFLEX IF POSITIVE: Anti Nuclear Antibody (ANA): NEGATIVE

## 2021-10-11 LAB — HGB A1C W/O EAG: Hgb A1c MFr Bld: 5.8 % — ABNORMAL HIGH (ref 4.8–5.6)

## 2021-10-11 LAB — RPR: RPR Ser Ql: NONREACTIVE

## 2021-10-11 LAB — C-REACTIVE PROTEIN: CRP: <1 mg/L (ref 0–10)

## 2021-10-11 NOTE — Telephone Encounter (Signed)
Pt said feeling light headiness. Goes away after lying down for 15 minutes with eyes closed. Would like a call from the nurse. ?

## 2021-10-11 NOTE — Telephone Encounter (Signed)
Reports feeling lightheaded this morning. Resolved after lying down for 15 minutes. He was evaluated in our office 10/10/21. Reports no new symptoms but a continuation of his concerns evaluated yesterday. He has not started the prescribed medication but will take it today. MRI brain pending 10/17/21. All labs are not back yet. Aware he will be contacted with the results. He would like this Dr. Krista Blue to be aware of the dizziness he experienced today. Some days, he may have 2-3 episodes.  ?____________________________________ ? ?Notes from w/ Dr. Krista Blue on 10/11/21: ? ?ASSESSMENT AND PLAN ?  ?[name] Sosa is a 67 y.o. male   ?Acute onset of left-sided weakness ?            Mild left arm and leg weakness, ?            Multiple vascular risk factors, most concerned about right hemisphere small vessel acute stroke ?            MRI of the brain ?  ?Frequent dizziness, headache, also angry outburst ?            Effexor xr 75 mg daily as preventive medications ?            Laboratory evaluation including ESR C-reactive protein for new onset headaches ?

## 2021-10-17 ENCOUNTER — Encounter (HOSPITAL_BASED_OUTPATIENT_CLINIC_OR_DEPARTMENT_OTHER): Payer: Self-pay | Admitting: Family

## 2021-10-17 ENCOUNTER — Ambulatory Visit
Admission: RE | Admit: 2021-10-17 | Discharge: 2021-10-17 | Disposition: A | Discharge status: Home / Self Care | Payer: Medicare Other | Source: Ambulatory Visit | Attending: Neurology | Admitting: Neurology

## 2021-10-17 DIAGNOSIS — R531 Weakness: Secondary | ICD-10-CM

## 2021-10-17 NOTE — Telephone Encounter (Signed)
Left message for patient to call and schedule Echocardiogram and Carotid doppler ordered by Laurann Montana, NP---will also mail letter requesting patient call ?

## 2021-10-18 ENCOUNTER — Telehealth: Payer: Self-pay | Admitting: Neurology

## 2021-10-18 NOTE — Telephone Encounter (Signed)
Pt asked it be noted that he would like a call with results to MRI just as soon as they are available due to his symptoms worsening and lasting longer. ?

## 2021-10-19 NOTE — Telephone Encounter (Addendum)
The patient came by our office today inquiring about his MRI brain results. Scan completed on 10/17/21. He would like a call back today from MD. Symptoms worsening. Results still pending.  ?

## 2021-10-20 ENCOUNTER — Telehealth: Payer: Self-pay | Admitting: Family Medicine

## 2021-10-20 ENCOUNTER — Telehealth: Payer: Self-pay | Admitting: Neurology

## 2021-10-20 DIAGNOSIS — R42 Dizziness and giddiness: Secondary | ICD-10-CM

## 2021-10-20 NOTE — Telephone Encounter (Signed)
Patient called after-hours call service with a complaint of dizziness.  I was able to connect with the patient on the phone.  He reported ongoing issues with dizziness.  Helps to lie down, denies any actual spinning sensation or new symptoms, no sudden escalation of symptoms, ongoing symptoms from several weeks ago.  When asked him what made him call today, he stated that he had ongoing issues for the past several weeks.  I advised patient to proceed to the ER if he has sudden worsening of symptoms or new symptoms.  He stated that he would probably not go to the emergency room as they probably would not help him or do anything new that has not been done already.  He stated that he had an MRI which did not show anything.  I did not have his chart open at the time but in chart review, he had a recent brain MRI which did not show any acute findings.  Patient had called at the end of April also with ongoing symptoms of lightheadedness/dizziness.  He was advised to have someone take him to the emergency room if his symptoms got worse and that I would let Dr. Terrace Arabia know.  He is encouraged to call back during the workweek if he has any additional questions or concerns.  He demonstrated understanding and voiced agreement. ?

## 2021-10-20 NOTE — Telephone Encounter (Signed)
Pt requesting due to his last MRI he would like a referral to someone who can treat him for vertigo ?

## 2021-10-22 NOTE — Telephone Encounter (Signed)
Referral has been made to vestibular rehab ?

## 2021-10-23 MED ORDER — VENLAFAXINE HCL ER 150 MG PO CP24: 150.0000 mg | ORAL_CAPSULE | Freq: Every day | ORAL | 3 refills | Status: DC

## 2021-10-23 NOTE — Telephone Encounter (Signed)
Called pharmacy and voided refills of the 75mg  capsules on file. I spoke to the patient. He is agreeable to the increased dosage of venlafaxine. ?

## 2021-10-23 NOTE — Addendum Note (Signed)
Addended by: Marcial Pacas on: 10/23/2021 03:52 PM ? ? Modules accepted: Orders ? ?

## 2021-10-23 NOTE — Telephone Encounter (Signed)
Please call patient, MRI of the brain on Oct 19, 2021 showed no acute abnormalities. ? ?Extensive laboratory evaluations showed mild elevated A1c 5.8, otherwise were normal ? ?He called on-call physician complains of dizziness, please verify what kind of dizziness is it vertical just lightheadedness ? ?Echocardiogram and ultrasound of carotid artery was ordered on September 26, 2021, please check on the status, make sure they are complete ? ? ?I started him on Effexor XR 75 mg daily since last visit October 10, 2021, please check with him if he is tolerating medications ? ?If he tolerated Effexor, may give him higher dose ?

## 2021-10-23 NOTE — Telephone Encounter (Signed)
I called the patient. He continues to have intermittent dizziness, not positional, occasionally feels like he is spinning. More often, feels he is slowly swaying from side-to-side with weakness in legs, more of a lightheaded sensation. Random events with no prior awareness that it will happen. Symptoms last anywhere from 5 to 30 minutes. He feels better after laying down to rest. He has postponed the ECHO and carotid ultrasound. Discussed the importance of the tests. He will call today to schedule them. ? ?He is tolerating venlafaxine XR 75mg , one capsule daily. He did feel it was causing insomnia when taken at bedtime and has moved dose to morning. He is agreeable to try an increase in the medication. If new rx is sent, he would like a 90-day supply. ?

## 2021-10-23 NOTE — Telephone Encounter (Signed)
Noted  

## 2021-10-23 NOTE — Addendum Note (Signed)
Addended by: Noberto Retort C on: 10/23/2021 04:46 PM ? ? Modules accepted: Orders ? ?

## 2021-10-23 NOTE — Telephone Encounter (Signed)
Meds ordered this encounter  ?Medications  ? venlafaxine XR (EFFEXOR XR) 150 MG 24 hr capsule  ?  Sig: Take 1 capsule (150 mg total) by mouth daily with breakfast.  ?  Dispense:  90 capsule  ?  Refill:  3  ?   ?

## 2021-11-01 NOTE — Therapy (Signed)
OUTPATIENT PHYSICAL THERAPY VESTIBULAR EVALUATION     Patient Name: [name] Sosa MRN: [id number] DOB:Jan 18, 1956, 66 y.o., male Today's Date: 11/03/2021  PCP: [name], MD  REFERRING PROVIDER: [name], MD    PT End of Session - 11/03/21 1151     Visit Number 1    Number of Visits 1    Date for PT Re-Evaluation 11/03/21    Authorization Type [employer] Medicare    PT Start Time 1105    PT Stop Time 1149    PT Time Calculation (min) 44 min    Activity Tolerance Patient tolerated treatment well    Behavior During Therapy WFL for tasks assessed/performed             Past Medical History:  Diagnosis Date   CAD (coronary artery disease)    s/p 7 vessel CABG 2000 at [facility] // Echo 11/2018: EF 45-50, antero-apical and inferior akinesis // Myoview 11/2018: EF 51, distal ant/apical infarct and small inferior infarct with very mild peri-infarct ischemia; Low Risk   Carotid artery occlusion    High cholesterol    Hyperlipidemia    Hypertension    Pre-diabetes    < 6.1 2017   Stroke ([place]) 04/2016   TIA- 04/2016 double vision 2nd - speech - no residual   Trochlear neuropathy, 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   Past Surgical History:  Procedure Laterality Date   4v cabg     CARDIAC CATHETERIZATION     CARDIAC CATHETERIZATION N/A 03/23/2016   Procedure: Left Heart Cath and Cors/Grafts Angiography;  Surgeon: Burnell Blanks, MD;  Location: [place] [facility];  Service: Cardiovascular;  Laterality: N/A;   CAROTID ANGIOGRAPHY N/A 10/11/2016   Procedure: Carotid Angiography / Cerebral angiogram;  Surgeon: Conrad Mount Calm, MD;  Location: [place] [facility];  Service: Cardiovascular;  Laterality: N/A;   CAROTID ENDARTERECTOMY     COLONOSCOPY  2017   COLONOSCOPY W/ POLYPECTOMY     CORONARY ARTERY BYPASS GRAFT     ENDARTERECTOMY Left 10/16/2016   Procedure: LEFT CAROTID ENDARTERECTOMY WITH PATCH ANGIOPLASTY;  Surgeon: Conrad Natalbany, MD;  Location: [facility];  Service: Vascular;  Laterality:  Left;   LEFT HEART CATH AND CORS/GRAFTS ANGIOGRAPHY N/A 01/19/2021   Procedure: LEFT HEART CATH AND CORS/GRAFTS ANGIOGRAPHY;  Surgeon: Burnell Blanks, MD;  Location: [place] [facility];  Service: Cardiovascular;  Laterality: N/A;   patent grafts     s/p 7     UPPER GASTROINTESTINAL ENDOSCOPY     Patient Active Problem List   Diagnosis Date Noted   Pre-diabetes 10/10/2021   Left-sided weakness 10/10/2021   Coronary artery disease involving native coronary artery of native heart with unstable angina pectoris ([place])    History of left-sided carotid endarterectomy 02/29/2020   Bilateral carpal tunnel syndrome 09/03/2019   [place]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 09/03/2019   Spinal stenosis of cervical region 09/03/2019   Ulnar neuropathy at elbow, right 09/03/2019   Weakness of right upper extremity 09/03/2019   Recurrent right inguinal hernia 06/17/2019   Incomplete rotator cuff tear or rupture of right shoulder, not specified as traumatic 03/47/4259   Chronic systolic CHF (congestive heart failure) ([place]) 11/25/2018   Visit for occupational health examination 02/27/2018   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 07/05/2017   Left carotid artery stenosis 10/16/2016   Carotid artery disease ([place]) 10/05/2016   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   Diplopia 05/03/2016   Trochlear neuropathy, 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 Also  reports HA that occurs before or after dizziness. Episodes last minutes and are described as lightheadedness. Worse when getting out of a car, getting up quickly. Reports orthostatics have been checked at PCP office and were negative. Feels better when laying down. When closing eyes he feels like things are spinning but not with his eyes open. Denies head trauma, infection/illness, vision changes/double vision, hearing loss,  otalgia, photo/phonophobia, migraines. Notes occasional tinnitus. Episodes occurs multiple times a week, nearly daily.   Pt accompanied by: self  PERTINENT HISTORY: CAD, carotid artery occlusion, HLD, HTN, pre-DM, TIA 2017, R trochlear neuropathy, CABG   PAIN:  Are you having pain? No  PRECAUTIONS: Fall  WEIGHT BEARING RESTRICTIONS No  FALLS: Has patient fallen in last 6 months? No  LIVING ENVIRONMENT: Lives with: lives with their family Lives in: House/apartment Stairs: No Has following equipment at home: None  PLOF: Independent and retired  PATIENT GOALS "figure out if I have vertigo"  OBJECTIVE:   DIAGNOSTIC FINDINGS: 10/17/21 brain MRI: Stable right frontal scalp metallic artifact. Stable, mild periventricular subcortical foci of nonspecific gliosis. No acute findings.  COGNITION: Overall cognitive status: Within functional limits for tasks assessed   SENSATION: WFL  POSTURE: rounded shoulders and forward head   Cervical ROM:   *denies pain or dizziness Active A/PROM (deg) 11/03/2021  Flexion 57  Extension 43  Right lateral flexion 34  Left lateral flexion 37  Right rotation 41  Left rotation 42  (Blank rows = not tested)  GAIT: Gait pattern: WFL    VESTIBULAR ASSESSMENT   GENERAL OBSERVATION: patient wearing readers     OCULOMOTOR EXAM:   Ocular Alignment: normal   Ocular ROM: No Limitations   Spontaneous Nystagmus: absent   Gaze-Induced Nystagmus: absent   Smooth Pursuits: intact   Saccades: intact and c/o  dizziness   Convergence/Divergence: intact    VESTIBULAR - OCULAR REFLEX:    Slow VOR: Normal   VOR Cancellation: Normal; c/o slight dizziness   Head-Impulse Test: difficult to assess d/t muscle guarding     POSITIONAL TESTING:  Right Roll Test: negative Left Roll Test: negative; Right Dix-Hallpike: negative Left Dix-Hallpike: negative    M-CTSIB  Condition 1: Firm Surface, EO 30 Sec, Normal Sway  Condition 2: Firm Surface, EC 30 Sec, Mild Sway to L  Condition 3: Foam Surface, EO 30 Sec, Mild Sway  Condition 4: Foam Surface, EC 30 Sec, Moderate Sway      VESTIBULAR TREATMENT:   PATIENT EDUCATION: Education details: edu on exam findings and encouraged patient to f/u with MD to continued workup; HEP Person educated: Patient Education method: Explanation, Demonstration, and Handouts Education comprehension: verbalized understanding and returned demonstration   GOALS: Goals reviewed with patient? Yes  SHORT TERM GOALS: Target date: 11/03/21  Patient to be independent with initial HEP. Baseline: HEP initiated Goal status: MET    LONG TERM GOALS: Target date:  11/03/21  Patient to be independent with initial HEP. Baseline: HEP initiated Goal status: MET  ASSESSMENT:  CLINICAL IMPRESSION:   Patient is a 66 y/o M presenting to OPPT with c/o dizziness and HA since 10/17/21. Episodes are described as lightheaded and worse when getting out of a car, getting up quickly. Per patient's report- MRI and orthostatics were negative. Denies head trauma, infection/illness, vision changes/double vision, hearing loss, otalgia, photo/phonophobia, migraines. Notes occasional tinnitus.   Oculomotor exam revealed very mild dizziness with saccades and VOR cancellation. Positional testing was normal. Balance testing revealed slightly increased trouble using vestibular system for balance. Unable to truly replicate symptoms today, thus provided HEP with edu on how to safely complete it  and encouraged continued f/u with MD as patient's symptoms don't appear vestibular in nature. Placing patient on 30 day hold at this time in case symptoms return.    OBJECTIVE IMPAIRMENTS decreased balance and dizziness.   ACTIVITY LIMITATIONS meal prep, cleaning, laundry, driving, shopping, community activity, yard work, and church.   PERSONAL FACTORS Age, Past/current experiences, Time since onset of injury/illness/exacerbation, and 3+ comorbidities: CAD, carotid artery occlusion, HLD, HTN, pre-DM, TIA 2017, R trochlear neuropathy, CABG  are also affecting patient's functional outcome.    REHAB POTENTIAL: Fair    CLINICAL DECISION MAKING: Unstable/unpredictable  EVALUATION COMPLEXITY: Moderate   PLAN: PT FREQUENCY: one time visit    PLANNED INTERVENTIONS: Therapeutic exercises, Therapeutic activity, Neuromuscular re-education, Balance training, Gait training, Patient/Family education, Joint mobilization, Vestibular training, Canalith repositioning, Dry Needling, Electrical stimulation, Cryotherapy, Moist heat, Manual therapy, and Re-evaluation  PLAN FOR NEXT SESSION: 30 day hold at this time   Janene Harvey, PT, DPT 11/03/21 12:04 PM

## 2021-11-02 ENCOUNTER — Ambulatory Visit (INDEPENDENT_AMBULATORY_CARE_PROVIDER_SITE_OTHER): Payer: Medicare Other

## 2021-11-02 ENCOUNTER — Encounter (HOSPITAL_BASED_OUTPATIENT_CLINIC_OR_DEPARTMENT_OTHER): Payer: Self-pay

## 2021-11-02 DIAGNOSIS — R42 Dizziness and giddiness: Secondary | ICD-10-CM

## 2021-11-02 LAB — ECHOCARDIOGRAM COMPLETE
AR max vel: 1.33 cm2
AV Area VTI: 1.37 cm2
AV Area mean vel: 1.17 cm2
AV Mean grad: 4 mmHg
AV Peak grad: 7.2 mmHg
Ao pk vel: 1.34 m/s
Area-P 1/2: 2.8 cm2
Calc EF: 57.3 %
S' Lateral: 3.05 cm
Single Plane A2C EF: 57 %
Single Plane A4C EF: 56.3 %

## 2021-11-03 ENCOUNTER — Ambulatory Visit: Payer: Medicare Other | Attending: Family Medicine | Admitting: Physical Therapy

## 2021-11-03 ENCOUNTER — Encounter: Payer: Self-pay | Admitting: Physical Therapy

## 2021-11-03 DIAGNOSIS — R2681 Unsteadiness on feet: Secondary | ICD-10-CM | POA: Insufficient documentation

## 2021-11-03 DIAGNOSIS — R42 Dizziness and giddiness: Secondary | ICD-10-CM | POA: Insufficient documentation

## 2021-11-06 ENCOUNTER — Telehealth: Payer: Self-pay

## 2021-11-06 NOTE — Telephone Encounter (Signed)
Patient left a voicemail with our office requesting test results and the subsequent treatment plan. He can be reached at [phone].

## 2021-11-06 NOTE — Telephone Encounter (Signed)
Please call patient, ultrasound of carotid artery showed less than 50% stenosis of right internal carotid artery, less than 40% on the left side, status post left carotid endarterectomy, bilateral vertebral artery antegrade flow with  Echocardiogram showed no significant abnormality  Please encourage him continue follow-up with cardiologist,  If he remains symptomatic, encouraged him to keep neurology follow-up, to draw a conclusion about neurology work-up      Right Carotid: Velocities in the right ICA have increased and are consistent with a high end range 40-59% stenosis. Non-hemodynamically significant plaque <50% noted in the CCA. The ECA appears >50% stenosed. Left Carotid: Velocities in the left ICA are consistent with a stable 1-39% stenosis, s/p CEA. Vertebrals: Bilateral vertebral arteries demonstrate antegrade flow. Subclavians: Normal flow hemodynamics were seen in bilateral subclavian arteries.

## 2021-11-06 NOTE — Telephone Encounter (Signed)
I spoke to the patient. He is agreeable to the plan below.

## 2021-11-06 NOTE — Telephone Encounter (Signed)
I spoke to the patient. No change in symptoms since his recent increase in venlafaxine XR 150mg , one cap QHS.  Previously described. He continues to have intermittent dizziness, not positional, occasionally feels like he is spinning. More often, feels he is slowly swaying from side-to-side with weakness in legs, more of a lightheaded sensation. Random events with no prior awareness that it will happen. Symptoms last anywhere from 5 to 30 minutes. He feels better after laying down to rest.  Negative MRI brain and orthostatics.  PT evaluation 11/03/21 with no planned return.  ECHO and carotid 11/05/21 completed 11/02/21.  Pending cardiology appt 11/15/21. ____________________________________ He is asking for Dr. 11/17/21 to review the PT assessment and cardiology testing. He is unsure the next step in plan. He wants to know if continued neurology follow up is needed (currently has app w/ [facility] 12/14/21). Or if Dr. 12/16/21 feels his symptoms have a cardiology etiology.

## 2021-11-15 ENCOUNTER — Encounter: Payer: Self-pay | Admitting: Cardiovascular Disease

## 2021-11-15 ENCOUNTER — Ambulatory Visit (INDEPENDENT_AMBULATORY_CARE_PROVIDER_SITE_OTHER): Payer: Medicare Other

## 2021-11-15 ENCOUNTER — Encounter: Payer: Self-pay | Admitting: Family Medicine

## 2021-11-15 ENCOUNTER — Ambulatory Visit: Payer: Medicare Other | Admitting: Cardiovascular Disease

## 2021-11-15 VITALS — BP 108/62 | HR 62 | Ht 68.0 in | Wt 134.8 lb

## 2021-11-15 DIAGNOSIS — I6523 Occlusion and stenosis of bilateral carotid arteries: Secondary | ICD-10-CM

## 2021-11-15 DIAGNOSIS — E785 Hyperlipidemia, unspecified: Secondary | ICD-10-CM | POA: Diagnosis not present

## 2021-11-15 DIAGNOSIS — I10 Essential (primary) hypertension: Secondary | ICD-10-CM | POA: Diagnosis not present

## 2021-11-15 DIAGNOSIS — I25709 Atherosclerosis of coronary artery bypass graft(s), unspecified, with unspecified angina pectoris: Secondary | ICD-10-CM | POA: Diagnosis not present

## 2021-11-15 DIAGNOSIS — R42 Dizziness and giddiness: Secondary | ICD-10-CM

## 2021-11-15 MED ORDER — NITROGLYCERIN 0.4 MG SL SUBL: 0.4000 mg | SUBLINGUAL_TABLET | SUBLINGUAL | 3 refills | Status: DC | PRN

## 2021-11-15 NOTE — Progress Notes (Unsigned)
Applied a 3 day Zio XT monitor to patient in the office 

## 2021-11-15 NOTE — Patient Instructions (Signed)
Medication Instructions:  Your physician recommends that you continue on your current medications as directed. Please refer to the Current Medication list given to you today.  *If you need a refill on your cardiac medications before your next appointment, please call your pharmacy*   Lab Work: None. If you have labs (blood work) drawn today and your tests are completely normal, you will receive your results only by: [place] (if you have MyChart) OR A paper copy in the mail If you have any lab test that is abnormal or we need to change your treatment, we will call you to review the results.   Testing/Procedures: 3 Day Zio heart monitor.    Follow-Up: At [facility], you and your health needs are our priority.  As part of our continuing mission to provide you with exceptional heart care, we have created designated Provider Care Teams.  These Care Teams include your primary Cardiologist (physician) and Advanced Practice Providers (APPs -  Physician Assistants and Nurse Practitioners) who all work together to provide you with the care you need, when you need it.  We recommend signing up for the patient portal called "MyChart".  Sign up information is provided on this After Visit Summary.  MyChart is used to connect with patients for Virtual Visits (Telemedicine).  Patients are able to view lab/test results, encounter notes, upcoming appointments, etc.  Non-urgent messages can be sent to your provider as well.   To learn more about what you can do with MyChart, go to [email or web address].    Your next appointment:   12 month(s)  The format for your next appointment:   In Person  Provider:   [name], MD {  Important Information About Sugar

## 2021-11-15 NOTE — Progress Notes (Signed)
Chief Complaint  Patient presents with   Follow-up    CAD    History of Present Illness: 66 yo male with h/o CAD s/p 7V CABG in 2000 at [facility], HTN, hyperlipidemia, carotid artery disease and prior TIA here today for cardiac follow up. He was seen as a new pt in January 2011 with complaints of chest pain. He had been followed from 2000 until 2011 by Dr. [name] in [place]. He has had frequent episodes of chest pain since I met him in 2011. Cardiac cath in 2011 with 5/7 patent bypass grafts. Stress myoview in August 2015 with no ischemia, LVEF=48%. Cardiac cath October 2017 with patent LIMA to LAD, patent SVG to OM which filled all of the Circumflex and occluded RCA with occluded SVG to RCA. The RCA filled from left to right collaterals. Nuclear stress test June 2020 with evidence of prior infarct but overall low risk. Echo June 2020 with LVEF=45-50% with segmental wall motion abnormality. No valve disease. He was seen in the office July 2022 and reported worsened chest pains. Cardiac cath 01/19/21 with 2/5 patent grafts (LIMA to LAD, free RIMA to Circumflex). The RCA is occluded and fills from left to right collaterals. LVEF=40-45%. Echo May 2023 with LVEF=55-60% with akinesis of the basal inferior wall, mild mitral regurgitation. He underwent left carotid endarterectomy in May 2018 following a TIA. His carotid disease is followed in vascular surgery at [facility]. Carotid dopplers 11/02/20 with 40-59% RICA stenosis, 3-70% LICA stenosis. He has had dizziness and is seeing Neurology. Recent brain MRI with no acute abnormalities.   He is here today for follow up. The patient denies any chest pain, dyspnea, palpitations, lower extremity edema, orthopnea, PND. Dizziness as above.    Primary Care Physician: [name], MD  Past Medical History:  Diagnosis Date   CAD (coronary artery disease)    s/p 7 vessel CABG 2000 at [facility] // Echo 11/2018: EF 45-50, antero-apical and inferior akinesis // Myoview  11/2018: EF 51, distal ant/apical infarct and small inferior infarct with very mild peri-infarct ischemia; Low Risk   Carotid artery occlusion    High cholesterol    Hyperlipidemia    Hypertension    Pre-diabetes    < 6.1 2017   Stroke ([place]) 04/2016   TIA- 04/2016 double vision 2nd - speech - no residual   Trochlear neuropathy, 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    Past Surgical History:  Procedure Laterality Date   4v cabg     CARDIAC CATHETERIZATION     CARDIAC CATHETERIZATION N/A 03/23/2016   Procedure: Left Heart Cath and Cors/Grafts Angiography;  Surgeon: Burnell Blanks, MD;  Location: [place] [facility];  Service: Cardiovascular;  Laterality: N/A;   CAROTID ANGIOGRAPHY N/A 10/11/2016   Procedure: Carotid Angiography / Cerebral angiogram;  Surgeon: Conrad [place]ville, MD;  Location: [place] [facility];  Service: Cardiovascular;  Laterality: N/A;   CAROTID ENDARTERECTOMY     COLONOSCOPY  2017   COLONOSCOPY W/ POLYPECTOMY     CORONARY ARTERY BYPASS GRAFT     ENDARTERECTOMY Left 10/16/2016   Procedure: LEFT CAROTID ENDARTERECTOMY WITH PATCH ANGIOPLASTY;  Surgeon: Conrad Fountainebleau, MD;  Location: [place];  Service: Vascular;  Laterality: Left;   LEFT HEART CATH AND CORS/GRAFTS ANGIOGRAPHY N/A 01/19/2021   Procedure: LEFT HEART CATH AND CORS/GRAFTS ANGIOGRAPHY;  Surgeon: Burnell Blanks, MD;  Location: [place] [facility];  Service: Cardiovascular;  Laterality: N/A;   patent grafts     s/p 7  UPPER GASTROINTESTINAL ENDOSCOPY      Current Outpatient Medications  Medication Sig Dispense Refill   carvedilol (COREG) 3.125 MG tablet Take 1 tablet (3.125 mg total) by mouth 2 (two) times daily with a meal. 180 tablet 2   clopidogrel (PLAVIX) 75 MG tablet Take 1 tablet (75 mg total) by mouth daily. 30 tablet 11   lisinopril (ZESTRIL) 10 MG tablet Take 1 tablet by mouth once daily 90 tablet 2   ranolazine (RANEXA) 500 MG 12 hr tablet Take 1 tablet by mouth twice daily 180 tablet 2   rosuvastatin (CRESTOR)  20 MG tablet Take 1 tablet by mouth once daily 90 tablet 3   venlafaxine XR (EFFEXOR XR) 150 MG 24 hr capsule Take 1 capsule (150 mg total) by mouth daily with breakfast. 90 capsule 3   nitroGLYCERIN (NITROSTAT) 0.4 MG SL tablet Place 1 tablet (0.4 mg total) under the tongue every 5 (five) minutes as needed for chest pain (up to 3 doses). 25 tablet 3   No current facility-administered medications for this visit.    Allergies  Allergen Reactions   Amlodipine     Chest pain    Lisinopril Cough    Social History   Socioeconomic History   Marital status: Single    Spouse name: Not on file   Number of children: 3   Years of education: AS + 2   Highest education level: Not on file  Occupational History   Occupation: [employer]  Tobacco Use   Smoking status: Former    Years: 2.00    Types: Cigarettes    Quit date: 06/19/1998    Years since quitting: 23.4   Smokeless tobacco: Never  Vaping Use   Vaping Use: Never used  Substance and Sexual Activity   Alcohol use: No   Drug use: No   Sexual activity: Yes  Other Topics Concern   Not on file  Social History Narrative   Lives at home alone   Right-handed   Caffeine: 2 cups per day   Social Determinants of Health   Financial Resource Strain: Not on file  Food Insecurity: Not on file  Transportation Needs: Not on file  Physical Activity: Not on file  Stress: Not on file  Social Connections: Not on file  Intimate Partner Violence: Not on file    Family History  Problem Relation Age of Onset   Heart failure Mother    Hypertension Mother    Heart disease Mother    Heart failure Father    Hypertension Father    Heart disease Father        before age 3   Hypertension Sister    Hypertension Brother    Heart attack Neg Hx    Stroke Neg Hx     Review of Systems:  As stated in the HPI and otherwise negative.   BP 108/62   Pulse 62   Ht _0  (1.727 m)   Wt 134 lb 12.8 oz (61.1 kg)   SpO2 99%   BMI 20.50 kg/m    Physical Examination: General: Well developed, well nourished, NAD  HEENT: OP clear, mucus membranes moist  SKIN: warm, dry. No rashes. Neuro: No focal deficits  Musculoskeletal: Muscle strength 5/5 all ext  Psychiatric: Mood and affect normal  Neck: No JVD, no carotid bruits, no thyromegaly, no lymphadenopathy.  Lungs:Clear bilaterally, no wheezes, rhonci, crackles Cardiovascular: Regular rate and rhythm. No murmurs, gallops or rubs. Abdomen:Soft. Bowel sounds present. Non-tender.  Extremities: No lower  extremity edema. Pulses are 2 + in the bilateral DP/PT.  Echo May 2023:  1. Akinesis of the basal inferior wall with overall normal LV function.   2. Left ventricular ejection fraction, by estimation, is 55 to 60%. The  left ventricle has normal function. The left ventricle demonstrates  regional wall motion abnormalities (see scoring diagram/findings for  description). Left ventricular diastolic  parameters are consistent with Grade I diastolic dysfunction (impaired  relaxation).   3. Right ventricular systolic function is normal. The right ventricular  size is normal.   4. Left atrial size was mildly dilated.   5. The mitral valve is normal in structure. Mild mitral valve  regurgitation. No evidence of mitral stenosis. There is mild late systolic  prolapse of the medial segment of the anterior leaflet of the mitral  valve.   6. The aortic valve is tricuspid. Aortic valve regurgitation is not  visualized. Aortic valve sclerosis is present, with no evidence of aortic  valve stenosis.   7. Aortic dilatation noted. There is borderline dilatation of the aortic  root, measuring 38 mm.   8. The inferior vena cava is normal in size with greater than 50%  respiratory variability, suggesting right atrial pressure of 3 mmHg.   EKG:  EKG is  ordered today. The EKG demonstrates sinus, old inferior MI.   Recent Labs: 10/10/2021: ALT 19; BUN 10; Creatinine, Ser 0.92; Hemoglobin 14.8;  Platelets 246; Potassium 4.4; Sodium 142; TSH 0.781   Lipid Panel    Component Value Date/Time   CHOL 125 10/04/2020 1658   CHOL 137 01/29/2019 0818   TRIG 170.0 (H) 10/04/2020 1658   HDL 49.70 10/04/2020 1658   HDL 50 01/29/2019 0818   CHOLHDL 3 10/04/2020 1658   VLDL 34.0 10/04/2020 1658   LDLCALC 42 10/04/2020 1658   LDLCALC 67 01/04/2020 1553     Wt Readings from Last 3 Encounters:  11/15/21 134 lb 12.8 oz (61.1 kg)  10/10/21 134 lb (60.8 kg)  10/03/21 135 lb 6.4 oz (61.4 kg)     Other studies Reviewed: Additional studies/ records that were reviewed today include: . Review of the above records demonstrates:    Assessment and Plan:   1. CAD s/p CABG with unstable angina: No change in chronic chest pains. CAD stable by cardiac cath in 2022. Cardiac cath 01/19/21 with 2/5 patent grafts, collateral filling of the occluded RCA. Continue Plavix, Coreg, Ranexa and Crestor. He did not tolerate Norvasc.   2. HTN: BP is well controlled. No changes today  3. HLD: LDL 42 in 2022. Continue statin.    4. Carotid artery disease: s/p left CEA in 2018. Followed in V[facility] surgery [facility].  Carotid artery dopplers stable may 2023.   5. Dizziness: His symptoms sound like vertigo. His LV function is normal by echo this month. No significant valve disease. EKG with sinus rhythm. Will arrange a 3 day Zio monitor to exclude heart block/arrhythmias.   Current medicines are reviewed at length with the patient today.  The patient does not have concerns regarding medicines.  The following changes have been made:  no change  Labs/ tests ordered today include:   Orders Placed This Encounter  Procedures   LONG TERM MONITOR (3-14 DAYS)   EKG 12-Lead    Disposition:   F/U with me in one year.   Signed, [name], MD 11/15/2021 4:58 PM    [facility] [place], [place], [place]  [address] Phone: [phone])  [phone]; Fax: [phone]

## 2021-11-16 ENCOUNTER — Encounter: Payer: Self-pay | Admitting: Cardiovascular Disease

## 2021-11-17 MED ORDER — ASPIRIN 81 MG PO TBEC
81.0000 mg | DELAYED_RELEASE_TABLET | Freq: Every day | ORAL | 3 refills | Status: AC
Start: 2021-11-17 — End: ?

## 2021-11-17 NOTE — Telephone Encounter (Signed)
Updated medication list and called patient to let him know.  Left detailed message on VM per DPR.

## 2021-11-21 ENCOUNTER — Telehealth: Payer: Self-pay | Admitting: Cardiovascular Disease

## 2021-11-21 NOTE — Telephone Encounter (Signed)
Patient calling in to speak with the nurse. Please advise

## 2021-11-21 NOTE — Telephone Encounter (Signed)
Patient called to report an episode of light headedness and dizziness,  following the 72 hours he had monitor on. Denied CP, N/V and no palpitations. Patient states when he has these episodes he needs to sit or lie down until it passes. Can last from a few minutes to 30 minutes. Patient reports no episodes during the 72 hours he was wearing the monitor. He wanted to make Dr. [name] aware.

## 2021-11-22 NOTE — Telephone Encounter (Signed)
Reply from Dr. [name]: If there is nothing on the monitor that is pending we may need to do a longer monitor.

## 2021-11-24 DIAGNOSIS — R42 Dizziness and giddiness: Secondary | ICD-10-CM | POA: Diagnosis not present

## 2021-11-24 DIAGNOSIS — G43109 Migraine with aura, not intractable, without status migrainosus: Secondary | ICD-10-CM | POA: Diagnosis not present

## 2021-11-27 DIAGNOSIS — R519 Headache, unspecified: Secondary | ICD-10-CM | POA: Diagnosis not present

## 2021-11-27 DIAGNOSIS — Z79891 Long term (current) use of opiate analgesic: Secondary | ICD-10-CM | POA: Diagnosis not present

## 2021-11-27 DIAGNOSIS — M4802 Spinal stenosis, cervical region: Secondary | ICD-10-CM | POA: Diagnosis not present

## 2021-11-27 DIAGNOSIS — S20411A Abrasion of right back wall of thorax, initial encounter: Secondary | ICD-10-CM | POA: Diagnosis not present

## 2021-11-27 DIAGNOSIS — Z79899 Other long term (current) drug therapy: Secondary | ICD-10-CM | POA: Diagnosis not present

## 2021-11-27 DIAGNOSIS — I25119 Atherosclerotic heart disease of native coronary artery with unspecified angina pectoris: Secondary | ICD-10-CM | POA: Diagnosis not present

## 2021-11-27 DIAGNOSIS — E785 Hyperlipidemia, unspecified: Secondary | ICD-10-CM | POA: Diagnosis not present

## 2021-11-27 DIAGNOSIS — R110 Nausea: Secondary | ICD-10-CM | POA: Diagnosis not present

## 2021-11-27 DIAGNOSIS — I509 Heart failure, unspecified: Secondary | ICD-10-CM | POA: Diagnosis not present

## 2021-11-27 DIAGNOSIS — I110 Hypertensive heart disease with heart failure: Secondary | ICD-10-CM | POA: Diagnosis not present

## 2021-11-27 DIAGNOSIS — S2241XA Multiple fractures of ribs, right side, initial encounter for closed fracture: Secondary | ICD-10-CM | POA: Diagnosis not present

## 2021-11-27 DIAGNOSIS — M6281 Muscle weakness (generalized): Secondary | ICD-10-CM | POA: Diagnosis not present

## 2021-11-27 DIAGNOSIS — J90 Pleural effusion, not elsewhere classified: Secondary | ICD-10-CM | POA: Diagnosis not present

## 2021-11-27 DIAGNOSIS — R9431 Abnormal electrocardiogram [ECG] [EKG]: Secondary | ICD-10-CM | POA: Diagnosis not present

## 2021-11-27 DIAGNOSIS — R0781 Pleurodynia: Secondary | ICD-10-CM | POA: Diagnosis not present

## 2021-11-27 DIAGNOSIS — R42 Dizziness and giddiness: Secondary | ICD-10-CM | POA: Diagnosis not present

## 2021-11-27 DIAGNOSIS — Z888 Allergy status to other drugs, medicaments and biological substances status: Secondary | ICD-10-CM | POA: Diagnosis not present

## 2021-11-27 DIAGNOSIS — Z7982 Long term (current) use of aspirin: Secondary | ICD-10-CM | POA: Diagnosis not present

## 2021-11-28 ENCOUNTER — Telehealth: Payer: Self-pay

## 2021-11-28 ENCOUNTER — Telehealth (INDEPENDENT_AMBULATORY_CARE_PROVIDER_SITE_OTHER): Payer: Medicare Other | Admitting: Family Medicine

## 2021-11-28 DIAGNOSIS — R059 Cough, unspecified: Secondary | ICD-10-CM

## 2021-11-28 DIAGNOSIS — S2241XD Multiple fractures of ribs, right side, subsequent encounter for fracture with routine healing: Secondary | ICD-10-CM

## 2021-11-28 MED ORDER — BENZONATATE 100 MG PO CAPS: 100.0000 mg | ORAL_CAPSULE | Freq: Three times a day (TID) | ORAL | 0 refills | Status: DC | PRN

## 2021-11-28 NOTE — Progress Notes (Signed)
Patient ID: [name] Sosa, male   DOB: 1956/05/24, 66 y.o.   MRN: [id number]   Virtual Visit via Telephone Note  I connected with [name] Sosa on 11/28/21 at  2:15 PM EDT by telephone and verified that I am speaking with the correct person using two identifiers.   I discussed the limitations, risks, security and privacy concerns of performing an evaluation and management service by telephone and the availability of in person appointments. I also discussed with the patient that there may be a patient responsible charge related to this service. The patient expressed understanding and agreed to proceed.  Location patient: home Location provider: work or home office Participants present for the call: patient, provider Patient did not have a visit in the prior 7 days to address this/these issue(s).   History of Present Illness:  Recent fall with multiple right-sided rib fractures.  Patient has multiple chronic problems including history of peripheral vascular disease, TIA, CAD, spinal stenosis cervical region who presents the ED with rib pain following a fall at home which occurred about 6 days ago.  He had gotten up in the middle night to go the bathroom and had some dizziness and fell on the right side.  X-rays showed fractures right fifth through 8 posterior ribs.  He has some chronic cough which has been very difficult to tolerate in the setting of rib fractures.  Tried multiple over-the-counter cough medicines without relief.  Cough is dry.  No fever.  Rib pain is 1-2 out of 10 at rest but 10 with movement and coughing.  He would like to consider other cough suppressant.  He was prescribed oxycodone but hesitates to take pain medicine in the opioid family.  He is trying to manage his pain with ibuprofen and Tylenol  He has had some chronic vestibular problems has had extensive work-up.  Had seen neurologist here locally and had MRI brain which showed no acute findings.  He has subsequently seen  someone at [facility] and question of vestibular seizure raised.  He has pending EEG.  Prescribe Ubrelvy for possible migraine headache.  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   Past Surgical History:  Procedure Laterality Date   4v cabg     CARDIAC CATHETERIZATION     CARDIAC CATHETERIZATION N/A 03/23/2016   Procedure: Left Heart Cath and Cors/Grafts Angiography;  Surgeon: Burnell Blanks, MD;  Location: [place] [facility];  Service: Cardiovascular;  Laterality: N/A;   CAROTID ANGIOGRAPHY N/A 10/11/2016   Procedure: Carotid Angiography / Cerebral angiogram;  Surgeon: Conrad Kenmare, MD;  Location: [place] [facility];  Service: Cardiovascular;  Laterality: N/A;   CAROTID ENDARTERECTOMY     COLONOSCOPY  2017   COLONOSCOPY W/ POLYPECTOMY     CORONARY ARTERY BYPASS GRAFT     ENDARTERECTOMY Left 10/16/2016   Procedure: LEFT CAROTID ENDARTERECTOMY WITH PATCH ANGIOPLASTY;  Surgeon: Conrad Orlinda, MD;  Location: [place];  Service: Vascular;  Laterality: Left;   LEFT HEART CATH AND CORS/GRAFTS ANGIOGRAPHY N/A 01/19/2021   Procedure: LEFT HEART CATH AND CORS/GRAFTS ANGIOGRAPHY;  Surgeon: Burnell Blanks, MD;  Location: [place] [facility];  Service: Cardiovascular;  Laterality: N/A;   patent grafts     s/p 7     UPPER GASTROINTESTINAL ENDOSCOPY      reports that he quit smoking about 23 years ago. His smoking use included cigarettes. He has never used smokeless tobacco. He reports that he does not drink alcohol and  does not use drugs. family history includes Heart disease in his father and mother; Heart failure in his father and mother; Hypertension in his brother, father, mother, and sister. Allergies  Allergen Reactions   Amlodipine     Chest pain    Lisinopril Cough      Observations/Objective: Patient sounds cheerful and well on the phone. I do not appreciate any SOB. Speech and thought processing are grossly intact. Patient reported vitals:  Assessment and Plan:  Recent fall at home with rib fractures right posterior fifth through 8 ribs.  Pain currently stable at rest.  He is trying to manage this with Tylenol and occasional ibuprofen.  -We did offer Tessalon Perles 100 mg every 8 hours as needed for cough.   -He has incentive spirometer and is encouraged to use that to prevent pneumonia -He has scheduled office follow-up next week to reassess.  Follow-up immediately for any fever, increased shortness of breath, or other concerns  Follow Up Instructions:   99441 5-10 99442 11-20 99443 21-30 I did not refer this patient for an OV in the next 24 hours for this/these issue(s).  I discussed the assessment and treatment plan with the patient. The patient was provided an opportunity to ask questions and all were answered. The patient agreed with the plan and demonstrated an understanding of the instructions.   The patient was advised to call back or seek an in-person evaluation if the symptoms worsen or if the condition fails to improve as anticipated.  I provided 18 minutes of non-face-to-face time during this encounter.   Carolann Littler, MD

## 2021-11-28 NOTE — Telephone Encounter (Signed)
---  Caller reports that he suffered fall last week and thinks he may have cracked his ribs as c/o having very sharp pains to abdomen side/ back radiating to chest. No head injury.  11/27/2021 9:47:59 AM Go to ED [address] Hacker, RN, [facility]  Referrals [facility] - ED  11/28/21 [phone] - Pt was seen in ED [facility] system) yesterday. Dx with 3 fractured ribs in back & possibly in front. Pt declines to schedule ED f/u at this time.  Pt c/o chronic dry scratchy cough (intermittent over 3 years), Denies h/a, fever. Pt states he was prescribed oxycodone, but would prefer not to take it; has not yet picked it up. Would like VV to discuss options for cough at this time. Appt scheduled today with PCP at 2:15

## 2021-12-08 ENCOUNTER — Ambulatory Visit: Payer: Medicare Other | Admitting: Family Medicine

## 2021-12-11 ENCOUNTER — Telehealth: Payer: Self-pay | Admitting: Family Medicine

## 2021-12-11 ENCOUNTER — Other Ambulatory Visit: Payer: Self-pay

## 2021-12-11 DIAGNOSIS — R059 Cough, unspecified: Secondary | ICD-10-CM

## 2021-12-11 MED ORDER — BENZONATATE 100 MG PO CAPS: 100.0000 mg | ORAL_CAPSULE | Freq: Three times a day (TID) | ORAL | 0 refills | Status: DC | PRN

## 2021-12-11 NOTE — Telephone Encounter (Signed)
Pharmacy updated.

## 2021-12-11 NOTE — Telephone Encounter (Signed)
Pt called to request a refill of the benzonatate (TESSALON PERLES) 100 MG capsule, but wants to know if he can be prescribed the generic version. Please advise.  [facility] [place], [place] - [address] Phone:  [phone]  Fax:  [phone]

## 2021-12-14 ENCOUNTER — Ambulatory Visit: Payer: BC Managed Care – PPO | Admitting: Adult Health

## 2021-12-14 DIAGNOSIS — R42 Dizziness and giddiness: Secondary | ICD-10-CM | POA: Diagnosis not present

## 2021-12-14 DIAGNOSIS — R569 Unspecified convulsions: Secondary | ICD-10-CM | POA: Diagnosis not present

## 2021-12-15 ENCOUNTER — Ambulatory Visit (INDEPENDENT_AMBULATORY_CARE_PROVIDER_SITE_OTHER): Payer: Medicare Other | Admitting: Family Medicine

## 2021-12-15 ENCOUNTER — Encounter: Payer: Self-pay | Admitting: Family Medicine

## 2021-12-15 VITALS — BP 130/64 | HR 63 | Temp 97.4°F | Ht 68.0 in | Wt 133.9 lb

## 2021-12-15 DIAGNOSIS — I10 Essential (primary) hypertension: Secondary | ICD-10-CM

## 2021-12-15 DIAGNOSIS — S2241XG Multiple fractures of ribs, right side, subsequent encounter for fracture with delayed healing: Secondary | ICD-10-CM

## 2021-12-15 DIAGNOSIS — R42 Dizziness and giddiness: Secondary | ICD-10-CM

## 2021-12-15 DIAGNOSIS — R053 Chronic cough: Secondary | ICD-10-CM

## 2021-12-15 MED ORDER — OLMESARTAN MEDOXOMIL 20 MG PO TABS: 20.0000 mg | ORAL_TABLET | Freq: Every day | ORAL | 5 refills | Status: DC

## 2021-12-15 NOTE — Progress Notes (Signed)
Established Patient Office Visit  Subjective   Patient ID: [name] Sosa, male    DOB: [birth year]  Age: 66 y.o. 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  He has had several months of paroxysmal vertigo symptoms sometimes associated with headache.  He is seen multiple neurologist including most recently at [facility].  Had recent EEG which showed some generalized brain slowing.  No obvious seizure activity.  Has been prescribed Ubrelvy.  Follow-up pending at [facility] neurology.  No new symptoms.  He had extensive work-up including recent MRI brain.  He relates dry cough which she states is been present for at least couple years.  Does take lisinopril.  Symptoms are worse when he lies down.  No postnasal drip.  No obvious GERD symptoms.  No hemoptysis.  No wheezing.  No appetite or weight changes.  He had recent fall with multiple right-sided rib fractures fifth through the eighth.  His pain had been 10 out of 10 currently about 5 out of 10 but coping fairly well and not requiring pain medications.  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   Past Surgical History:  Procedure Laterality Date   4v cabg     CARDIAC CATHETERIZATION     CARDIAC CATHETERIZATION N/A 03/23/2016   Procedure: Left Heart Cath and Cors/Grafts Angiography;  Surgeon: Kathleene Hazel, MD;  Location: [facility] INVASIVE CV LAB;  Service: Cardiovascular;  Laterality: N/A;   CAROTID ANGIOGRAPHY N/A 10/11/2016   Procedure: Carotid Angiography / Cerebral angiogram;  Surgeon: Fransisco Hertz, MD;  Location: [facility] INVASIVE CV LAB;  Service: Cardiovascular;  Laterality: N/A;   CAROTID ENDARTERECTOMY     COLONOSCOPY  2017   COLONOSCOPY W/ POLYPECTOMY     CORONARY ARTERY BYPASS GRAFT     ENDARTERECTOMY Left 10/16/2016   Procedure: LEFT CAROTID ENDARTERECTOMY WITH PATCH ANGIOPLASTY;  Surgeon: Fransisco Hertz, MD;  Location: [facility];  Service: Vascular;  Laterality: Left;   LEFT HEART CATH AND CORS/GRAFTS ANGIOGRAPHY N/A 01/19/2021   Procedure: LEFT HEART CATH AND CORS/GRAFTS ANGIOGRAPHY;  Surgeon: Kathleene Hazel, MD;  Location: [facility] INVASIVE CV LAB;  Service: Cardiovascular;  Laterality: N/A;   patent grafts     s/p 7     UPPER GASTROINTESTINAL ENDOSCOPY      reports that he quit smoking about 23 years ago. His smoking use included cigarettes. He has never used smokeless tobacco. He reports that he does not drink alcohol and does not use drugs. family history includes Heart disease in his father and mother; Heart failure in his father and mother; Hypertension in his brother, father, mother, and sister. Allergies  Allergen Reactions   Amlodipine     Chest pain    Lisinopril Cough    Review of Systems  Constitutional:  Negative for chills and fever.  Respiratory:  Positive for cough. Negative for hemoptysis, shortness of breath and wheezing.   Cardiovascular:  Negative for chest pain.  Neurological:  Positive for dizziness and headaches. Negative for focal weakness.      Objective:     BP 130/64 (BP Location: Left Arm, Patient Position: Sitting, Cuff Size: Normal)   Pulse 63   Temp (!) 97.4 F (36.3 C) (Oral)   Ht 5\' 8"  (1.727 m)   Wt 133 lb 14.4 oz (60.7 kg)   SpO2 99%   BMI 20.36 kg/m  BP Readings from Last 3 Encounters:  12/15/21 130/64  11/15/21 108/62  10/10/21  131/78   Wt Readings from Last 3 Encounters:  12/15/21 133 lb 14.4 oz (60.7 kg)  11/15/21 134 lb 12.8 oz (61.1 kg)  10/10/21 134 lb (60.8 kg)      Physical Exam Vitals reviewed.  Constitutional:      Appearance: Normal appearance.  Cardiovascular:     Rate and Rhythm: Normal rate and regular rhythm.  Pulmonary:     Effort: Pulmonary effort is normal.     Breath sounds: Normal breath sounds. No wheezing or rales.  Neurological:     General: No focal deficit present.     Mental Status: He is alert.      No results found for any visits on 12/15/21.    The ASCVD Risk score (Arnett DK, et al., 2019) failed to calculate for the following reasons:   The patient has a prior MI or stroke diagnosis    Assessment & Plan:   #1 multiple recent rib fractures on the right side.  Overall improving.  Continue frequent deep breathing.  Gradually resume regular activities  #2 chronic dry cough.  No obvious GERD or postnasal drip symptoms.  No wheezing.  Nonfocal exam.  Patient currently on ACE inhibitor with lisinopril.  Consider switch lisinopril to Benicar 20 mg once daily.  Set up follow-up in a month or so to recheck blood pressure.  Be in touch if cough not resolving next couple weeks off ACE inhibitor.  #3 recurrent bouts of vertigo.  Question migrainous vertigo.  Continue close follow-up with neurology.  Avoid any ladders or heights at this time until symptoms further stable.  Return in about 3 months (around 03/17/2022).    03/19/2022, MD

## 2021-12-15 NOTE — Patient Instructions (Signed)
Stop the Lisinopril and start the Olmesartan  Let me know in two weeks if cough no better.

## 2022-01-02 DIAGNOSIS — G43809 Other migraine, not intractable, without status migrainosus: Secondary | ICD-10-CM | POA: Diagnosis not present

## 2022-01-18 ENCOUNTER — Telehealth: Payer: Self-pay | Admitting: Neurology

## 2022-01-18 NOTE — Telephone Encounter (Signed)
Pt called with [employer] on the line wanting to inform us that the pt has had coverage with [facility] since 10/16/21.

## 2022-01-30 ENCOUNTER — Ambulatory Visit: Payer: BC Managed Care – PPO | Admitting: Adult Health

## 2022-02-12 DIAGNOSIS — R42 Dizziness and giddiness: Secondary | ICD-10-CM | POA: Diagnosis not present

## 2022-02-24 ENCOUNTER — Other Ambulatory Visit: Payer: Self-pay | Admitting: Cardiovascular Disease

## 2022-02-24 DIAGNOSIS — E7800 Pure hypercholesterolemia, unspecified: Secondary | ICD-10-CM

## 2022-02-26 ENCOUNTER — Telehealth: Payer: Self-pay | Admitting: Cardiovascular Disease

## 2022-02-26 ENCOUNTER — Other Ambulatory Visit: Payer: Self-pay | Admitting: Family Medicine

## 2022-02-26 DIAGNOSIS — R059 Cough, unspecified: Secondary | ICD-10-CM

## 2022-02-26 DIAGNOSIS — E7800 Pure hypercholesterolemia, unspecified: Secondary | ICD-10-CM

## 2022-02-26 MED ORDER — ROSUVASTATIN CALCIUM 20 MG PO TABS: 20.0000 mg | ORAL_TABLET | Freq: Every day | ORAL | 2 refills | Status: DC

## 2022-02-26 MED ORDER — RANOLAZINE ER 500 MG PO TB12: 500.0000 mg | ORAL_TABLET | Freq: Two times a day (BID) | ORAL | 2 refills | Status: DC

## 2022-02-26 MED ORDER — CARVEDILOL 3.125 MG PO TABS: 3.1250 mg | ORAL_TABLET | Freq: Two times a day (BID) | ORAL | 2 refills | Status: DC

## 2022-02-26 NOTE — Telephone Encounter (Signed)
Pt's medications were sent to pt's pharmacy as requested. Confirmation received.  

## 2022-02-26 NOTE — Telephone Encounter (Signed)
*  STAT* If patient is at the pharmacy, call can be transferred to refill team.   1. Which medications need to be refilled? (please list name of each medication and dose if known)  ranolazine (RANEXA) 500 MG 12 hr tablet  rosuvastatin (CRESTOR) 20 MG tablet  carvedilol (COREG) 3.125 MG tablet  2. Which pharmacy/location (including street and city if local pharmacy) is medication to be sent to? [facility] - [place], [place] - [address]  3. Do they need a 30 day or 90 day supply? 90

## 2022-02-28 MED ORDER — BENZONATATE 100 MG PO CAPS: 100.0000 mg | ORAL_CAPSULE | Freq: Three times a day (TID) | ORAL | 0 refills | Status: DC | PRN

## 2022-03-02 DIAGNOSIS — R42 Dizziness and giddiness: Secondary | ICD-10-CM | POA: Diagnosis not present

## 2022-03-22 DIAGNOSIS — I6521 Occlusion and stenosis of right carotid artery: Secondary | ICD-10-CM | POA: Diagnosis not present

## 2022-03-22 DIAGNOSIS — Z9889 Other specified postprocedural states: Secondary | ICD-10-CM | POA: Diagnosis not present

## 2022-03-30 ENCOUNTER — Ambulatory Visit (INDEPENDENT_AMBULATORY_CARE_PROVIDER_SITE_OTHER): Payer: Medicare Other | Admitting: Family Medicine

## 2022-03-30 ENCOUNTER — Encounter: Payer: Self-pay | Admitting: Family Medicine

## 2022-03-30 VITALS — BP 110/70 | HR 70 | Temp 97.4°F | Ht 68.0 in | Wt 140.6 lb

## 2022-03-30 DIAGNOSIS — I10 Essential (primary) hypertension: Secondary | ICD-10-CM | POA: Diagnosis not present

## 2022-03-30 DIAGNOSIS — E7800 Pure hypercholesterolemia, unspecified: Secondary | ICD-10-CM | POA: Diagnosis not present

## 2022-03-30 DIAGNOSIS — G43809 Other migraine, not intractable, without status migrainosus: Secondary | ICD-10-CM | POA: Diagnosis not present

## 2022-03-30 LAB — MAGNESIUM: Magnesium: 2.1 mg/dL (ref 1.5–2.5)

## 2022-03-30 LAB — LIPID PANEL
Cholesterol: 146 mg/dL (ref 0–200)
HDL: 53.9 mg/dL (ref >39.00)
LDL Cholesterol: 64 mg/dL (ref 0–99)
NonHDL: 92.38
Total CHOL/HDL Ratio: 3
Triglycerides: 140 mg/dL (ref 0.0–149.0)
VLDL: 28 mg/dL (ref 0.0–40.0)

## 2022-03-30 LAB — COMPREHENSIVE METABOLIC PANEL
ALT: 23 U/L (ref 0–53)
AST: 22 U/L (ref 0–37)
Albumin: 4.4 g/dL (ref 3.5–5.2)
Alkaline Phosphatase: 51 U/L (ref 39–117)
BUN: 16 mg/dL (ref 6–23)
CO2: 31 mEq/L (ref 19–32)
Calcium: 9.5 mg/dL (ref 8.4–10.5)
Chloride: 102 mEq/L (ref 96–112)
Creatinine, Ser: 0.99 mg/dL (ref 0.40–1.50)
GFR: 79.89 mL/min (ref >60.00)
Glucose, Bld: 67 mg/dL — ABNORMAL LOW (ref 70–99)
Potassium: 4.5 mEq/L (ref 3.5–5.1)
Sodium: 138 mEq/L (ref 135–145)
Total Bilirubin: 0.6 mg/dL (ref 0.2–1.2)
Total Protein: 7.1 g/dL (ref 6.0–8.3)

## 2022-03-30 NOTE — Progress Notes (Unsigned)
Established Patient Office Visit  Subjective   Patient ID: [name] Sosa, male    DOB: 03-25-1956  Age: 66 y.o. MRN: [id number]  Chief Complaint  Patient presents with   Follow-up    HPI  {History (Optional):23778} Seen for medical follow-up.  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  Called neurologist at [facility] and was placed on Zonisamide-in this has helped his dizziness but unfortunately developed some daily headache and he stopped this on his own.  He has not followed up with neurology yet.  He had done some reading and was concerned about possible electrolyte disturbance being a culprit with vestibular migraines.  He had recent cough and we suspected ACE inhibitor related cough.  He was switched from lisinopril to Benicar and has had no cough whatsoever since then.  Blood pressures well controlled.  He has history of CAD and peripheral vascular disease.  Recently had follow-up with vascular surgery and they said suggested higher dose statin use.  He is currently on rosuvastatin 20 mg daily and tolerating well.  Not had lipid panel since April 2022.  At that time cholesterol 125 with LDL 42.  No recent chest pains.  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   Past Surgical History:  Procedure Laterality Date   4v cabg     CARDIAC CATHETERIZATION     CARDIAC CATHETERIZATION N/A 03/23/2016   Procedure: Left Heart Cath and Cors/Grafts  Angiography;  Surgeon: Burnell Blanks, MD;  Location: [place] [facility];  Service: Cardiovascular;  Laterality: N/A;   CAROTID ANGIOGRAPHY N/A 10/11/2016   Procedure: Carotid Angiography / Cerebral angiogram;  Surgeon: Conrad Clearwater, MD;  Location: [place] [facility];  Service: Cardiovascular;  Laterality: N/A;   CAROTID ENDARTERECTOMY     COLONOSCOPY  2017   COLONOSCOPY W/ POLYPECTOMY     CORONARY ARTERY BYPASS GRAFT     ENDARTERECTOMY Left 10/16/2016   Procedure: LEFT CAROTID ENDARTERECTOMY WITH PATCH ANGIOPLASTY;  Surgeon: Conrad Orick, MD;  Location: [place];  Service: Vascular;  Laterality: Left;   LEFT HEART CATH AND CORS/GRAFTS ANGIOGRAPHY N/A 01/19/2021   Procedure: LEFT HEART CATH AND CORS/GRAFTS ANGIOGRAPHY;  Surgeon: Burnell Blanks, MD;  Location: [place] [facility];  Service: Cardiovascular;  Laterality: N/A;   patent grafts     s/p 7     UPPER GASTROINTESTINAL ENDOSCOPY      reports that he quit smoking about 23 years ago. His smoking use included cigarettes. He has never used smokeless tobacco. He reports that he does not drink alcohol and does not use drugs. family history includes Heart disease in his father and mother; Heart failure in his father and mother; Hypertension in his brother, father, mother, and sister. Allergies  Allergen Reactions   Amlodipine     Chest pain    Lisinopril Cough      Review of Systems  Constitutional:  Negative for malaise/fatigue.  Eyes:  Negative for blurred vision.  Respiratory:  Negative for shortness of breath.   Cardiovascular:  Negative for chest pain.  Neurological:  Negative for dizziness, weakness and headaches.      Objective:     BP 110/70 (BP Location: Left Arm, Patient Position: Sitting, Cuff Size: Normal)   Pulse 70   Temp (!) 97.4 F (36.3 C) (Oral)   Ht 5\' 8"  (1.727 m)   Wt 140 lb 9.6 oz (63.8 kg)   SpO2 98%   BMI 21.38 kg/m  {Vitals History (Optional):23777}  Physical Exam Vitals reviewed.   Constitutional:      Appearance: He is well-developed.  HENT:     Right Ear: External ear normal.     Left Ear: External ear normal.  Eyes:     Pupils: Pupils are equal, round, and reactive to light.  Neck:     Thyroid: No thyromegaly.  Cardiovascular:     Rate and Rhythm: Normal rate and regular rhythm.  Pulmonary:     Effort: Pulmonary effort is normal. No respiratory distress.     Breath sounds: Normal breath sounds. No wheezing or rales.  Musculoskeletal:     Cervical back: Neck supple.     Right lower leg: No edema.     Left lower leg: No edema.  Neurological:     Mental Status: He is alert and oriented to person, place, and time.      Results for orders placed or performed in visit on 03/30/22  Magnesium  Result Value Ref Range   Magnesium 2.1 1.5 - 2.5 mg/dL  CMP  Result Value Ref Range   Sodium 138 135 - 145 mEq/L   Potassium 4.5 3.5 - 5.1 mEq/L   Chloride 102 96 - 112 mEq/L   CO2 31 19 - 32 mEq/L   Glucose, Bld 67 (L) 70 - 99 mg/dL   BUN 16 6 - 23 mg/dL   Creatinine, Ser 04/01/22 0.40 - 1.50 mg/dL   Total Bilirubin 0.6 0.2 - 1.2 mg/dL   Alkaline Phosphatase 51 39 - 117 U/L   AST 22 0 - 37 U/L   ALT 23 0 - 53 U/L   Total Protein 7.1 6.0 - 8.3 g/dL   Albumin 4.4 3.5 - 5.2 g/dL   GFR 0.93 26.71 mL/min   Calcium 9.5 8.4 - 10.5 mg/dL  Lipid panel  Result Value Ref Range   Cholesterol 146 0 - 200 mg/dL   Triglycerides >24.58 0.0 - 149.0 mg/dL   HDL 099.8 33.82 mg/dL   VLDL >50.53 0.0 - 97.6 mg/dL   LDL Cholesterol 64 0 - 99 mg/dL   Total CHOL/HDL Ratio 3    NonHDL 92.38     {Labs (Optional):23779}  The ASCVD Risk score (Arnett DK, et al., 2019) failed to calculate for the following reasons:   The patient has a prior MI or stroke diagnosis    Assessment & Plan:   #1 hyperlipidemia.  Known peripheral vascular disease.  History of left carotid endarterectomy in moderate disease right internal carotid without recent TIA symptoms.  We discussed high-dose  statin use and recommend increasing Crestor from 20 to 40 mg. -Check lipid and hepatic panel today  #2 recent chronic cough resolved with switching from ACE inhibitor to Benicar.  #3 hypertension currently well controlled on carvedilol and Benicar.  Continue current regimen  #4 history of vestibular migraines.  Currently has no dizziness.  Continue close follow-up with [facility] neurology  No follow-ups on file.    Carolann Littler, MD

## 2022-04-02 ENCOUNTER — Telehealth: Payer: Self-pay | Admitting: Family Medicine

## 2022-04-02 NOTE — Telephone Encounter (Signed)
See result note.  

## 2022-04-02 NOTE — Telephone Encounter (Signed)
Pt is returning mykal call concerning blood work results 

## 2022-04-11 ENCOUNTER — Other Ambulatory Visit: Payer: Self-pay | Admitting: Neurology

## 2022-04-16 ENCOUNTER — Telehealth: Payer: Self-pay | Admitting: Family Medicine

## 2022-04-16 NOTE — Telephone Encounter (Signed)
-----   Message from Sandford Craze, RN sent at 04/13/2022  2:54 PM EDT ----- Regarding: FW: welcome to medicare appt Please call patient to schedule welcome to medicaid with Provider. Thank you  ----- Message ----- From: Darl Householder Sent: 04/13/2022  10:17 AM EDT To: Sandford Craze, RN; Mamie Nick Subject: welcome to medicare appt                       Patient left message stating he was told to call me to schedule wellness visit.  Patient needs welcome to medicare w/provider before 10/17/22.  I tried calling patient  no answer.  Can you help patient with this?  I wasn't for sure when you would like him to have is Welcome to [facility] appointment.  Thank you

## 2022-04-16 NOTE — Telephone Encounter (Signed)
Left voicemail for patient to callback and schedule welcome to medicare visit        [place]

## 2022-04-17 ENCOUNTER — Other Ambulatory Visit: Payer: Self-pay | Admitting: Neurology

## 2022-04-23 ENCOUNTER — Ambulatory Visit: Payer: Medicare Other | Admitting: Family Medicine

## 2022-04-24 ENCOUNTER — Other Ambulatory Visit: Payer: Self-pay | Admitting: Family Medicine

## 2022-04-24 MED ORDER — ROSUVASTATIN CALCIUM 40 MG PO TABS: 40.0000 mg | ORAL_TABLET | Freq: Every day | ORAL | 3 refills | Status: DC

## 2022-04-24 NOTE — Telephone Encounter (Signed)
Rx sent 

## 2022-04-24 NOTE — Telephone Encounter (Signed)
Pt is calling and would like #90 with refills osuvastatin (CRESTOR) 40 MG tablet  [place], [place] Phone: [phone]  Fax: [phone]

## 2022-05-08 ENCOUNTER — Ambulatory Visit (INDEPENDENT_AMBULATORY_CARE_PROVIDER_SITE_OTHER): Payer: Medicare Other | Admitting: Family Medicine

## 2022-05-08 ENCOUNTER — Encounter: Payer: Self-pay | Admitting: Family Medicine

## 2022-05-08 ENCOUNTER — Telehealth: Payer: Self-pay

## 2022-05-08 VITALS — BP 108/60 | HR 73 | Temp 97.9°F | Ht 68.0 in | Wt 145.1 lb

## 2022-05-08 DIAGNOSIS — T466X5A Adverse effect of antihyperlipidemic and antiarteriosclerotic drugs, initial encounter: Secondary | ICD-10-CM

## 2022-05-08 DIAGNOSIS — G43809 Other migraine, not intractable, without status migrainosus: Secondary | ICD-10-CM | POA: Diagnosis not present

## 2022-05-08 DIAGNOSIS — E7800 Pure hypercholesterolemia, unspecified: Secondary | ICD-10-CM | POA: Diagnosis not present

## 2022-05-08 MED ORDER — ROSUVASTATIN CALCIUM 20 MG PO TABS: 20.0000 mg | ORAL_TABLET | Freq: Every day | ORAL | 3 refills | Status: DC

## 2022-05-08 NOTE — Patient Instructions (Signed)
Let's go back on Crestor 20 mg daily  Let me know if muscle aches recur.

## 2022-05-08 NOTE — Telephone Encounter (Signed)
--  Caller states he is having abdomen pain and dizziness. He is also having heartburns at night and chest pain. Last felt CP/ angina 2-3 days- has had that for years per caller. Dizziness comes and goes. Stomach ache as soon as he eats right after, pain last all day- cramps. Does not want to eat.  05/07/2022 1:49:59 PM See HCP within 4 Hours (or PCP triage) Jayme Cloud, RN, [facility] Ricks  Comments User: [name], RN Date/Time [name] Sosa Time): 05/07/2022 1:49:49 PM Explained to caller no likelihood for appointment within recommended time frame, He was referred to UC and and he states he wants appointment with office, even if outside of recommended time frame. He was transferred to back line appointment scheduling for appointment.  Referrals GO TO FACILITY REFUSED Warm transfer to backline  Pt has appt with PCP on 05/08/22

## 2022-05-08 NOTE — Progress Notes (Unsigned)
Established Patient Office Visit  Subjective   Patient ID: [name] Sosa, male    DOB: 12/25/1955  Age: 66 y.o. MRN: [id number]  Chief Complaint  Patient presents with   Abdominal Pain    Patient complains of abdominal pain, x1 week     HPI  {History (Optional):23778} [name] Sosa is seen with some upper abdominal pain last week several days after increasing his Crestor dosage.  He also noticed some achiness in his arms.  He felt like this was the Crestor side effect.  He has tolerated 20 mg dose and after holding Crestor for few days symptoms subsided.  No recent melena.  He has history of CAD with CABG 2000 and also history of peripheral vascular disease with carotid artery occlusion.  Recent LDL cholesterol 64.  We had suggested high-dose statin but again possibly not able to tolerate as above.  He is willing to go back to 20 mg Crestor at this time.  Denies any recent chest pains.  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  They are apparently considering calcitonin gene related peptide medication injectable  Past Medical History:  Diagnosis Date   CAD (coronary artery disease)    s/p 7 vessel CABG 2000 at [facility] // Echo 11/2018: EF 45-50, antero-apical and inferior akinesis // Myoview 11/2018: EF 51, distal ant/apical infarct and small inferior infarct with very mild peri-infarct ischemia; Low Risk   Carotid artery occlusion    High cholesterol    Hyperlipidemia    Hypertension    Pre-diabetes    < 6.1 2017   Stroke ([place]) 04/2016   TIA- 04/2016 double vision 2nd - speech - no residual   Trochlear neuropathy, 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   Past Surgical History:  Procedure Laterality Date   4v cabg     CARDIAC CATHETERIZATION     CARDIAC CATHETERIZATION N/A 03/23/2016   Procedure: Left Heart Cath and Cors/Grafts Angiography;  Surgeon: Burnell Blanks, MD;  Location: [place] [facility];  Service: Cardiovascular;  Laterality: N/A;   CAROTID ANGIOGRAPHY N/A 10/11/2016    Procedure: Carotid Angiography / Cerebral angiogram;  Surgeon: Conrad Miles, MD;  Location: [place] [facility];  Service: Cardiovascular;  Laterality: N/A;   CAROTID ENDARTERECTOMY     COLONOSCOPY  2017   COLONOSCOPY W/ POLYPECTOMY     CORONARY ARTERY BYPASS GRAFT     ENDARTERECTOMY Left 10/16/2016   Procedure: LEFT CAROTID ENDARTERECTOMY WITH PATCH ANGIOPLASTY;  Surgeon: Conrad [place], MD;  Location: [place];  Service: Vascular;  Laterality: Left;   LEFT HEART CATH AND CORS/GRAFTS ANGIOGRAPHY N/A 01/19/2021   Procedure: LEFT HEART CATH AND CORS/GRAFTS ANGIOGRAPHY;  Surgeon: Burnell Blanks, MD;  Location: [place] [facility];  Service: Cardiovascular;  Laterality: N/A;   patent grafts     s/p 7     UPPER GASTROINTESTINAL ENDOSCOPY      reports that he quit smoking about 23 years ago. His smoking use included cigarettes. He has never used smokeless tobacco. He reports that he does not drink alcohol and does not use drugs. family history includes Heart disease in his father and mother; Heart failure in his father and mother; Hypertension in his brother, father, mother, and sister. Allergies  Allergen Reactions   Amlodipine     Chest pain    Lisinopril Cough    Review of Systems  Constitutional:  Negative for malaise/fatigue.  Eyes:  Negative for blurred vision.  Respiratory:  Negative for shortness of breath.   Cardiovascular:  Negative for chest pain.  Neurological:  Negative for dizziness, weakness and headaches.      Objective:     BP 108/60 (BP Location: Left Arm, Patient Position: Sitting, Cuff Size: Normal)   Pulse 73   Temp 97.9 F (36.6 C) (Oral)   Ht _0  (1.727 m)   Wt 145 lb 1.6 oz (65.8 kg)   SpO2 98%   BMI 22.06 kg/m  BP Readings from Last 3 Encounters:  05/08/22 108/60  03/30/22 110/70  12/15/21 130/64   Wt Readings from Last 3 Encounters:  05/08/22 145 lb 1.6 oz (65.8 kg)  03/30/22 140 lb 9.6 oz (63.8 kg)  12/15/21 133 lb 14.4 oz (60.7 kg)       Physical Exam Constitutional:      Appearance: He is well-developed.  HENT:     Right Ear: External ear normal.     Left Ear: External ear normal.  Eyes:     Pupils: Pupils are equal, round, and reactive to light.  Neck:     Thyroid: No thyromegaly.  Cardiovascular:     Rate and Rhythm: Normal rate and regular rhythm.  Pulmonary:     Effort: Pulmonary effort is normal. No respiratory distress.     Breath sounds: Normal breath sounds. No wheezing or rales.  Musculoskeletal:     Cervical back: Neck supple.  Neurological:     Mental Status: He is alert and oriented to person, place, and time.      No results found for any visits on 05/08/22.  Last CBC Lab Results  Component Value Date   WBC 5.8 10/10/2021   HGB 14.8 10/10/2021   HCT 43.3 10/10/2021   MCV 94 10/10/2021   MCH 32.0 10/10/2021   RDW 11.9 10/10/2021   PLT 246 52/84/1324   Last metabolic panel Lab Results  Component Value Date   GLUCOSE 67 (L) 03/30/2022   NA 138 03/30/2022   K 4.5 03/30/2022   CL 102 03/30/2022   CO2 31 03/30/2022   BUN 16 03/30/2022   CREATININE 0.99 03/30/2022   EGFR 92 10/10/2021   CALCIUM 9.5 03/30/2022   PROT 7.1 03/30/2022   ALBUMIN 4.4 03/30/2022   LABGLOB 2.2 10/10/2021   AGRATIO 2.0 10/10/2021   BILITOT 0.6 03/30/2022   ALKPHOS 51 03/30/2022   AST 22 03/30/2022   ALT 23 03/30/2022   ANIONGAP 7 10/17/2016   Last lipids Lab Results  Component Value Date   CHOL 146 03/30/2022   HDL 53.90 03/30/2022   LDLCALC 64 03/30/2022   TRIG 140.0 03/30/2022   CHOLHDL 3 03/30/2022   Last hemoglobin A1c Lab Results  Component Value Date   HGBA1C 5.8 (H) 10/10/2021   Last thyroid functions Lab Results  Component Value Date   TSH 0.781 10/10/2021   T4TOTAL 7.4 10/24/2016      The ASCVD Risk score (Arnett DK, et al., 2019) failed to calculate for the following reasons:   The patient has a prior MI or stroke diagnosis    Assessment & Plan:   #1 dyslipidemia.   Target LDL goal less than 55.  Did not tolerate high-dose statin with Crestor 40 mg daily.  We wrote prescription for 20 mg daily.  We did discuss other potential options such as PCSK9 inhibitor or possibly addition of Zetia.  Coverage may be an issue with PCSK9 medication  No follow-ups on file.    Carolann Littler, MD

## 2022-05-13 ENCOUNTER — Other Ambulatory Visit: Payer: Self-pay | Admitting: Family Medicine

## 2022-05-13 DIAGNOSIS — E785 Hyperlipidemia, unspecified: Secondary | ICD-10-CM

## 2022-05-16 ENCOUNTER — Ambulatory Visit (INDEPENDENT_AMBULATORY_CARE_PROVIDER_SITE_OTHER): Payer: Medicare Other | Admitting: Family Medicine

## 2022-05-16 ENCOUNTER — Encounter: Payer: Self-pay | Admitting: Family Medicine

## 2022-05-16 VITALS — BP 106/60 | HR 77 | Temp 97.5°F | Ht 68.0 in | Wt 144.4 lb

## 2022-05-16 DIAGNOSIS — Z0000 Encounter for general adult medical examination without abnormal findings: Secondary | ICD-10-CM

## 2022-05-16 DIAGNOSIS — Z23 Encounter for immunization: Secondary | ICD-10-CM | POA: Diagnosis not present

## 2022-05-16 NOTE — Patient Instructions (Signed)
Consider Shingrix vaccine at some point in the next year.    

## 2022-05-16 NOTE — Progress Notes (Signed)
Established Patient Office Visit  Subjective   Patient ID: [name] Sosa, male    DOB: Apr 06, 1956  Age: 66 y.o. MRN: [id number]  Chief Complaint  Patient presents with   Medicare Wellness    HPI   Here for welcome to Medicare exam. He has medical history significant for CAD, chronic systolic heart failure, hyperlipidemia, vestibular migraines, spinal stenosis cervical region, peripheral vascular disease with history of left carotid endarterectomy.  1.  Risk factors based on Past Medical , Social, and Family history reviewed and as indicated above with no changes  Past Medical History:  Diagnosis Date   CAD (coronary artery disease)    s/p 7 vessel CABG 2000 at [facility] // Echo 11/2018: EF 45-50, antero-apical and inferior akinesis // Myoview 11/2018: EF 51, distal ant/apical infarct and small inferior infarct with very mild peri-infarct ischemia; Low Risk   Carotid artery occlusion    High cholesterol    Hyperlipidemia    Hypertension    Pre-diabetes    < 6.1 2017   Stroke ([facility]) 04/2016   TIA- 04/2016 double vision 2nd - speech - no residual   Trochlear neuropathy, 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  Limitations in physical activities None.  No recent falls.  Very good balance.  3.  Depression/mood No active depression or anxiety issues PHQ 2 equals 0.  4.  Hearing No deficits.  He states he went to ENT recently and had a audiology assessment which came back relatively normal.  5.  ADLs independent in all.  6.  Cognitive function (orientation to time and place, language, writing, speech,memory) no short or long term memory issues.  Language and judgement intact.  No problems with short-term recall.    7.  Home Safety no issues  8.  Height, weight, and visual acuity.all stable. Wt Readings from Last 3 Encounters:  05/16/22 144 lb 6.4 oz (65.5 kg)  05/08/22 145 lb 1.6 oz (65.8 kg)  03/30/22 140 lb 9.6 oz (63.8 kg)   Vision screen today is 20/50 right eye, 20/50 left eye, and 20/40  both eyes.  He has bilateral cataracts and is already seen ophthalmologist for that.  9.  Counseling discussed  Counseled regarding age and gender appropriate preventative screenings and immunizations.  10. Recommendation of preventive services -Prevnar 20 given.  We discussed Shingrix vaccine and he will consider at some point later on. -Colonoscopy up-to-date -Flu vaccine already given . 11. Labs based on risk factors-none indicated at this time  12. Care Plan-as below  13. Other Providers-Dr. [name] cardiology.  [facility]  14. Written schedule of screening/prevention services given to patient.  Health Maintenance  Topic Date Due   HIV Screening  Never done   Hepatitis C Screening  Never done   Zoster Vaccines- Shingrix (1 of 2) Never done   DTaP/Tdap/Td (2 - Td or Tdap) 06/18/2018   COVID-19 Vaccine (3 - Pfizer risk series) 11/11/2019   COLONOSCOPY (Pts 45-50yrs Insurance coverage will need to be confirmed)  12/28/2030   Pneumonia Vaccine 53+ Years old  Completed   HPV VACCINES  Aged Out   15.  Advanced directives.  Discussed living will, medical power of attorney, DNR, MOST forms.  Currently does not have these in place.  Handout given.   Review of Systems  Constitutional:  Negative for malaise/fatigue.  Eyes:  Negative for blurred vision.  Respiratory:  Negative for shortness of breath.   Cardiovascular:  Negative for chest pain.  Neurological:  Negative for dizziness and  weakness.      Objective:     BP 106/60 (BP Location: Left Arm, Patient Position: Sitting, Cuff Size: Normal)   Pulse 77   Temp (!) 97.5 F (36.4 C) (Oral)   Ht 5\' 8"  (1.727 m)   Wt 144 lb 6.4 oz (65.5 kg)   SpO2 97%   BMI 21.96 kg/m  BP Readings from Last 3 Encounters:  05/16/22 106/60  05/08/22 108/60  03/30/22 110/70   Wt Readings from Last 3 Encounters:  05/16/22 144 lb 6.4 oz (65.5 kg)  05/08/22 145 lb 1.6 oz (65.8 kg)  03/30/22 140 lb 9.6 oz (63.8 kg)      Physical  Exam Constitutional:      Appearance: He is well-developed.  HENT:     Right Ear: External ear normal.     Left Ear: External ear normal.  Eyes:     Pupils: Pupils are equal, round, and reactive to light.  Neck:     Thyroid: No thyromegaly.  Cardiovascular:     Rate and Rhythm: Normal rate and regular rhythm.  Pulmonary:     Effort: Pulmonary effort is normal. No respiratory distress.     Breath sounds: Normal breath sounds. No wheezing or rales.  Musculoskeletal:     Cervical back: Neck supple.  Neurological:     Mental Status: He is alert and oriented to person, place, and time.      No results found for any visits on 05/16/22.    The ASCVD Risk score (Arnett DK, et al., 2019) failed to calculate for the following reasons:   The patient has a prior MI or stroke diagnosis    Assessment & Plan:   Welcome to Medicare initial physical exam.    -Prevnar 20 given. -continue with annual flu vaccine -consider Shingrix vaccine at some point this year.  -colonoscopy up to date.    No follow-ups on file.    2020, MD

## 2022-05-18 ENCOUNTER — Telehealth: Payer: Self-pay | Admitting: Family Medicine

## 2022-05-18 NOTE — Chronic Care Management (AMB) (Signed)
  Care Management   Follow Up Note   05/18/2022 Name: [name] Sosa MRN: [id number] DOB: [birth year]   Referred by: Kristian Covey, MD Reason for referral : No chief complaint on file.   An unsuccessful telephone outreach was attempted today. The patient was referred to the case management team for assistance with care management and care coordination.   Follow Up Plan: The care management team will reach out to the patient again over the next 1 days.   SIGNATURE  [place]

## 2022-05-21 ENCOUNTER — Telehealth: Payer: Self-pay | Admitting: Family Medicine

## 2022-05-21 NOTE — Chronic Care Management (AMB) (Signed)
  Care Management  Note   05/21/2022 Name: [name] Sosa MRN: [id number] DOB: 1955/11/27  [name] is a 66 y.o. year old male who is a primary care patient of Burchette, Alinda Sierras, MD. The care management team was consulted for assistance with chronic disease management and care coordination needs.   [name] was given information about Care Management services today including:  CCM service includes personalized support from designated clinical staff supervised by the physician, including individualized plan of care and coordination with other care providers 24/7 contact phone numbers for assistance for urgent and routine care needs. Service will only be billed when office clinical staff spend 20 minutes or more in a month to coordinate care. Only one practitioner may furnish and bill the service in a calendar month. The patient may stop CCM services at amy time (effective at the end of the month) by phone call to the office staff. The patient will be responsible for cost sharing (co-pay) or up to 20% of the service fee (after annual deductible is met)  Patient agreed to services and verbal consent obtained.  Follow up plan:   Face to Face appointment with care management team member scheduled for: 06/01/22 _0    Noelle Penner Upstream Scheduler

## 2022-05-21 NOTE — Progress Notes (Signed)
  Care Management   Follow Up Note   05/21/2022 Name: [name] Sosa MRN: [id number] DOB: 1955-12-29   Referred by: Kristian Covey, MD Reason for referral : No chief complaint on file.   A second unsuccessful telephone outreach was attempted today. The patient was referred to the case management team for assistance with care management and care coordination.   Follow Up Plan: The care management team will reach out to the patient again over the next 1 days.   SIGNATURE  [place]

## 2022-05-29 ENCOUNTER — Telehealth: Payer: Self-pay | Admitting: Pharmacist

## 2022-05-29 DIAGNOSIS — H4911 Fourth [trochlear] nerve palsy, right eye: Secondary | ICD-10-CM | POA: Diagnosis not present

## 2022-05-29 DIAGNOSIS — H532 Diplopia: Secondary | ICD-10-CM | POA: Diagnosis not present

## 2022-05-29 DIAGNOSIS — H25813 Combined forms of age-related cataract, bilateral: Secondary | ICD-10-CM | POA: Diagnosis not present

## 2022-05-29 DIAGNOSIS — H353121 Nonexudative age-related macular degeneration, left eye, early dry stage: Secondary | ICD-10-CM | POA: Diagnosis not present

## 2022-05-29 DIAGNOSIS — H524 Presbyopia: Secondary | ICD-10-CM | POA: Diagnosis not present

## 2022-05-29 NOTE — Chronic Care Management (AMB) (Signed)
Chronic Care Management Pharmacy Assistant   Name: [name] Sosa  MRN: [id number] DOB: 17-Aug-1955  [name] Sosa is an 66 y.o. year old male who presents for his initial CCM visit with the clinical pharmacist.  Reason for Encounter: Chart prep for initial visit with Gaylord Shih the clinical pharmacist on 06/01/2022 at 11:00 in the office.    Conditions to be addressed/monitored: CHF, CAD, HTN, HLD, and CTS and Prediabetes  Recent office visits:  05/16/2022 Evelena Peat MD - Welcome to Medicare and an additional concern. Discontinued Venlafaxine and Zonisamide. No follow up noted.   05/08/2022 Evelena Peat MD - Patient was seen for pure hypercholesterolemia and an additional concern. Decreased Rosuvastatin to 20 mg daily. No follow up noted.   03/30/2022 Evelena Peat MD - Patient was seen for pure hypercholesterolemia and additional concerns. Increased Rosuvastatin to 40 mg. Discontinued Benzonatate and Ubrogepant. No follow up noted.   12/15/2021 Evelena Peat MD - Patient was seen for essential hypertension and additional concerns. Started olmesartan 20 mg daily. Discontinued clopidogrel and lisinopril. Follow up in 3 months.   11/28/2021 Evelena Peat MD - Patient was seen for Closed fracture of multiple ribs of right side with routine healing, subsequent encounter  and an additional concern. Started Benzonatate. Follow-up immediately for any fever, increased shortness of breath, or other concern.  10/03/2021 Evelena Peat MD - Patient was seen for Recurrent occipital headache. No medication changes. No follow up noted.   09/11/2021 Evelena Peat MD - Patient was seen for vertigo and an additional concern. No medication changes. No follow up noted.   Recent consult visits:  05/08/2022 Jerl Mina MD([facility]) - Patient was seen for vestibular migraine. Discontinued Zonisamide, Nortriptyline and Venlafaxine. No follow up noted.   03/2022 Nelida Meuse  PA ([facility]) - Patient was seen for right carotid stenosis. No medication changes. Follow up in 1 year.   02/12/2022 Linus Salmons (ENT) - Patient was seen for dizziness and giddiness. No additional chart notes.   01/02/2022 Jerl Mina MD([facility]) - Patient was seen for vestibular migraine. No additional chart notes.   12/14/2021 Wynonia Lawman ([facility]) - Patient was seen for unspecified convulsions and an additional concern. No additional chart notes.   Hospital visits:  None  Medications: Outpatient Encounter Medications as of 05/29/2022  Medication Sig   aspirin EC 81 MG tablet Take 1 tablet (81 mg total) by mouth daily. Swallow whole.   carvedilol (COREG) 3.125 MG tablet Take 1 tablet (3.125 mg total) by mouth 2 (two) times daily with a meal.   nitroGLYCERIN (NITROSTAT) 0.4 MG SL tablet Place 1 tablet (0.4 mg total) under the tongue every 5 (five) minutes as needed for chest pain (up to 3 doses).   nortriptyline (PAMELOR) 50 MG capsule Take 50 mg by mouth at bedtime.   olmesartan (BENICAR) 20 MG tablet Take 1 tablet (20 mg total) by mouth daily.   ranolazine (RANEXA) 500 MG 12 hr tablet Take 1 tablet (500 mg total) by mouth 2 (two) times daily.   rosuvastatin (CRESTOR) 20 MG tablet Take 1 tablet (20 mg total) by mouth daily.   No facility-administered encounter medications on file as of 05/29/2022.  Fill History:   Dispensed Days Supply Quantity Provider Pharmacy  CARVEDILOL 3.125MG   TAB 05/01/2022 90 180 each      Dispensed Days Supply Quantity Provider Pharmacy  NITROGLYCER 0.4MG    SUB 11/15/2021 25 25 each      Dispensed Days Supply Quantity Provider Pharmacy  NORTRIPTYLIN  50MG    CAP 05/09/2022 30 30 each      Dispensed Days Supply Quantity Provider Pharmacy  OLMESARTAN 20MG  TAB 03/19/2022 90 90 each      Dispensed Days Supply Quantity Provider Pharmacy  RANOLAZINE ER 500MG  TAB 05/23/2022 90 180 each      Dispensed Days Supply Quantity Provider  Pharmacy  ROSUVASTATIN 20MG  TAB 05/09/2022 90 90 each     Have you seen any other providers since your last visit? **Patient denies any new providers  Any changes in your medications or health? Patient states he has a new injection waiting for him at the pharmacy for his migraines. He doesn't know the name of it yet, he will bring it to his visit with all his medications.   Any side effects from any medications? Patient denies, states he is doing good with Crestor at 20 mg.   Do you have an symptoms or problems not managed by your medications? Patient denies other than migraines, trying to get them controlled.   Any concerns about your health right now? Patient denies.   Has your provider asked that you check blood pressure, blood sugar, or follow special diet at home? Patient denies, states he has followed a low fat diet for 10-15 years.   Do you get any type of exercise on a regular basis? Patient walks 4-5 days per week, 45 -60 minutes.   Can you think of a goal you would like to reach for your health? Patient denies.   Do you have any problems getting your medications? Patient denies.   Is there anything that you would like to discuss during the appointment? Patient denies.   Please bring medications, blood pressure monitor and supplements to appointment  Care Gaps: AWV - completed 05/16/2022 Last BP - 106/60 on 05/16/2022 Last A1C - 5.8 on 10/10/2021 HIV screen - never done Hep C Screen - never done Shingrix - never done Tdap - overdue Covid - overdue  Star Rating Drugs: Rosuvastatin 20 mg - last filled 05/09/2022 90 DS at [facility] Olmesartan 20 mg  - last filled 03/19/2022 90 DS at [facility]  Inetta Fermo [facility]  Clinical Pharmacist Assistant [phone]

## 2022-05-31 NOTE — Progress Notes (Unsigned)
Chronic Care Management Pharmacy Note  06/04/2022 Name:  [name] Sosa MRN:  [id number] DOB:  04-Apr-1956  Summary: LDL not at goal < 55 BP at goal < 130/80  Recommendations/Changes made from today's visit: -Recommended addition of Repatha per patient preference -Recommended routine BP monitoring at home  Plan: Tolerance assessment in 3-4 weeks Lipid panel in 2-3 months   Subjective: [name] Sosa is an 66 y.o. year old male who is a primary patient of Burchette, Alinda Sierras, MD.  The CCM team was consulted for assistance with disease management and care coordination needs.    Engaged with patient face to face for initial visit in response to provider referral for pharmacy case management and/or care coordination services.   Consent to Services:  The patient was given the following information about Chronic Care Management services today, agreed to services, and gave verbal consent: 1. CCM service includes personalized support from designated clinical staff supervised by the primary care provider, including individualized plan of care and coordination with other care providers 2. 24/7 contact phone numbers for assistance for urgent and routine care needs. 3. Service will only be billed when office clinical staff spend 20 minutes or more in a month to coordinate care. 4. Only one practitioner may furnish and bill the service in a calendar month. 5.The patient may stop CCM services at any time (effective at the end of the month) by phone call to the office staff. 6. The patient will be responsible for cost sharing (co-pay) of up to 20% of the service fee (after annual deductible is met). Patient agreed to services and consent obtained.  Patient Care Team: [name], MD as PCP - General (Family Medicine) Burnell Blanks, MD as PCP - Cardiology (Cardiology) Burnell Blanks, MD (Cardiology) Kathrynn Ducking, MD (Inactive) as Consulting Physician (Neurology) Viona Gilmore, [facility] as Pharmacist (Pharmacist)  Recent office visits: 05/16/2022 Carolann Littler MD: Patient presented for welcome to Medicare visit. Discontinued Venlafaxine and Zonisamide. No follow up noted.    05/08/2022 [name] Decreased Rosuvastatin to 20 mg daily. No follow up noted.    03/30/2022 [name] Increased Rosuvastatin to 40 mg. Discontinued Benzonatate and Ubrogepant. No follow up noted.    12/15/2021 [name] Started olmesartan 20 mg daily. Discontinued clopidogrel and lisinopril. Follow up in 3 months.    11/28/2021 [name], subsequent encounter  and an additional concern. Started Benzonatate. Follow-up immediately for any fever, increased shortness of breath, or other concern.  Recent consult visits: 05/08/2022 [name] Discontinued Zonisamide, Nortriptyline and Venlafaxine. No follow up noted.    03/2022 [name] No medication changes. Follow up in 1 year.    02/12/2022 [name] No additional chart notes.    01/02/2022 [name] No additional chart notes.    12/14/2021 [name] No additional chart notes.  Hospital visits: None in previous 6 months   Objective:  Lab Results  Component Value Date   CREATININE 0.99 03/30/2022   BUN 16 03/30/2022   GFR 79.89  03/30/2022   EGFR 92 10/10/2021   GFRNONAA 97 11/28/2018   GFRAA 112 11/28/2018   NA 138 03/30/2022   K 4.5 03/30/2022   CALCIUM 9.5 03/30/2022   CO2 31 03/30/2022   GLUCOSE 67 (L) 03/30/2022    Lab Results  Component Value Date/Time   HGBA1C 5.8 (H) 10/10/2021 11:05 AM   HGBA1C 5.7 (H) 01/04/2020 03:53 PM   GFR 79.89 03/30/2022 02:23 PM   GFR 90.81 10/04/2020 04:58 PM   MICROALBUR 0.50 06/07/2009 07:18 PM    Last diabetic Eye exam:  Lab Results  Component Value Date/Time   HMDIABEYEEXA No Retinopathy 02/19/2020 12:00 AM    Last diabetic Foot exam: No results found for: "HMDIABFOOTEX"   Lab Results  Component Value Date   CHOL 146 03/30/2022   HDL 53.90 03/30/2022   LDLCALC 64 03/30/2022   TRIG 140.0 03/30/2022   CHOLHDL 3 03/30/2022       Latest Ref Rng & Units 03/30/2022    2:23 PM 10/10/2021   11:05 AM 10/04/2020    4:58 PM  Hepatic Function  Total Protein 6.0 - 8.3 g/dL 7.1  6.6  6.8   Albumin 3.5 - 5.2 g/dL 4.4  4.4  4.1   AST 0 - 37 U/L _0 ALT 0 - 53 U/L _1 Alk Phosphatase 39 - 117 U/L 51  52  42   Total Bilirubin 0.2 - 1.2 mg/dL 0.6  0.5  0.5   Bilirubin, Direct 0.0 - 0.3 mg/dL   0.1     Lab Results  Component Value Date/Time   TSH 0.781 10/10/2021 11:05 AM   TSH 0.57 10/24/2016 10:09 AM       Latest Ref Rng & Units 10/10/2021   11:05 AM 01/12/2021   10:33 AM 11/28/2018    1:15 PM  CBC  WBC 3.4 - 10.8 x10E3/uL 5.8  6.6  5.9   Hemoglobin 13.0 - 17.7 g/dL 14.8  14.3  14.5   Hematocrit 37.5 - 51.0 % 43.3  42.2  42.6   Platelets 150 - 450 x10E3/uL 246  266  263     Lab Results  Component Value Date/Time   VITAMINB12 390 10/10/2021 11:05 AM   VITAMINB12 467 05/03/2016 09:22 AM    Clinical ASCVD: Yes  The ASCVD Risk score (Arnett DK, et al., 2019) failed to calculate for the following reasons:   The patient has a prior MI or stroke diagnosis       05/24/2021   11:40 AM 11/20/2017    8:02 AM 06/24/2017    8:17 AM  Depression  screen PHQ 2/9  Decreased Interest 0 0 0  Down, Depressed, Hopeless 0 0 0  PHQ - 2 Score 0 0 0     Social History   Tobacco Use  Smoking Status Former   Years: 2.00   Types: Cigarettes   Quit date: 06/19/1998   Years since quitting: 23.9  Smokeless Tobacco Never   BP Readings from Last 3 Encounters:  06/01/22 122/66  05/16/22 106/60  05/08/22 108/60   Pulse Readings from Last 3 Encounters:  05/16/22 77  05/08/22 73  03/30/22 70   Wt Readings from Last 3 Encounters:  05/16/22 144 lb 6.4 oz (65.5 kg)  05/08/22 145 lb 1.6 oz (65.8 kg)  03/30/22 140 lb 9.6  oz (63.8 kg)   BMI Readings from Last 3 Encounters:  05/16/22 21.96 kg/m  05/08/22 22.06 kg/m  03/30/22 21.38 kg/m    Assessment/Interventions: Review of patient past medical history, allergies, medications, health status, including review of consultants reports, laboratory and other test data, was performed as part of comprehensive evaluation and provision of chronic care management services.   SDOH:  (Social Determinants of Health) assessments and interventions performed: Yes SDOH Interventions    Flowsheet Row Chronic Care Management from 06/01/2022 in [facility] [place]  SDOH Interventions   Transportation Interventions Intervention Not Indicated  Financial Strain Interventions Intervention Not Indicated      SDOH Screenings   Transportation Needs: No Transportation Needs (06/01/2022)  Depression (PHQ2-9): Low Risk  (05/24/2021)  Financial Resource Strain: Low Risk  (06/01/2022)  Tobacco Use: Medium Risk (05/16/2022)    CCM Care Plan  Allergies  Allergen Reactions   Amlodipine     Chest pain    Lisinopril Cough    Medications Reviewed Today     Reviewed by Viona Gilmore, [facility] (Pharmacist) on 06/04/22 at 41  Med List Status: <None>   Medication Order Taking? Sig Documenting Provider Last Dose Status Informant  aspirin EC 81 MG tablet [id number] Yes Take 1 tablet (81 mg total)  by mouth daily. Swallow whole. Burnell Blanks, MD Taking Active   carvedilol (COREG) 3.125 MG tablet [id number] Yes Take 1 tablet (3.125 mg total) by mouth 2 (two) times daily with a meal. Burnell Blanks, MD Taking Active   Cholecalciferol (VITAMIN D3) 50 MCG (2000 UT) CAPS [id number] Yes Take 1 tablet by mouth daily. [provider]  Active   Cyanocobalamin (VITAMIN B-12 CR PO) [id number] Yes Take 500 mcg by mouth daily. [provider]  Active   Erenumab-aooe (AIMOVIG) 70 MG/ML SOAJ [id number] Yes Inject 70 mg into the skin every 30 (thirty) days. [provider] Taking Active   Multiple Vitamin (MULTIVITAMIN) tablet [id number] Yes Take 1 tablet by mouth daily. [provider] Taking Active   nitroGLYCERIN (NITROSTAT) 0.4 MG SL tablet [id number] Yes Place 1 tablet (0.4 mg total) under the tongue every 5 (five) minutes as needed for chest pain (up to 3 doses). Burnell Blanks, MD Taking Active   olmesartan (BENICAR) 20 MG tablet [id number] Yes Take 1 tablet (20 mg total) by mouth daily. [name], MD Taking Active   ranolazine (RANEXA) 500 MG 12 hr tablet [id number] Yes Take 1 tablet (500 mg total) by mouth 2 (two) times daily. Burnell Blanks, MD Taking Active   Rimegepant Sulfate (NURTEC) 75 MG TBDP [id number] Yes Take 1 tablet by mouth as needed. [provider] Taking Active   rosuvastatin (CRESTOR) 20 MG tablet [id number] Yes Take 1 tablet (20 mg total) by mouth daily. [name], MD Taking Active   venlafaxine [facility]) 75 MG tablet [id number] Yes Take 75 mg by mouth daily. [provider] Taking Active   VITAMIN C, CALCIUM ASCORBATE, PO [id number] Yes Take 250 mg by mouth daily. 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 09/03/2019   Spinal stenosis of  cervical region 09/03/2019   Ulnar neuropathy at elbow, right 09/03/2019   Weakness of right upper extremity 09/03/2019   Recurrent right inguinal hernia 06/17/2019   Incomplete rotator cuff tear or rupture of right shoulder, not specified as traumatic 79/07/4095   Chronic systolic CHF (congestive heart failure) ([place]) 11/25/2018   Visit for occupational health examination 02/27/2018   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 07/05/2017   Left carotid artery stenosis 10/16/2016   Carotid artery disease ([place]) 10/05/2016   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   Diplopia 05/03/2016   Trochlear neuropathy, 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 He has not yet started the [place] and was worried about starting it. He reported they gave him some instruction at the pharmacy but he thinks he already forgot some of it. Went through directions with him again in office.  Patient reports he follows a pretty healthy diet overall. He eats a lot of salads and mostly at home, with rare takeout and never has fast food. He also walks his dog 4-5 days per week for 45 - 60 minutes at a time. When he  was at his least healthy, he was drinking Pepsi regularly and has quit that completely.  Patient brought his BP cuff to the office and his cuff on the left arm was 132/69 (left arm) and then repeated  134/65 and with the office cuff it was 122/66 (left arm).   Conditions to be addressed/monitored:  Hypertension, Hyperlipidemia, Coronary Artery Disease, and Pre-diabetes  Care Plan : [place]  Updates made by Viona Gilmore, [place] since 06/04/2022 12:00 AM     Problem: Problem: Hypertension, Hyperlipidemia, Coronary Artery Disease, and Pre-diabetes      Long-Range Goal: Patient-Specific Goal   Start Date: 06/01/2022  Expected End Date: 06/02/2023  This Visit's Progress: On track  Priority: High  Note:   Current Barriers:  Unable to independently monitor therapeutic efficacy Unable to achieve control of cholesterol   Pharmacist Clinical Goal(s):  Patient will achieve adherence to monitoring guidelines and medication adherence to achieve therapeutic efficacy achieve control of cholesterol as evidenced by next lipid panel  through collaboration with PharmD and provider.   Interventions: 1:1 collaboration with [name], MD regarding development and update of comprehensive plan of care as evidenced by provider attestation and co-signature Inter-disciplinary care team collaboration (see longitudinal plan of care) Comprehensive medication review performed; medication list updated in electronic medical record  Hypertension (BP goal <130/80) -Controlled -Current treatment: Carvedilol 3.125 mg 1 tablet twice daily - Appropriate, Effective, Safe, Accessible Olmesartan 20 mg 1 tablet daily - Appropriate, Effective, Safe, Accessible -Medications previously tried: n/a  -Current home readings: checking once a week and more often with headaches; 112/67 (usually 127/71) - has brought it into office before; checks in the evening -Current dietary habits: eats a lot of  salads and fresh fruit; not eating out often; no fast food -Current exercise habits: walking often -Denies hypotensive/hypertensive symptoms -Educated on BP goals and benefits  of medications for prevention of heart attack, stroke and kidney damage; Importance of home blood pressure monitoring; Proper BP monitoring technique; Symptoms of hypotension and importance of maintaining adequate hydration; -Counseled to monitor BP at home weekly, document, and provide log at future appointments -Counseled on diet and exercise extensively Recommended to continue current medication  Hyperlipidemia: (LDL goal < 55 per cardiology) -Not ideally controlled -Current treatment: Rosuvastatin 20 mg 1 tablet daily - Appropriate, Query effective, Safe, Accessible -Medications previously tried: rosuvastatin 40 mg (dry mouth)  -Current dietary patterns: salad 7 days a week and no fried foods -Current exercise habits: walking almost daily -Educated on Cholesterol goals;  Benefits of statin for ASCVD risk reduction; Importance of limiting foods high in cholesterol; Exercise goal of 150 minutes per week; -Counseled on diet and exercise extensively Recommended to continue current medication Recommended addition of Zetia or PCSK9 and patient preferred PCKS9.  Pre-diabetes (A1c goal <6.5%) -Controlled -Current medications: No medications -Medications previously tried: none  -Current home glucose readings fasting glucose: does not check post prandial glucose: does not check -Denies hypoglycemic/hyperglycemic symptoms -Current meal patterns:  breakfast: n/a  lunch: n/a  dinner: n/a snacks: n/a drinks: n/a -Current exercise: refer to above -Educated on A1c and blood sugar goals; -Counseled to check feet daily and get yearly eye exams -Counseled on diet and exercise extensively  CAD (Goal: prevent heart events) -Controlled -Current treatment  Aspirin 81 mg 1 tablet daily - Appropriate, Effective,  Safe, Accessible Nitroglycerin 0.4 mg 1 tablet as needed - Appropriate, Effective, Safe, Accessible Ranolazine 500 mg 1 tablet twice daily - Appropriate, Effective, Safe, Accessible -Medications previously tried: n/a  -Recommended to continue current medication Recommended checking expiration date on nitroglycerin.  Migraines (Goal: prevent migraines and lessen severity) -Uncontrolled -Current treatment  Aimovig 70 mg inject once a month - has not started Nurtec 75 mg 1 tablet as needed - Appropriate, Effective, Safe, Accessible -Medications previously tried: Roselyn Meier (not covered by insurance), nortriptyline (ineffective), zonisamide (ineffective)  -Recommended to continue current medication Recommended starting Aimovig.  Mood swings (Goal: minimize symptoms) -Controlled -Current treatment: Venlafaxine ER 75 mg 1 capsule daily - Appropriate, Effective, Safe, Accessible -Medications previously tried/failed: n/a -PHQ9: 0 -GAD7: n/a -Educated on Benefits of medication for symptom control -Recommended to continue current medication   Health Maintenance -Vaccine gaps: shingrix, tetanus -Current therapy:  Vitamin D 2000 units 1 tablet daily Vitamin B12 500 mcg 1 tablet daily Vitamin C 250 mg 1 tablet daily Multivitamin 1 tablet daily -Educated on Cost vs benefit of each product must be carefully weighed by individual consumer -Patient is satisfied with current therapy and denies issues -Recommended looking at vitamin D and making sure it's not > 2000 units per day.  Patient Goals/Self-Care Activities Patient will:  - take medications as prescribed as evidenced by patient report and record review check blood pressure weekly, document, and provide at future appointments target a minimum of 150 minutes of moderate intensity exercise weekly  Follow Up Plan: The care management team will reach out to the patient again over the next 14 days.        Medication Assistance:  Patient  has medicare extra help.  Compliance/Adherence/Medication fill history: Care Gaps: Hep C screening, shingrix, HIV screening, tetanus, COVID booster Last BP - 106/60 on 05/16/2022 Last A1C - 5.8 on 10/10/2021  Star-Rating Drugs: Rosuvastatin 20 mg - last filled 05/09/2022 90 DS at [facility] Olmesartan 20 mg  - last filled 03/19/2022 90 DS at [facility]  Patient's preferred pharmacy  is:  [facility] [address], [place] - [place] [address] Phone: [phone] Fax: [phone]  Uses pill box? No - lines them up Pt endorses 99% compliance  We discussed: Current pharmacy is preferred with insurance plan and patient is satisfied with pharmacy services Patient decided to: Continue current medication management strategy  Care Plan and Follow Up Patient Decision:  Patient agrees to Care Plan and Follow-up.  Plan: The care management team will reach out to the patient again over the next 7 days.  Jeni Salles, PharmD, [place] Pharmacist [place] [facility] [place]

## 2022-06-01 ENCOUNTER — Ambulatory Visit (INDEPENDENT_AMBULATORY_CARE_PROVIDER_SITE_OTHER): Payer: Medicare Other | Admitting: Pharmacist

## 2022-06-01 VITALS — BP 122/66

## 2022-06-01 DIAGNOSIS — I10 Essential (primary) hypertension: Secondary | ICD-10-CM

## 2022-06-01 DIAGNOSIS — E785 Hyperlipidemia, unspecified: Secondary | ICD-10-CM

## 2022-06-01 NOTE — Patient Instructions (Signed)
Look at your vitamin D bottle and make sure it's not more than 2000 IU (50 mcg) per day  Maddie Gaylord Shih, PharmD, [facility] Clinical Pharmacist [facility] [place] [phone]

## 2022-06-03 ENCOUNTER — Encounter: Payer: Self-pay | Admitting: Family Medicine

## 2022-06-04 ENCOUNTER — Other Ambulatory Visit: Payer: Self-pay | Admitting: Family Medicine

## 2022-06-04 MED ORDER — REPATHA SURECLICK 140 MG/ML ~~LOC~~ SOAJ: 140.0000 mg | SUBCUTANEOUS | 11 refills | Status: DC

## 2022-06-05 ENCOUNTER — Other Ambulatory Visit: Payer: Self-pay | Admitting: Family Medicine

## 2022-06-05 ENCOUNTER — Telehealth: Payer: Self-pay | Admitting: Pharmacist

## 2022-06-05 MED ORDER — EZETIMIBE 10 MG PO TABS: 10.0000 mg | ORAL_TABLET | Freq: Every day | ORAL | 3 refills | Status: DC

## 2022-06-05 NOTE — Telephone Encounter (Signed)
Patient call back. Per insurance, Repatha PA was denied and patient was made aware. There is an option for an appeal and did complete this but made patient aware that it still might get rejected and sometimes can take weeks for a response. Patient was ok with this and was on board with adding Zetia 10 mg in the meantime as the appeal may be denied as well.

## 2022-06-05 NOTE — Telephone Encounter (Signed)
Received communication from [facility] that Repatha PA was denied as patient did not meet the requirements.   Called patient to make him aware and discuss trial of Zetia instead but he did not answer. Left voicemail requesting a call back.

## 2022-06-17 DIAGNOSIS — I10 Essential (primary) hypertension: Secondary | ICD-10-CM

## 2022-06-17 DIAGNOSIS — E785 Hyperlipidemia, unspecified: Secondary | ICD-10-CM

## 2022-06-25 ENCOUNTER — Encounter: Payer: Self-pay | Admitting: Cardiovascular Disease

## 2022-06-26 NOTE — Telephone Encounter (Signed)
I called and spoke w the patient.  He will come for appointment tomorrow.  He had a couple episodes over the weekend of a sharp left sided chest pain that was relieved w ntg.  Noted when he was lying in bed.  It was different than his usual angina and so it concerned him.  He does not recall that he ate anything spicy.    No further episodes since the weekend.

## 2022-06-26 NOTE — Progress Notes (Deleted)
No chief complaint on file.  History of Present Illness: 67 yo male with h/o CAD s/p 7V CABG in 2000 at [facility], HTN, hyperlipidemia, carotid artery disease and prior TIA here today for cardiac follow up. He was seen as a new pt in January 2011 with complaints of chest pain. He had been followed from 2000 until 2011 by Dr. [name] in [place]. He has had frequent episodes of chest pain since I met him in 2011. Cardiac cath in 2011 with 5/7 patent bypass grafts. Stress myoview in August 2015 with no ischemia, LVEF=48%. Cardiac cath October 2017 with patent LIMA to LAD, patent SVG to OM which filled all of the Circumflex and occluded RCA with occluded SVG to RCA. The RCA filled from left to right collaterals. Nuclear stress test June 2020 with evidence of prior infarct but overall low risk. Echo June 2020 with LVEF=45-50% with segmental wall motion abnormality. No valve disease. He was seen in the office July 2022 and reported worsened chest pains. Cardiac cath 01/19/21 with 2/5 patent grafts (LIMA to LAD, free RIMA to Circumflex). The RCA is occluded and fills from left to right collaterals. LVEF=40-45%. Echo May 2023 with LVEF=55-60% with akinesis of the basal inferior wall, mild mitral regurgitation. He underwent left carotid endarterectomy in May 2018 following a TIA. His carotid disease is followed in vascular surgery at [facility]. Carotid dopplers 11/02/20 with 40-59% RICA stenosis, 123456 LICA stenosis. He has had dizziness and is seeing Neurology. Brain MRI in 2023 with no acute abnormalities. Cardiac monitor June 2023 with PVCs.   He is here today for follow up. The patient denies any chest pain, dyspnea, palpitations, lower extremity edema, orthopnea, PND, dizziness, near syncope or syncope.   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    Past Surgical History:  Procedure Laterality Date   4v cabg     CARDIAC CATHETERIZATION     CARDIAC CATHETERIZATION N/A 03/23/2016   Procedure: Left Heart Cath and Cors/Grafts Angiography;  Surgeon: Burnell Blanks, MD;  Location: [place] [facility];  Service: Cardiovascular;  Laterality: N/A;   CAROTID ANGIOGRAPHY N/A 10/11/2016   Procedure: Carotid Angiography / Cerebral angiogram;  Surgeon: Conrad Rancho [place], MD;  Location: [place] [facility];  Service: Cardiovascular;  Laterality: N/A;   CAROTID ENDARTERECTOMY     COLONOSCOPY  2017   COLONOSCOPY W/ POLYPECTOMY     CORONARY ARTERY BYPASS GRAFT     ENDARTERECTOMY Left 10/16/2016   Procedure: LEFT CAROTID ENDARTERECTOMY WITH PATCH ANGIOPLASTY;  Surgeon: Conrad , MD;  Location: [facility];  Service: Vascular;  Laterality: Left;   LEFT HEART CATH AND CORS/GRAFTS ANGIOGRAPHY N/A 01/19/2021   Procedure: LEFT HEART CATH AND CORS/GRAFTS ANGIOGRAPHY;  Surgeon: Burnell Blanks, MD;  Location: [place] [facility];  Service: Cardiovascular;  Laterality: N/A;   patent grafts     s/p 7  UPPER GASTROINTESTINAL ENDOSCOPY      Current Outpatient Medications  Medication Sig Dispense Refill   aspirin EC 81 MG tablet Take 1 tablet (81 mg total) by mouth daily. Swallow whole. 90 tablet 3   carvedilol (COREG) 3.125 MG tablet Take 1 tablet (3.125 mg total) by mouth 2 (two) times daily with a meal. 180 tablet 2   Cholecalciferol (VITAMIN D3) 50 MCG (2000 UT) CAPS Take 1 tablet by mouth daily.     Cyanocobalamin (VITAMIN B-12 CR PO) Take 500 mcg by mouth daily.     Erenumab-aooe  (AIMOVIG) 70 MG/ML SOAJ Inject 70 mg into the skin every 30 (thirty) days.     Evolocumab (REPATHA SURECLICK) XX123456 MG/ML SOAJ Inject 140 mg into the skin every 14 (fourteen) days. 2 mL 11   ezetimibe (ZETIA) 10 MG tablet Take 1 tablet (10 mg total) by mouth daily. 90 tablet 3   Multiple Vitamin (MULTIVITAMIN) tablet Take 1 tablet by mouth daily.     nitroGLYCERIN (NITROSTAT) 0.4 MG SL tablet Place 1 tablet (0.4 mg total) under the tongue every 5 (five) minutes as needed for chest pain (up to 3 doses). 25 tablet 3   olmesartan (BENICAR) 20 MG tablet Take 1 tablet by mouth once daily 90 tablet 0   ranolazine (RANEXA) 500 MG 12 hr tablet Take 1 tablet (500 mg total) by mouth 2 (two) times daily. 180 tablet 2   Rimegepant Sulfate (NURTEC) 75 MG TBDP Take 1 tablet by mouth as needed.     rosuvastatin (CRESTOR) 20 MG tablet Take 1 tablet (20 mg total) by mouth daily. 90 tablet 3   venlafaxine (EFFEXOR) 75 MG tablet Take 75 mg by mouth daily.     VITAMIN C, CALCIUM ASCORBATE, PO Take 250 mg by mouth daily.     No current facility-administered medications for this visit.    Allergies  Allergen Reactions   Amlodipine     Chest pain    Lisinopril Cough    Social History   Socioeconomic History   Marital status: Single    Spouse name: Not on file   Number of children: 3   Years of education: AS + 2   Highest education level: Not on file  Occupational History   Occupation: [employer]  Tobacco Use   Smoking status: Former    Years: 2.00    Types: Cigarettes    Quit date: 06/19/1998    Years since quitting: 24.0   Smokeless tobacco: Never  Vaping Use   Vaping Use: Never used  Substance and Sexual Activity   Alcohol use: No   Drug use: No   Sexual activity: Yes  Other Topics Concern   Not on file  Social History Narrative   Lives at home alone   Right-handed   Caffeine: 2 cups per day   Social Determinants of Health   Financial Resource Strain: Low Risk  (06/01/2022)   Overall  Financial Resource Strain (CARDIA)    Difficulty of Paying Living Expenses: Not very hard  Food Insecurity: Not on file  Transportation Needs: No Transportation Needs (06/01/2022)   PRAPARE - Hydrologist (Medical): No    Lack of Transportation (Non-Medical): No  Physical Activity: Not on file  Stress: Not on file  Social Connections: Not on file  Intimate Partner Violence: Not on file    Family History  Problem Relation Age of Onset   Heart failure Mother    Hypertension Mother  Heart disease Mother    Heart failure Father    Hypertension Father    Heart disease Father        before age 53   Hypertension Sister    Hypertension Brother    Heart attack Neg Hx    Stroke Neg Hx     Review of Systems:  As stated in the HPI and otherwise negative.   There were no vitals taken for this visit.  Physical Examination: General: Well developed, well nourished, NAD  HEENT: OP clear, mucus membranes moist  SKIN: warm, dry. No rashes. Neuro: No focal deficits  Musculoskeletal: Muscle strength 5/5 all ext  Psychiatric: Mood and affect normal  Neck: No JVD, no carotid bruits, no thyromegaly, no lymphadenopathy.  Lungs:Clear bilaterally, no wheezes, rhonci, crackles Cardiovascular: Regular rate and rhythm. No murmurs, gallops or rubs. Abdomen:Soft. Bowel sounds present. Non-tender.  Extremities: No lower extremity edema. Pulses are 2 + in the bilateral DP/PT.  Echo May 2023:  1. Akinesis of the basal inferior wall with overall normal LV function.   2. Left ventricular ejection fraction, by estimation, is 55 to 60%. The  left ventricle has normal function. The left ventricle demonstrates  regional wall motion abnormalities (see scoring diagram/findings for  description). Left ventricular diastolic  parameters are consistent with Grade I diastolic dysfunction (impaired  relaxation).   3. Right ventricular systolic function is normal. The right  ventricular  size is normal.   4. Left atrial size was mildly dilated.   5. The mitral valve is normal in structure. Mild mitral valve  regurgitation. No evidence of mitral stenosis. There is mild late systolic  prolapse of the medial segment of the anterior leaflet of the mitral  valve.   6. The aortic valve is tricuspid. Aortic valve regurgitation is not  visualized. Aortic valve sclerosis is present, with no evidence of aortic  valve stenosis.   7. Aortic dilatation noted. There is borderline dilatation of the aortic  root, measuring 38 mm.   8. The inferior vena cava is normal in size with greater than 50%  respiratory variability, suggesting right atrial pressure of 3 mmHg.   EKG:  EKG is *** ordered today. The EKG demonstrates   Recent Labs: 10/10/2021: Hemoglobin 14.8; Platelets 246; TSH 0.781 03/30/2022: ALT 23; BUN 16; Creatinine, Ser 0.99; Magnesium 2.1; Potassium 4.5; Sodium 138   Lipid Panel    Component Value Date/Time   CHOL 146 03/30/2022 1423   CHOL 137 01/29/2019 0818   TRIG 140.0 03/30/2022 1423   HDL 53.90 03/30/2022 1423   HDL 50 01/29/2019 0818   CHOLHDL 3 03/30/2022 1423   VLDL 28.0 03/30/2022 1423   LDLCALC 64 03/30/2022 1423   LDLCALC 67 01/04/2020 1553     Wt Readings from Last 3 Encounters:  05/16/22 65.5 kg  05/08/22 65.8 kg  03/30/22 63.8 kg    Assessment and Plan:   1. CAD s/p CABG with stable angina: He has ongoing chest pain. CAD stable by cardiac cath in 2022. Cardiac cath 01/19/21 with 2/5 patent grafts, collateral filling of the occluded RCA. Will continue Plavix, statin, Ranexa and Coreg. He did not tolerate Norvasc.   2. HTN: BP is controlled. No changes  3. HLD: LDL 64 in October 2023. Continue statin.   4. Carotid artery disease: s/p left CEA in 2018. Followed in Vascular surgery at [facility].  Carotid artery dopplers stable may 2023.   5. Dizziness: *** plan from here ***  His symptoms sound like  vertigo. His LV function is normal by  echo this month. No significant valve disease. EKG with sinus rhythm. Will arrange a 3 day Zio monitor to exclude heart block/arrhythmias.   Labs/ tests ordered today include:  No orders of the defined types were placed in this encounter.  Disposition:   F/U with me in one year.   Signed, [name], MD 06/26/2022 8:03 PM    [address], [place], [place]  [address] Phone: [phone]; Fax: [phone]

## 2022-06-27 ENCOUNTER — Ambulatory Visit: Payer: Medicare Other | Admitting: Cardiovascular Disease

## 2022-07-01 NOTE — Progress Notes (Unsigned)
Cardiology Office Note:    Date:  07/02/2022   ID:  [name] Sosa, DOB 06/08/1956, MRN [id number]  PCP:  [name], MD   [facility] Providers Cardiologist:  [name], MD     Referring MD: [name], MD   Chief Complaint: chest pain  History of Present Illness:    [name] Sosa is a very pleasant 67 y.o. male with a hx of CAD s/p CABG x 7 in 2000 at [place], HTN, HLD, carotid artery disease, TIA  Seen as a new patient January 2011 with complaints of chest pain. He had been followed from 2000 until 2011 by Dr. [name] in [place]. Frequent episodes of chest pain since established with Dr. [name] in 2011.  Cardiac cath 2011 with 5/7 patent bypass grafts.  Stress Myoview in August 2015 with no ischemia, LVEF 48%.  Cardiac cath October 2017 with patent LIMA to LAD, patent SVG to OM which filled all of the circumflex and occluded RCA with occluded SVG to RCA.  The RCA filled from left to right collaterals.  Nuclear stress test June 2020 with evidence of prior infarct but overall low risk.  Echo June 2020 with LVEF 45 to 50% with segmental wall motion abnormality, no valve disease.  Seen July 2022 with worsened chest pains.  Cardiac cath 01/19/2021 with 2/5 patent grafts (LIMA-LAD, free RIMA-Circumflex).  RCA is occluded and fills from left to right collaterals.  Echo May 2023 with LVEF 55 to 60% with akinesis of the basal inferior wall, mild mitral regurgitation.  He underwent left carotid endarterectomy May 2018 following TIA.  His carotid disease is followed in vascular surgery at [facility].  Carotid Dopplers 11/02/2020 with a 4 to 59% R ICA stenosis, 1 to [id number] LICA stenosis.  He has been having dizziness, seen by neurology with no acute finding on brain MRI.  Last cardiology clinic visit was 11/15/2021 with Dr. [name] at which time chronic chest pain was stable. He continued to have dizziness and 3 day Zio patch was ordered to r/o arrhythmia/bradycardia.   Today, he  is here for evaluation of chest pain. Reports he has been doing well until a few weeks ago he noted a change in his normal feeling of angina which he describes as light pain "that feels like a spider crawling on him." Present symptoms are a more intense feeling of 2 fingers a few inches apart pressing into his chest very hard. Discomfort occurring 1-2 times per day, only takes nitroglycerin if symptoms take too long to go away. Typically has about 5 minutes of discomfort  with slow onset and more intense pain after about 30 seconds. Was laying in bed on first 2 occasions, more recently occurring with regular walking. Denies that symptoms are associated with SOB, n/v, or diaphoresis. No palpitations, dyspnea, orthopnea, PND, edema, presyncope, and syncope.   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    Past Surgical History:  Procedure Laterality  Date   4v cabg     CARDIAC CATHETERIZATION     CARDIAC CATHETERIZATION N/A 03/23/2016   Procedure: Left Heart Cath and Cors/Grafts Angiography;  Surgeon: Burnell Blanks, MD;  Location: [place] [facility];  Service: Cardiovascular;  Laterality: N/A;   CAROTID ANGIOGRAPHY N/A 10/11/2016   Procedure: Carotid Angiography / Cerebral angiogram;  Surgeon: Conrad [place], MD;  Location: [place] [facility];  Service: Cardiovascular;  Laterality: N/A;   CAROTID ENDARTERECTOMY     COLONOSCOPY  2017   COLONOSCOPY W/ POLYPECTOMY     CORONARY ARTERY BYPASS GRAFT     ENDARTERECTOMY  Left 10/16/2016   Procedure: LEFT CAROTID ENDARTERECTOMY WITH PATCH ANGIOPLASTY;  Surgeon: Conrad Anton Ruiz, MD;  Location: [place];  Service: Vascular;  Laterality: Left;   LEFT HEART CATH AND CORS/GRAFTS ANGIOGRAPHY N/A 01/19/2021   Procedure: LEFT HEART CATH AND CORS/GRAFTS ANGIOGRAPHY;  Surgeon: Burnell Blanks, MD;  Location: [place] [facility];  Service: Cardiovascular;  Laterality: N/A;   patent grafts     s/p 7     UPPER GASTROINTESTINAL ENDOSCOPY      Current Medications: Current Meds  Medication Sig   aspirin EC 81 MG tablet Take 1 tablet (81 mg total) by mouth daily. Swallow whole.   carvedilol (COREG) 3.125 MG tablet Take 1 tablet (3.125 mg total) by mouth 2 (two) times daily with a meal.   Cholecalciferol (VITAMIN D3) 50 MCG (2000 UT) CAPS Take 1 tablet by mouth daily.   Cyanocobalamin (VITAMIN B-12 CR PO) Take 500 mcg by mouth daily.   Erenumab-aooe (AIMOVIG) 70 MG/ML SOAJ Inject 70 mg into the skin every 30 (thirty) days.   Evolocumab (REPATHA SURECLICK) [id number] MG/ML SOAJ Inject 140 mg into the skin every 14 (fourteen) days.   Multiple Vitamin (MULTIVITAMIN) tablet Take 1 tablet by mouth daily.   nitroGLYCERIN (NITROSTAT) 0.4 MG SL tablet Place 1 tablet (0.4 mg total) under the tongue every 5 (five) minutes as needed for chest pain (up to 3 doses).   olmesartan (BENICAR) 20 MG tablet Take 1 tablet by mouth once daily   Rimegepant Sulfate (NURTEC) 75 MG TBDP Take 1 tablet by mouth as needed.   Ubrogepant 50 MG TABS Take by mouth.   venlafaxine (EFFEXOR) 75 MG tablet Take 75 mg by mouth daily.   VITAMIN C, CALCIUM ASCORBATE, PO Take 250 mg by mouth daily.   [DISCONTINUED] ranolazine (RANEXA) 500 MG 12 hr tablet Take 1 tablet (500 mg total) by mouth 2 (two) times daily.     Allergies:   Amlodipine and Lisinopril   Social History   Socioeconomic History   Marital status: Single    Spouse name: Not on file   Number of children: 3   Years of education: AS + 2   Highest  education level: Not on file  Occupational History   Occupation: [employer]  Tobacco Use   Smoking status: Former    Years: 2.00    Types: Cigarettes    Quit date: 06/19/1998    Years since quitting: 24.0   Smokeless tobacco: Never  Vaping Use   Vaping Use: Never used  Substance and Sexual Activity   Alcohol use: No   Drug use: No   Sexual activity: Yes  Other Topics Concern   Not on file  Social History Narrative   Lives at home alone   Right-handed   Caffeine: 2 cups per day   Social Determinants of Health   Financial Resource Strain: Low  Risk  (06/01/2022)   Overall Financial Resource Strain (CARDIA)    Difficulty of Paying Living Expenses: Not very hard  Food Insecurity: Not on file  Transportation Needs: No Transportation Needs (06/01/2022)   PRAPARE - Administrator, Civil Service (Medical): No    Lack of Transportation (Non-Medical): No  Physical Activity: Not on file  Stress: Not on file  Social Connections: Not on file     Family History: The patient's family history includes Heart disease in his father and mother; Heart failure in his father and mother; Hypertension in his brother, father, mother, and sister. There is no history of Heart attack or Stroke.  ROS:   Please see the history of present illness.    + intermittent chest pain  All other systems reviewed and are negative.  Labs/Other Studies Reviewed:    The following studies were reviewed today:  Cardiac Monitor 12/01/21 Sinus with minimum heart rate of 48 beats per minute.  Rare premature ventricular contractions.     Echo 11/02/20  1. Akinesis of the basal inferior wall with overall normal LV function.   2. Left ventricular ejection fraction, by estimation, is 55 to 60%. The  left ventricle has normal function. The left ventricle demonstrates  regional wall motion abnormalities (see scoring diagram/findings for  description). Left ventricular diastolic  parameters are consistent  with Grade I diastolic dysfunction (impaired  relaxation).   3. Right ventricular systolic function is normal. The right ventricular  size is normal.   4. Left atrial size was mildly dilated.   5. The mitral valve is normal in structure. Mild mitral valve  regurgitation. No evidence of mitral stenosis. There is mild late systolic  prolapse of the medial segment of the anterior leaflet of the mitral  valve.   6. The aortic valve is tricuspid. Aortic valve regurgitation is not  visualized. Aortic valve sclerosis is present, with no evidence of aortic  valve stenosis.   7. Aortic dilatation noted. There is borderline dilatation of the aortic  root, measuring 38 mm.   8. The inferior vena cava is normal in size with greater than 50%  respiratory variability, suggesting right atrial pressure of 3 mmHg.   Comparison(s): EF 45%,severe akinesis of the left ventricular, mid-apical  anterior wall and inferior wall.  Carotid Duplex 11/02/21 Right Carotid: Velocities in the right ICA have increased and are  consistent                with a high end range 40-59% stenosis. Non-hemodynamically                 significant plaque <50% noted in the CCA. The ECA appears  >50%                stenosed.   Left Carotid: Velocities in the left ICA are consistent with a stable  1-39%               stenosis, s/p CEA.   Vertebrals:  Bilateral vertebral arteries demonstrate antegrade flow.  Subclavians: Normal flow hemodynamics were seen in bilateral subclavian               arteries.   *See table(s) above for measurements and observations.  Suggest follow up study in 12 months.   LHC 01/19/21   Ost LM to LM lesion is 60% stenosed.   Prox LAD to Mid LAD lesion is 100% stenosed.   Ost LAD to Prox LAD lesion is 70% stenosed.  Ost Cx to Prox Cx lesion is 100% stenosed.   Prox RCA lesion is 100% stenosed.   Origin to Prox Graft lesion is 100% stenosed.   Origin lesion is 100% stenosed.   Origin to Prox  Graft lesion is 100% stenosed.   Non-stenotic Prox Graft lesion.   LIMA and is normal in caliber.   RIMA and is normal in caliber.   SVG.   SVG.   SVG.   There is mild left ventricular systolic dysfunction.   The left ventricular ejection fraction is 40-45% by visual estimate.   There is no mitral valve regurgitation.     1. Triple vessel CAD s/p 5V CABG with 2/5 patent bypass grafts 2. LAD is occluded mid segment. LIMA graft is patent to the LAD 3. Circumflex is occluded proximally. The free RIMA graft to the first OM is patent and fills the entire Circumflex including all marginal branches. 4. The RCA is chronically occluded proximally. The vein graft to the RCA is known to be occluded and was not injected.  The distal RCA fills from left to right collaterals. 5. Both SVG grafts to Diagonal 1 and Diagonal 2 are known to be occluded and were not injected 6. Moderate systolic LV dysfunction, AJOI=78-67%.   Recommendations: Continue medical management of CAD  Recent Labs: 10/10/2021: Hemoglobin 14.8; Platelets 246; TSH 0.781 03/30/2022: ALT 23; BUN 16; Creatinine, Ser 0.99; Magnesium 2.1; Potassium 4.5; Sodium 138  Recent Lipid Panel    Component Value Date/Time   CHOL 146 03/30/2022 1423   CHOL 137 01/29/2019 0818   TRIG 140.0 03/30/2022 1423   HDL 53.90 03/30/2022 1423   HDL 50 01/29/2019 0818   CHOLHDL 3 03/30/2022 1423   VLDL 28.0 03/30/2022 1423   LDLCALC 64 03/30/2022 1423   LDLCALC 67 01/04/2020 1553     Risk Assessment/Calculations:       Physical Exam:    VS:  BP 98/62   Pulse 70   Ht 5\' 8"  (1.727 m)   Wt 146 lb 9.6 oz (66.5 kg)   SpO2 95%   BMI 22.29 kg/m     Wt Readings from Last 3 Encounters:  07/02/22 146 lb 9.6 oz (66.5 kg)  05/16/22 144 lb 6.4 oz (65.5 kg)  05/08/22 145 lb 1.6 oz (65.8 kg)     GEN:  Well nourished, well developed in no acute distress HEENT: Normal NECK: No JVD; No carotid bruits CARDIAC: RRR, no murmurs, rubs,  gallops RESPIRATORY:  Clear to auscultation without rales, wheezing or rhonchi  ABDOMEN: Soft, non-tender, non-distended MUSCULOSKELETAL:  No edema; No deformity. 2+ pedal pulses, equal bilaterally SKIN: Warm and dry NEUROLOGIC:  Alert and oriented x 3 PSYCHIATRIC:  Normal affect   EKG:  EKG is ordered today.  The ekg ordered today demonstrates normal sinus rhythm at 70 bpm, prior inferior MI, no acute change from previous tracing   Diagnoses:    1. 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 Hyperlipidemia LDL goal <70   3. Essential hypertension   4. Bilateral carotid artery stenosis   5. S/P CABG x 7    Assessment and Plan:     CAD with stable angina: CABG x 7 2000. Stable CAD on most recent cath 01/19/21. Recent change in symptoms of angina with pain in 2 areas of left chest, intense at times but overall he feels symptoms have improved since initial onset. No associated symptoms of n/v, diaphoresis, SOB.  No significant increase in symptoms with exertion. We will increase Ranexa to 1000 mg twice daily and see him back in 1 month. Consideration of nuclear stress test at next office visit if symptoms persist. I asked him to notify us if symptoms worsen prior to next office visit.  Hyperlipidemia LDL goal < 55: LDL 64 on 03/30/22. Stopped rosuvastatin because he thought he should be on Repatha alone. We discussed LDL goal < 55. He had joint pain on rosuvastatin 40 mg, no problems on 20 mg dose. Advised him to resume rosuvastatin 20 mg 3 days weekly to likely achieve LDL goal. We will recheck fasting lipids in 2 months.   Hypertension: BP is well-controlled  Carotid artery disease: Moderate stenosis RICA at 23-76%, mild LICA at 2-83% on duplex 11/02/21. Previous symptom of dizziness has resolved. Recommend recheck of carotid arteries in 1-2 years, sooner if clinically indicated.      Disposition: 1 month with me  Medication  Adjustments/Labs and Tests Ordered: Current medicines are reviewed at length with the patient today.  Concerns regarding medicines are outlined above.  Orders Placed This Encounter  Procedures   EKG 12-Lead   Meds ordered this encounter  Medications   rosuvastatin (CRESTOR) 20 MG tablet    Sig: Take 1 tablet (20 mg total) by mouth 3 (three) times a week.    Dispense:  90 tablet    Refill:  3   ranolazine (RANEXA) 1000 MG SR tablet    Sig: Take 1 tablet (1,000 mg total) by mouth 2 (two) times daily.    Dispense:  180 tablet    Refill:  3    Patient Instructions  Medication Instructions:   RESTART Crestor one tablet by mouth ( 20 mg) three times weekly.  INCREASE Ranexa two tablet by mouth (1000 mg) twice daily.   *If you need a refill on your cardiac medications before your next appointment, please call your pharmacy*  Lab Work:  None ordered.  If you have labs (blood work) drawn today and your tests are completely normal, you will receive your results only by: [place] (if you have MyChart) OR A paper copy in the mail If you have any lab test that is abnormal or we need to change your treatment, we will call you to review the results.  Testing/Procedures:  None   Follow-Up: At [facility], you and your health needs are our priority.  As part of our continuing mission to provide you with exceptional heart care, we have created designated Provider Care Teams.  These Care Teams include your primary Cardiologist (physician) and Advanced Practice Providers (APPs -  Physician Assistants and Nurse Practitioners) who all work together to provide you with the care you need, when you need it.  We recommend signing up for the patient portal called "MyChart".  Sign up information is provided on this After Visit Summary.  MyChart is used to connect with patients for Virtual Visits (Telemedicine).  Patients are able to view lab/test results, encounter notes, upcoming  appointments, etc.  Non-urgent messages can be sent to your provider as well.   To learn more about what you can do with MyChart, go to [email or web address].    Your next appointment:   1 month(s)  Provider:   Christen Bame, NP            Signed, Emmaline Life, NP  07/02/2022 8:44 AM    [place]

## 2022-07-02 ENCOUNTER — Encounter: Payer: Self-pay | Admitting: Nurse Practitioner

## 2022-07-02 ENCOUNTER — Ambulatory Visit: Payer: Medicare Other | Attending: Nurse Practitioner | Admitting: Nurse Practitioner

## 2022-07-02 VITALS — BP 98/62 | HR 70 | Ht 68.0 in | Wt 146.6 lb

## 2022-07-02 DIAGNOSIS — I10 Essential (primary) hypertension: Secondary | ICD-10-CM

## 2022-07-02 DIAGNOSIS — I25758 Atherosclerosis of native coronary artery of transplanted heart with other forms of angina pectoris: Secondary | ICD-10-CM

## 2022-07-02 DIAGNOSIS — I6523 Occlusion and stenosis of bilateral carotid arteries: Secondary | ICD-10-CM

## 2022-07-02 DIAGNOSIS — E785 Hyperlipidemia, unspecified: Secondary | ICD-10-CM

## 2022-07-02 DIAGNOSIS — Z951 Presence of aortocoronary bypass graft: Secondary | ICD-10-CM | POA: Diagnosis not present

## 2022-07-02 MED ORDER — ROSUVASTATIN CALCIUM 20 MG PO TABS: 20.0000 mg | ORAL_TABLET | ORAL | 3 refills | Status: DC

## 2022-07-02 MED ORDER — RANOLAZINE ER 1000 MG PO TB12: 1000.0000 mg | ORAL_TABLET | Freq: Two times a day (BID) | ORAL | 3 refills | Status: DC

## 2022-07-02 NOTE — Patient Instructions (Signed)
Medication Instructions:   RESTART Crestor one tablet by mouth ( 20 mg) three times weekly.  INCREASE Ranexa two tablet by mouth (1000 mg) twice daily.   *If you need a refill on your cardiac medications before your next appointment, please call your pharmacy*  Lab Work:  None ordered.  If you have labs (blood work) drawn today and your tests are completely normal, you will receive your results only by: [place] (if you have MyChart) OR A paper copy in the mail If you have any lab test that is abnormal or we need to change your treatment, we will call you to review the results.  Testing/Procedures:  None   Follow-Up: At [facility], you and your health needs are our priority.  As part of our continuing mission to provide you with exceptional heart care, we have created designated Provider Care Teams.  These Care Teams include your primary Cardiologist (physician) and Advanced Practice Providers (APPs -  Physician Assistants and Nurse Practitioners) who all work together to provide you with the care you need, when you need it.  We recommend signing up for the patient portal called "MyChart".  Sign up information is provided on this After Visit Summary.  MyChart is used to connect with patients for Virtual Visits (Telemedicine).  Patients are able to view lab/test results, encounter notes, upcoming appointments, etc.  Non-urgent messages can be sent to your provider as well.   To learn more about what you can do with MyChart, go to [email or web address].    Your next appointment:   1 month(s)  Provider:   Christen Bame, NP

## 2022-07-06 ENCOUNTER — Encounter: Payer: Self-pay | Admitting: Family Medicine

## 2022-07-13 NOTE — Telephone Encounter (Addendum)
PA initiated and awaiting response from insurance. Patient is aware Key:  [id number]

## 2022-07-13 NOTE — Telephone Encounter (Signed)
Patient checking to see if this was sent to his insurance company

## 2022-07-16 ENCOUNTER — Ambulatory Visit: Payer: Medicare Other | Admitting: Physician Assistant

## 2022-07-16 ENCOUNTER — Telehealth: Payer: Self-pay | Admitting: Cardiovascular Disease

## 2022-07-16 MED ORDER — RANOLAZINE ER 1000 MG PO TB12: 1000.0000 mg | ORAL_TABLET | Freq: Two times a day (BID) | ORAL | 3 refills | Status: DC

## 2022-07-16 NOTE — Telephone Encounter (Signed)
Refill was put in as no print at office visit 07/01/22.  90 day refill for Ranexa has been sent to [facility].

## 2022-07-16 NOTE — Telephone Encounter (Signed)
*  STAT* If patient is at the pharmacy, call can be transferred to refill team.   1. Which medications need to be refilled? (please list name of each medication and dose if known) ranolazine (RANEXA) 1000 MG SR tablet   2. Which pharmacy/location (including street and city if local pharmacy) is medication to be sent to? [place], [place]   3. Do they need a 30 day or 90 day supply? 90 day

## 2022-07-18 NOTE — Telephone Encounter (Signed)
Pharmacy Patient Advocate Encounter  Prior Authorization for Repatha has been approved.    PA# [id number] Effective dates: 07/13/2022 through 01/11/2023

## 2022-07-18 NOTE — Telephone Encounter (Signed)
Patient has been notified

## 2022-07-19 DIAGNOSIS — H25813 Combined forms of age-related cataract, bilateral: Secondary | ICD-10-CM | POA: Diagnosis not present

## 2022-07-30 ENCOUNTER — Encounter: Payer: Self-pay | Admitting: Family Medicine

## 2022-08-06 NOTE — Telephone Encounter (Signed)
Noted  

## 2022-08-07 NOTE — Progress Notes (Signed)
Cardiology Office Note:    Date:  08/10/2022   ID:  [name] Sosa, DOB 01/27/1956, MRN [id number]  PCP:  [name], MD   [facility] Providers Cardiologist:  [name], MD     Referring MD: [name], MD   Chief Complaint: Angina  History of Present Illness:    [name] Sosa is a very pleasant 67 y.o. male with a hx of CAD s/p CABG x 7 in 2000 at [place], HTN, HLD, carotid artery disease, and TIA.   Seen as a new patient January 2011 with complaints of chest pain. He had been followed from 2000 until 2011 by Dr. [name] in [place]. Frequent episodes of chest pain since established with Dr. [name] in 2011.  Cardiac cath 2011 with 5/7 patent bypass grafts.  Stress Myoview in August 2015 with no ischemia, LVEF 48%.  Cardiac cath October 2017 with patent LIMA to LAD, patent SVG to OM which filled all of the circumflex and occluded RCA with occluded SVG to RCA.  The RCA filled from left to right collaterals.  Nuclear stress test June 2020 with evidence of prior infarct but overall low risk.  Echo June 2020 with LVEF 45 to 50% with segmental wall motion abnormality, no valve disease.  Seen July 2022 with worsened chest pains.  Cardiac cath 01/19/2021 with 2/5 patent grafts (LIMA-LAD, free RIMA-Circumflex).  RCA is occluded and fills from left to right collaterals.  Echo May 2023 with LVEF 55 to 60% with akinesis of the basal inferior wall, mild mitral regurgitation.  He underwent left carotid endarterectomy May 2018 following TIA.  His carotid disease is followed in vascular surgery at [facility].  Carotid Dopplers 11/02/2020 with a 4 to 59% R ICA stenosis, 1 to [id number] LICA stenosis.  He has been having dizziness, seen by neurology with no acute finding on brain MRI.  Last cardiology clinic visit was 11/15/2021 with Dr. [name] at which time chronic chest pain was stable. He continued to have dizziness and 3 day Zio patch was ordered to r/o arrhythmia/bradycardia.   Seen in  cardiology clinic by me on 07/02/22 for evaluation of chest pain. Reports he has been doing well until a few weeks ago he noted a change in his normal feeling of angina which he describes as light pain "that feels like a spider crawling on him." Present symptoms are a more intense feeling of 2 fingers a few inches apart pressing into his chest very hard. Discomfort occurring 1-2 times per day, only takes nitroglycerin if symptoms take too long to go away. Typically has about 5 minutes of discomfort  with slow onset and more intense pain after about 30 seconds. Was laying in bed on first 2 occasions, more recently occurring with regular walking. Denies that symptoms are associated with SOB, n/v, or diaphoresis. No palpitations, dyspnea, orthopnea, PND, edema, presyncope, and syncope. Ranexa was increased to 1000 mg twice daily for management of symptoms.  He had stopped rosuvastatin because he thought he would be on Repatha alone. Encouraged him to restart rosuvastatin 20 mg 3 times weekly due to LDL above goal of 55.  Today, he is here for follow-up of angina. Chest pain is better, one episode recently. Worse heart burn since increasing Ranexa. Takes 2-3 TUMS and symptoms improve  Walking for exercise, is retired.   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    Past Surgical History:  Procedure Laterality Date   4v cabg     CARDIAC CATHETERIZATION     CARDIAC CATHETERIZATION N/A 03/23/2016   Procedure: Left Heart Cath and  Cors/Grafts Angiography;  Surgeon: Burnell Blanks, MD;  Location: [place] [facility];  Service: Cardiovascular;  Laterality: N/A;   CAROTID ANGIOGRAPHY N/A 10/11/2016   Procedure: Carotid Angiography / Cerebral angiogram;  Surgeon: Conrad Crescent, MD;  Location: [place] [facility];  Service: Cardiovascular;  Laterality: N/A;   CAROTID ENDARTERECTOMY     COLONOSCOPY  2017   COLONOSCOPY W/ POLYPECTOMY     CORONARY ARTERY BYPASS GRAFT     ENDARTERECTOMY Left 10/16/2016   Procedure: LEFT CAROTID ENDARTERECTOMY WITH PATCH ANGIOPLASTY;  Surgeon: Conrad Bardwell, MD;  Location: [place];  Service: Vascular;  Laterality: Left;   LEFT HEART CATH AND CORS/GRAFTS ANGIOGRAPHY N/A 01/19/2021   Procedure: LEFT HEART CATH AND CORS/GRAFTS ANGIOGRAPHY;  Surgeon: Burnell Blanks, MD;  Location: [place] [facility];  Service: Cardiovascular;  Laterality: N/A;   patent grafts     s/p 7     UPPER GASTROINTESTINAL ENDOSCOPY      Current Medications: Current Meds  Medication Sig   aspirin EC 81 MG tablet Take 1 tablet (81 mg total) by mouth daily. Swallow whole.   carvedilol (COREG) 3.125 MG tablet Take 1 tablet (3.125 mg total) by mouth 2 (two) times daily with a meal.   Cholecalciferol (VITAMIN D3) 50 MCG (2000 UT) CAPS Take 1 tablet by mouth daily.   Cyanocobalamin (VITAMIN B-12 CR PO) Take 500 mcg by mouth daily.   Erenumab-aooe (AIMOVIG) 70 MG/ML SOAJ Inject 70 mg into the skin every 30 (thirty) days.   Evolocumab (REPATHA SURECLICK) [id number] MG/ML SOAJ Inject 140 mg into the skin every 14 (fourteen) days.   ezetimibe (ZETIA) 10 MG tablet Take 1 tablet (10 mg total) by mouth daily.   Multiple Vitamin (MULTIVITAMIN) tablet Take 1 tablet by mouth daily.   nitroGLYCERIN (NITROSTAT) 0.4 MG SL tablet Place 1 tablet (0.4 mg total) under the tongue every 5 (five) minutes as needed for chest pain (up to 3 doses).   olmesartan (BENICAR) 20 MG tablet Take 1 tablet by mouth once daily   pantoprazole (PROTONIX) 40 MG  tablet Take 1 tablet (40 mg total) by mouth as directed.   ranolazine (RANEXA) 1000 MG SR tablet Take 1 tablet (1,000 mg total) by mouth 2 (two) times daily.   Rimegepant Sulfate (NURTEC) 75 MG TBDP Take 1 tablet by mouth as needed.   rosuvastatin (CRESTOR) 20 MG tablet Take 1 tablet (20 mg total) by mouth 3 (three) times a week.   Ubrogepant 50 MG TABS Take by mouth.   venlafaxine XR (EFFEXOR-XR) 75 MG 24 hr capsule Take 75 mg by mouth daily.   VITAMIN C, CALCIUM ASCORBATE, PO Take 250 mg by mouth daily.     Allergies:   Amlodipine and Lisinopril   Social History   Socioeconomic History   Marital status: Single    Spouse name: Not on  file   Number of children: 3   Years of education: AS + 2   Highest education level: Not on file  Occupational History   Occupation: [employer]  Tobacco Use   Smoking status: Former    Years: 2.00    Types: Cigarettes    Quit date: 06/19/1998    Years since quitting: 24.1   Smokeless tobacco: Never  Vaping Use   Vaping Use: Never used  Substance and Sexual Activity   Alcohol use: No   Drug use: No   Sexual activity: Yes  Other Topics Concern   Not on file  Social History Narrative   Lives at home alone   Right-handed   Caffeine: 2 cups per day   Social Determinants of Health   Financial Resource Strain: Low Risk  (06/01/2022)   Overall Financial Resource Strain (CARDIA)    Difficulty of Paying Living Expenses: Not very hard  Food Insecurity: Not on file  Transportation Needs: No Transportation Needs (06/01/2022)   PRAPARE - Hydrologist (Medical): No    Lack of Transportation (Non-Medical): No  Physical Activity: Not on file  Stress: Not on file  Social Connections: Not on file     Family History: The patient's family history includes Heart disease in his father and mother; Heart failure in his father and mother; Hypertension in his brother, father, mother, and sister. There is no history of Heart attack or  Stroke.  ROS:   Please see the history of present illness.    + intermittent chest pain  All other systems reviewed and are negative.  Labs/Other Studies Reviewed:    The following studies were reviewed today:  Cardiac Monitor 12/01/21 Sinus with minimum heart rate of 48 beats per minute.  Rare premature ventricular contractions.     Echo 11/02/20  1. Akinesis of the basal inferior wall with overall normal LV function.   2. Left ventricular ejection fraction, by estimation, is 55 to 60%. The  left ventricle has normal function. The left ventricle demonstrates  regional wall motion abnormalities (see scoring diagram/findings for  description). Left ventricular diastolic  parameters are consistent with Grade I diastolic dysfunction (impaired  relaxation).   3. Right ventricular systolic function is normal. The right ventricular  size is normal.   4. Left atrial size was mildly dilated.   5. The mitral valve is normal in structure. Mild mitral valve  regurgitation. No evidence of mitral stenosis. There is mild late systolic  prolapse of the medial segment of the anterior leaflet of the mitral  valve.   6. The aortic valve is tricuspid. Aortic valve regurgitation is not  visualized. Aortic valve sclerosis is present, with no evidence of aortic  valve stenosis.   7. Aortic dilatation noted. There is borderline dilatation of the aortic  root, measuring 38 mm.   8. The inferior vena cava is normal in size with greater than 50%  respiratory variability, suggesting right atrial pressure of 3 mmHg.   Comparison(s): EF 45%,severe akinesis of the left ventricular, mid-apical  anterior wall and inferior wall.  Carotid Duplex 11/02/21 Right Carotid: Velocities in the right ICA have increased and are  consistent                with a high end range 40-59% stenosis. Non-hemodynamically                 significant plaque <50% noted in the CCA. The ECA appears  >50%  stenosed.   Left Carotid: Velocities in the left ICA are consistent with a stable  1-39%               stenosis, s/p CEA.   Vertebrals:  Bilateral vertebral arteries demonstrate antegrade flow.  Subclavians: Normal flow hemodynamics were seen in bilateral subclavian               arteries.   *See table(s) above for measurements and observations.  Suggest follow up study in 12 months.   LHC 01/19/21   Ost LM to LM lesion is 60% stenosed.   Prox LAD to Mid LAD lesion is 100% stenosed.   Ost LAD to Prox LAD lesion is 70% stenosed.   Ost Cx to Prox Cx lesion is 100% stenosed.   Prox RCA lesion is 100% stenosed.   Origin to Prox Graft lesion is 100% stenosed.   Origin lesion is 100% stenosed.   Origin to Prox Graft lesion is 100% stenosed.   Non-stenotic Prox Graft lesion.   LIMA and is normal in caliber.   RIMA and is normal in caliber.   SVG.   SVG.   SVG.   There is mild left ventricular systolic dysfunction.   The left ventricular ejection fraction is 40-45% by visual estimate.   There is no mitral valve regurgitation.     1. Triple vessel CAD s/p 5V CABG with 2/5 patent bypass grafts 2. LAD is occluded mid segment. LIMA graft is patent to the LAD 3. Circumflex is occluded proximally. The free RIMA graft to the first OM is patent and fills the entire Circumflex including all marginal branches. 4. The RCA is chronically occluded proximally. The vein graft to the RCA is known to be occluded and was not injected.  The distal RCA fills from left to right collaterals. 5. Both SVG grafts to Diagonal 1 and Diagonal 2 are known to be occluded and were not injected 6. Moderate systolic LV dysfunction, [id number].   Recommendations: Continue medical management of CAD  Recent Labs: 10/10/2021: Hemoglobin 14.8; Platelets 246; TSH 0.781 03/30/2022: ALT 23; BUN 16; Creatinine, Ser 0.99; Magnesium 2.1; Potassium 4.5; Sodium 138  Recent Lipid Panel    Component Value Date/Time   CHOL 146  03/30/2022 1423   CHOL 137 01/29/2019 0818   TRIG 140.0 03/30/2022 1423   HDL 53.90 03/30/2022 1423   HDL 50 01/29/2019 0818   CHOLHDL 3 03/30/2022 1423   VLDL 28.0 03/30/2022 1423   LDLCALC 64 03/30/2022 1423   LDLCALC 67 01/04/2020 1553     Risk Assessment/Calculations:       Physical Exam:    VS:  BP 120/64   Pulse 63   Ht 5' 7.5" (1.715 m)   Wt 140 lb (63.5 kg)   SpO2 97%   BMI 21.60 kg/m     Wt Readings from Last 3 Encounters:  08/10/22 140 lb (63.5 kg)  07/02/22 146 lb 9.6 oz (66.5 kg)  05/16/22 144 lb 6.4 oz (65.5 kg)     GEN:  Well nourished, well developed in no acute distress HEENT: Normal NECK: No JVD; No carotid bruits CARDIAC: RRR, no murmurs, rubs, gallops RESPIRATORY:  Clear to auscultation without rales, wheezing or rhonchi  ABDOMEN: Soft, non-tender, non-distended MUSCULOSKELETAL:  No edema; No deformity. 2+ pedal pulses, equal bilaterally SKIN: Warm and dry NEUROLOGIC:  Alert and oriented x 3 PSYCHIATRIC:  Normal affect   EKG:  EKG is not ordered today   Diagnoses:    1.  Coronary artery disease of native artery of native heart with stable angina pectoris ([place])   2. Hyperlipidemia LDL goal <70   3. S/P CABG x 7   4. Essential hypertension   5. Bilateral carotid artery stenosis   6. Heartburn     Assessment and Plan:     Heartburn: Symptoms of heart burn have increased on higher dose of Ranexa. Symptoms different from angina. Improvement with TUMS. He would like to try pantoprazole 40 mg daily. Advised him to take daily x 30 days, then every other day and gradually wean off by the end of the 90 days.   CAD with stable angina: CABG x 7 2000. Stable CAD on most recent cath 01/19/21. Feels that angina has improved "90%" on higher dose of Ranexa. No significant increase in symptoms with exertion, no associated symptoms of SOB, n/v, diaphoresis. Has not taken sl ntg. Discussed potential cardiac PET in the future if symptoms worsen. Will have him  return in 6 months to see Dr. [name]. Continue heart healthy diet and 150 minutes of moderate intensity exercise each week. No bleeding concerns. Continue GDMT including asa, rosuvastatin, Repatha, carvedilol, ranolazine.   Hyperlipidemia LDL goal < 55: LDL 64 on 03/30/22. Had previously stopped rosuvastatin. Has resumed rosuvastatin 20 mg 3 days per week. Reports PCP will check labs at next office visit. Continue ezetimibe, Repatha, rosuvastatin.   Hypertension: BP is well-controlled.  Carotid artery disease: Carotid duplex 10/2021 with moderate stenosis RICA at 123456, mild LICA at 123456. He is asymptomatic. Recommend recheck of carotid arteries in 1-2 years, sooner if clinically indicated. Working on LDL goal < 55. Continue rosuvastatin, Repatha.     Disposition: 6 months with Dr. [name]  Medication Adjustments/Labs and Tests Ordered: Current medicines are reviewed at length with the patient today.  Concerns regarding medicines are outlined above.  No orders of the defined types were placed in this encounter.  Meds ordered this encounter  Medications   pantoprazole (PROTONIX) 40 MG tablet    Sig: Take 1 tablet (40 mg total) by mouth as directed.    Dispense:  90 tablet    Refill:  0    Patient Instructions  Medication Instructions:   START Protonix one (1) tablet by mouth ( 40 mg) X 2 weeks than cut back to one (1) tablet (40 mg) every other day, than decrease one (1) tablet a few times weekly than STOP.  *If you need a refill on your cardiac medications before your next appointment, please call your pharmacy*   Lab Work:  None ordered.  If you have labs (blood work) drawn today and your tests are completely normal, you will receive your results only by: [place] (if you have MyChart) OR A paper copy in the mail If you have any lab test that is abnormal or we need to change your treatment, we will call you to review the results.   Testing/Procedures:  None  ordered.   Follow-Up: At [facility], you and your health needs are our priority.  As part of our continuing mission to provide you with exceptional heart care, we have created designated Provider Care Teams.  These Care Teams include your primary Cardiologist (physician) and Advanced Practice Providers (APPs -  Physician Assistants and Nurse Practitioners) who all work together to provide you with the care you need, when you need it.  We recommend signing up for the patient portal called "MyChart".  Sign up information is provided on this After Visit  Summary.  MyChart is used to connect with patients for Virtual Visits (Telemedicine).  Patients are able to view lab/test results, encounter notes, upcoming appointments, etc.  Non-urgent messages can be sent to your provider as well.   To learn more about what you can do with MyChart, go to [email or web address].    Your next appointment:   6 month(s)  Provider:   [name], MD     Other Instructions  Your physician wants you to follow-up in: 6 months with Dr. [name].  You will receive a reminder letter in the mail two months in advance. If you don't receive a letter, please call our office to schedule the follow-up appointment.     Signed, Emmaline Life, NP  08/10/2022 12:55 PM    [place]

## 2022-08-10 ENCOUNTER — Ambulatory Visit: Payer: Medicare Other | Attending: Nurse Practitioner | Admitting: Nurse Practitioner

## 2022-08-10 ENCOUNTER — Encounter: Payer: Self-pay | Admitting: Nurse Practitioner

## 2022-08-10 VITALS — BP 120/64 | HR 63 | Ht 67.5 in | Wt 140.0 lb

## 2022-08-10 DIAGNOSIS — Z951 Presence of aortocoronary bypass graft: Secondary | ICD-10-CM

## 2022-08-10 DIAGNOSIS — I25118 Atherosclerotic heart disease of native coronary artery with other forms of angina pectoris: Secondary | ICD-10-CM

## 2022-08-10 DIAGNOSIS — I6523 Occlusion and stenosis of bilateral carotid arteries: Secondary | ICD-10-CM | POA: Diagnosis not present

## 2022-08-10 DIAGNOSIS — E785 Hyperlipidemia, unspecified: Secondary | ICD-10-CM

## 2022-08-10 DIAGNOSIS — I10 Essential (primary) hypertension: Secondary | ICD-10-CM | POA: Diagnosis not present

## 2022-08-10 DIAGNOSIS — R12 Heartburn: Secondary | ICD-10-CM

## 2022-08-10 MED ORDER — PANTOPRAZOLE SODIUM 40 MG PO TBEC: 40.0000 mg | DELAYED_RELEASE_TABLET | ORAL | 0 refills | Status: DC

## 2022-08-10 NOTE — Patient Instructions (Signed)
Medication Instructions:   START Protonix one (1) tablet by mouth ( 40 mg) X 2 weeks than cut back to one (1) tablet (40 mg) every other day, than decrease one (1) tablet a few times weekly than STOP.  *If you need a refill on your cardiac medications before your next appointment, please call your pharmacy*   Lab Work:  None ordered.  If you have labs (blood work) drawn today and your tests are completely normal, you will receive your results only by: [place] (if you have MyChart) OR A paper copy in the mail If you have any lab test that is abnormal or we need to change your treatment, we will call you to review the results.   Testing/Procedures:  None ordered.   Follow-Up: At [facility], you and your health needs are our priority.  As part of our continuing mission to provide you with exceptional heart care, we have created designated Provider Care Teams.  These Care Teams include your primary Cardiologist (physician) and Advanced Practice Providers (APPs -  Physician Assistants and Nurse Practitioners) who all work together to provide you with the care you need, when you need it.  We recommend signing up for the patient portal called "MyChart".  Sign up information is provided on this After Visit Summary.  MyChart is used to connect with patients for Virtual Visits (Telemedicine).  Patients are able to view lab/test results, encounter notes, upcoming appointments, etc.  Non-urgent messages can be sent to your provider as well.   To learn more about what you can do with MyChart, go to [email or web address].    Your next appointment:   6 month(s)  Provider:   [name], MD     Other Instructions  Your physician wants you to follow-up in: 6 months with Dr. [name].  You will receive a reminder letter in the mail two months in advance. If you don't receive a letter, please call our office to schedule the follow-up appointment.

## 2022-08-28 ENCOUNTER — Ambulatory Visit (INDEPENDENT_AMBULATORY_CARE_PROVIDER_SITE_OTHER): Payer: Medicare Other | Admitting: Family Medicine

## 2022-08-28 ENCOUNTER — Encounter: Payer: Self-pay | Admitting: Family Medicine

## 2022-08-28 VITALS — BP 120/70 | HR 62 | Ht 67.5 in | Wt 143.3 lb

## 2022-08-28 DIAGNOSIS — M79644 Pain in right finger(s): Secondary | ICD-10-CM

## 2022-08-28 DIAGNOSIS — M79645 Pain in left finger(s): Secondary | ICD-10-CM | POA: Diagnosis not present

## 2022-08-28 DIAGNOSIS — R682 Dry mouth, unspecified: Secondary | ICD-10-CM

## 2022-08-28 DIAGNOSIS — I10 Essential (primary) hypertension: Secondary | ICD-10-CM

## 2022-08-28 DIAGNOSIS — E7800 Pure hypercholesterolemia, unspecified: Secondary | ICD-10-CM | POA: Diagnosis not present

## 2022-08-28 NOTE — Progress Notes (Signed)
Established Patient Office Visit  Subjective   Patient ID: [name] Sosa, male    DOB: Oct 24, 1955  Age: 67 y.o. MRN: [id number]  Chief Complaint  Patient presents with   Medical Management of Chronic Issues    HPI   [name] Sosa has history of CAD with CABG at [facility] over 20 years ago, hypertension, peripheral vascular disease, migraine headaches.  He is here to discuss the following issues today  He relates some pain at the tips of his fingers hands bilaterally and somewhat intermittently.  Sometimes noticed slightly purplish discoloration not confined to any specific fingers.  Denies any Raynaud's history.  Had B12 level last spring which was normal.  He takes regular B12 supplement.  Does have history of carpal tunnel but this pain is slightly different.  He has hyperlipidemia and LDL cholesterol is elevated and we started Repatha.  He thought he was supposed to discontinue rosuvastatin completely and was instructed by cardiology start this back every Monday, Wednesday, and Friday.  Needs follow-up lipids.  Denies any recent chest pain  Does complain of some dry mouth just at night.  No polyuria.  Recent glucoses have been normal.  He was concerned this may be related to [place] but did not see this listed as a side effect.  Only other relatively newer medication is Aimovig for migraines.  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   Past Surgical History:  Procedure Laterality Date   4v cabg     CARDIAC  CATHETERIZATION     CARDIAC CATHETERIZATION N/A 03/23/2016   Procedure: Left Heart Cath and Cors/Grafts Angiography;  Surgeon: Burnell Blanks, MD;  Location: [place] [facility];  Service: Cardiovascular;  Laterality: N/A;   CAROTID ANGIOGRAPHY N/A 10/11/2016   Procedure: Carotid Angiography / Cerebral angiogram;  Surgeon: Conrad [place] Shore, MD;  Location: [place] [facility];  Service: Cardiovascular;  Laterality: N/A;   CAROTID ENDARTERECTOMY     COLONOSCOPY  2017   COLONOSCOPY W/ POLYPECTOMY     CORONARY ARTERY BYPASS GRAFT     ENDARTERECTOMY Left 10/16/2016   Procedure: LEFT CAROTID ENDARTERECTOMY WITH PATCH ANGIOPLASTY;  Surgeon: Conrad Ollie, MD;  Location: [place];  Service: Vascular;  Laterality: Left;   LEFT HEART CATH AND CORS/GRAFTS ANGIOGRAPHY N/A 01/19/2021   Procedure: LEFT HEART CATH AND CORS/GRAFTS ANGIOGRAPHY;  Surgeon: Burnell Blanks, MD;  Location: [place] [facility];  Service: Cardiovascular;  Laterality: N/A;   patent grafts     s/p 7     UPPER GASTROINTESTINAL ENDOSCOPY      reports that he quit smoking about 24 years ago. His smoking use included cigarettes. He has never used smokeless tobacco. He reports that he does not drink alcohol and does not use drugs. family history includes Heart disease in his father and mother; Heart failure in his father and mother; Hypertension in his brother, father, mother, and sister. Allergies  Allergen Reactions   Amlodipine  Itching    Chest pain  Chest pain     Chest pain  Chest pain   Chest pain   Lisinopril Cough    Other reaction(s): Cough    Review of Systems  Constitutional:  Negative for malaise/fatigue.  Eyes:  Negative for blurred vision.  Respiratory:  Negative for shortness of breath.   Cardiovascular:  Negative for chest pain.  Gastrointestinal:  Negative for abdominal pain.  Neurological:  Negative for dizziness, weakness and headaches.      Objective:     BP 120/70 (BP Location: Left Arm, Patient  Position: Sitting, Cuff Size: Normal)   Pulse 62   Ht 5' 7.5" (1.715 m)   Wt 143 lb 4.8 oz (65 kg)   SpO2 99%   BMI 22.11 kg/m  BP Readings from Last 3 Encounters:  08/28/22 120/70  08/10/22 120/64  07/02/22 98/62   Wt Readings from Last 3 Encounters:  08/28/22 143 lb 4.8 oz (65 kg)  08/10/22 140 lb (63.5 kg)  07/02/22 146 lb 9.6 oz (66.5 kg)      Physical Exam Vitals reviewed.  Constitutional:      General: He is not in acute distress.    Appearance: He is well-developed.  Eyes:     Pupils: Pupils are equal, round, and reactive to light.  Neck:     Thyroid: No thyromegaly.  Cardiovascular:     Rate and Rhythm: Normal rate and regular rhythm.     Comments: He has good radial pulses bilaterally.  Hands are stained orange from a recent dye he was working with.  Good capillary refill.  Hands are warm to touch. Pulmonary:     Effort: Pulmonary effort is normal. No respiratory distress.     Breath sounds: Normal breath sounds. No wheezing or rales.  Musculoskeletal:     Cervical back: Neck supple.     Right lower leg: No edema.     Left lower leg: No edema.  Neurological:     Mental Status: He is alert and oriented to person, place, and time.      No results found for any visits on 08/28/22.  Last metabolic panel Lab Results  Component Value Date   GLUCOSE 67 (L) 03/30/2022   NA 138 03/30/2022   K 4.5 03/30/2022   CL 102 03/30/2022   CO2 31 03/30/2022   BUN 16 03/30/2022   CREATININE 0.99 03/30/2022   EGFR 92 10/10/2021   CALCIUM 9.5 03/30/2022   PROT 7.1 03/30/2022   ALBUMIN 4.4 03/30/2022   LABGLOB 2.2 10/10/2021   AGRATIO 2.0 10/10/2021   BILITOT 0.6 03/30/2022   ALKPHOS 51 03/30/2022   AST 22 03/30/2022   ALT 23 03/30/2022   ANIONGAP 7 10/17/2016   Last lipids Lab Results  Component Value Date   CHOL 146 03/30/2022   HDL 53.90 03/30/2022   LDLCALC 64 03/30/2022   TRIG 140.0 03/30/2022   CHOLHDL 3 03/30/2022      The ASCVD Risk score  (Arnett DK, et al., 2019) failed to calculate for the following reasons:   The patient has a prior MI or stroke diagnosis    Assessment & Plan:   #1 hyperlipidemia.  Goal LDL less than 55.  Recently initiated on Repatha and taking rosuvastatin 3 times weekly.  Recheck lipid and CMP.  Continue low saturated fat diet  #2 pain involving several fingertips.  Does not report any history of Raynaud's.  No evidence for embolic issues on exam.  Discomfort he  describes sounds almost like reperfusion associated discomfort with Raynaud's.  Doubt neuropathic.  He had recent B12 level which was normal.  Observe for now  #3 hypertension controlled with Benicar and carvedilol  #4 dry mouth.  Symptoms only at night.  Question mouth breathing.  He had questions regarding several medications but does not appear to be associated with Repatha or Aimovig.  Can be seen with Effexor but he has been on this for several years.  Recent blood sugars been stable.   No follow-ups on file.    Carolann Littler, MD

## 2022-08-31 ENCOUNTER — Other Ambulatory Visit (INDEPENDENT_AMBULATORY_CARE_PROVIDER_SITE_OTHER): Payer: Medicare Other

## 2022-08-31 DIAGNOSIS — E7800 Pure hypercholesterolemia, unspecified: Secondary | ICD-10-CM | POA: Diagnosis not present

## 2022-08-31 LAB — COMPREHENSIVE METABOLIC PANEL
ALT: 20 U/L (ref 0–53)
AST: 25 U/L (ref 0–37)
Albumin: 4 g/dL (ref 3.5–5.2)
Alkaline Phosphatase: 44 U/L (ref 39–117)
BUN: 17 mg/dL (ref 6–23)
CO2: 30 mEq/L (ref 19–32)
Calcium: 9.2 mg/dL (ref 8.4–10.5)
Chloride: 101 mEq/L (ref 96–112)
Creatinine, Ser: 0.97 mg/dL (ref 0.40–1.50)
GFR: 81.63 mL/min (ref >60.00)
Glucose, Bld: 93 mg/dL (ref 70–99)
Potassium: 4.4 mEq/L (ref 3.5–5.1)
Sodium: 136 mEq/L (ref 135–145)
Total Bilirubin: 0.7 mg/dL (ref 0.2–1.2)
Total Protein: 6.8 g/dL (ref 6.0–8.3)

## 2022-08-31 LAB — LIPID PANEL
Cholesterol: 127 mg/dL (ref 0–200)
HDL: 52.9 mg/dL (ref >39.00)
LDL Cholesterol: 54 mg/dL (ref 0–99)
NonHDL: 74.01
Total CHOL/HDL Ratio: 2
Triglycerides: 98 mg/dL (ref 0.0–149.0)
VLDL: 19.6 mg/dL (ref 0.0–40.0)

## 2022-09-06 DIAGNOSIS — G43809 Other migraine, not intractable, without status migrainosus: Secondary | ICD-10-CM | POA: Diagnosis not present

## 2022-09-22 ENCOUNTER — Other Ambulatory Visit: Payer: Self-pay | Admitting: Family Medicine

## 2022-09-24 NOTE — Telephone Encounter (Signed)
Pt is out of med and he is aware refill can take up to 3 business days

## 2022-09-25 DIAGNOSIS — Z951 Presence of aortocoronary bypass graft: Secondary | ICD-10-CM | POA: Diagnosis not present

## 2022-09-25 DIAGNOSIS — S50312A Abrasion of left elbow, initial encounter: Secondary | ICD-10-CM | POA: Diagnosis not present

## 2022-09-25 DIAGNOSIS — S0101XA Laceration without foreign body of scalp, initial encounter: Secondary | ICD-10-CM | POA: Diagnosis not present

## 2022-09-25 DIAGNOSIS — Z7982 Long term (current) use of aspirin: Secondary | ICD-10-CM | POA: Diagnosis not present

## 2022-09-25 DIAGNOSIS — S0990XA Unspecified injury of head, initial encounter: Secondary | ICD-10-CM | POA: Diagnosis not present

## 2022-09-25 DIAGNOSIS — I6623 Occlusion and stenosis of bilateral posterior cerebral arteries: Secondary | ICD-10-CM | POA: Diagnosis not present

## 2022-09-25 DIAGNOSIS — I6502 Occlusion and stenosis of left vertebral artery: Secondary | ICD-10-CM | POA: Diagnosis not present

## 2022-09-25 DIAGNOSIS — I672 Cerebral atherosclerosis: Secondary | ICD-10-CM | POA: Diagnosis not present

## 2022-09-25 DIAGNOSIS — R001 Bradycardia, unspecified: Secondary | ICD-10-CM | POA: Diagnosis not present

## 2022-09-25 DIAGNOSIS — I2510 Atherosclerotic heart disease of native coronary artery without angina pectoris: Secondary | ICD-10-CM | POA: Diagnosis not present

## 2022-09-25 DIAGNOSIS — X58XXXA Exposure to other specified factors, initial encounter: Secondary | ICD-10-CM | POA: Diagnosis not present

## 2022-09-25 DIAGNOSIS — I6521 Occlusion and stenosis of right carotid artery: Secondary | ICD-10-CM | POA: Diagnosis not present

## 2022-09-25 DIAGNOSIS — I10 Essential (primary) hypertension: Secondary | ICD-10-CM | POA: Diagnosis not present

## 2022-09-25 DIAGNOSIS — Z87891 Personal history of nicotine dependence: Secondary | ICD-10-CM | POA: Diagnosis not present

## 2022-09-25 DIAGNOSIS — K2289 Other specified disease of esophagus: Secondary | ICD-10-CM | POA: Diagnosis not present

## 2022-09-25 DIAGNOSIS — Y33XXXA Other specified events, undetermined intent, initial encounter: Secondary | ICD-10-CM | POA: Diagnosis not present

## 2022-09-25 DIAGNOSIS — R55 Syncope and collapse: Secondary | ICD-10-CM | POA: Diagnosis not present

## 2022-09-25 DIAGNOSIS — Z23 Encounter for immunization: Secondary | ICD-10-CM | POA: Diagnosis not present

## 2022-09-26 DIAGNOSIS — R55 Syncope and collapse: Secondary | ICD-10-CM | POA: Diagnosis not present

## 2022-09-26 DIAGNOSIS — S0101XA Laceration without foreign body of scalp, initial encounter: Secondary | ICD-10-CM | POA: Diagnosis not present

## 2022-09-26 DIAGNOSIS — K2289 Other specified disease of esophagus: Secondary | ICD-10-CM | POA: Diagnosis not present

## 2022-09-26 DIAGNOSIS — X58XXXA Exposure to other specified factors, initial encounter: Secondary | ICD-10-CM | POA: Diagnosis not present

## 2022-09-27 ENCOUNTER — Telehealth: Payer: Self-pay | Admitting: Cardiovascular Disease

## 2022-09-27 NOTE — Telephone Encounter (Signed)
Pt c/o Syncope: STAT if syncope occurred within 30 minutes and pt complains of lightheadedness High Priority if episode of passing out, completely, today or in last 24 hours   Did you pass out today? No  When is the last time you passed out? Tuesday Morning  Has this occurred multiple times? 3x on Saturday Morning and 1x on Tuesday Morning  Did you have any symptoms prior to passing out? Patient stated there was no warning.  Patient went to the ED on 04/09, per patient they think it has something to do with his heart. Please advise.

## 2022-09-27 NOTE — Telephone Encounter (Signed)
Left voicemail for patient to return call to office. 

## 2022-10-02 NOTE — Progress Notes (Signed)
Cardiology Office Note:    Date:  10/11/2022   ID:  [name] Sosa, DOB [birth year], MRN [id number]  PCP:  Kristian Covey, MD   [facility] Providers Cardiologist:  Verne Carrow, MD     Referring MD: Kristian Covey, MD   Chief Complaint: syncope  History of Present Illness:    [name] Sosa is a very pleasant 67 y.o. male with a hx of CAD s/p CABG x 7 in 2000 at [facility], HTN, HLD, carotid artery disease, and TIA.   Seen as a new patient January 2011 with complaints of chest pain. He had been followed from 2000 until 2011 by Dr. Juliann Pares in [place]. Frequent episodes of chest pain since established with Dr. [name] in 2011.  Cardiac cath 2011 with 5/7 patent bypass grafts.  Stress Myoview in August 2015 with no ischemia, LVEF 48%.  Cardiac cath October 2017 with patent LIMA to LAD, patent SVG to OM which filled all of the circumflex and occluded RCA with occluded SVG to RCA.  The RCA filled from left to right collaterals.  Nuclear stress test June 2020 with evidence of prior infarct but overall low risk.  Echo June 2020 with LVEF 45 to 50% with segmental wall motion abnormality, no valve disease.  Seen July 2022 with worsened chest pains.  Cardiac cath 01/19/2021 with 2/5 patent grafts (LIMA-LAD, free RIMA-Circumflex).  RCA is occluded and fills from left to right collaterals.  Echo May 2023 with LVEF 55 to 60% with akinesis of the basal inferior wall, mild mitral regurgitation.  He underwent left carotid endarterectomy May 2018 following TIA.  His carotid disease is followed in vascular surgery at [facility].  Carotid Dopplers 11/02/2020 with a 4 to 59% R ICA stenosis, 1 to 39% LICA stenosis.  He has been having dizziness, seen by neurology with no acute finding on brain MRI.  Last cardiology clinic visit was 11/15/2021 with Dr. [name] at which time chronic chest pain was stable. He continued to have dizziness and 3 day Zio patch was ordered to r/o arrhythmia/bradycardia.   Seen in  cardiology clinic by me on 07/02/22 for evaluation of chest pain. Reports he has been doing well until a few weeks ago he noted a change in his normal feeling of angina which he describes as light pain "that feels like a spider crawling on him." Present symptoms are a more intense feeling of 2 fingers a few inches apart pressing into his chest very hard. Discomfort occurring 1-2 times per day, only takes nitroglycerin if symptoms take too long to go away. Typically has about 5 minutes of discomfort  with slow onset and more intense pain after about 30 seconds. Was laying in bed on first 2 occasions, more recently occurring with regular walking. Denies that symptoms are associated with SOB, n/v, or diaphoresis. No palpitations, dyspnea, orthopnea, PND, edema, presyncope, and syncope. Ranexa was increased to 1000 mg twice daily for management of symptoms.  He had stopped rosuvastatin because he thought he would be on Repatha alone. Encouraged him to restart rosuvastatin 20 mg 3 times weekly due to LDL above goal of 55.  Seen by me on 08/10/22 for follow-up of angina. Chest pain is better, one episode recently. Worse heart burn since increasing Ranexa. Takes 2-3 TUMS and symptoms improve. Felt these symptoms were not similar to prior angina. We started him on pantoprazole 40 mg daily for 30 days. Walking frequently for exercise, is retired. No significant symptoms of angina with walking. He resumed  rosuvastatin 20 mg 3 days per week. Was advised to return in 6 months for follow-up.   Seen in ED 09/25/22 following three events of syncope occurring when urinating. On first occasion around 5 AM on 09/22/22 he went to use the bathroom to urinate and fell backwards. Feels that he lost consciousness 2 to 3 minutes and woke up on bathroom floor.  He hit his head.  The second occurrence occurred while washing hands about 2 to 3 minutes after first episode. He suddenly felt weakness in his left leg and again lost consciousness  for about 30 to 40 seconds. On third occasion he tried to stand up and fell backwards.  On 09/25/2022 he had another event after urinating when he was watching his hands and required stitches to his head. 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 Lengthy discussion and review of hospital records.  Cardiac monitor was applied, however he left the hospital without the box.  Went back to locate the box and gave the monitor to a staff member.  He is not sure if it was returned.  He is going to follow up with [facility] to see if monitor was located. It was reported to him that he has micturition syncope. Now sitting down to urinate and has had no further episodes of presyncope or syncope. Does not think that BP has been consistently low, but has not monitored consistently. Has had less chest pain on Ranexa 1000 mg twice daily. Says he has not thought about his chest pain as much recently. No recent chest pain, shortness of breath, orthopnea, PND, palpitations, edema.  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    Past Surgical History:  Procedure Laterality Date   4v cabg     CARDIAC CATHETERIZATION     CARDIAC CATHETERIZATION N/A 03/23/2016   Procedure: Left Heart Cath and Cors/Grafts Angiography;  Surgeon: Kathleene Hazel, MD;  Location: [facility] INVASIVE CV LAB;  Service: Cardiovascular;   Laterality: N/A;   CAROTID ANGIOGRAPHY N/A 10/11/2016   Procedure: Carotid Angiography / Cerebral angiogram;  Surgeon: Fransisco Hertz, MD;  Location: [facility] INVASIVE CV LAB;  Service: Cardiovascular;  Laterality: N/A;   CAROTID ENDARTERECTOMY     COLONOSCOPY  2017   COLONOSCOPY W/ POLYPECTOMY     CORONARY ARTERY BYPASS GRAFT     ENDARTERECTOMY Left 10/16/2016   Procedure: LEFT CAROTID ENDARTERECTOMY WITH PATCH ANGIOPLASTY;  Surgeon: Fransisco Hertz, MD;  Location: [facility] OR;  Service: Vascular;  Laterality: Left;   LEFT HEART CATH AND CORS/GRAFTS ANGIOGRAPHY N/A 01/19/2021   Procedure: LEFT HEART CATH AND CORS/GRAFTS ANGIOGRAPHY;  Surgeon: Kathleene Hazel, MD;  Location: [facility] INVASIVE CV LAB;  Service: Cardiovascular;  Laterality: N/A;   patent grafts     s/p 7     UPPER GASTROINTESTINAL ENDOSCOPY  Current Medications: Current Meds  Medication Sig   aspirin EC 81 MG tablet Take 1 tablet (81 mg total) by mouth daily. Swallow whole.   carvedilol (COREG) 3.125 MG tablet Take 1 tablet (3.125 mg total) by mouth 2 (two) times daily with a meal.   Cholecalciferol (VITAMIN D3) 50 MCG (2000 UT) CAPS Take 1 tablet by mouth daily.   Evolocumab (REPATHA SURECLICK) 140 MG/ML SOAJ Inject 140 mg into the skin every 14 (fourteen) days.   ezetimibe (ZETIA) 10 MG tablet Take 1 tablet (10 mg total) by mouth daily.   Multiple Vitamin (MULTIVITAMIN) tablet Take 1 tablet by mouth daily.   nitroGLYCERIN (NITROSTAT) 0.4 MG SL tablet Place 1 tablet (0.4 mg total) under the tongue every 5 (five) minutes as needed for chest pain (up to 3 doses).   pantoprazole (PROTONIX) 40 MG tablet Take 1 tablet (40 mg total) by mouth as directed.   ranolazine (RANEXA) 1000 MG SR tablet Take 1 tablet (1,000 mg total) by mouth 2 (two) times daily.   rosuvastatin (CRESTOR) 20 MG tablet Take 1 tablet (20 mg total) by mouth 3 (three) times a week.   Ubrogepant 50 MG TABS Take by mouth.   venlafaxine XR (EFFEXOR-XR) 75 MG 24 hr capsule Take  75 mg by mouth daily.   VITAMIN C, CALCIUM ASCORBATE, PO Take 250 mg by mouth daily.   [DISCONTINUED] olmesartan (BENICAR) 20 MG tablet Take 1 tablet by mouth once daily     Allergies:   Amlodipine and Lisinopril   Social History   Socioeconomic History   Marital status: Single    Spouse name: Not on file   Number of children: 3   Years of education: AS + 2   Highest education level: Not on file  Occupational History   Occupation: [employer]  Tobacco Use   Smoking status: Former    Years: 2    Types: Cigarettes    Quit date: 06/19/1998    Years since quitting: 24.3   Smokeless tobacco: Never  Vaping Use   Vaping Use: Never used  Substance and Sexual Activity   Alcohol use: No   Drug use: No   Sexual activity: Yes  Other Topics Concern   Not on file  Social History Narrative   Lives at home alone   Right-handed   Caffeine: 2 cups per day   Social Determinants of Health   Financial Resource Strain: Low Risk  (06/01/2022)   Overall Financial Resource Strain (CARDIA)    Difficulty of Paying Living Expenses: Not very hard  Food Insecurity: Not on file  Transportation Needs: No Transportation Needs (06/01/2022)   PRAPARE - Administrator, Civil Service (Medical): No    Lack of Transportation (Non-Medical): No  Physical Activity: Not on file  Stress: Not on file  Social Connections: Not on file     Family History: The patient's family history includes Heart disease in his father and mother; Heart failure in his father and mother; Hypertension in his brother, father, mother, and sister. There is no history of Heart attack or Stroke.  ROS:   Please see the history of present illness.    + intermittent chest pain  All other systems reviewed and are negative.  Labs/Other Studies Reviewed:    The following studies were reviewed today:  Echo 09/26/2022 at [facility] LVEF 50%, normal RV, mild MR, mild PR, trivial TR  CT Thoracic/Abd Aorta 09/26/22 (Care  Everywhere)  Impression:  1. Normal appearance of the  main pulmonary artery.  2.  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  3.  Patulous fluid-filled esophagus with distal wall thickening, which may  represent esophagitis.  CT Head/Neck 09/25/22 (Care Everywhere)  1.  Age-indeterminate critical stenosis versus short segment occlusion of  the V4 segment of left vertebral artery which reconstitutes and terminates  in a patent PICA. This is new when compared to 03/08/2020.  2.  Cranial atherosclerotic disease which includes moderate stenosis of the  left PCA P3 segment and mild stenosis of the right PCA P3 segment, worse  from prior.  3.  60-65% stenosis at the right carotid bulb, similar to prior.  4.  Incompletely visualized apparent wall thickening and irregular luminal  dilatation of the proximal portions of the main pulmonary artery. Recommend  CTA chest angiogram for further evaluation.  Cardiac Monitor 12/01/21 Sinus with minimum heart rate of 48 beats per minute.  Rare premature ventricular contractions.     Echo 11/02/20  1. Akinesis of the basal inferior wall with overall normal LV function.   2. Left ventricular ejection fraction, by estimation, is 55 to 60%. The  left ventricle has normal function. The left ventricle demonstrates  regional wall motion abnormalities (see scoring diagram/findings for  description). Left ventricular diastolic  parameters are consistent with Grade I diastolic dysfunction (impaired  relaxation).   3. Right ventricular systolic function is normal. The right ventricular  size is normal.   4. Left atrial size was mildly dilated.   5. The mitral valve is normal in structure. Mild mitral valve  regurgitation. No evidence of mitral stenosis. There is mild late systolic  prolapse of the medial segment of the anterior leaflet of the mitral  valve.   6. The aortic valve is tricuspid.  Aortic valve regurgitation is not  visualized. Aortic valve sclerosis is present, with no evidence of aortic  valve stenosis.   7. Aortic dilatation noted. There is borderline dilatation of the aortic  root, measuring 38 mm.   8. The inferior vena cava is normal in size with greater than 50%  respiratory variability, suggesting right atrial pressure of 3 mmHg.   Comparison(s): EF 45%,severe akinesis of the left ventricular, mid-apical  anterior wall and inferior wall.  Carotid Duplex 11/02/21 Right Carotid: Velocities in the right ICA have increased and are  consistent                with a high end range 40-59% stenosis. Non-hemodynamically                 significant plaque <50% noted in the CCA. The ECA appears  >50%                stenosed.   Left Carotid: Velocities in the left ICA are consistent with a stable  1-39%               stenosis, s/p CEA.   Vertebrals:  Bilateral vertebral arteries demonstrate antegrade flow.  Subclavians: Normal flow hemodynamics were seen in bilateral subclavian               arteries.   *See table(s) above for measurements and observations.  Suggest follow up study in 12 months.   LHC 01/19/21   Ost LM to LM lesion is 60% stenosed.   Prox LAD to Mid LAD lesion is 100% stenosed.   Ost LAD to Prox LAD lesion is 70% stenosed.  Ost Cx to Prox Cx lesion is 100% stenosed.   Prox RCA lesion is 100% stenosed.   Origin to Prox Graft lesion is 100% stenosed.   Origin lesion is 100% stenosed.   Origin to Prox Graft lesion is 100% stenosed.   Non-stenotic Prox Graft lesion.   LIMA and is normal in caliber.   RIMA and is normal in caliber.   SVG.   SVG.   SVG.   There is mild left ventricular systolic dysfunction.   The left ventricular ejection fraction is 40-45% by visual estimate.   There is no mitral valve regurgitation.     1. Triple vessel CAD s/p 5V CABG with 2/5 patent bypass grafts 2. LAD is occluded mid segment. LIMA graft is patent to  the LAD 3. Circumflex is occluded proximally. The free RIMA graft to the first OM is patent and fills the entire Circumflex including all marginal branches. 4. The RCA is chronically occluded proximally. The vein graft to the RCA is known to be occluded and was not injected.  The distal RCA fills from left to right collaterals. 5. Both SVG grafts to Diagonal 1 and Diagonal 2 are known to be occluded and were not injected 6. Moderate systolic LV dysfunction, LVEF=40-45%.   Recommendations: Continue medical management of CAD  Recent Labs: 03/30/2022: Magnesium 2.1 08/31/2022: ALT 20; BUN 17; Creatinine, Ser 0.97; Potassium 4.4; Sodium 136  Recent Lipid Panel    Component Value Date/Time   CHOL 127 08/31/2022 1009   CHOL 137 01/29/2019 0818   TRIG 98.0 08/31/2022 1009   HDL 52.90 08/31/2022 1009   HDL 50 01/29/2019 0818   CHOLHDL 2 08/31/2022 1009   VLDL 19.6 08/31/2022 1009   LDLCALC 54 08/31/2022 1009   LDLCALC 67 01/04/2020 1553     Risk Assessment/Calculations:       Physical Exam:    VS:  BP 100/66 (BP Location: Left Arm, Patient Position: Sitting, Cuff Size: Normal)   Pulse 67   Ht 5' 7.5" (1.715 m)   Wt 140 lb 12.8 oz (63.9 kg)   SpO2 93%   BMI 21.73 kg/m     Wt Readings from Last 3 Encounters:  10/11/22 140 lb 12.8 oz (63.9 kg)  10/05/22 142 lb (64.4 kg)  08/28/22 143 lb 4.8 oz (65 kg)     GEN:  Well nourished, well developed in no acute distress HEENT: Normal NECK: No JVD; No carotid bruits CARDIAC: RRR, no murmurs, rubs, gallops RESPIRATORY:  Clear to auscultation without rales, wheezing or rhonchi  ABDOMEN: Soft, non-tender, non-distended MUSCULOSKELETAL:  No edema; No deformity. 2+ pedal pulses, equal bilaterally SKIN: Warm and dry NEUROLOGIC:  Alert and oriented x 3 PSYCHIATRIC:  Normal affect   EKG:  EKG is not ordered today   Diagnoses:    1. Syncope and collapse   2. Coronary artery disease of native artery of native heart with stable angina  pectoris   3. Hyperlipidemia LDL goal <70   4. S/P CABG x 7   5. Essential hypertension   6. Bilateral carotid artery stenosis   7. Heart failure with mildly reduced ejection fraction (HFmrEF)      Assessment and Plan:     Syncope and collapse: Was diagnosed with micturition syncope at [facility] 09/26/22.  No further episodes of presyncope or syncope since he has been sitting to urinate. No medication changes were made at discharge. Cardiac monitor was ordered but not completed. He is going to follow-up with [facility]. We can  reorder monitor if he is unable to locate. BP is soft despite not yet taking olmesartan.  We will discontinue olmesartan.  Advised him to notify us if we need to repeat cardiac monitor. Advised good nutrition and hydration. Encouraged him to liberalize sodium and electrolytes to improve hydration and BP.  Keep your upcoming appointment with neurology.  Carotid artery disease: Hx of left carotid endarterectomy 2018 at [facility]. CTA of head and neck at [facility] 09/2022 revealed "age-indeterminate critical stenosis versus short segment occlusion of the V4 segment of left vertebral artery which reconstitutes and terminates in a patent PICA. This is new when compared to 03/08/2020. Cranial atherosclerotic disease which includes moderate stenosis of the left PCA P3 segment and mild stenosis of the right PCA P3 segment, worse from prior." He requests referral to [facility] for management so that he does not have to travel to [facility] [place]. Referral placed.   CAD with stable angina: History of CABG x 7 in 2000. Stable CAD on most recent cath 01/19/21.  He feels that angina is stable on current therapy. No bleeding concerns. Continue GDMT including asa, rosuvastatin, Repatha, carvedilol, ranolazine.   HFmrEF: TTE 09/26/2022 with mildly reduced LVEF 50%. This was done in the setting of follow-up of syncope and collapse at [facility]. He has had no dyspnea, orthopnea, PND, edema.  Appears euvolemic on exam. We are discontinuing  olmesartan 2/2 soft BP and syncope. Encouraged improved nutrition and hydration. Can consider adding back low dose ACEi/ARB if BP improves.   Hyperlipidemia LDL goal < 55: LDL 54 on 08/31/22, excellent. Continue ezetimibe, Repatha, rosuvastatin.   Hypertension: BP is soft.  With recent episodes of syncope, we will have him discontinue olmesartan. Continue to monitor BP as well as symptoms of orthostasis and report any concerns to [place].      Disposition: 6 months with Dr. [name]  Medication Adjustments/Labs and Tests Ordered: Current medicines are reviewed at length with the patient today.  Concerns regarding medicines are outlined above.  Orders Placed This Encounter  Procedures   Ambulatory referral to Vascular Surgery   No orders of the defined types were placed in this encounter.   Patient Instructions  Medication Instructions:   DISCONTINUE Omelsartan.    *If you need a refill on your cardiac medications before your next appointment, please call your pharmacy*   Lab Work:  None ordered.  If you have labs (blood work) drawn today and your tests are completely normal, you will receive your results only by: MyChart Message (if you have MyChart) OR A paper copy in the mail If you have any lab test that is abnormal or we need to change your treatment, we will call you to review the results.   Testing/Procedures:  None ordered.   Follow-Up: At [facility], you and your health needs are our priority.  As part of our continuing mission to provide you with exceptional heart care, we have created designated Provider Care Teams.  These Care Teams include your primary Cardiologist (physician) and Advanced Practice Providers (APPs -  Physician Assistants and Nurse Practitioners) who all work together to provide you with the care you need, when you need it.  We recommend signing up for the patient portal called "MyChart".  Sign up information is provided on this After  Visit Summary.  MyChart is used to connect with patients for Virtual Visits (Telemedicine).  Patients are able to view lab/test results, encounter notes, upcoming appointments, etc.  Non-urgent messages can be sent to your provider  as well.   To learn more about what you can do with MyChart, go to [email or web address].    Your next appointment:   6 month(s)  Provider:   Verne Carrow, MD     Other Instructions  Your physician wants you to follow-up in: 6 months with Dr. [name].  You will receive a reminder letter in the mail two months in advance. If you don't receive a letter, please call our office to schedule the follow-up appointment.  You have been referred to [facility] V[facility]. The office will call you to schedule appointment.   Increase your Healthy sodium as discussed with Marcelino Duster.       Signed, Levi Aland, NP  10/11/2022 5:32 PM    [facility]

## 2022-10-05 ENCOUNTER — Ambulatory Visit (INDEPENDENT_AMBULATORY_CARE_PROVIDER_SITE_OTHER): Payer: Medicare Other | Admitting: Family Medicine

## 2022-10-05 ENCOUNTER — Encounter: Payer: Self-pay | Admitting: Family Medicine

## 2022-10-05 VITALS — BP 112/70 | HR 59 | Temp 97.5°F | Wt 142.0 lb

## 2022-10-05 DIAGNOSIS — I10 Essential (primary) hypertension: Secondary | ICD-10-CM | POA: Diagnosis not present

## 2022-10-05 DIAGNOSIS — I5022 Chronic systolic (congestive) heart failure: Secondary | ICD-10-CM

## 2022-10-05 DIAGNOSIS — S0101XD Laceration without foreign body of scalp, subsequent encounter: Secondary | ICD-10-CM

## 2022-10-05 DIAGNOSIS — R55 Syncope and collapse: Secondary | ICD-10-CM

## 2022-10-05 DIAGNOSIS — I6523 Occlusion and stenosis of bilateral carotid arteries: Secondary | ICD-10-CM

## 2022-10-05 DIAGNOSIS — I25810 Atherosclerosis of coronary artery bypass graft(s) without angina pectoris: Secondary | ICD-10-CM | POA: Diagnosis not present

## 2022-10-05 NOTE — Telephone Encounter (Signed)
Attempted to reach patient. Left message for return call.

## 2022-10-05 NOTE — Progress Notes (Signed)
   Subjective:    Patient ID: [name] Sosa, male    DOB: 02-25-1956, 67 y.o.   MRN: [id number]  HPI Here to follow up a visit to the [facility] ED on 09-25-22. He had 3 episodes of brief LOC and falling at home in the bathroom. His wife called EMS and he was taken to the ED. No chest pain or SOB or dizziness. He has a hx of micturition syncope, and he was evaluated for similar episodes a year ago. In May and June of 2023 he wore a cardiac monitor that showed him to be in NSR even during one of these episodes. He had carotid dopplers revealing a 40-59% stenosis in the right ICA and a 1-39% stenosis in the left ICA. An ECHO showed an EF of 55-60% with some LV wall motion abnormalities. He also had a Grade I diastolic dysfunction. During the ED visit at [facility] he also had an ECHO which showed the same findings. He had CTA of the head and neck which revealed no significant stenoses and no strokes. Another cardiac monitor was placed which he has now turned back in, but no interpretation is available. His regular cardiologist is Dr. [name]. As a result of the falls, he sustained a laceration to the scalp. This was closed with sutures, and he is here today to have these removed. He has felt fine ever since the ED visit.    Review of Systems  Constitutional: Negative.   Respiratory: Negative.    Cardiovascular: Negative.   Gastrointestinal: Negative.   Genitourinary: Negative.   Skin:  Positive for wound.  Neurological:  Positive for syncope and weakness. Negative for dizziness, tremors, seizures, facial asymmetry, speech difficulty, light-headedness, numbness and headaches.       Objective:   Physical Exam Constitutional:      Appearance: Normal appearance.  Cardiovascular:     Rate and Rhythm: Normal rate and regular rhythm.     Pulses: Normal pulses.     Heart sounds: Normal heart sounds.  Pulmonary:     Effort: Pulmonary effort is normal.     Breath sounds: Normal breath sounds.  Skin:     Comments: There are 2 linear lacerations on the scalp vertex. 5 sutures are in place. The wound looks clean   Neurological:     General: No focal deficit present.     Mental Status: He is alert and oriented to person, place, and time.           Assessment & Plan:  After the scalp lacerations, the sutures were removed today. He had several syncopal episodes that were felt to be from micturition syncope. He is scheduled to follow up with Cardiology on 10-11-22. We spent a total of (35   ) minutes reviewing records and discussing these issues.  Gershon Crane, MD

## 2022-10-11 ENCOUNTER — Encounter: Payer: Self-pay | Admitting: Nurse Practitioner

## 2022-10-11 ENCOUNTER — Ambulatory Visit: Payer: Medicare Other | Attending: Nurse Practitioner | Admitting: Nurse Practitioner

## 2022-10-11 VITALS — BP 100/66 | HR 67 | Ht 67.5 in | Wt 140.8 lb

## 2022-10-11 DIAGNOSIS — I25118 Atherosclerotic heart disease of native coronary artery with other forms of angina pectoris: Secondary | ICD-10-CM | POA: Diagnosis not present

## 2022-10-11 DIAGNOSIS — R55 Syncope and collapse: Secondary | ICD-10-CM | POA: Diagnosis not present

## 2022-10-11 DIAGNOSIS — I10 Essential (primary) hypertension: Secondary | ICD-10-CM

## 2022-10-11 DIAGNOSIS — I6523 Occlusion and stenosis of bilateral carotid arteries: Secondary | ICD-10-CM | POA: Diagnosis not present

## 2022-10-11 DIAGNOSIS — I5022 Chronic systolic (congestive) heart failure: Secondary | ICD-10-CM

## 2022-10-11 DIAGNOSIS — Z951 Presence of aortocoronary bypass graft: Secondary | ICD-10-CM

## 2022-10-11 DIAGNOSIS — E785 Hyperlipidemia, unspecified: Secondary | ICD-10-CM | POA: Diagnosis not present

## 2022-10-11 NOTE — Patient Instructions (Signed)
Medication Instructions:   DISCONTINUE Omelsartan.    *If you need a refill on your cardiac medications before your next appointment, please call your pharmacy*   Lab Work:  None ordered.  If you have labs (blood work) drawn today and your tests are completely normal, you will receive your results only by: MyChart Message (if you have MyChart) OR A paper copy in the mail If you have any lab test that is abnormal or we need to change your treatment, we will call you to review the results.   Testing/Procedures:  None ordered.   Follow-Up: At [facility], you and your health needs are our priority.  As part of our continuing mission to provide you with exceptional heart care, we have created designated Provider Care Teams.  These Care Teams include your primary Cardiologist (physician) and Advanced Practice Providers (APPs -  Physician Assistants and Nurse Practitioners) who all work together to provide you with the care you need, when you need it.  We recommend signing up for the patient portal called "MyChart".  Sign up information is provided on this After Visit Summary.  MyChart is used to connect with patients for Virtual Visits (Telemedicine).  Patients are able to view lab/test results, encounter notes, upcoming appointments, etc.  Non-urgent messages can be sent to your provider as well.   To learn more about what you can do with MyChart, go to [email or web address].    Your next appointment:   6 month(s)  Provider:   Verne Carrow, MD     Other Instructions  Your physician wants you to follow-up in: 6 months with Dr. [name].  You will receive a reminder letter in the mail two months in advance. If you don't receive a letter, please call our office to schedule the follow-up appointment.  You have been referred to Vein and Vascular. The office will call you to schedule appointment.   Increase your Healthy sodium as discussed with Marcelino Duster.

## 2022-10-17 ENCOUNTER — Ambulatory Visit (INDEPENDENT_AMBULATORY_CARE_PROVIDER_SITE_OTHER): Payer: Medicare Other | Admitting: Family Medicine

## 2022-10-17 ENCOUNTER — Encounter: Payer: Self-pay | Admitting: Cardiovascular Disease

## 2022-10-17 ENCOUNTER — Encounter: Payer: Self-pay | Admitting: Family Medicine

## 2022-10-17 VITALS — BP 110/62 | HR 61 | Temp 97.8°F | Wt 135.0 lb

## 2022-10-17 DIAGNOSIS — G43909 Migraine, unspecified, not intractable, without status migrainosus: Secondary | ICD-10-CM

## 2022-10-17 DIAGNOSIS — R110 Nausea: Secondary | ICD-10-CM

## 2022-10-17 NOTE — Progress Notes (Signed)
   Subjective:    Patient ID: [name] Sosa, male    DOB: 03-28-1956, 67 y.o.   MRN: [id number]  HPI Here complaining of one week of a dull global headache and nausea without vomiting. No fever or ST or cough or body aches. He has been seeing Dr. Melrose Nakayama at [facility] for his migraines, and 3 months ago he was started on Aimovig shots for prevention. He took shots of 70 mg once a month. This helped at first but then it seemed to lose some effectiveness. The dose was increased to 140 mg, and he took the first shot with this dose one week ago (the same time the headaches and nausea began). We had seen heim recently for a few episodes of micturition syncope, but he has been fine since then and his BP is stable. Part of his workup at that time was CT angiogram of his head that was normal.    Review of Systems  Constitutional: Negative.   Respiratory: Negative.    Cardiovascular: Negative.   Gastrointestinal:  Positive for nausea. Negative for abdominal distention, abdominal pain, blood in stool, constipation, diarrhea and vomiting.  Genitourinary: Negative.   Neurological:  Positive for headaches. Negative for dizziness and light-headedness.       Objective:   Physical Exam Constitutional:      Appearance: Normal appearance. He is not ill-appearing.  Cardiovascular:     Rate and Rhythm: Normal rate and regular rhythm.     Pulses: Normal pulses.     Heart sounds: Normal heart sounds.  Pulmonary:     Effort: Pulmonary effort is normal.     Breath sounds: Normal breath sounds.  Abdominal:     General: Abdomen is flat. Bowel sounds are normal. There is no distension.     Palpations: Abdomen is soft. There is no mass.     Tenderness: There is no abdominal tenderness. There is no guarding or rebound.     Hernia: No hernia is present.  Neurological:     General: No focal deficit present.     Mental Status: He is alert and oriented to person, place, and time.            Assessment & Plan:  His headaches and nausea are likely side effects of the higher dosage of Aimovig. These symptoms should resolve over the next week or two. I advised him to contact Dr. Ron Parker for advice about what to do next.  Gershon Crane, MD

## 2022-10-19 DIAGNOSIS — R55 Syncope and collapse: Secondary | ICD-10-CM | POA: Diagnosis not present

## 2022-10-22 ENCOUNTER — Telehealth: Payer: Self-pay | Admitting: Cardiovascular Disease

## 2022-10-22 NOTE — Telephone Encounter (Signed)
Pt c/o BP issue: STAT if pt c/o blurred vision, one-sided weakness or slurred speech  1. What are your last 5 BP readings?  153/82 65 136/72 71 158/82 61 139/59 74 146/76 66  2. Are you having any other symptoms (ex. Dizziness, headache, blurred vision, passed out)? States he gets headache when his BP goes up.   3. What is your BP issue? Patient states as day goes on his BP goes up.

## 2022-10-24 MED ORDER — OLMESARTAN MEDOXOMIL 20 MG PO TABS: 20.0000 mg | ORAL_TABLET | Freq: Every day | ORAL | 3 refills | Status: DC

## 2022-10-24 NOTE — Telephone Encounter (Signed)
I called the patient.  He has a lot of the olmesartan 20 mg tablets and will start taking them.  He will continue to monitor BP and headaches.    Medication added back to med list.  He has not scheduled the vascular surgery appointment yet.  He wanted to know who Dr. [name] recommends since Dr. Imogene Burn is not there anymore.  I adv that he will be scheduled with the most appropriate provider for his needs and that they are all recommended by Dr. [name].  He still would like to try to see Dr. [name] if possible to review his recent testing and for reassurance.  I explained that I would add him to my wait list as the doctor does not have any openings at this time.

## 2022-10-29 ENCOUNTER — Encounter: Payer: Medicare Other | Admitting: Surgery

## 2022-11-05 NOTE — Progress Notes (Unsigned)
[facility] [place]  ASSESSMENT / PLAN: [name] Sosa is a 67 y.o. male with asymptomatic right 65% carotid artery stenosis. 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  The patient should continue best medical therapy for carotid artery stenosis including: Complete cessation from all tobacco products. Blood glucose control with goal A1c < 7%. Blood pressure control with goal blood pressure < 140/90 mmHg. Lipid reduction therapy with goal LDL-C <100 mg/dL (<09 if symptomatic from carotid artery stenosis).  Aspirin 81mg  PO QD.  Atorvastatin 40-80mg  PO QD (or other "high intensity" statin therapy).  Patient counseled carotid disease very unlikely to cause dizziness or syncope. Follow up with me in 1 year with carotid duplex, AAA duplex.  CHIEF COMPLAINT: Recurrent syncope  HISTORY OF PRESENT ILLNESS: [name] Sosa is a 67 y.o. male who presents to clinic for evaluation of carotid artery stenosis.  The patient reports he has had multiple episodes of syncope over the past several months.  Exhaustive workup has been performed with no cause found to date.  Carotid artery stenosis and right vertebral artery stenosis was demonstrated on recent CT angiogram of his head and neck.  He presents today to discuss these findings as a possible cause for his symptoms.  We reviewed the natural history of carotid artery stenosis.  He specifically denies any typical stroke or mini stroke symptoms attributable to a carotid him.  He specifically denies amaurosis, unilateral weakness or numbness, facial drooping, difficulty speaking.  He has been told that his syncope is vasovagal in origin.  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    Past Surgical History:  Procedure Laterality Date   4v cabg     CARDIAC CATHETERIZATION     CARDIAC CATHETERIZATION N/A 03/23/2016   Procedure: Left Heart Cath and Cors/Grafts Angiography;  Surgeon: Kathleene Hazel, MD;  Location: [facility] INVASIVE CV LAB;  Service: Cardiovascular;  Laterality: N/A;   CAROTID ANGIOGRAPHY N/A 10/11/2016   Procedure: Carotid Angiography / Cerebral angiogram;  Surgeon: Fransisco Hertz, MD;  Location: [facility] INVASIVE CV LAB;  Service: Cardiovascular;  Laterality: N/A;   CAROTID ENDARTERECTOMY     COLONOSCOPY  2017   COLONOSCOPY W/ POLYPECTOMY     CORONARY ARTERY BYPASS GRAFT     ENDARTERECTOMY Left 10/16/2016   Procedure: LEFT CAROTID ENDARTERECTOMY WITH PATCH ANGIOPLASTY;  Surgeon: Fransisco Hertz, MD;  Location: [facility] OR;  Service: Vascular;  Laterality: Left;   LEFT HEART CATH AND CORS/GRAFTS ANGIOGRAPHY N/A 01/19/2021   Procedure: LEFT HEART CATH AND CORS/GRAFTS ANGIOGRAPHY;  Surgeon: Kathleene Hazel, MD;  Location: [facility] INVASIVE CV LAB;  Service: Cardiovascular;  Laterality: N/A;   patent grafts     s/p 7     UPPER GASTROINTESTINAL ENDOSCOPY      Family History  Problem Relation Age of Onset   Heart failure Mother    Hypertension Mother    Heart disease Mother  Heart failure Father    Hypertension Father    Heart disease Father        before age 59   Hypertension Sister    Hypertension Brother    Heart attack Neg Hx    Stroke Neg Hx     Social History   Socioeconomic History   Marital status: Single    Spouse name: Not on file   Number of children: 3   Years of education: AS + 2   Highest education level: Not on file  Occupational History   Occupation: [employer]  Tobacco Use   Smoking  status: Former    Years: 2    Types: Cigarettes    Quit date: 06/19/1998    Years since quitting: 24.4   Smokeless tobacco: Never  Vaping Use   Vaping Use: Never used  Substance and Sexual Activity   Alcohol use: No   Drug use: No   Sexual activity: Yes  Other Topics Concern   Not on file  Social History Narrative   Lives at home alone   Right-handed   Caffeine: 2 cups per day   Social Determinants of Health   Financial Resource Strain: Low Risk  (06/01/2022)   Overall Financial Resource Strain (CARDIA)    Difficulty of Paying Living Expenses: Not very hard  Food Insecurity: Not on file  Transportation Needs: No Transportation Needs (06/01/2022)   PRAPARE - Administrator, Civil Service (Medical): No    Lack of Transportation (Non-Medical): No  Physical Activity: Not on file  Stress: Not on file  Social Connections: Not on file  Intimate Partner Violence: Not on file    Allergies  Allergen Reactions   Amlodipine Itching    Chest pain  Chest pain     Chest pain  Chest pain   Chest pain   Lisinopril Cough    Other reaction(s): Cough    Current Outpatient Medications  Medication Sig Dispense Refill   aspirin EC 81 MG tablet Take 1 tablet (81 mg total) by mouth daily. Swallow whole. 90 tablet 3   carvedilol (COREG) 3.125 MG tablet Take 1 tablet (3.125 mg total) by mouth 2 (two) times daily with a meal. 180 tablet 2   Cholecalciferol (VITAMIN D3) 50 MCG (2000 UT) CAPS Take 1 tablet by mouth daily.     Erenumab-aooe (AIMOVIG) 140 MG/ML SOAJ Inject 70 mg into the skin every 30 (thirty) days.     ezetimibe (ZETIA) 10 MG tablet Take 1 tablet (10 mg total) by mouth daily. 90 tablet 3   Multiple Vitamin (MULTIVITAMIN) tablet Take 1 tablet by mouth daily.     nitroGLYCERIN (NITROSTAT) 0.4 MG SL tablet Place 1 tablet (0.4 mg total) under the tongue every 5 (five) minutes as needed for chest pain (up to 3 doses). 25 tablet 3   olmesartan (BENICAR) 20 MG tablet  Take 1 tablet (20 mg total) by mouth daily. 90 tablet 3   pantoprazole (PROTONIX) 40 MG tablet Take 1 tablet (40 mg total) by mouth as directed. 90 tablet 0   ranolazine (RANEXA) 1000 MG SR tablet Take 1 tablet (1,000 mg total) by mouth 2 (two) times daily. 180 tablet 3   rosuvastatin (CRESTOR) 20 MG tablet Take 1 tablet (20 mg total) by mouth 3 (three) times a week. 90 tablet 3   Ubrogepant 50 MG TABS Take by mouth.     venlafaxine XR (EFFEXOR-XR) 75 MG 24 hr capsule Take 75 mg by mouth daily.  VITAMIN C, CALCIUM ASCORBATE, PO Take 250 mg by mouth daily.     Evolocumab (REPATHA SURECLICK) 140 MG/ML SOAJ Inject 140 mg into the skin every 14 (fourteen) days. (Patient not taking: Reported on 11/06/2022) 2 mL 11   No current facility-administered medications for this visit.    PHYSICAL EXAM Vitals:   11/06/22 1016 11/06/22 1019  BP: 116/75 118/78  Pulse: 63   Resp: 20   Temp: 98.4 F (36.9 C)   SpO2: 100%   Weight: 140 lb (63.5 kg)   Height: 5\' 7"  (1.702 m)     Middle-age man in no acute distress Well-healed left neck incision Palpable radial pulses bilaterally Regular rate and rhythm Unlabored breathing   PERTINENT LABORATORY AND RADIOLOGIC DATA  Most recent CBC    Latest Ref Rng & Units 10/10/2021   11:05 AM 01/12/2021   10:33 AM 11/28/2018    1:15 PM  CBC  WBC 3.4 - 10.8 x10E3/uL 5.8  6.6  5.9   Hemoglobin 13.0 - 17.7 g/dL 16.1  09.6  04.5   Hematocrit 37.5 - 51.0 % 43.3  42.2  42.6   Platelets 150 - 450 x10E3/uL 246  266  263      Most recent CMP    Latest Ref Rng & Units 08/31/2022   10:09 AM 03/30/2022    2:23 PM 10/10/2021   11:05 AM  CMP  Glucose 70 - 99 mg/dL 93  67  70   BUN 6 - 23 mg/dL 17  16  10    Creatinine 0.40 - 1.50 mg/dL 4.09  8.11  9.14   Sodium 135 - 145 mEq/L 136  138  142   Potassium 3.5 - 5.1 mEq/L 4.4  4.5  4.4   Chloride 96 - 112 mEq/L 101  102  102   CO2 19 - 32 mEq/L 30  31  25    Calcium 8.4 - 10.5 mg/dL 9.2  9.5  9.7   Total Protein  6.0 - 8.3 g/dL 6.8  7.1  6.6   Total Bilirubin 0.2 - 1.2 mg/dL 0.7  0.6  0.5   Alkaline Phos 39 - 117 U/L 44  51  52   AST 0 - 37 U/L 25  22  19    ALT 0 - 53 U/L 20  23  19      Renal function CrCl cannot be calculated (Patient's most recent lab result is older than the maximum 21 days allowed.).  Hgb A1c MFr Bld (%)  Date Value  10/10/2021 5.8 (H)    LDL Cholesterol (Calc)  Date Value Ref Range Status  01/04/2020 67 mg/dL (calc) Final    Comment:    Reference range: <100 . Desirable range <100 mg/dL for primary prevention;   <70 mg/dL for patients with CHD or diabetic patients  with > or = 2 CHD risk factors. Marland Kitchen LDL-C is now calculated using the Martin-Hopkins  calculation, which is a validated novel method providing  better accuracy than the Friedewald equation in the  estimation of LDL-C.  Horald Pollen et al. Lenox Ahr. 7829;562(13): 2061-2068  (http://education.[employer].com/faq/FAQ164)    LDL Cholesterol  Date Value Ref Range Status  08/31/2022 54 0 - 99 mg/dL Final     Vascular Imaging:  CT angiogram head / neck ([facility])  1.  Age-indeterminate critical stenosis versus short segment occlusion of  the V4 segment of left vertebral artery which reconstitutes and terminates  in a patent PICA. This is new when compared to 03/08/2020.  2.  Cranial atherosclerotic disease which includes moderate stenosis of the  left PCA P3 segment and mild stenosis of the right PCA P3 segment, worse  from prior.  3.  60-65% stenosis at the right carotid bulb, similar to prior.  4.  Incompletely visualized apparent wall thickening and irregular luminal  dilatation of the proximal portions of the main pulmonary artery. Recommend  CTA chest angiogram for further evaluation.   CTA chest / abdomen / pelvis 1.  Normal appearance of the main pulmonary artery.  2.  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  3.  Patulous  fluid-filled esophagus with distal wall thickening, which may  represent esophagitis.   Rande Brunt. Lenell Antu, MD FACS [facility] Phone Number: [phone] 11/06/2022 11:47 AM   Total time spent on preparing this encounter including chart review, data review, collecting history, examining the patient, coordinating care for this new patient, 60 minutes.  Portions of this report may have been transcribed using voice recognition software.  Every effort has been made to ensure accuracy; however, inadvertent computerized transcription errors may still be present.

## 2022-11-06 ENCOUNTER — Encounter: Payer: Self-pay | Admitting: Cardiovascular Disease

## 2022-11-06 ENCOUNTER — Encounter: Payer: Self-pay | Admitting: Vascular Surgery

## 2022-11-06 ENCOUNTER — Ambulatory Visit: Payer: Medicare Other | Admitting: Vascular Surgery

## 2022-11-06 VITALS — BP 118/78 | HR 63 | Temp 98.4°F | Resp 20 | Ht 67.0 in | Wt 140.0 lb

## 2022-11-06 DIAGNOSIS — I6523 Occlusion and stenosis of bilateral carotid arteries: Secondary | ICD-10-CM

## 2022-11-08 DIAGNOSIS — G43809 Other migraine, not intractable, without status migrainosus: Secondary | ICD-10-CM | POA: Diagnosis not present

## 2022-11-08 DIAGNOSIS — R519 Headache, unspecified: Secondary | ICD-10-CM | POA: Diagnosis not present

## 2022-11-09 MED ORDER — OLMESARTAN MEDOXOMIL 40 MG PO TABS: 40.0000 mg | ORAL_TABLET | Freq: Every day | ORAL | 3 refills | Status: DC

## 2022-11-09 NOTE — Addendum Note (Signed)
Addended by: Lendon Ka on: 11/09/2022 02:34 PM   Modules accepted: Orders

## 2022-11-09 NOTE — Addendum Note (Signed)
Addended by: Lendon Ka on: 11/09/2022 02:07 PM   Modules accepted: Orders

## 2022-11-15 ENCOUNTER — Other Ambulatory Visit: Payer: Self-pay

## 2022-11-15 DIAGNOSIS — I6523 Occlusion and stenosis of bilateral carotid arteries: Secondary | ICD-10-CM

## 2022-11-15 DIAGNOSIS — I7102 Dissection of abdominal aorta: Secondary | ICD-10-CM

## 2022-11-19 DIAGNOSIS — G43709 Chronic migraine without aura, not intractable, without status migrainosus: Secondary | ICD-10-CM | POA: Diagnosis not present

## 2022-11-20 MED ORDER — OLMESARTAN MEDOXOMIL 40 MG PO TABS: 40.0000 mg | ORAL_TABLET | Freq: Every day | ORAL | 3 refills | Status: DC

## 2022-11-20 NOTE — Addendum Note (Signed)
Addended by: Lendon Ka on: 11/20/2022 03:02 PM   Modules accepted: Orders

## 2022-12-03 DIAGNOSIS — G43709 Chronic migraine without aura, not intractable, without status migrainosus: Secondary | ICD-10-CM | POA: Diagnosis not present

## 2022-12-26 ENCOUNTER — Telehealth: Payer: Self-pay | Admitting: Pharmacy Technician

## 2022-12-26 NOTE — Telephone Encounter (Signed)
Pharmacy Patient Advocate Encounter   Received notification from CoverMyMeds that prior authorization for Repatha SureClick 140MG /ML auto-injectors is required/requested.    PA submitted to [facility] via CoverMyMeds Key/confirmation #/EOC [facility] Status is pending

## 2023-01-02 ENCOUNTER — Encounter: Payer: Self-pay | Admitting: Family Medicine

## 2023-01-02 ENCOUNTER — Ambulatory Visit (INDEPENDENT_AMBULATORY_CARE_PROVIDER_SITE_OTHER): Payer: Medicare Other | Admitting: Family Medicine

## 2023-01-02 VITALS — BP 98/54 | HR 65 | Temp 98.1°F | Ht 67.0 in | Wt 133.3 lb

## 2023-01-02 DIAGNOSIS — I959 Hypotension, unspecified: Secondary | ICD-10-CM

## 2023-01-02 DIAGNOSIS — E785 Hyperlipidemia, unspecified: Secondary | ICD-10-CM | POA: Diagnosis not present

## 2023-01-02 DIAGNOSIS — R55 Syncope and collapse: Secondary | ICD-10-CM | POA: Diagnosis not present

## 2023-01-02 MED ORDER — OLMESARTAN MEDOXOMIL 5 MG PO TABS: ORAL_TABLET | ORAL | 5 refills | Status: DC

## 2023-01-02 NOTE — Progress Notes (Signed)
Established Patient Office Visit  Subjective   Patient ID: [name] Sosa, male    DOB: [birth year]  Age: 67 y.o. MRN: [id number]  Chief Complaint  Patient presents with   Medical Management of Chronic Issues    HPI   [name] Sosa is seen today for the following issues  Recurrent syncopal episodes back in the spring.  He had 3 separate episodes with first occurring around 4-6 when he went to the bathroom to urinate and fell backwards.  Brief loss of consciousness estimated 2 to 3 minutes.  Second occurrence happened just a few minutes after the first episode.  That episode was estimated 30 to 40 seconds and then third occasion was on April 9 after urinating and standing up and fell backwards.  That episode prompted ER visit.  His CTA revealed occlusion of left vertebral artery but not felt likely to explain his syncope.  He reportedly had cardiac monitor through [facility] which did not show any significant arrhythmias.  His blood pressures been on the very low side and his Benicar was eventually reduced from 40 to 20 mg which he takes now.  He still has frequent lightheadedness intermittently with standing.  Drinks lots of water and thinks he is staying fairly well-hydrated.  No recent chest pains.  Denies any dyspnea at this time.  No syncope since April 9th.  Recent echo at [facility] in April showed EF 50% with mild MR and trivial TR  He was referred to local vascular surgeon who felt like patient's carotid disease was unlikely to cause his dizziness or syncope.  Recommended 1 year follow-up with carotid duplex.  He has hyperlipidemia managed with Repatha injections.  He is requesting follow-up lipids today.  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   Past Surgical History:  Procedure Laterality Date   4v cabg     CARDIAC CATHETERIZATION     CARDIAC CATHETERIZATION N/A 03/23/2016   Procedure: Left Heart Cath and Cors/Grafts Angiography;  Surgeon: Kathleene Hazel, MD;  Location: [facility] INVASIVE CV LAB;  Service: Cardiovascular;  Laterality: N/A;   CAROTID ANGIOGRAPHY N/A 10/11/2016   Procedure: Carotid Angiography / Cerebral angiogram;  Surgeon: Fransisco Hertz, MD;  Location: [facility] INVASIVE CV LAB;  Service: Cardiovascular;  Laterality: N/A;   CAROTID ENDARTERECTOMY     COLONOSCOPY  2017   COLONOSCOPY W/ POLYPECTOMY     CORONARY ARTERY BYPASS GRAFT     ENDARTERECTOMY Left 10/16/2016   Procedure: LEFT CAROTID ENDARTERECTOMY WITH PATCH ANGIOPLASTY;  Surgeon: Fransisco Hertz, MD;  Location: [facility] OR;  Service: Vascular;  Laterality: Left;   LEFT HEART CATH AND CORS/GRAFTS ANGIOGRAPHY N/A 01/19/2021   Procedure: LEFT HEART CATH AND CORS/GRAFTS ANGIOGRAPHY;  Surgeon: Kathleene Hazel, MD;  Location: [facility] INVASIVE CV LAB;  Service: Cardiovascular;  Laterality: N/A;   patent grafts     s/p 7     UPPER GASTROINTESTINAL ENDOSCOPY  reports that he quit smoking about 24 years ago. His smoking use included cigarettes. He started smoking about 26 years ago. He has never used smokeless tobacco. He reports that he does not drink alcohol and does not use drugs. family history includes Heart disease in his father and mother; Heart failure in his father and mother; Hypertension in his brother, father, mother, and sister. Allergies  Allergen Reactions   Amlodipine Itching    Chest pain  Chest pain     Chest pain  Chest pain   Chest pain   Lisinopril Cough    Other reaction(s): Cough     Review of Systems  Constitutional:  Negative for malaise/fatigue.  Eyes:  Negative for blurred vision.  Respiratory:  Negative for shortness of breath.   Cardiovascular:  Negative for chest pain.  Neurological:  Negative for dizziness, weakness and headaches.      Objective:     BP (!) 98/54 (BP Location: Left Arm, Patient Position: Sitting, Cuff Size: Normal)   Pulse 65   Temp 98.1 F (36.7 C) (Oral)   Ht 5\' 7"  (1.702 m)   Wt 133 lb 4.8 oz (60.5 kg)   SpO2 99%   BMI 20.88 kg/m  BP Readings from Last 3 Encounters:  01/02/23 (!) 98/54  11/06/22 118/78  10/17/22 110/62   Wt Readings from Last 3 Encounters:  01/02/23 133 lb 4.8 oz (60.5 kg)  11/06/22 140 lb (63.5 kg)  10/17/22 135 lb (61.2 kg)      Physical Exam Vitals reviewed.  Constitutional:      Appearance: He is well-developed.  HENT:     Right Ear: External ear normal.     Left Ear: External ear normal.  Eyes:     Pupils: Pupils are equal, round, and reactive to light.  Neck:     Thyroid: No thyromegaly.  Cardiovascular:     Rate and Rhythm: Normal rate and regular rhythm.  Pulmonary:     Effort: Pulmonary effort is normal. No respiratory distress.     Breath sounds: Normal breath sounds. No wheezing or rales.  Musculoskeletal:     Cervical back: Neck supple.     Right lower leg: No edema.     Left lower leg: No edema.  Neurological:     Mental Status: He is alert and oriented to person, place, and time.      No results found for any visits on 01/02/23.  Last CBC Lab Results  Component Value Date   WBC 5.8 10/10/2021   HGB 14.8 10/10/2021   HCT 43.3 10/10/2021   MCV 94 10/10/2021   MCH 32.0 10/10/2021   RDW 11.9 10/10/2021   PLT 246 10/10/2021   Last metabolic panel Lab Results  Component Value Date   GLUCOSE 93 08/31/2022   NA 136 08/31/2022   K 4.4 08/31/2022   CL 101 08/31/2022   CO2 30 08/31/2022   BUN 17 08/31/2022   CREATININE 0.97 08/31/2022   GFR 81.63 08/31/2022    CALCIUM 9.2 08/31/2022   PROT 6.8 08/31/2022   ALBUMIN 4.0 08/31/2022   LABGLOB 2.2 10/10/2021   AGRATIO 2.0 10/10/2021   BILITOT 0.7 08/31/2022   ALKPHOS 44 08/31/2022   AST 25 08/31/2022   ALT 20 08/31/2022   ANIONGAP 7 10/17/2016   Last lipids Lab Results  Component Value Date   CHOL 127 08/31/2022   HDL 52.90 08/31/2022   LDLCALC 54 08/31/2022   TRIG 98.0 08/31/2022   CHOLHDL 2 08/31/2022  Last thyroid functions Lab Results  Component Value Date   TSH 0.781 10/10/2021   T4TOTAL 7.4 10/24/2016      The ASCVD Risk score (Arnett DK, et al., 2019) failed to calculate for the following reasons:   The patient has a prior MI or stroke diagnosis    Assessment & Plan:   #1 dyslipidemia with history of CAD and bypass several years ago.  Patient on Repatha.  Recheck fasting lipids.  Order placed  #2 recurrent syncope.  This was diagnosed as micturition syncope.  He does have lowish blood pressure today but did not drop significantly with standing.  We discussed reducing his Benicar from 20 to 10 mg and may need to discontinue altogether if he continues to have episodes of dizziness.  Stressed to stay hydrated.    Return in about 2 months (around 03/05/2023).    Evelena Peat, MD

## 2023-01-04 ENCOUNTER — Other Ambulatory Visit: Payer: Medicare Other

## 2023-01-04 DIAGNOSIS — E785 Hyperlipidemia, unspecified: Secondary | ICD-10-CM

## 2023-01-04 NOTE — Addendum Note (Signed)
Addended by: Marian Sorrow D on: 01/04/2023 10:54 AM   Modules accepted: Orders

## 2023-01-05 LAB — LIPID PANEL
Cholesterol: 89 mg/dL (ref <200)
HDL: 50 mg/dL (ref >=40)
LDL Cholesterol (Calc): 21 mg/dL (calc)
Non-HDL Cholesterol (Calc): 39 mg/dL (calc) (ref <130)
Total CHOL/HDL Ratio: 1.8 (calc) (ref <5.0)
Triglycerides: 93 mg/dL (ref <150)

## 2023-01-07 ENCOUNTER — Other Ambulatory Visit (HOSPITAL_COMMUNITY): Payer: Self-pay

## 2023-01-07 NOTE — Telephone Encounter (Signed)
Pharmacy Patient Advocate Encounter  Received notification from [facility] that Prior Authorization for Repatha SureClick 140MG /ML auto-injectors has been APPROVED from 12/28/2022 to 06/18/2023.Marland Kitchen  PA #/Case ID/Reference #: [id number]

## 2023-01-16 ENCOUNTER — Encounter (INDEPENDENT_AMBULATORY_CARE_PROVIDER_SITE_OTHER): Payer: Self-pay

## 2023-01-17 ENCOUNTER — Other Ambulatory Visit: Payer: Self-pay | Admitting: Cardiovascular Disease

## 2023-02-13 ENCOUNTER — Ambulatory Visit: Payer: Medicare Other | Admitting: Family Medicine

## 2023-02-15 DIAGNOSIS — G43E09 Chronic migraine with aura, not intractable, without status migrainosus: Secondary | ICD-10-CM | POA: Diagnosis not present

## 2023-02-27 ENCOUNTER — Telehealth: Payer: Self-pay | Admitting: Cardiovascular Disease

## 2023-02-27 NOTE — Telephone Encounter (Signed)
Patient reports 3 episodes of chest pain over the past week. He states that he has angina but these episodes felt different. He describes them as a pressure. Twice he was laying down when they occurred. The other time he was walking. He reports taking two nitroglycerin with relief of symptoms. I have made patient aware of ER precautions and scheduled him an appointment with DOD tomorrow 9/12.

## 2023-02-27 NOTE — Telephone Encounter (Signed)
Pt c/o of Chest Pain: STAT if CP now or developed within 24 hours  1. Are you having CP right now? no  2. Are you experiencing any other symptoms (ex. SOB, nausea, vomiting, sweating)?    3. How long have you been experiencing CP? Last week   4. Is your CP continuous or coming and going? Coming and going   5. Have you taken Nitroglycerin? no ?

## 2023-02-28 ENCOUNTER — Encounter: Payer: Self-pay | Admitting: Internal Medicine

## 2023-02-28 ENCOUNTER — Ambulatory Visit: Payer: Medicare Other | Attending: Internal Medicine | Admitting: Internal Medicine

## 2023-02-28 VITALS — BP 108/70 | HR 60 | Ht 68.0 in | Wt 138.6 lb

## 2023-02-28 DIAGNOSIS — I25118 Atherosclerotic heart disease of native coronary artery with other forms of angina pectoris: Secondary | ICD-10-CM

## 2023-02-28 MED ORDER — CARVEDILOL 6.25 MG PO TABS: 6.2500 mg | ORAL_TABLET | Freq: Two times a day (BID) | ORAL | 3 refills | Status: DC

## 2023-02-28 NOTE — Progress Notes (Signed)
Cardiology Office Note:  .   Date:  02/28/2023  ID:  [name] Sosa, DOB May 02, 1956, MRN [id number] PCP: Kristian Covey, MD  [facility] Providers Cardiologist:  Verne Carrow, MD    History of Present Illness: .   [name] Sosa is a 67 y.o. male with a hx of CAD s/p CABG x 7 in 2000 at [facility], HTN, HLD, carotid artery disease, and TIA patient who sees Eligha Bridegroom NP in cardiology. He is referred here for an acute visit with CP.  See her note 10/11/2022. He's had recurrent CP in the past. He comes in today to DOD. He presents with new chest pain. The pain, described as a 'spot that was crawling all over me' and 'like somebody put the finger or a screw, but it stops, and it's okay,' occurred three times in the past week. The first two episodes occurred while lying down and preparing for sleep, and the third while walking around the house. Each episode lasted about five minutes and was relieved by nitroglycerin, though the first pill was not always effective. The patient denies any extreme pain or pressure that would necessitate hospitalization.  The patient  recently increased the dose of Ranexa from 500mg  to 1000mg . The Ranexa has been overall helpful, though it occasionally causes heartburn, which is managed with Panadol. The patient was previously on isosorbide 15-20 years ago, but discontinued it due to headaches.  The patient also has a history of nerve pain, for which they take gabapentin, and receives Botox treatments. They deny any swelling in the legs or excessive shortness of breath.   ROS:  per HPI otherwise negative   Studies Reviewed: Marland Kitchen   EKG Interpretation Date/Time:  Thursday February 28 2023 15:56:41 EDT Ventricular Rate:  60 PR Interval:  172 QRS Duration:  96 QT Interval:  452 QTC Calculation: 452 R Axis:   61  Text Interpretation: Normal sinus rhythm Left ventricular hypertrophy with repolarization abnormality ( Cornell product ) Possible Inferior  infarct (cited on or before 03-Dec-2004) When compared with ECG of 12-Dec-2018 08:58, Criteria for Anterior infarct are no longer Present Criteria for Anterolateral infarct are no longer Present Confirmed by Carolan Clines ([id number]) on 02/28/2023 3:58:46 PM    Risk Assessment/Calculations:        Physical Exam:   VS:  BP 108/70 (BP Location: Left Arm, Patient Position: Sitting, Cuff Size: Normal)   Pulse 60   Ht 5\' 8"  (1.727 m)   Wt 138 lb 9.6 oz (62.9 kg)   SpO2 97%   BMI 21.07 kg/m    Wt Readings from Last 3 Encounters:  02/28/23 138 lb 9.6 oz (62.9 kg)  01/02/23 133 lb 4.8 oz (60.5 kg)  11/06/22 140 lb (63.5 kg)    GEN: Well nourished, well developed in no acute distress NECK:No carotid bruits CARDIAC: RRR, no murmurs, rubs, gallops RESPIRATORY:  Clear to auscultation without rales, wheezing or rhonchi  ABDOMEN: Soft, non-tender, non-distended EXTREMITIES:  No edema; No deformity   ASSESSMENT AND PLAN: .   CP CAD , CABG x7 in 2000 - he has older grafts at this point. Most recent cath was 01/19/2021 when he was c/o chest pain. He has known occluded vein graft to the RCA, Both SVG grafts to Diagonal 1 and Diagonal 2 are known to be occluded. He has patent LIMA. Free RIMA to the OM1 is patent. He has occluded native dx (mid-LAD; Lcx). Lcx is filled by the RIMA. His distal RCA has L to R  collaterals - with no signs of ACS; will plan to to uptitrate his antianginal medication - continue asa 81 mg daily - continue repatha and zetia - continue ranexa 1000 mg daily - nitroglycerin PRN - increase coreg to 6.25 mg BID from 3.125 mg BID; will watch BP and ensure not too low  For chronic issues see [place] note from 10/11/2022     Dispo:  FU with Dr. [name] scheduled for January  Signed, Roczen Waymire E, MD

## 2023-02-28 NOTE — Patient Instructions (Signed)
Medication Instructions:   INCREASE YOUR CARVEDILOL TO 6.25 MG BY MOUTH TWICE DAILY  *If you need a refill on your cardiac medications before your next appointment, please call your pharmacy*    Follow-Up:  AS SCHEDULED WITH DR. [name] ON 07/01/23 AT 4 PM.

## 2023-03-03 ENCOUNTER — Encounter: Payer: Self-pay | Admitting: Cardiovascular Disease

## 2023-03-04 DIAGNOSIS — Z79899 Other long term (current) drug therapy: Secondary | ICD-10-CM | POA: Diagnosis not present

## 2023-03-04 DIAGNOSIS — G43E09 Chronic migraine with aura, not intractable, without status migrainosus: Secondary | ICD-10-CM | POA: Diagnosis not present

## 2023-03-04 DIAGNOSIS — Z87891 Personal history of nicotine dependence: Secondary | ICD-10-CM | POA: Diagnosis not present

## 2023-03-06 ENCOUNTER — Ambulatory Visit: Payer: Medicare Other | Admitting: Family Medicine

## 2023-03-15 ENCOUNTER — Encounter: Payer: Self-pay | Admitting: Family Medicine

## 2023-03-15 ENCOUNTER — Ambulatory Visit (INDEPENDENT_AMBULATORY_CARE_PROVIDER_SITE_OTHER): Payer: Medicare Other | Admitting: Family Medicine

## 2023-03-15 VITALS — BP 126/60 | HR 65 | Temp 97.4°F | Ht 68.0 in | Wt 137.5 lb

## 2023-03-15 DIAGNOSIS — I10 Essential (primary) hypertension: Secondary | ICD-10-CM

## 2023-03-15 DIAGNOSIS — Z23 Encounter for immunization: Secondary | ICD-10-CM

## 2023-03-15 DIAGNOSIS — E875 Hyperkalemia: Secondary | ICD-10-CM | POA: Diagnosis not present

## 2023-03-15 DIAGNOSIS — R0789 Other chest pain: Secondary | ICD-10-CM

## 2023-03-15 DIAGNOSIS — R3915 Urgency of urination: Secondary | ICD-10-CM

## 2023-03-15 MED ORDER — MIRABEGRON ER 25 MG PO TB24: 25.0000 mg | ORAL_TABLET | Freq: Every day | ORAL | 5 refills | Status: DC

## 2023-03-15 NOTE — Patient Instructions (Signed)
Let cardiologist know if you have persistent chest pain.

## 2023-03-15 NOTE — Progress Notes (Unsigned)
Established Patient Office Visit  Subjective   Patient ID: [name] Sosa, male    DOB: March 05, 1956  Age: 67 y.o. MRN: [id number]  Chief Complaint  Patient presents with   Medical Management of Chronic Issues    HPI  {History (Optional):23778} Mr Ciccarello is seen today for medical follow-up.  He has history of CAD with CABG number 20 years ago, hypertension, migraine headaches, dyslipidemia.  He had some recent atypical left chest pains and consulted cardiology.  No associated dyspnea.  No associated exertion.  No recent indigestion.  No diaphoresis.  No nausea.  Appetite and weight stable.  He has hyperlipidemia which is treated with Repatha.  Recent LDL cholesterol 21.  He was having some syncope which was felt to be micturition syncope.  Benicar was reduced down to 5 mg daily and has had no further episodes.  Cardiologist had suggested increase his carvedilol from 3.25 to 6.25 mg twice daily but he was reluctant because of prior hypotensive episodes.  He has another problem of urine urgency.  No burning with urination.  No slow stream or any other obstructive urinary symptoms.  Usually gets up couple times at night.  Frequently has to go every 30 minutes during the day.  No caffeine use.  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   Past Surgical History:  Procedure Laterality Date   4v cabg     CARDIAC CATHETERIZATION     CARDIAC CATHETERIZATION N/A 03/23/2016   Procedure: Left Heart Cath and Cors/Grafts  Angiography;  Surgeon: Kathleene Hazel, MD;  Location: [facility] INVASIVE CV LAB;  Service: Cardiovascular;  Laterality: N/A;   CAROTID ANGIOGRAPHY N/A 10/11/2016   Procedure: Carotid Angiography / Cerebral angiogram;  Surgeon: Fransisco Hertz, MD;  Location[facility] INVASIVE CV LAB;  Service: Cardiovascular;  Laterality: N/A;   CAROTID ENDARTERECTOMY     COLONOSCOPY  2017   COLONOSCOPY W/ POLYPECTOMY     CORONARY ARTERY BYPASS GRAFT     ENDARTERECTOMY Left 10/16/2016   Procedure: LEFT CAROTID ENDARTERECTOMY WITH PATCH ANGIOPLASTY;  Surgeon: Fransisco Hertz, MD;  Location: [facility] OR;  Service: Vascular;  Laterality: Left;   LEFT HEART CATH AND CORS/GRAFTS ANGIOGRAPHY N/A 01/19/2021   Procedure: LEFT HEART CATH AND CORS/GRAFTS ANGIOGRAPHY;  Surgeon: Kathleene Hazel, MD;  Location: [facility] INVASIVE CV LAB;  Service: Cardiovascular;  Laterality: N/A;   patent grafts     s/p 7     UPPER GASTROINTESTINAL ENDOSCOPY      reports that he quit smoking about 24 years ago. His smoking use included cigarettes. He started smoking about 26 years ago. He has never used smokeless tobacco. He reports that he does not drink alcohol and does not use drugs. family history includes Heart disease in his father and mother; Heart failure in his father and mother; Hypertension in his brother, father, mother, and sister. Allergies  Allergen Reactions   Amlodipine Itching    Chest pain  Chest  pain     Chest pain  Chest pain   Chest pain   Lisinopril Cough    Other reaction(s): Cough    Review of Systems  Constitutional:  Negative for chills, fever and malaise/fatigue.  Eyes:  Negative for blurred vision.  Respiratory:  Negative for shortness of breath.   Cardiovascular:  Negative for palpitations, leg swelling and PND.  Neurological:  Negative for dizziness, weakness and headaches.      Objective:     BP 126/60 (BP Location: Left Arm, Patient Position: Sitting, Cuff Size: Normal)   Pulse 65   Temp (!) 97.4 F (36.3  C) (Oral)   Ht 5\' 8"  (1.727 m)   Wt 137 lb 8 oz (62.4 kg)   SpO2 100%   BMI 20.91 kg/m  BP Readings from Last 3 Encounters:  03/15/23 126/60  02/28/23 108/70  01/02/23 (!) 98/54   Wt Readings from Last 3 Encounters:  03/15/23 137 lb 8 oz (62.4 kg)  02/28/23 138 lb 9.6 oz (62.9 kg)  01/02/23 133 lb 4.8 oz (60.5 kg)      Physical Exam Vitals reviewed.  Constitutional:      Appearance: He is well-developed.  Eyes:     Pupils: Pupils are equal, round, and reactive to light.  Neck:     Thyroid: No thyromegaly.  Cardiovascular:     Rate and Rhythm: Normal rate and regular rhythm.  Pulmonary:     Effort: Pulmonary effort is normal. No respiratory distress.     Breath sounds: Normal breath sounds. No wheezing or rales.  Musculoskeletal:     Cervical back: Neck supple.  Neurological:     Mental Status: He is alert and oriented to person, place, and time.      No results found for any visits on 03/15/23.  Last CBC Lab Results  Component Value Date   WBC 5.8 10/10/2021   HGB 14.8 10/10/2021   HCT 43.3 10/10/2021   MCV 94 10/10/2021   MCH 32.0 10/10/2021   RDW 11.9 10/10/2021   PLT 246 10/10/2021   Last metabolic panel Lab Results  Component Value Date   GLUCOSE 93 08/31/2022   NA 136 08/31/2022   K 4.4 08/31/2022   CL 101 08/31/2022   CO2 30 08/31/2022   BUN 17 08/31/2022   CREATININE 0.97 08/31/2022   GFR 81.63 08/31/2022   CALCIUM 9.2 08/31/2022   PROT 6.8 08/31/2022   ALBUMIN 4.0 08/31/2022   LABGLOB 2.2 10/10/2021   AGRATIO 2.0 10/10/2021   BILITOT 0.7 08/31/2022   ALKPHOS 44 08/31/2022   AST 25 08/31/2022   ALT 20 08/31/2022   ANIONGAP 7 10/17/2016   Last lipids Lab Results  Component Value Date   CHOL 89 01/04/2023   HDL 50 01/04/2023   LDLCALC 21 01/04/2023   TRIG 93 01/04/2023   CHOLHDL 1.8 01/04/2023      The ASCVD Risk score (Arnett DK, et al., 2019) failed to calculate for the following reasons:   The patient has a prior MI or  stroke diagnosis    Assessment & Plan:   #1 hypertension stable.  Patient on low-dose Benicar and carvedilol.  Recheck basic metabolic panel today  #2 recent atypical chest pains.  He is consulted with cardiology.  Is felt to have probably noncardiac chest pain.  No recent exertional symptoms.  He is encouraged to follow-up promptly with cardiologist recurrent symptoms  #3 urine urgency.  Continue to eliminate caffeine.  Avoid excessive fluids at night.  Consider trial of low-dose Myrbetriq 25 mg nightly  Recommended flu vaccine and patient consents Return in about 3 months (around 06/14/2023).    Evelena Peat, MD

## 2023-03-16 LAB — BASIC METABOLIC PANEL
BUN/Creatinine Ratio: 25 — ABNORMAL HIGH (ref 10–24)
BUN: 20 mg/dL (ref 8–27)
CO2: 26 mmol/L (ref 20–29)
Calcium: 9.9 mg/dL (ref 8.6–10.2)
Chloride: 98 mmol/L (ref 96–106)
Creatinine, Ser: 0.79 mg/dL (ref 0.76–1.27)
EGFR: 98
Glucose: 86 mg/dL (ref 70–99)
Potassium: 5.7 mmol/L — ABNORMAL HIGH (ref 3.5–5.2)
Sodium: 140 mmol/L (ref 134–144)
eGFR: 98 mL/min/{1.73_m2} (ref >59)

## 2023-03-17 ENCOUNTER — Encounter: Payer: Self-pay | Admitting: Family Medicine

## 2023-03-18 ENCOUNTER — Encounter: Payer: Self-pay | Admitting: Family Medicine

## 2023-03-18 ENCOUNTER — Telehealth: Payer: Self-pay | Admitting: Family Medicine

## 2023-03-18 NOTE — Telephone Encounter (Signed)
Please see result note 

## 2023-03-18 NOTE — Telephone Encounter (Signed)
Pt is returning mykal call concerning blood work results 

## 2023-03-18 NOTE — Addendum Note (Signed)
Addended by: Christy Sartorius on: 03/18/2023 01:08 PM   Modules accepted: Orders

## 2023-04-02 ENCOUNTER — Other Ambulatory Visit: Payer: Medicare Other

## 2023-04-03 ENCOUNTER — Other Ambulatory Visit (INDEPENDENT_AMBULATORY_CARE_PROVIDER_SITE_OTHER): Payer: Medicare Other

## 2023-04-03 ENCOUNTER — Other Ambulatory Visit: Payer: Self-pay | Admitting: Family Medicine

## 2023-04-03 DIAGNOSIS — E875 Hyperkalemia: Secondary | ICD-10-CM

## 2023-04-04 LAB — BASIC METABOLIC PANEL
BUN: 21 mg/dL (ref 6–23)
CO2: 29 meq/L (ref 19–32)
Calcium: 9.4 mg/dL (ref 8.4–10.5)
Chloride: 99 meq/L (ref 96–112)
Creatinine, Ser: 0.96 mg/dL (ref 0.40–1.50)
GFR: 82.31 mL/min (ref >60.00)
Glucose, Bld: 122 mg/dL — ABNORMAL HIGH (ref 70–99)
Potassium: 4.8 meq/L (ref 3.5–5.1)
Sodium: 135 meq/L (ref 135–145)

## 2023-05-07 ENCOUNTER — Other Ambulatory Visit: Payer: Self-pay | Admitting: Family Medicine

## 2023-05-15 ENCOUNTER — Encounter: Payer: Self-pay | Admitting: Cardiovascular Disease

## 2023-05-15 ENCOUNTER — Ambulatory Visit: Payer: Medicare Other | Attending: Cardiovascular Disease | Admitting: Cardiovascular Disease

## 2023-05-15 VITALS — BP 134/86 | HR 64 | Ht 68.0 in | Wt 141.4 lb

## 2023-05-15 DIAGNOSIS — I10 Essential (primary) hypertension: Secondary | ICD-10-CM

## 2023-05-15 DIAGNOSIS — E7800 Pure hypercholesterolemia, unspecified: Secondary | ICD-10-CM | POA: Diagnosis not present

## 2023-05-15 DIAGNOSIS — I25118 Atherosclerotic heart disease of native coronary artery with other forms of angina pectoris: Secondary | ICD-10-CM | POA: Diagnosis not present

## 2023-05-15 NOTE — Patient Instructions (Signed)
Follow-Up: At [facility], you and your health needs are our priority.  As part of our continuing mission to provide you with exceptional heart care, we have created designated Provider Care Teams.  These Care Teams include your primary Cardiologist (physician) and Advanced Practice Providers (APPs -  Physician Assistants and Nurse Practitioners) who all work together to provide you with the care you need, when you need it.  Your next appointment:   1 year(s)  Provider:   Verne Carrow, MD

## 2023-05-15 NOTE — Progress Notes (Signed)
Chief Complaint  Patient presents with   Follow-up    CAD   History of Present Illness: 67 yo male with h/o CAD s/p 7V CABG in 2000 at [facility], HTN, hyperlipidemia, carotid artery disease and prior TIA here today for cardiac follow up. He was seen as a new pt in January 2011 with complaints of chest pain. He had been followed from 2000 until 2011 by Dr. Juliann Pares in [place]. He has had frequent episodes of chest pain since I met him in 2011. Cardiac cath in 2011 with 5/7 patent bypass grafts. Stress myoview in August 2015 with no ischemia, LVEF=48%. Cardiac cath October 2017 with patent LIMA to LAD, patent SVG to OM which filled all of the Circumflex and occluded RCA with occluded SVG to RCA. The RCA filled from left to right collaterals. Nuclear stress test June 2020 with evidence of prior infarct but overall low risk. Echo June 2020 with LVEF=45-50% with segmental wall motion abnormality. No valve disease. He was seen in the office July 2022 and reported worsened chest pains. Cardiac cath 01/19/21 with 2/5 patent grafts (LIMA to LAD, free RIMA to Circumflex). The RCA is occluded and fills from left to right collaterals. LVEF=40-45%.  He underwent left carotid endarterectomy in May 2018 following a TIA. His carotid disease is followed in vascular surgery at [facility]. Carotid dopplers 11/02/20 with 40-59% RICA stenosis, 1-39% LICA stenosis. Dizziness in May 2023. Echo May 2023 with LVEF=55-60% with akinesis of the basal inferior wall, mild mitral regurgitation. Cardiac monitor June 2023 with sinus, PVCs. Seen for chest pain in January 2024 and Ranexa increased to 1000 mg po BID. He had three syncopal events in April 2024 while urinating and was felt to have micturition syncope. He is now sitting down to urinate and has not had further syncope. He was seen for chest pain in September 2024 by Dr. Wyline Mood and Coreg was increased.    He is here today for follow up. The patient denies any dyspnea, palpitations, lower  extremity edema, orthopnea, PND, dizziness, near syncope or syncope. No change in chronic occasional chest pains.   Primary Care Physician: Kristian Covey, MD  Past Medical History:  Diagnosis Date   CAD (coronary artery disease)    s/p 7 vessel CABG 2000 at [facility] // Echo 11/2018: EF 45-50, antero-apical and inferior akinesis // Myoview 11/2018: EF 51, distal ant/apical infarct and small inferior infarct with very mild peri-infarct ischemia; Low Risk   Carotid artery occlusion    High cholesterol    Hyperlipidemia    Hypertension    Pre-diabetes    < 6.1 2017   Stroke (HCC) 04/2016   TIA- 04/2016 double vision 2nd - speech - no residual   Trochlear neuropathy, 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    Past Surgical History:  Procedure Laterality Date   4v cabg     CARDIAC CATHETERIZATION     CARDIAC CATHETERIZATION N/A 03/23/2016   Procedure: Left Heart Cath and Cors/Grafts Angiography;  Surgeon: Kathleene Hazel, MD;  Location: [facility] INVASIVE CV LAB;  Service: Cardiovascular;  Laterality: N/A;   CAROTID ANGIOGRAPHY N/A 10/11/2016   Procedure: Carotid Angiography / Cerebral angiogram;  Surgeon: Fransisco Hertz, MD;  Location: [facility] INVASIVE CV LAB;  Service: Cardiovascular;  Laterality: N/A;   CAROTID ENDARTERECTOMY     COLONOSCOPY  2017   COLONOSCOPY W/ POLYPECTOMY     CORONARY ARTERY BYPASS GRAFT     ENDARTERECTOMY Left 10/16/2016   Procedure: LEFT CAROTID ENDARTERECTOMY WITH PATCH ANGIOPLASTY;  Surgeon: Fransisco Hertz, MD;  Location: [facility] OR;  Service: Vascular;  Laterality: Left;   LEFT HEART CATH AND CORS/GRAFTS ANGIOGRAPHY N/A 01/19/2021   Procedure: LEFT HEART CATH AND CORS/GRAFTS ANGIOGRAPHY;  Surgeon: Kathleene Hazel, MD;  Location: [facility] INVASIVE CV LAB;  Service: Cardiovascular;  Laterality: N/A;   patent grafts     s/p 7     UPPER GASTROINTESTINAL ENDOSCOPY      Current Outpatient Medications  Medication Sig Dispense Refill   ascorbic acid (VITAMIN C) 500 MG tablet Take 500 mg by mouth  daily.     aspirin EC 81 MG tablet Take 1 tablet (81 mg total) by mouth daily. Swallow whole. 90 tablet 3   botulinum toxin Type A (BOTOX) 200 units injection Inject 200 Units into the muscle every 3 (three) months.     carvedilol (COREG) 3.125 MG tablet Take 3.125 mg by mouth 2 (two) times daily.     Erenumab-aooe 70 MG/ML SOAJ Inject into the skin.     ezetimibe (ZETIA) 10 MG tablet Take 1 tablet (10 mg total) by mouth daily. 90 tablet 3   gabapentin (NEURONTIN) 300 MG capsule Take 300 mg by mouth 3 (three) times daily.     mirabegron ER (MYRBETRIQ) 25 MG TB24 tablet Take 1 tablet (25 mg total) by mouth daily. 30 tablet 5   Multiple Vitamin (MULTIVITAMIN) tablet Take 1 tablet by mouth daily.     nitroGLYCERIN (NITROSTAT) 0.4 MG SL tablet Place 1 tablet (0.4 mg total) under the tongue every 5 (five) minutes as needed for chest pain (up to 3 doses). 25 tablet 3   olmesartan (BENICAR) 5 MG tablet Take two tablets by mouth once daily 60 tablet 5   Omega-3 1000 MG CAPS Take 1 capsule by mouth daily.     ranolazine (RANEXA) 1000 MG SR tablet Take 1 tablet (1,000 mg total) by mouth 2 (two) times daily. 180 tablet 3   Ubrogepant 50 MG TABS Take by mouth.     venlafaxine XR (EFFEXOR-XR) 75 MG 24 hr capsule Take 75 mg by mouth daily.     VITAMIN C, CALCIUM ASCORBATE, PO Take 250 mg by mouth daily.     No current facility-administered medications for this visit.    Allergies  Allergen Reactions   Amlodipine Itching    Chest pain  Chest pain     Chest pain  Chest pain   Chest pain   Lisinopril Cough    Other reaction(s): Cough    Social History   Socioeconomic History   Marital status: Single    Spouse name: Not on file   Number of children: 3   Years of education: AS + 2   Highest education level: Not on file  Occupational History   Occupation: [employer]  Tobacco Use   Smoking status: Former    Current packs/day: 0.00    Types: Cigarettes    Start date: 06/19/1996    Quit date:  06/19/1998    Years since quitting: 24.9   Smokeless tobacco: Never  Vaping Use   Vaping status: Never Used  Substance and Sexual Activity   Alcohol use: No   Drug use: No   Sexual activity: Yes  Other Topics Concern   Not on file  Social History Narrative   Lives at home alone   Right-handed   Caffeine: 2 cups per day   Social Determinants of Health   Financial Resource Strain: Low Risk  (09/26/2022)   Received [facility]  [employer]   Overall Financial Resource Strain (CARDIA)    Difficulty of Paying Living Expenses: Not hard at all  Food Insecurity: No Food Insecurity (09/26/2022)   Received from [facility], [facility]   Hunger Vital Sign    Worried About Running Out of Food in the Last Year: Never true    Ran Out of Food in the Last Year: Never true  Transportation Needs: Unknown (09/26/2022)   Received from [facility], [facility]   [facility] - Transportation    In the past 12 months, has lack of transportation kept you from medical appointments or from getting medications?: No    Lack of Transportation (Non-Medical): Not on file  Physical Activity: Not on file  Stress: Not on file (07/27/2019)  Social Connections: Unknown (10/30/2021)   Received from [facility], [facility]   Social Network    Social Network: Not on file  Intimate Partner Violence: Unknown (09/21/2021)   Received from [facility], [facility]   HITS    Physically Hurt: Not on file    Insult or Talk Down To: Not on file    Threaten Physical Harm: Not on file    Scream or Curse: Not on file    Family History  Problem Relation Age of Onset   Heart failure Mother    Hypertension Mother    Heart disease Mother    Heart failure Father    Hypertension Father    Heart disease Father        before age 45   Hypertension Sister    Hypertension Brother    Heart attack Neg Hx    Stroke  Neg Hx     Review of Systems:  As stated in the HPI and otherwise negative.   BP 134/86   Pulse 64   Ht 5\' 8"  (1.727 m)   Wt 64.1 kg   SpO2 96%   BMI 21.50 kg/m   Physical Examination: General: Well developed, well nourished, NAD  HEENT: OP clear, mucus membranes moist  SKIN: warm, dry. No rashes. Neuro: No focal deficits  Musculoskeletal: Muscle strength 5/5 all ext  Psychiatric: Mood and affect normal  Neck: No JVD, no carotid bruits, no thyromegaly, no lymphadenopathy.  Lungs:Clear bilaterally, no wheezes, rhonci, crackles Cardiovascular: Regular rate and rhythm. No murmurs, gallops or rubs. Abdomen:Soft. Bowel sounds present. Non-tender.  Extremities: No lower extremity edema. Pulses are 2 + in the bilateral DP/PT.  Echo May 2023:  1. Akinesis of the basal inferior wall with overall normal LV function.   2. Left ventricular ejection fraction, by estimation, is 55 to 60%. The  left ventricle has normal function. The left ventricle demonstrates  regional wall motion abnormalities (see scoring diagram/findings for  description). Left ventricular diastolic  parameters are consistent with Grade I diastolic dysfunction (impaired  relaxation).   3. Right ventricular systolic function is normal. The right ventricular  size is normal.   4. Left atrial size was mildly dilated.   5. The mitral valve is normal in structure. Mild mitral valve  regurgitation. No evidence of mitral stenosis. There is mild late systolic  prolapse of the medial segment of the anterior leaflet of the mitral  valve.   6. The aortic valve is tricuspid. Aortic valve regurgitation is not  visualized. Aortic valve sclerosis is present, with no evidence of aortic  valve stenosis.   7. Aortic dilatation noted. There is  borderline dilatation of the aortic  root, measuring 38 mm.   8. The inferior vena cava is normal in size with greater than 50%  respiratory variability, suggesting right atrial pressure of 3  mmHg.   EKG:  EKG is not ordered today. The EKG demonstrates   Recent Labs: 08/31/2022: ALT 20 04/03/2023: BUN 21; Creatinine, Ser 0.96; Potassium 4.8; Sodium 135   Lipid Panel    Component Value Date/Time   CHOL 89 01/04/2023 1054   CHOL 137 01/29/2019 0818   TRIG 93 01/04/2023 1054   HDL 50 01/04/2023 1054   HDL 50 01/29/2019 0818   CHOLHDL 1.8 01/04/2023 1054   VLDL 19.6 08/31/2022 1009   LDLCALC 21 01/04/2023 1054     Wt Readings from Last 3 Encounters:  05/15/23 64.1 kg  03/15/23 62.4 kg  02/28/23 62.9 kg    Assessment and Plan:   1. CAD s/p CABG with unstable angina: He has chronic chest pains. No change over the past few months. CAD stable by cardiac cath in 2022. Cardiac cath 01/19/21 with 2/5 patent grafts, collateral filling of the occluded RCA. Will continue Plavix, Coreg, Crestor and Ranexa. He did not tolerate Norvasc.   2. HTN: BP is controlled. No changes  3. HLD: LDL 21 in July 2024. Continue statin.     4. Carotid artery disease: s/p left CEA in 2018. Followed in [facility] by Dr. Lenell Antu.    Labs/ tests ordered today include:  No orders of the defined types were placed in this encounter.  Disposition:   F/U with me in one year.   Signed, Verne Carrow, MD 05/15/2023 4:55 PM    [facility] [address] [place], [place], [place]  [address] Phone: [phone]; Fax: [phone]

## 2023-06-06 DIAGNOSIS — G43709 Chronic migraine without aura, not intractable, without status migrainosus: Secondary | ICD-10-CM | POA: Diagnosis not present

## 2023-06-25 ENCOUNTER — Telehealth: Payer: Medicare Other | Admitting: Family Medicine

## 2023-06-25 ENCOUNTER — Encounter: Payer: Self-pay | Admitting: Family Medicine

## 2023-06-25 DIAGNOSIS — Z0000 Encounter for general adult medical examination without abnormal findings: Secondary | ICD-10-CM

## 2023-06-25 NOTE — Progress Notes (Signed)
 PATIENT CHECK-IN and HEALTH RISK ASSESSMENT QUESTIONNAIRE:  -completed by phone/video for upcoming Medicare Preventive Visit  Pre-Visit Check-in: 1)Vitals (height, wt, BP, etc) - record in vitals section for visit on day of visit Request home vitals (wt, BP, etc.) and enter into vitals, THEN update Vital Signs SmartPhrase below at the top of the HPI. See below.  2)Review and Update Medications, Allergies PMH, Surgeries, Social history in Epic 3)Hospitalizations in the last year with date/reason? Yes, April 10,2024, falling   4)Review and Update Care Team (patient's specialists) in Epic 5) Complete PHQ9 in Epic  6) Complete Fall Screening in Epic 7)Review all Health Maintenance Due and order under PCP if not done.  Medicare Wellness Patient Questionnaire:  Answer theses question about your habits: How often do you have a drink containing alcohol?no  How many drinks containing alcohol do you have on a typical day when you are drinking?na  How often do you have six or more drinks on one occasion?na  Have you ever smoked?yes  Quit date if applicable? 25 years  How many packs a day do/did you smoke? 1 pack  Do you use smokeless tobacco?No  Do you use an illicit drugs?NO  On average, how many days per week do you engage in moderate to strenuous exercise (like a brisk walk)?walking 3 times a week  On average, how many minutes do you engage in exercise at this level?40 minutes  Are you sexually active? Yes Number of partners?1 Typical breakfast: oatmeal, fruit Typical lunch: turkey and salad  Typical dinner:meat and salad, pasta, fish  Typical snacks:fruit   Beverages: water   Answer theses question about your everyday activities: Can you perform most household chores?yes  Are you deaf or have significant trouble hearing?no  Do you feel that you have a problem with memory?No  Do you feel safe at home?Yes  Last dentist visit?year ago  8. Do you have any difficulty performing your  everyday activities?NO  Are you having any difficulty walking, taking medications on your own, and or difficulty managing daily home needs?NO  Do you have difficulty walking or climbing stairs?NO  Do you have difficulty dressing or bathing?No  Do you have difficulty doing errands alone such as visiting a doctor's office or shopping?NO  Do you currently have any difficulty preparing food and eating?NO  Do you currently have any difficulty using the toilet?NO  Do you have any difficulty managing your finances?NO  Do you have any difficulties with housekeeping of managing your housekeeping?NO    Do you have Advanced Directives in place (Living Will, Healthcare Power or Attorney)? NO    Last eye Exam and location?Jan 2024, [facility]    Do you currently use prescribed or non-prescribed narcotic or opioid pain medications?NO   Do you have a history or close family history of breast, ovarian, tubal or peritoneal cancer or a family member with BRCA (breast cancer susceptibility 1 and 2) gene mutations? Yes, Grandmother had breast cancer     Nurse/Assistant Credentials/time stamp:Leah ASABRA Silvan CMA 4:21pm   ----------------------------------------------------------------------------------------------------------------------------------------------------------------------------------------------------------------------  Because this visit was a virtual/telehealth visit, some criteria may be missing or patient reported. Any vitals not documented were not able to be obtained and vitals that have been documented are patient reported.    MEDICARE ANNUAL PREVENTIVE CARE VISIT WITH PROVIDER (Welcome to Medicare, initial annual wellness or annual wellness exam)  Virtual Visit via Video Note  I connected with [name] Sosa on 06/25/23  by a video enabled telemedicine application and  verified that I am speaking with the correct person using two identifiers.  Location patient: home Location  provider:work or home office Persons participating in the virtual visit: patient, provider  Concerns and/or follow up today: stable - reports now on emgality instead of amovig.   See HM section in Epic for other details of completed HM.    ROS: negative for report of fevers, unintentional weight loss, vision changes, vision loss, hearing loss or change, change in chest pain, sob, hemoptysis, melena, hematochezia, hematuria, falls, bleeding or bruising, thoughts of suicide or self harm, memory loss  Patient-completed extensive health risk assessment - reviewed and discussed with the patient: See Health Risk Assessment completed with patient prior to the visit either above or in recent phone note. This was reviewed in detailed with the patient today and appropriate recommendations, orders and referrals were placed as needed per Summary below and patient instructions.   Review of Medical History: -PMH, PSH, Family History and current specialty and care providers reviewed and updated and listed below   Patient Care Team: Micheal Wolm ORN, MD as PCP - General (Family Medicine) Verlin Lonni BIRCH, MD as PCP - Cardiology (Cardiology) Verlin Lonni BIRCH, MD (Cardiology) Jenel Carlin POUR, MD (Inactive) as Consulting Physician (Neurology) Liane Sharyne MATSU, [facility] (Inactive) as Pharmacist (Pharmacist)   Past Medical History:  Diagnosis Date   CAD (coronary artery disease)    s/p 7 vessel CABG 2000 at 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    Past Surgical History:  Procedure Laterality Date   4v  cabg     CARDIAC CATHETERIZATION     CARDIAC CATHETERIZATION N/A 03/23/2016   Procedure: Left Heart Cath and Cors/Grafts Angiography;  Surgeon: Lonni BIRCH Verlin, MD;  Location: [facility] INVASIVE CV LAB;  Service: Cardiovascular;  Laterality: N/A;   CAROTID ANGIOGRAPHY N/A 10/11/2016   Procedure: Carotid Angiography / Cerebral angiogram;  Surgeon: Redell LITTIE Door, MD;  Location: [facility] INVASIVE CV LAB;  Service: Cardiovascular;  Laterality: N/A;   CAROTID ENDARTERECTOMY     COLONOSCOPY  2017   COLONOSCOPY W/ POLYPECTOMY     CORONARY ARTERY BYPASS GRAFT     ENDARTERECTOMY Left 10/16/2016   Procedure: LEFT CAROTID ENDARTERECTOMY WITH PATCH ANGIOPLASTY;  Surgeon: Redell LITTIE Door, MD;  Location: [facility] OR;  Service: Vascular;  Laterality: Left;   LEFT HEART CATH AND CORS/GRAFTS ANGIOGRAPHY N/A 01/19/2021   Procedure: LEFT HEART CATH AND CORS/GRAFTS ANGIOGRAPHY;  Surgeon: Verlin Lonni BIRCH, MD;  Location: [facility] INVASIVE CV LAB;  Service: Cardiovascular;  Laterality: N/A;   patent grafts     s/p 7     UPPER GASTROINTESTINAL ENDOSCOPY      Social History   Socioeconomic History   Marital status: Single    Spouse name: Not on file   Number of children: 3   Years of education: AS + 2   Highest education level: Not on file  Occupational History   Occupation: [employer]  Tobacco Use   Smoking status: Former    Current packs/day: 0.00    Types: Cigarettes    Start  date: 06/19/1996    Quit date: 06/19/1998    Years since quitting: 25.0   Smokeless tobacco: Never  Vaping Use   Vaping status: Never Used  Substance and Sexual Activity   Alcohol use: No   Drug use: No   Sexual activity: Yes  Other Topics Concern   Not on file  Social History Narrative   Lives at home alone   Right-handed   Caffeine: 2 cups per day   Social Drivers of Health   Financial Resource Strain: Low Risk  (09/26/2022)   Received from [facility], [facility]   Overall Financial Resource Strain  (CARDIA)    Difficulty of Paying Living Expenses: Not hard at all  Food Insecurity: No Food Insecurity (09/26/2022)   Received from [facility], [facility]   Hunger Vital Sign    Worried About Running Out of Food in the Last Year: Never true    Ran Out of Food in the Last Year: Never true  Transportation Needs: Unknown (09/26/2022)   Received from [facility], [facility]   [facility] - Transportation    In the past 12 months, has lack of transportation kept you from medical appointments or from getting medications?: No    Lack of Transportation (Non-Medical): Not on file  Physical Activity: Not on file  Stress: Not on file (07/27/2019)  Social Connections: Unknown (10/30/2021)   Received from [facility], [facility]   Social Network    Social Network: Not on file  Intimate Partner Violence: Unknown (09/21/2021)   Received from [facility], [facility]   HITS    Physically Hurt: Not on file    Insult or Talk Down To: Not on file    Threaten Physical Harm: Not on file    Scream or Curse: Not on file    Family History  Problem Relation Age of Onset   Heart failure Mother    Hypertension Mother    Heart disease Mother    Heart failure Father    Hypertension Father    Heart disease Father        before age 72   Hypertension Sister    Hypertension Brother    Heart attack Neg Hx    Stroke Neg Hx     Current Outpatient Medications on File Prior to Visit  Medication Sig Dispense Refill   ascorbic acid (VITAMIN C) 500 MG tablet Take 500 mg by mouth daily.     aspirin  EC 81 MG tablet Take 1 tablet (81 mg total) by mouth daily. Swallow whole. 90 tablet 3   botulinum toxin Type A (BOTOX) 200 units injection Inject 200 Units into the muscle every 3 (three) months.     carvedilol  (COREG ) 3.125 MG tablet Take 3.125 mg by mouth 2 (two) times daily.     EMGALITY 120 MG/ML SOAJ Inject 120 mg subcutaneously every 28  (twenty-eight) days     Erenumab-aooe 70 MG/ML SOAJ Inject into the skin.     ezetimibe  (ZETIA ) 10 MG tablet Take 1 tablet (10 mg total) by mouth daily. 90 tablet 3   gabapentin (NEURONTIN) 300 MG capsule Take 300 mg by mouth 3 (three) times daily.     mirabegron  ER (MYRBETRIQ ) 25 MG TB24 tablet Take 1 tablet (25 mg total) by mouth daily. 30 tablet 5   Multiple Vitamin (MULTIVITAMIN) tablet Take 1 tablet by mouth daily.     nitroGLYCERIN  (NITROSTAT ) 0.4 MG  SL tablet Place 1 tablet (0.4 mg total) under the tongue every 5 (five) minutes as needed for chest pain (up to 3 doses). 25 tablet 3   olmesartan  (BENICAR ) 5 MG tablet Take two tablets by mouth once daily 60 tablet 5   Omega-3 1000 MG CAPS Take 1 capsule by mouth daily.     ranolazine  (RANEXA ) 1000 MG SR tablet Take 1 tablet (1,000 mg total) by mouth 2 (two) times daily. 180 tablet 3   UBRELVY 100 MG TABS Take by mouth.     Ubrogepant 50 MG TABS Take by mouth.     venlafaxine  XR (EFFEXOR -XR) 75 MG 24 hr capsule Take 75 mg by mouth daily.     VITAMIN C, CALCIUM  ASCORBATE, PO Take 250 mg by mouth daily.     No current facility-administered medications on file prior to visit.    Allergies  Allergen Reactions   Amlodipine  Itching    Chest pain  Chest pain     Chest pain  Chest pain   Chest pain   Lisinopril  Cough    Other reaction(s): Cough       Physical Exam Vitals requested from patient and listed below if patient had equipment and was able to obtain at home for this virtual visit: There were no vitals filed for this visit. Estimated body mass index is 21.5 kg/m as calculated from the following:   Height as of 05/15/23: 5' 8 (1.727 m).   Weight as of 05/15/23: 141 lb 6.4 oz (64.1 kg).  EKG (optional): deferred due to virtual visit  GENERAL: alert, oriented, no acute distress detected; full vision exam deferred due to pandemic and/or virtual encounter   HEENT: atraumatic, conjunttiva clear, no obvious abnormalities on  inspection of external nose and ears  NECK: normal movements of the head and neck  LUNGS: on inspection no signs of respiratory distress, breathing rate appears normal, no obvious gross SOB, gasping or wheezing  CV: no obvious cyanosis  MS: moves all visible extremities without noticeable abnormality  PSYCH/NEURO: pleasant and cooperative, no obvious depression or anxiety, speech and thought processing grossly intact, Cognitive function grossly intact  Flowsheet Row Office Visit from 10/05/2022 in [facility]  PHQ-9 Total Score 0           06/25/2023    4:13 PM 10/05/2022    9:28 AM 08/28/2022   11:40 AM 05/24/2021   11:40 AM 11/20/2017    8:02 AM  Depression screen PHQ 2/9  Decreased Interest 0 0 0 0 0  Down, Depressed, Hopeless 0 0 0 0 0  PHQ - 2 Score 0 0 0 0 0  Altered sleeping  0     Tired, decreased energy  0     Change in appetite  0     Feeling bad or failure about yourself   0     Trouble concentrating  0     Moving slowly or fidgety/restless  0     Suicidal thoughts  0     PHQ-9 Score  0     Difficult doing work/chores  Not difficult at all          11/20/2017    8:02 AM 01/19/2021    8:08 AM 12/15/2021    1:45 PM 10/05/2022    9:28 AM 06/25/2023    4:13 PM  Fall Risk  Falls in the past year? No  1 0 0  Was there an injury with Fall?  1 0 0  Fall Risk Category Calculator   2 0 0  Fall Risk Category (Retired)   Moderate    (RETIRED) Patient Fall Risk Level  Low fall risk Low fall risk    Patient at Risk for Falls Due to   No Fall Risks No Fall Risks   Fall risk Follow up   Falls evaluation completed Falls evaluation completed Falls evaluation completed     SUMMARY AND PLAN:  Encounter for Medicare annual wellness exam   Discussed applicable health maintenance/preventive health measures and advised and referred or ordered per patient preferences: -discussed hep c screening, he is considering -discussed vaccines due - he can  get at pharmacy, advised to let us  know if he does so that we can update -his colonoscopy per last report was due in 5 rather than 10 years - so updated in [facility] Health Maintenance  Topic Date Due   Hepatitis C Screening  Never done   COVID-19 Vaccine (3 - Pfizer risk series) 07/11/2023 (Originally 11/11/2019)   Zoster Vaccines- Shingrix (1 of 2) 09/23/2023 (Originally 08/25/1974)   Medicare Annual Wellness (AWV)  06/24/2024   Colonoscopy  12/27/2025   DTaP/Tdap/Td (3 - Td or Tdap) 09/24/2032   Pneumonia Vaccine 17+ Years old  Completed   HPV VACCINES  Aged Out     Education and counseling on the following was provided based on the above review of health and a plan/checklist for the patient, along with additional information discussed, was provided for the patient in the patient instructions :  -Advised on importance of completing advanced directives, discussed options for completing and provided information in patient instructions as well -Advised and counseled on a healthy lifestyle - including the importance of a healthy diet, regular physical activity, social connections and stress management. -Reviewed patient's current diet. Advised and counseled on a whole foods based healthy diet. A summary of a healthy diet was provided in the Patient Instructions.  -reviewed patient's current physical activity level and discussed exercise guidelines for adults. Discussed community resources and ideas for safe exercise at home to assist in meeting exercise guideline recommendations in a safe and healthy way. Encouraged to add in strength training 2x per week.  -Advise yearly dental visits at minimum and regular eye exams  Follow up: see patient instructions   Patient Instructions  I really enjoyed getting to talk with you today! I am available on Tuesdays and Thursdays for virtual visits if you have any questions or concerns, or if I can be of any further assistance.   CHECKLIST FROM ANNUAL WELLNESS  VISIT:  -Follow up (please call to schedule if not scheduled after visit):   -yearly for annual wellness visit with primary care office  Here is a list of your preventive care/health maintenance measures and the plan for each if any are due:  PLAN For any measures below that may be due:   Health Maintenance  Topic Date Due   Hepatitis C Screening  Never done   COVID-19 Vaccine (3 - Pfizer risk series) 07/11/2023 (Originally 11/11/2019)   Zoster Vaccines- Shingrix (1 of 2) 09/23/2023 (Originally 08/25/1974)   Medicare Annual Wellness (AWV)  06/24/2024   Colonoscopy  12/27/2025   DTaP/Tdap/Td (3 - Td or Tdap) 09/24/2032   Pneumonia Vaccine 68+ Years old  Completed   HPV VACCINES  Aged Out    -See a dentist at least yearly  -Get your eyes checked and then per your eye specialist's recommendations  -Other issues addressed today:   -  I have included below further information regarding a healthy whole foods based diet, physical activity guidelines for adults, stress management and opportunities for social connections. I hope you find this information useful.   -----------------------------------------------------------------------------------------------------------------------------------------------------------------------------------------------------------------------------------------------------------  NUTRITION: -eat real food: lots of colorful vegetables (half the plate) and fruits -5-7 servings of vegetables and fruits per day (fresh or steamed is best), exp. 2 servings of vegetables with lunch and dinner and 2 servings of fruit per day. Berries and greens such as kale and collards are great choices.  -consume on a regular basis: whole grains (make sure first ingredient on label contains the word whole), fresh fruits, fish, nuts, seeds, healthy oils (such as olive oil, avocado oil, grape seed oil) -may eat small amounts of dairy and lean meat on occasion, but avoid processed  meats such as ham, bacon, lunch meat, etc. -drink water -try to avoid fast food and pre-packaged foods, processed meat -most experts advise limiting sodium to < 2300mg  per day, should limit further is any chronic conditions such as high blood pressure, heart disease, diabetes, etc. The American Heart Association advised that < 1500mg  is is ideal -try to avoid foods that contain any ingredients with names you do not recognize  -try to avoid sugar/sweets (except for the natural sugar that occurs in fresh fruit) -try to avoid sweet drinks -try to avoid white rice, white bread, pasta (unless whole grain), white or yellow potatoes  EXERCISE GUIDELINES FOR ADULTS: -if you wish to increase your physical activity, do so gradually and with the approval of your doctor -STOP and seek medical care immediately if you have any chest pain, chest discomfort or trouble breathing when starting or increasing exercise  -move and stretch your body, legs, feet and arms when sitting for long periods -Physical activity guidelines for optimal health in adults: -least 150 minutes per week of aerobic exercise (can talk, but not sing) once approved by your doctor, 20-30 minutes of sustained activity or two 10 minute episodes of sustained activity every day.  -resistance training at least 2 days per week if approved by your doctor -balance exercises 3+ days per week:   Stand somewhere where you have something sturdy to hold onto if you lose balance.    1) lift up on toes, start with 5x per day and work up to 20x   2) stand and lift on leg straight out to the side so that foot is a few inches of the floor, start with 5x each side and work up to 20x each side   3) stand on one foot, start with 5 seconds each side and work up to 20 seconds on each side  If you need ideas or help with getting more active:  -Silver sneakers https://tools.silversneakers.com  -Walk with a Doc: Http://www.duncan-williams.com/  -try to include  resistance (weight lifting/strength building) and balance exercises twice per week: or the following link for ideas: http://castillo-powell.com/  buyducts.dk  STRESS MANAGEMENT: -can try meditating, or just sitting quietly with deep breathing while intentionally relaxing all parts of your body for 5 minutes daily -if you need further help with stress, anxiety or depression please follow up with your primary doctor or contact the wonderful folks at [facility]: [phone]  SOCIAL CONNECTIONS: -options in [place] if you wish to engage in more social and exercise related activities:  -Silver sneakers https://tools.silversneakers.com  -Walk with a Doc: Http://www.duncan-williams.com/  -Check out the [facility] Active Adults 50+ section on the [place] of [employer] companies (hiking clubs, book clubs,  cards and games, chess, exercise classes, aquatic classes and much more) - see the website for details: https://www.[place].gov/departments/parks-recreation/active-adults50  -YouTube has lots of exercise videos for different ages and abilities as well  -[facility] (a variety of indoor and outdoor inperson activities for adults). [phone]. [address]  -Virtual Online Classes (a variety of topics): see seniorplanet.org or call [phone]) [phone]  -consider volunteering at a school, hospice center, church, senior center or elsewhere   ADVANCED HEALTHCARE DIRECTIVES:  [place] Advanced Directives assistance:   expressweek.com.cy  Everyone should have advanced health care directives in place. This is so that you get the care you want, should you ever be in a situation where you are unable to make your own medical decisions.   From the [place] Advanced Directive Website: Advance Health Care Directives are legal documents  in which you give written instructions about your health care if, in the future, you cannot speak for yourself.   A health care power of attorney allows you to name a person you trust to make your health care decisions if you cannot make them yourself. A declaration of a desire for a natural death (or living will) is document, which states that you desire not to have your life prolonged by extraordinary measures if you have a terminal or incurable illness or if you are in a vegetative state. An advance instruction for mental health treatment makes a declaration of instructions, information and preferences regarding your mental health treatment. It also states that you are aware that the advance instruction authorizes a mental health treatment provider to act according to your wishes. It may also outline your consent or refusal of mental health treatment. A declaration of an anatomical gift allows anyone over the age of 24 to make a gift by will, organ donor card or other document.   Please see the following website or an elder law attorney for forms, FAQs and for completion of advanced directives: [place]  Print Production Planner Health Care Directives Advance Health Care Directives (http://guzman.com/)  Or copy and paste the following to your web browser: Poshchat.fi          Chiquita JONELLE Cramp, DO

## 2023-06-25 NOTE — Patient Instructions (Addendum)
 I really enjoyed getting to talk with you today! I am available on Tuesdays and Thursdays for virtual visits if you have any questions or concerns, or if I can be of any further assistance.   CHECKLIST FROM ANNUAL WELLNESS VISIT:  -Follow up (please call to schedule if not scheduled after visit):   -yearly for annual wellness visit with primary care office  Here is a list of your preventive care/health maintenance measures and the plan for each if any are due:  PLAN For any measures below that may be due:   Health Maintenance  Topic Date Due   Hepatitis C Screening  Never done   COVID-19 Vaccine (3 - Pfizer risk series) 07/11/2023 (Originally 11/11/2019)   Zoster Vaccines- Shingrix (1 of 2) 09/23/2023 (Originally 08/[birth year]/1975)   Medicare Annual Wellness (AWV)  06/24/2024   Colonoscopy  12/27/2025   DTaP/Tdap/Td (3 - Td or Tdap) 09/24/2032   Pneumonia Vaccine 78+ Years old  Completed   HPV VACCINES  Aged Out    -See a dentist at least yearly  -Get your eyes checked and then per your eye specialist's recommendations  -Other issues addressed today:   -I have included below further information regarding a healthy whole foods based diet, physical activity guidelines for adults, stress management and opportunities for social connections. I hope you find this information useful.   -----------------------------------------------------------------------------------------------------------------------------------------------------------------------------------------------------------------------------------------------------------  NUTRITION: -eat real food: lots of colorful vegetables (half the plate) and fruits -5-7 servings of vegetables and fruits per day (fresh or steamed is best), exp. 2 servings of vegetables with lunch and dinner and 2 servings of fruit per day. Berries and greens such as kale and collards are great choices.  -consume on a regular basis: whole grains (make sure first  ingredient on label contains the word whole), fresh fruits, fish, nuts, seeds, healthy oils (such as olive oil, avocado oil, grape seed oil) -may eat small amounts of dairy and lean meat on occasion, but avoid processed meats such as ham, bacon, lunch meat, etc. -drink water -try to avoid fast food and pre-packaged foods, processed meat -most experts advise limiting sodium to < 2300mg  per day, should limit further is any chronic conditions such as high blood pressure, heart disease, diabetes, etc. The American Heart Association advised that < 1500mg  is is ideal -try to avoid foods that contain any ingredients with names you do not recognize  -try to avoid sugar/sweets (except for the natural sugar that occurs in fresh fruit) -try to avoid sweet drinks -try to avoid white rice, white bread, pasta (unless whole grain), white or yellow potatoes  EXERCISE GUIDELINES FOR ADULTS: -if you wish to increase your physical activity, do so gradually and with the approval of your doctor -STOP and seek medical care immediately if you have any chest pain, chest discomfort or trouble breathing when starting or increasing exercise  -move and stretch your body, legs, feet and arms when sitting for long periods -Physical activity guidelines for optimal health in adults: -least 150 minutes per week of aerobic exercise (can talk, but not sing) once approved by your doctor, 20-30 minutes of sustained activity or two 10 minute episodes of sustained activity every day.  -resistance training at least 2 days per week if approved by your doctor -balance exercises 3+ days per week:   Stand somewhere where you have something sturdy to hold onto if you lose balance.    1) lift up on toes, start with 5x per day and work up to 20x  2) stand and lift on leg straight out to the side so that foot is a few inches of the floor, start with 5x each side and work up to 20x each side   3) stand on one foot, start with 5 seconds each  side and work up to 20 seconds on each side  If you need ideas or help with getting more active:  -Silver sneakers https://tools.silversneakers.com  -Walk with a Doc: Http://www.duncan-williams.com/  -try to include resistance (weight lifting/strength building) and balance exercises twice per week: or the following link for ideas: http://castillo-powell.com/  buyducts.dk  STRESS MANAGEMENT: -can try meditating, or just sitting quietly with deep breathing while intentionally relaxing all parts of your body for 5 minutes daily -if you need further help with stress, anxiety or depression please follow up with your primary doctor or contact the wonderful folks at [facility]: [phone]  SOCIAL CONNECTIONS: -options in [place] if you wish to engage in more social and exercise related activities:  -Silver sneakers https://tools.silversneakers.com  -Walk with a Doc: Http://www.duncan-williams.com/  -Check out the [facility] Active Adults 50+ section on the [place] of [employer] companies (hiking clubs, book clubs, cards and games, chess, exercise classes, aquatic classes and much more) - see the website for details: https://www.[place].gov/departments/parks-recreation/active-adults50  -YouTube has lots of exercise videos for different ages and abilities as well  -[facility] (a variety of indoor and outdoor inperson activities for adults). 8484924441. [address]  -Virtual Online Classes (a variety of topics): see seniorplanet.org or call [phone]  -consider volunteering at a school, hospice center, church, senior center or elsewhere   ADVANCED HEALTHCARE DIRECTIVES:  [place] Advanced Directives assistance:   expressweek.com.cy  Everyone should have advanced health care directives in place. This is so  that you get the care you want, should you ever be in a situation where you are unable to make your own medical decisions.   From the [place] Advanced Directive Website: Advance Health Care Directives are legal documents in which you give written instructions about your health care if, in the future, you cannot speak for yourself.   A health care power of attorney allows you to name a person you trust to make your health care decisions if you cannot make them yourself. A declaration of a desire for a natural death (or living will) is document, which states that you desire not to have your life prolonged by extraordinary measures if you have a terminal or incurable illness or if you are in a vegetative state. An advance instruction for mental health treatment makes a declaration of instructions, information and preferences regarding your mental health treatment. It also states that you are aware that the advance instruction authorizes a mental health treatment provider to act according to your wishes. It may also outline your consent or refusal of mental health treatment. A declaration of an anatomical gift allows anyone over the age of 67 to make a gift by will, organ donor card or other document.   Please see the following website or an elder law attorney for forms, FAQs and for completion of advanced directives: [place]  Print [employer] Planner Health Care Directives Advance Health Care Directives (http://guzman.com/)  Or copy and paste the following to your web browser: Poshchat.fi

## 2023-06-25 NOTE — Progress Notes (Signed)
 Patient was unable to self-report due to a lack of equipment at home via telehealth

## 2023-06-30 ENCOUNTER — Other Ambulatory Visit: Payer: Self-pay | Admitting: Family Medicine

## 2023-07-01 ENCOUNTER — Ambulatory Visit: Payer: Medicare Other | Admitting: Cardiovascular Disease

## 2023-07-16 ENCOUNTER — Encounter: Payer: Self-pay | Admitting: Family Medicine

## 2023-07-16 ENCOUNTER — Ambulatory Visit (INDEPENDENT_AMBULATORY_CARE_PROVIDER_SITE_OTHER): Payer: Medicare Other | Admitting: Family Medicine

## 2023-07-16 VITALS — BP 116/70 | HR 63 | Temp 98.1°F | Ht 68.0 in | Wt 143.8 lb

## 2023-07-16 DIAGNOSIS — R09A2 Foreign body sensation, throat: Secondary | ICD-10-CM | POA: Diagnosis not present

## 2023-07-16 DIAGNOSIS — E785 Hyperlipidemia, unspecified: Secondary | ICD-10-CM | POA: Diagnosis not present

## 2023-07-16 DIAGNOSIS — E7800 Pure hypercholesterolemia, unspecified: Secondary | ICD-10-CM

## 2023-07-16 DIAGNOSIS — I10 Essential (primary) hypertension: Secondary | ICD-10-CM | POA: Diagnosis not present

## 2023-07-16 NOTE — Progress Notes (Signed)
Established Patient Office Visit  Subjective   Patient ID: [name] Sosa, male    DOB: May 03, 1956  Age: 68 y.o. MRN: [id number]  Chief Complaint  Patient presents with   Medical Management of Chronic Issues    Follow-up labs and potassium     HPI   [name] is seen today for medical follow-up.  He has history of CAD, peripheral vascular disease, migraine headaches, systolic heart failure, hypertension, history of CVA, history of vestibular migraines.  Takes CGRP injectable with Emgality but is still having breakthrough migraine headaches.  Headaches are followed by [facility] neurology.  He has hypertension treated with Benicar 5 mg 2 tablets daily.  Also takes low-dose carvedilol.  Blood pressure generally well-controlled.  He does have occasional readings 134 -144 systolic but diastolics are consistently 70s.  Couple times he took extra dose of Benicar.  No recent chest pains.  Generally feels well.  Has had a couple episodes where he woke up feeling dyspneic out of his sleep.  No pain.  Had difficulty getting his breath at first and taking a deep breath.  No obvious reflux symptoms.  Denies any active heartburn symptoms.  Also describes globus sensation lower neck just below cricothyroid cartilage.  No dysphagia with food.  Past Medical History:  Diagnosis Date   CAD (coronary artery disease)    s/p 7 vessel CABG 2000 at [facility] // Echo 11/2018: EF 45-50, antero-apical and inferior akinesis // Myoview 11/2018: EF 51, distal ant/apical infarct and small inferior infarct with very mild peri-infarct ischemia; Low Risk   Carotid artery occlusion    High cholesterol    Hyperlipidemia    Hypertension    Pre-diabetes    < 6.1 2017   Stroke (HCC) 04/2016   TIA- 04/2016 double vision 2nd - speech - no residual   Trochlear neuropathy, 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   Past Surgical History:  Procedure Laterality Date   4v cabg     CARDIAC CATHETERIZATION     CARDIAC CATHETERIZATION N/A 03/23/2016   Procedure:  Left Heart Cath and Cors/Grafts Angiography;  Surgeon: Kathleene Hazel, MD;  Location: [facility] INVASIVE CV LAB;  Service: Cardiovascular;  Laterality: N/A;   CAROTID ANGIOGRAPHY N/A 10/11/2016   Procedure: Carotid Angiography / Cerebral angiogram;  Surgeon: Fransisco Hertz, MD;  Location: [facility] INVASIVE CV LAB;  Service: Cardiovascular;  Laterality: N/A;   CAROTID ENDARTERECTOMY     COLONOSCOPY  2017   COLONOSCOPY W/ POLYPECTOMY     CORONARY ARTERY BYPASS GRAFT     ENDARTERECTOMY Left 10/16/2016   Procedure: LEFT CAROTID ENDARTERECTOMY WITH PATCH ANGIOPLASTY;  Surgeon: Fransisco Hertz, MD;  Location: [facility] OR;  Service: Vascular;  Laterality: Left;   LEFT HEART CATH AND CORS/GRAFTS ANGIOGRAPHY N/A 01/19/2021   Procedure: LEFT HEART CATH AND CORS/GRAFTS ANGIOGRAPHY;  Surgeon: Kathleene Hazel, MD;  Location: [facility] INVASIVE CV LAB;  Service: Cardiovascular;  Laterality: N/A;   patent grafts     s/p 7     UPPER GASTROINTESTINAL ENDOSCOPY      reports that he quit smoking about 25 years ago. His smoking use included cigarettes. He started smoking about 27 years ago. He has never used smokeless tobacco. He reports that he does not drink alcohol and does not use drugs. family history includes Heart disease in his father and mother; Heart failure in his father and mother; Hypertension in his brother, father, mother, and sister. Allergies  Allergen Reactions   Amlodipine Itching    Chest pain  Chest pain  Chest pain  Chest pain   Chest pain   Lisinopril Cough    Other reaction(s): Cough    Review of Systems  Constitutional:  Negative for malaise/fatigue.  HENT:  Negative for sore throat.   Eyes:  Negative for blurred vision.  Respiratory:  Negative for shortness of breath.   Cardiovascular:  Negative for chest pain.  Neurological:  Negative for dizziness, weakness and headaches.      Objective:     BP 116/70 (BP Location: Right Arm, Patient Position: Sitting, Cuff Size: Small)   Pulse 63    Temp 98.1 F (36.7 C) (Oral)   Ht 5\' 8"  (1.727 m)   Wt 143 lb 12.8 oz (65.2 kg)   SpO2 97%   BMI 21.86 kg/m  BP Readings from Last 3 Encounters:  07/16/23 116/70  05/15/23 134/86  03/15/23 126/60   Wt Readings from Last 3 Encounters:  07/16/23 143 lb 12.8 oz (65.2 kg)  05/15/23 141 lb 6.4 oz (64.1 kg)  03/15/23 137 lb 8 oz (62.4 kg)      Physical Exam Vitals reviewed.  Constitutional:      General: He is not in acute distress.    Appearance: Normal appearance. He is not ill-appearing.  HENT:     Head: Normocephalic and atraumatic.  Neck:     Comments: No neck adenopathy.  Slight fullness right thyroid region but no distinct masses palpated.  Nontender. Cardiovascular:     Rate and Rhythm: Normal rate and regular rhythm.  Pulmonary:     Effort: Pulmonary effort is normal.     Breath sounds: No wheezing or rales.  Musculoskeletal:     Cervical back: Neck supple.     Right lower leg: No edema.     Left lower leg: No edema.  Lymphadenopathy:     Cervical: No cervical adenopathy.  Neurological:     Mental Status: He is alert.      No results found for any visits on 07/16/23.  Last CBC Lab Results  Component Value Date   WBC 5.8 10/10/2021   HGB 14.8 10/10/2021   HCT 43.3 10/10/2021   MCV 94 10/10/2021   MCH 32.0 10/10/2021   RDW 11.9 10/10/2021   PLT 246 10/10/2021   Last metabolic panel Lab Results  Component Value Date   GLUCOSE 122 (H) 04/03/2023   NA 135 04/03/2023   K 4.8 04/03/2023   CL 99 04/03/2023   CO2 29 04/03/2023   BUN 21 04/03/2023   CREATININE 0.96 04/03/2023   GFR 82.31 04/03/2023   CALCIUM 9.4 04/03/2023   PROT 6.8 08/31/2022   ALBUMIN 4.0 08/31/2022   LABGLOB 2.2 10/10/2021   AGRATIO 2.0 10/10/2021   BILITOT 0.7 08/31/2022   ALKPHOS 44 08/31/2022   AST 25 08/31/2022   ALT 20 08/31/2022   ANIONGAP 7 10/17/2016   Last lipids Lab Results  Component Value Date   CHOL 89 01/04/2023   HDL 50 01/04/2023   LDLCALC 21  01/04/2023   TRIG 93 01/04/2023   CHOLHDL 1.8 01/04/2023      The ASCVD Risk score (Arnett DK, et al., 2019) failed to calculate for the following reasons:   Risk score cannot be calculated because patient has a medical history suggesting prior/existing ASCVD    Assessment & Plan:   #1 hypertension stable and fairly well-controlled.  Continue Benicar 5 mg 2 tablets daily.  Continue close monitoring.  Future lab order placed for basic metabolic panel  #2 sensation of globus  lower neck region.  No dysphagia.  Slight fullness right thyroid region but no distinct mass.  No neck adenopathy noted.  Set up neck ultrasound to further assess  #3 intermittent episodes of transient dyspnea waking out of sleep.  No observed sleep apnea.  Question GERD related. -He is already elevated head of bed several inches. -Avoid eating within 3 hours of bedtime -Consider short-term trial of antiacid if symptoms persist  No follow-ups on file.    Evelena Peat, MD

## 2023-07-16 NOTE — Patient Instructions (Signed)
I will set up ultrasound of neck to further assess neck fullness symptoms

## 2023-07-19 ENCOUNTER — Ambulatory Visit
Admission: RE | Admit: 2023-07-19 | Discharge: 2023-07-19 | Disposition: A | Discharge status: Home / Self Care | Payer: Medicare Other | Source: Ambulatory Visit | Attending: Family Medicine | Admitting: Family Medicine

## 2023-07-19 DIAGNOSIS — R09A2 Foreign body sensation, throat: Secondary | ICD-10-CM

## 2023-07-22 ENCOUNTER — Telehealth: Payer: Self-pay

## 2023-07-22 NOTE — Telephone Encounter (Signed)
Copied from CRM [phone]. Topic: General - Other >> Jul 22, 2023 11:56 AM [place] A wrote: Reason for CRM: Patietn was returning "Kathlene November" call-please call back when available

## 2023-07-22 NOTE — Addendum Note (Signed)
Addended by: Christy Sartorius on: 07/22/2023 02:36 PM   Modules accepted: Orders

## 2023-07-22 NOTE — Telephone Encounter (Signed)
 Please see result note

## 2023-07-24 DIAGNOSIS — H25813 Combined forms of age-related cataract, bilateral: Secondary | ICD-10-CM | POA: Diagnosis not present

## 2023-07-24 DIAGNOSIS — H35362 Drusen (degenerative) of macula, left eye: Secondary | ICD-10-CM | POA: Diagnosis not present

## 2023-07-30 ENCOUNTER — Other Ambulatory Visit (INDEPENDENT_AMBULATORY_CARE_PROVIDER_SITE_OTHER): Payer: Medicare Other

## 2023-07-30 DIAGNOSIS — I10 Essential (primary) hypertension: Secondary | ICD-10-CM

## 2023-07-30 LAB — BASIC METABOLIC PANEL
BUN: 19 mg/dL (ref 6–23)
CO2: 30 meq/L (ref 19–32)
Calcium: 9.2 mg/dL (ref 8.4–10.5)
Chloride: 100 meq/L (ref 96–112)
Creatinine, Ser: 0.86 mg/dL (ref 0.40–1.50)
GFR: 89.96 mL/min (ref >60.00)
Glucose, Bld: 98 mg/dL (ref 70–99)
Potassium: 4.1 meq/L (ref 3.5–5.1)
Sodium: 136 meq/L (ref 135–145)

## 2023-08-05 ENCOUNTER — Encounter: Payer: Self-pay | Admitting: Family Medicine

## 2023-08-05 DIAGNOSIS — I739 Peripheral vascular disease, unspecified: Secondary | ICD-10-CM

## 2023-08-06 NOTE — Telephone Encounter (Signed)
[facility] is scheduling out so I re-routed the referral to [facility].

## 2023-08-16 ENCOUNTER — Telehealth: Payer: Self-pay | Admitting: Pediatrics

## 2023-08-16 NOTE — Telephone Encounter (Signed)
 Good afternoon Dr. Doy Hutching,   Supervising Provider PM 08/16/23   Patient called wishing to be seen for esophageal pressure and to discuss having an endoscopy and colonoscopy. Patient last had a colonoscopy in 2022 with [facility].  Wishing to transfer care due to wanting to have all providers within the [facility]. Patient's previous records are in Epic under procedure tab for you to review and advise on scheduling.   Thank you.

## 2023-08-19 ENCOUNTER — Encounter: Payer: Self-pay | Admitting: Pediatrics

## 2023-08-20 NOTE — Telephone Encounter (Signed)
Patient has been scheduled for 5/6.

## 2023-08-27 ENCOUNTER — Encounter (HOSPITAL_COMMUNITY): Payer: Medicare Other

## 2023-08-27 ENCOUNTER — Ambulatory Visit: Payer: Medicare Other | Admitting: Vascular Surgery

## 2023-08-30 ENCOUNTER — Other Ambulatory Visit: Payer: Self-pay | Admitting: *Deleted

## 2023-08-30 DIAGNOSIS — M79606 Pain in leg, unspecified: Secondary | ICD-10-CM

## 2023-09-03 DIAGNOSIS — G43709 Chronic migraine without aura, not intractable, without status migrainosus: Secondary | ICD-10-CM | POA: Diagnosis not present

## 2023-09-04 ENCOUNTER — Other Ambulatory Visit: Payer: Self-pay | Admitting: Family Medicine

## 2023-09-09 NOTE — Progress Notes (Unsigned)
 [facility] [place]  ASSESSMENT / PLAN: [name] Sosa is a 68 y.o. male with asymptomatic right 65% carotid artery stenosis. 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  The patient should continue best medical therapy for carotid artery stenosis including: Complete cessation from all tobacco products. Blood glucose control with goal A1c < 7%. Blood pressure control with goal blood pressure < 140/90 mmHg. Lipid reduction therapy with goal LDL-C <100 mg/dL (<16 if symptomatic from carotid artery stenosis).  Aspirin 81mg  PO QD.  Atorvastatin 40-80mg  PO QD (or other "high intensity" statin therapy).  Patient counseled carotid disease very unlikely to cause dizziness or syncope. Follow up with me in 1 year with carotid duplex, AAA duplex.  CHIEF COMPLAINT: Recurrent syncope  HISTORY OF PRESENT ILLNESS: [name] Sosa is a 68 y.o. male who presents to clinic for evaluation of carotid artery stenosis.  The patient reports he has had multiple episodes of syncope over the past several months.  Exhaustive workup has been performed with no cause found to date.  Carotid artery stenosis and right vertebral artery stenosis was demonstrated on recent CT angiogram of his head and neck.  He presents today to discuss these findings as a possible cause for his symptoms.  We reviewed the natural history of carotid artery stenosis.  He specifically denies any typical stroke or mini stroke symptoms attributable to a carotid him.  He specifically denies amaurosis, unilateral weakness or numbness, facial drooping, difficulty speaking.  He has been told that his syncope is vasovagal in origin.  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    Past Surgical History:  Procedure Laterality Date   4v cabg     CARDIAC CATHETERIZATION     CARDIAC CATHETERIZATION N/A 03/23/2016   Procedure: Left Heart Cath and Cors/Grafts Angiography;  Surgeon: Kathleene Hazel, MD;  Location: [facility] INVASIVE CV LAB;  Service: Cardiovascular;  Laterality: N/A;   CAROTID ANGIOGRAPHY N/A 10/11/2016   Procedure: Carotid Angiography / Cerebral angiogram;  Surgeon: Fransisco Hertz, MD;  Location: [facility] INVASIVE CV LAB;  Service: Cardiovascular;  Laterality: N/A;   CAROTID ENDARTERECTOMY     COLONOSCOPY  2017   COLONOSCOPY W/ POLYPECTOMY     CORONARY ARTERY BYPASS GRAFT     ENDARTERECTOMY Left 10/16/2016   Procedure: LEFT CAROTID ENDARTERECTOMY WITH PATCH ANGIOPLASTY;  Surgeon: Fransisco Hertz, MD;  Location: [facility] OR;  Service: Vascular;  Laterality: Left;   LEFT HEART CATH AND CORS/GRAFTS ANGIOGRAPHY N/A 01/19/2021   Procedure: LEFT HEART CATH AND CORS/GRAFTS ANGIOGRAPHY;  Surgeon: Kathleene Hazel, MD;  Location: [facility] INVASIVE CV LAB;  Service: Cardiovascular;  Laterality: N/A;   patent grafts     s/p 7     UPPER GASTROINTESTINAL ENDOSCOPY      Family History  Problem Relation Age of Onset   Heart failure Mother    Hypertension Mother    Heart disease Mother  Heart failure Father    Hypertension Father    Heart disease Father        before age 59   Hypertension Sister    Hypertension Brother    Heart attack Neg Hx    Stroke Neg Hx     Social History   Socioeconomic History   Marital status: Single    Spouse name: Not on file   Number of children: 3   Years of education: AS + 2   Highest education level: Associate degree: occupational, Scientist, product/process development, or vocational program  Occupational  History   Occupation: [employer]  Tobacco Use   Smoking status: Former    Current packs/day: 0.00    Types: Cigarettes    Start date: 06/19/1996    Quit date: 06/19/1998    Years since quitting: 25.2   Smokeless tobacco: Never  Vaping Use   Vaping status: Never Used  Substance and Sexual Activity   Alcohol use: No   Drug use: No   Sexual activity: Yes  Other Topics Concern   Not on file  Social History Narrative   Lives at home alone   Right-handed   Caffeine: 2 cups per day   Social Drivers of Health   Financial Resource Strain: Low Risk  (07/12/2023)   Overall Financial Resource Strain (CARDIA)    Difficulty of Paying Living Expenses: Not hard at all  Food Insecurity: No Food Insecurity (07/12/2023)   Hunger Vital Sign    Worried About Running Out of Food in the Last Year: Never true    Ran Out of Food in the Last Year: Never true  Transportation Needs: No Transportation Needs (07/12/2023)   PRAPARE - Administrator, Civil Service (Medical): No    Lack of Transportation (Non-Medical): No  Physical Activity: Insufficiently Active (07/12/2023)   Exercise Vital Sign    Days of Exercise per Week: 3 days    Minutes of Exercise per Session: 40 min  Stress: No Stress Concern Present (07/12/2023)   [employer] of Occupational Health - Occupational Stress Questionnaire    Feeling of Stress : Not at all  Social Connections: Unknown (07/12/2023)   Social Connection and Isolation Panel [NHANES]    Frequency of Communication with Friends and Family: More than three times a week    Frequency of Social Gatherings with Friends and Family: More than three times a week    Attends Religious Services: Never    Database administrator or Organizations: No    Attends Engineer, structural: Not on file    Marital Status: Not on file  Intimate Partner Violence: Unknown (09/21/2021)   Received from [employer], [facility]   HITS    Physically Hurt: Not on file    Insult  or Talk Down To: Not on file    Threaten Physical Harm: Not on file    Scream or Curse: Not on file    Allergies  Allergen Reactions   Amlodipine Itching    Chest pain  Chest pain     Chest pain  Chest pain   Chest pain   Lisinopril Cough    Other reaction(s): Cough    Current Outpatient Medications  Medication Sig Dispense Refill   ascorbic acid (VITAMIN C) 500 MG tablet Take 500 mg by mouth daily.     aspirin EC 81 MG tablet Take 1 tablet (81 mg total) by mouth daily. Swallow whole. 90 tablet 3   botulinum toxin Type A (BOTOX)  200 units injection Inject 200 Units into the muscle every 3 (three) months.     carvedilol (COREG) 3.125 MG tablet Take 3.125 mg by mouth 2 (two) times daily.     EMGALITY 120 MG/ML SOAJ Inject 120 mg subcutaneously every 28 (twenty-eight) days     Erenumab-aooe 70 MG/ML SOAJ Inject into the skin.     ezetimibe (ZETIA) 10 MG tablet Take 1 tablet (10 mg total) by mouth daily. 90 tablet 3   gabapentin (NEURONTIN) 300 MG capsule Take 300 mg by mouth 3 (three) times daily.     Multiple Vitamin (MULTIVITAMIN) tablet Take 1 tablet by mouth daily.     MYRBETRIQ 25 MG TB24 tablet Take 1 tablet by mouth once daily 90 tablet 0   nitroGLYCERIN (NITROSTAT) 0.4 MG SL tablet Place 1 tablet (0.4 mg total) under the tongue every 5 (five) minutes as needed for chest pain (up to 3 doses). 25 tablet 3   olmesartan (BENICAR) 5 MG tablet Take 2 tablets by mouth once daily 180 tablet 1   Omega-3 1000 MG CAPS Take 1 capsule by mouth daily.     ranolazine (RANEXA) 1000 MG SR tablet Take 1 tablet (1,000 mg total) by mouth 2 (two) times daily. 180 tablet 3   UBRELVY 100 MG TABS Take by mouth.     Ubrogepant 50 MG TABS Take by mouth.     venlafaxine XR (EFFEXOR-XR) 75 MG 24 hr capsule Take 75 mg by mouth daily.     VITAMIN C, CALCIUM ASCORBATE, PO Take 250 mg by mouth daily.     No current facility-administered medications for this visit.    PHYSICAL EXAM There were no  vitals filed for this visit.   Middle-age man in no acute distress Well-healed left neck incision Palpable radial pulses bilaterally Regular rate and rhythm Unlabored breathing   PERTINENT LABORATORY AND RADIOLOGIC DATA  Most recent CBC    Latest Ref Rng & Units 10/10/2021   11:05 AM 01/12/2021   10:33 AM 11/28/2018    1:15 PM  CBC  WBC 3.4 - 10.8 x10E3/uL 5.8  6.6  5.9   Hemoglobin 13.0 - 17.7 g/dL 59.5  63.8  75.6   Hematocrit 37.5 - 51.0 % 43.3  42.2  42.6   Platelets 150 - 450 x10E3/uL 246  266  263      Most recent CMP    Latest Ref Rng & Units 07/30/2023   10:24 AM 04/03/2023    4:07 PM 03/15/2023    3:55 PM  CMP  Glucose 70 - 99 mg/dL 98  433  86   BUN 6 - 23 mg/dL 19  21  20    Creatinine 0.40 - 1.50 mg/dL 2.95  1.88  4.16   Sodium 135 - 145 mEq/L 136  135  140   Potassium 3.5 - 5.1 mEq/L 4.1  4.8  5.7   Chloride 96 - 112 mEq/L 100  99  98   CO2 19 - 32 mEq/L 30  29  26    Calcium 8.4 - 10.5 mg/dL 9.2  9.4  9.9     Renal function CrCl cannot be calculated (Patient's most recent lab result is older than the maximum 21 days allowed.).  Hgb A1c MFr Bld (%)  Date Value  10/10/2021 5.8 (H)    LDL Cholesterol (Calc)  Date Value Ref Range Status  01/04/2023 21 mg/dL (calc) Final    Comment:    Reference range: <100 . Desirable range <100 mg/dL for  primary prevention;   <70 mg/dL for patients with CHD or diabetic patients  with > or = 2 CHD risk factors. Marland Kitchen LDL-C is now calculated using the Martin-Hopkins  calculation, which is a validated novel method providing  better accuracy than the Friedewald equation in the  estimation of LDL-C.  Horald Pollen et al. Lenox Ahr. 1610;960(45): 2061-2068  (http://education.[facility].com/faq/FAQ164)      Vascular Imaging:  CT angiogram head / neck ([facility])  1.  Age-indeterminate critical stenosis versus short segment occlusion of  the V4 segment of left vertebral artery which reconstitutes and terminates  in a patent  PICA. This is new when compared to 03/08/2020.  2.  Cranial atherosclerotic disease which includes moderate stenosis of the  left PCA P3 segment and mild stenosis of the right PCA P3 segment, worse  from prior.  3.  60-65% stenosis at the right carotid bulb, similar to prior.  4.  Incompletely visualized apparent wall thickening and irregular luminal  dilatation of the proximal portions of the main pulmonary artery. Recommend  CTA chest angiogram for further evaluation.   CTA chest / abdomen / pelvis 1.  Normal appearance of the main pulmonary artery.  2.  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  3.  Patulous fluid-filled esophagus with distal wall thickening, which may  represent esophagitis.   Rande Brunt. Lenell Antu, MD FACS [facility] Phone Number: [phone] 09/09/2023 7:17 AM   Total time spent on preparing this encounter including chart review, data review, collecting history, examining the patient, coordinating care for this new patient, 60 minutes.  Portions of this report may have been transcribed using voice recognition software.  Every effort has been made to ensure accuracy; however, inadvertent computerized transcription errors may still be present.

## 2023-09-10 ENCOUNTER — Other Ambulatory Visit: Payer: Self-pay | Admitting: Nurse Practitioner

## 2023-09-10 ENCOUNTER — Ambulatory Visit (HOSPITAL_COMMUNITY)
Admission: RE | Admit: 2023-09-10 | Discharge: 2023-09-10 | Disposition: A | Discharge status: Home / Self Care | Payer: Medicare Other | Source: Ambulatory Visit | Attending: Vascular Surgery | Admitting: Vascular Surgery

## 2023-09-10 ENCOUNTER — Encounter: Payer: Self-pay | Admitting: Vascular Surgery

## 2023-09-10 ENCOUNTER — Other Ambulatory Visit: Payer: Self-pay | Admitting: Family Medicine

## 2023-09-10 ENCOUNTER — Ambulatory Visit: Payer: Medicare Other | Admitting: Vascular Surgery

## 2023-09-10 VITALS — BP 104/58 | HR 59 | Temp 97.9°F | Resp 20 | Ht 68.0 in | Wt 138.0 lb

## 2023-09-10 DIAGNOSIS — I872 Venous insufficiency (chronic) (peripheral): Secondary | ICD-10-CM

## 2023-09-10 DIAGNOSIS — M79606 Pain in leg, unspecified: Secondary | ICD-10-CM | POA: Insufficient documentation

## 2023-09-10 LAB — VAS US ABI WITH/WO TBI
Left ABI: 0.98
Right ABI: 0.99

## 2023-09-17 ENCOUNTER — Other Ambulatory Visit: Payer: Self-pay | Admitting: Family Medicine

## 2023-09-28 ENCOUNTER — Other Ambulatory Visit: Payer: Self-pay | Admitting: Cardiovascular Disease

## 2023-10-11 ENCOUNTER — Ambulatory Visit: Admitting: Family Medicine

## 2023-10-15 ENCOUNTER — Telehealth: Payer: Self-pay | Admitting: Cardiovascular Disease

## 2023-10-15 DIAGNOSIS — I25709 Atherosclerosis of coronary artery bypass graft(s), unspecified, with unspecified angina pectoris: Secondary | ICD-10-CM

## 2023-10-15 MED ORDER — NITROGLYCERIN 0.4 MG SL SUBL: 0.4000 mg | SUBLINGUAL_TABLET | SUBLINGUAL | 2 refills | Status: AC | PRN

## 2023-10-15 NOTE — Telephone Encounter (Signed)
*  STAT* If patient is at the pharmacy, call can be transferred to refill team.   1. Which medications need to be refilled? (please list name of each medication and dose if known) nitroGLYCERIN  (NITROSTAT ) 0.4 MG SL tablet   2. Which pharmacy/location (including street and city if local pharmacy) is medication to be sent to? [facility] [address] [place] - [address]   3. Do they need a 30 day or 90 day supply?  Standard supply  Patient says he is completely out of medication.

## 2023-10-15 NOTE — Telephone Encounter (Signed)
 RX sent to requested Pharmacy

## 2023-10-17 ENCOUNTER — Telehealth: Payer: Self-pay | Admitting: Cardiovascular Disease

## 2023-10-17 MED ORDER — PANTOPRAZOLE SODIUM 40 MG PO TBEC: DELAYED_RELEASE_TABLET | ORAL | 2 refills | Status: DC

## 2023-10-17 NOTE — Telephone Encounter (Signed)
 Pt is calling requesting a refill on medication pantoprazole . This medication was D/C, but pt stated that he has still been taking it. Pt would like a call back concerning this matter. Please address

## 2023-10-17 NOTE — Telephone Encounter (Signed)
 Spoke to patient he stated Slater Duncan NP prescribed Pantoprazole  40 mg to take daily as needed.Stated he needs a 30 day refill.Refill sent to his pharmacy.

## 2023-10-17 NOTE — Telephone Encounter (Signed)
*  STAT* If patient is at the pharmacy, call can be transferred to refill team.   1. Which medications need to be refilled? (please list name of each medication and dose if known) pantoprazole  (PROTONIX ) 40 MG tablet   2. Would you like to learn more about the convenience, safety, & potential cost savings by using the [facility]? No   3. Are you open to using the [facility] No   4. Which pharmacy/location (including street and city if local pharmacy) is medication to be sent to?[facility] [address], [place] - [address]   5. Do they need a 30 day or 90 day supply? 30 Day Supply  Pt is currently out of medication.

## 2023-10-22 ENCOUNTER — Ambulatory Visit (INDEPENDENT_AMBULATORY_CARE_PROVIDER_SITE_OTHER): Admitting: Pediatrics

## 2023-10-22 ENCOUNTER — Encounter: Payer: Self-pay | Admitting: Pediatrics

## 2023-10-22 ENCOUNTER — Telehealth: Payer: Self-pay

## 2023-10-22 VITALS — BP 104/64 | HR 67 | Ht 68.0 in | Wt 141.0 lb

## 2023-10-22 DIAGNOSIS — R070 Pain in throat: Secondary | ICD-10-CM | POA: Diagnosis not present

## 2023-10-22 DIAGNOSIS — Z800 Family history of malignant neoplasm of digestive organs: Secondary | ICD-10-CM | POA: Diagnosis not present

## 2023-10-22 DIAGNOSIS — Z860101 Personal history of adenomatous and serrated colon polyps: Secondary | ICD-10-CM | POA: Diagnosis not present

## 2023-10-22 DIAGNOSIS — K219 Gastro-esophageal reflux disease without esophagitis: Secondary | ICD-10-CM | POA: Diagnosis not present

## 2023-10-22 DIAGNOSIS — Z860100 Personal history of colon polyps, unspecified: Secondary | ICD-10-CM

## 2023-10-22 MED ORDER — NA SULFATE-K SULFATE-MG SULF 17.5-3.13-1.6 GM/177ML PO SOLN: 1.0000 | Freq: Once | ORAL | 0 refills | Status: AC

## 2023-10-22 NOTE — Telephone Encounter (Signed)
 [facility] Pre-operative Risk Assessment     Request for surgical clearance:     Endoscopy Procedure  What type of surgery is being performed?     Endoscopy/colonoscopy  When is this surgery scheduled?     11/25/2023  What type of clearance is required ?   Pharmacy  Are there any medications that need to be held prior to surgery and how long? Patient not on a blood thinner just needs basic cardiac clearance  Practice name and name of physician performing surgery?       Gastroenterology  What is your office phone and fax number?      Phone- [phone]  Fax- [phone]  Anesthesia type (None, local, MAC, general) ?       MAC   Please route your response to [place]

## 2023-10-22 NOTE — Patient Instructions (Signed)
 You have been scheduled for an endoscopy and colonoscopy. Please follow the written instructions given to you at your visit today.  If you use inhalers (even only as needed), please bring them with you on the day of your procedure.  DO NOT TAKE 7 DAYS PRIOR TO TEST- Trulicity (dulaglutide) Ozempic, Wegovy (semaglutide) Mounjaro (tirzepatide) Bydureon Bcise (exanatide extended release)  DO NOT TAKE 1 DAY PRIOR TO YOUR TEST Rybelsus (semaglutide) Adlyxin (lixisenatide) Victoza (liraglutide) Byetta (exanatide) ___________________________________________________________________________   _______________________________________________________  If your blood pressure at your visit was 140/90 or greater, please contact your primary care physician to follow up on this.  _______________________________________________________  If you are age 67 or older, your body mass index should be between 23-30. Your Body mass index is 21.44 kg/m. If this is out of the aforementioned range listed, please consider follow up with your Primary Care Provider.  If you are age 32 or younger, your body mass index should be between 19-25. Your Body mass index is 21.44 kg/m. If this is out of the aformentioned range listed, please consider follow up with your Primary Care Provider.   ________________________________________________________  The [facility] providers would like to encourage you to use MYCHART to communicate with providers for non-urgent requests or questions.  Due to long hold times on the telephone, sending your provider a message by [facility] may be a faster and more efficient way to get a response.  Please allow 48 business hours for a response.  Please remember that this is for non-urgent requests.  _______________________________________________________

## 2023-10-22 NOTE — Progress Notes (Unsigned)
 [name] Sosa [id number] 1956-02-12   Chief Complaint: Throat discomfort, sensation of something in throat  Referring Provider: Marquetta Sit, MD Primary GI MD: Dr. Yvone Herd (previous patient of Dr. Cleaster [facility])  HPI: [name] Sosa is a 68 y.o. male with past medical history of TIA 2017, CAD s/p 7 vessel CABG 2000 at [facility], ECHO 10/2021 with EF 55-60%, ectasia and chronic dissection of abdominal aorta, angina, hyperlipidemia, hypertension, who presents today to discuss endoscopic procedures.    Patient states he has had pressure and discomfort in his throat for about 2.5 years that is not worsening but has been persistent. This discomfort is present all throughout the day, every day, but does not wake him up at night. He has had some acid reflux over the last 1.5 years for which he takes pantoprazole  as needed. This resolves his symptoms within about 30 minutes. Reflux occurs about once a week.  He is taking ranolazine  for angina, and states that his reflux seemed to worsen when the dose was increased from 500mg  to 1000mg  about 1.5 years ago. He denies dysphagia or odynophagia. Denies abdominal pain or pyrosis. Denies vocal hoarseness.  He has nausea associated with migraines, but otherwise denies nausea or vomiting.  He reports having a viral illness a couple months ago, and since then has had a persistent cough, for which she is taking cough drops as needed.  He denies prior EGD or swallow studies.  Does report that he saw ENT about 2 years ago for evaluation of his hearing, and he mentioned his throat discomfort at that time but did not end up having a laryngoscopy.  He follows with cardiology annually, last visit with them was in November.  He does have chronic chest pain and reports having angina yesterday.  Denies blood thinner use, taking 81 mg aspirin .  Though he is not due for repeat colonoscopy at this time based on current guidelines, he would like to have this done early  based his family history of colon cancer in his father, who was diagnosed at age 62.  He reports having regular bowel movements daily, and denies diarrhea, constipation, melena, or bloody stools.   Previous GI Procedures/Imaging   Colonoscopy 12/27/2020 - Diverticulosis in the sigmoid colon and in the descending colon -Two 2 to 3 mm polyps in the transverse colon and in the cecum, removed with a cold biopsy forceps.  Resected and retrieved. -One 5 mm polyp in the descending colon, removed with a cold snare.  Resected and retrieved. - The distal rectum and anal verge are normal on retroflexion view. -The exam was otherwise normal. - 5-year recall recommended based on path report  Pathology: - Tubular adenomas, polypoid colonic mucosa   Past Medical History:  Diagnosis Date   CAD (coronary artery disease)    s/p 7 vessel CABG 2000 at [facility] // Echo 11/2018: EF 45-50, antero-apical and inferior akinesis // Myoview  11/2018: EF 51, distal ant/apical infarct and small inferior infarct with very mild peri-infarct ischemia; Low Risk   Carotid artery occlusion    High cholesterol    Hyperlipidemia    Hypertension    Pre-diabetes    < 6.1 2017   Stroke (HCC) 04/2016   TIA- 04/2016 double vision 2nd - speech - no residual   Trochlear neuropathy, 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    Past Surgical History:  Procedure Laterality Date   4v cabg     CARDIAC CATHETERIZATION     CARDIAC CATHETERIZATION N/A 03/23/2016   Procedure: Left  Heart Cath and Cors/Grafts Angiography;  Surgeon: Odie Benne, MD;  Location: [facility] INVASIVE CV LAB;  Service: Cardiovascular;  Laterality: N/A;   CAROTID ANGIOGRAPHY N/A 10/11/2016   Procedure: Carotid Angiography / Cerebral angiogram;  Surgeon: Arvil Lauber, MD;  Location: [facility] INVASIVE CV LAB;  Service: Cardiovascular;  Laterality: N/A;   CAROTID ENDARTERECTOMY     COLONOSCOPY  2017   COLONOSCOPY W/ POLYPECTOMY     CORONARY ARTERY BYPASS GRAFT     ENDARTERECTOMY Left  10/16/2016   Procedure: LEFT CAROTID ENDARTERECTOMY WITH PATCH ANGIOPLASTY;  Surgeon: Arvil Lauber, MD;  Location: [facility] OR;  Service: Vascular;  Laterality: Left;   LEFT HEART CATH AND CORS/GRAFTS ANGIOGRAPHY N/A 01/19/2021   Procedure: LEFT HEART CATH AND CORS/GRAFTS ANGIOGRAPHY;  Surgeon: Odie Benne, MD;  Location: [facility] INVASIVE CV LAB;  Service: Cardiovascular;  Laterality: N/A;   patent grafts     s/p 7     UPPER GASTROINTESTINAL ENDOSCOPY      Current Outpatient Medications  Medication Sig Dispense Refill   ascorbic acid (VITAMIN C) 500 MG tablet Take 500 mg by mouth daily.     aspirin  EC 81 MG tablet Take 1 tablet (81 mg total) by mouth daily. Swallow whole. 90 tablet 3   botulinum toxin Type A (BOTOX) 200 units injection Inject 200 Units into the muscle every 3 (three) months.     carvedilol  (COREG ) 3.125 MG tablet TAKE 1 TABLET BY MOUTH TWICE DAILY WITH A MEAL 180 tablet 2   EMGALITY 120 MG/ML SOAJ Inject 120 mg subcutaneously every 28 (twenty-eight) days     ezetimibe  (ZETIA ) 10 MG tablet Take 1 tablet by mouth once daily 90 tablet 0   Multiple Vitamin (MULTIVITAMIN) tablet Take 1 tablet by mouth daily.     MYRBETRIQ  25 MG TB24 tablet Take 1 tablet by mouth once daily 90 tablet 0   nitroGLYCERIN  (NITROSTAT ) 0.4 MG SL tablet Place 1 tablet (0.4 mg total) under the tongue every 5 (five) minutes as needed for chest pain (up to 3 doses). 75 tablet 2   olmesartan  (BENICAR ) 5 MG tablet Take 2 tablets by mouth once daily 180 tablet 1   Omega-3 1000 MG CAPS Take 1 capsule by mouth daily.     pantoprazole  (PROTONIX ) 40 MG tablet Take 40 mg daily as needed 30 tablet 2   ranolazine  (RANEXA ) 1000 MG SR tablet Take 1 tablet by mouth twice daily 180 tablet 2   UBRELVY 100 MG TABS Take by mouth.     venlafaxine  XR (EFFEXOR -XR) 75 MG 24 hr capsule Take 75 mg by mouth daily.     VITAMIN C, CALCIUM  ASCORBATE, PO Take 250 mg by mouth daily.     No current facility-administered medications for  this visit.    Allergies as of 10/22/2023 - Review Complete 09/10/2023  Allergen Reaction Noted   Amlodipine  Itching 12/01/2018   Lisinopril  Cough 01/24/2017    Family History  Problem Relation Age of Onset   Heart failure Mother    Hypertension Mother    Heart disease Mother    Heart failure Father    Hypertension Father    Heart disease Father        before age 32   Hypertension Sister    Hypertension Brother    Heart attack Neg Hx    Stroke Neg Hx     Social History   Tobacco Use   Smoking status: Former    Current packs/day: 0.00  Types: Cigarettes    Start date: 06/19/1996    Quit date: 06/19/1998    Years since quitting: 25.3   Smokeless tobacco: Never  Vaping Use   Vaping status: Never Used  Substance Use Topics   Alcohol use: No   Drug use: No     Review of Systems:    Constitutional: No weight loss, fever, chills, weakness or fatigue HEENT: Eyes: No change in vision Ears, Nose, Throat:  No change in hearing or congestion Skin: No rash or itching Cardiovascular: Positive for chest pain. No chest pressure or palpitations   Respiratory: No SOB. Intermittent cough for the last 2 months. Gastrointestinal: See HPI and otherwise negative Genitourinary: No dysuria or change in urinary frequency Neurological: No headache, dizziness or syncope Musculoskeletal: No new muscle or joint pain Hematologic: No bleeding or bruising    Physical Exam:  Vital signs: BP 104/64   Pulse 67   Ht 5\' 8"  (1.727 m)   Wt 141 lb (64 kg)   BMI 21.44 kg/m    Constitutional: NAD, Well developed, Well nourished, alert and cooperative Head:  Normocephalic and atraumatic. Eyes:  EOMs intact. No scleral icterus. Conjunctiva pink. Throat/Neck: No thyromegaly.  No oral lesions noted. Respiratory: Respirations even and unlabored. Lungs clear to auscultation bilaterally.  No wheezes, crackles, or rhonchi.  Cardiovascular:  Regular rate and rhythm. No peripheral edema, cyanosis or  pallor.  Gastrointestinal:  Soft, nondistended, nontender. No rebound or guarding. Normal bowel sounds. No appreciable masses or hepatomegaly. Rectal:  Not performed.  Msk:  Symmetrical without gross deformities. Without edema, no deformity or joint abnormality.  Neurologic:  Alert and oriented x4;  grossly normal neurologically.  Skin:   Dry and intact without significant lesions or rashes. Psychiatric: Oriented to person, place and time. Demonstrates good judgement and reason without abnormal affect or behaviors.   RELEVANT LABS AND IMAGING: CBC    Component Value Date/Time   WBC 5.8 10/10/2021 1105   WBC 6.0 10/24/2016 1009   RBC 4.62 10/10/2021 1105   RBC 3.99 (L) 10/24/2016 1009   HGB 14.8 10/10/2021 1105   HCT 43.3 10/10/2021 1105   PLT 246 10/10/2021 1105   MCV 94 10/10/2021 1105   MCH 32.0 10/10/2021 1105   MCH 30.8 10/24/2016 1009   MCHC 34.2 10/10/2021 1105   MCHC 33.0 10/24/2016 1009   RDW 11.9 10/10/2021 1105   LYMPHSABS 1.5 10/10/2021 1105   MONOABS 420 10/24/2016 1009   EOSABS 0.2 10/10/2021 1105   BASOSABS 0.0 10/10/2021 1105    CMP     Component Value Date/Time   NA 136 07/30/2023 1024   NA 140 03/15/2023 1555   K 4.1 07/30/2023 1024   CL 100 07/30/2023 1024   CO2 30 07/30/2023 1024   GLUCOSE 98 07/30/2023 1024   BUN 19 07/30/2023 1024   BUN 20 03/15/2023 1555   CREATININE 0.86 07/30/2023 1024   CREATININE 0.68 (L) 01/04/2020 1553   CALCIUM  9.2 07/30/2023 1024   PROT 6.8 08/31/2022 1009   PROT 6.6 10/10/2021 1105   ALBUMIN 4.0 08/31/2022 1009   ALBUMIN 4.4 10/10/2021 1105   AST 25 08/31/2022 1009   ALT 20 08/31/2022 1009   ALKPHOS 44 08/31/2022 1009   BILITOT 0.7 08/31/2022 1009   BILITOT 0.5 10/10/2021 1105   GFRNONAA 97 11/28/2018 1315   GFRNONAA >89 01/24/2017 1023   GFRAA 112 11/28/2018 1315   GFRAA >89 01/24/2017 1023    Thyroid  US  07/19/23 -  1. No explanation for  patient's globus sensation. 2. Normal-sized and appearing thyroid   without discrete nodule or mass.  Labs through [facility] on 09/25/2022): - Normal CBC, hemoglobin 14.8, platelets 262 - TSH 0.93 - 11/08/2022, normal CRP and sed rate  Echo 11/02/2021 IMPRESSIONS  1. Akinesis of the basal inferior wall with overall normal LV function.  2. Left ventricular ejection fraction, by estimation, is 55 to 60% . The left ventricle has normal function. The left ventricle demonstrates regional wall motion abnormalities ( see scoring diagram/ findings for description) . Left ventricular diastolic parameters are consistent with Grade I diastolic dysfunction ( impaired relaxation) .  3. Right ventricular systolic function is normal. The right ventricular size is normal.  4. Left atrial size was mildly dilated.  5. The mitral valve is normal in structure. Mild mitral valve regurgitation. No evidence of mitral stenosis. 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  6. The aortic valve is tricuspid. Aortic valve regurgitation is not visualized. Aortic valve sclerosis is present, with no evidence of aortic valve stenosis. 7. Aortic dilatation noted. There is borderline dilatation of the aortic root, measuring 38 mm.  8. The inferior vena cava is normal in size with greater than 50% respiratory variability, suggesting right atrial pressure of 3 mmHg.  Assessment/Plan:   Throat discomfort Acid reflux Patient reports over 2 year history of throat discomfort and pressure. No prior EGD or laryngoscopy. Has been having acid reflux once a week and taking PPI as needed. He did have a normal thyroid  US  in January.  - Plan for EGD. I thoroughly discussed the procedure with the patient to include nature of the procedure, alternatives, benefits, and risks (including but not limited to bleeding, infection, perforation, anesthesia/cardiac/pulmonary complications). Patient verbalized understanding and  gave verbal consent to proceed with procedure.  - Advised to take pantoprazole  40mg  daily rather than prn. - Can consider CT neck as next step if EGD unremarkable.  History of adenomatous colon polyps Family history of colon cancer (father, age 86) Based on current guidelines, patient not due for repeat colonoscopy until 2027-2029.  He is concerned about his family history of colon cancer and personal history of adenomatous polyps and would like to have an earlier screening done. - Plan for earlier colonoscopy to be done with EGD, at patient's request.  Patient was advised that this may not be covered by his insurance.  He understands and will make the decision to proceed or cancel the colonoscopy depending on insurance coverage.  CAD s/p CABG  Carotid artery disease Patient with significant cardiac history, follows with cardiology annually, last visit with them was in November. He is on aspirin  81mg . Has AAA and carotid duplex scheduled for 5/16, follow up with vascular 12/03/23. -We will obtain cardiac clearance for procedures given patient's history.   [name] Gaul, PA-C [facility] 10/22/2023, 11:16 AM  I have reviewed the clinic note as outlined by [name] Gaul, PA and agree with the assessment, plan and medical decision making.  [name] Sosa presents for evaluation and management of throat discomfort, history of colon polyps and family history of colorectal cancer.  He reports a 2-1/2-year history of discomfort over his neck area just superior to the sternal notch.  Endorses discomfort if this area is palpated but denies dysphagia or odynophagia.  Has reflux symptoms once a week.  Takes PPI as needed.  Discussed whether or not his symptoms could reflect some degree of  acid reflux.  Agree with PPI recommendations and EGD.  Last colonoscopy in 2022 showed 3 polyps 2 of which were tubular adenomas and one reported as "polypoid mucosa".  A 5-year follow-up was recommended.  Patient is  requesting a sooner colonoscopy because he is very worried about his family history of colorectal cancer-father had colorectal cancer at age 68.  Advised that this could be ordered but may or may not be covered by insurance that is a sooner than previously advised interval.  [name] Sosa mentions today that he has fleeting symptoms of chest pain that he describes as "angina".  Last seen by cardiology in November 2024 and advised to have 1 year follow-up.  It is unclear to me if what he is describing is truly angina versus possible acid reflux.  Given that he will be undergoing anesthesia in the near future for his procedure I have advised cardiology clearance and further investigation of his chest pain before proceeding with EGD.  Eugenia Hess, MD   Patient Care Team: Marquetta Sit, MD as PCP - General (Family Medicine) Odie Benne, MD as PCP - Cardiology (Cardiology) Odie Benne, MD (Cardiology) Brian Campanile, MD (Inactive) as Consulting Physician (Neurology) Alver Austin, [facility] (Inactive) as Pharmacist (Pharmacist)

## 2023-10-23 ENCOUNTER — Encounter: Payer: Self-pay | Admitting: Pediatrics

## 2023-10-23 ENCOUNTER — Other Ambulatory Visit: Payer: Self-pay

## 2023-10-23 DIAGNOSIS — I7102 Dissection of abdominal aorta: Secondary | ICD-10-CM

## 2023-10-23 DIAGNOSIS — I6523 Occlusion and stenosis of bilateral carotid arteries: Secondary | ICD-10-CM

## 2023-10-23 NOTE — Telephone Encounter (Signed)
 Routed to demita

## 2023-10-23 NOTE — Telephone Encounter (Signed)
 I left a 2nd message to call for in office appt as it looks like pt called and scheduled testing to be done on 11/01/23, not sure why he was not scheduled for in office appt as well.   Pt still needs in office appt for preop clearance.

## 2023-10-23 NOTE — Telephone Encounter (Signed)
 Left message for the pt to call back and schedule IN OFFICE PREOP APPT.

## 2023-10-23 NOTE — Telephone Encounter (Signed)
 I am not sure why he was told there were no appt's. I will call the pt.

## 2023-10-23 NOTE — Telephone Encounter (Signed)
   Name: [name] Sosa  DOB: 1956/06/07  MRN: [id number]  Primary Cardiologist: Antoinette Batman, MD  Chart reviewed as part of pre-operative protocol coverage. Because of [name]'s past medical history and time since last visit, he will require a follow-up in-office visit in order to better assess preoperative cardiovascular risk.  Pre-op covering staff: - Please schedule appointment and call patient to inform them. If patient already had an upcoming appointment within acceptable timeframe, please add "pre-op clearance" to the appointment notes so provider is aware. - Please contact requesting surgeon's office via preferred method (i.e, phone, fax) to inform them of need for appointment prior to surgery.  Cyd Hostler D Cira Deyoe, NP  10/23/2023, 8:22 AM

## 2023-10-23 NOTE — Telephone Encounter (Signed)
 I spoke to Mr. [name] Sosa and said that he had missed the call.  He said when he scheduled the procedure they told him that there were no appointments available and put him on a wait list for a sooner appointment.  Please try patient again to discuss.

## 2023-10-23 NOTE — Telephone Encounter (Signed)
 I s/w the pt and informed him that I got a message from the GI office that our office told him there were no appts and they put him on a waitlist. I apologized that he was told this. Pt asked for an appt in the 1st week of June. Pt is aware his procedure is 11/25/23. Pt has been scheduled to see Palmer Bobo, NP 11/18/23. I will update all parties involved.

## 2023-11-01 ENCOUNTER — Ambulatory Visit (HOSPITAL_COMMUNITY)
Admission: RE | Admit: 2023-11-01 | Discharge: 2023-11-01 | Disposition: A | Discharge status: Home / Self Care | Source: Ambulatory Visit | Attending: Vascular Surgery | Admitting: Vascular Surgery

## 2023-11-01 ENCOUNTER — Ambulatory Visit (HOSPITAL_BASED_OUTPATIENT_CLINIC_OR_DEPARTMENT_OTHER)
Admission: RE | Admit: 2023-11-01 | Discharge: 2023-11-01 | Disposition: A | Discharge status: Home / Self Care | Source: Ambulatory Visit | Attending: Vascular Surgery | Admitting: Vascular Surgery

## 2023-11-01 DIAGNOSIS — I6523 Occlusion and stenosis of bilateral carotid arteries: Secondary | ICD-10-CM | POA: Insufficient documentation

## 2023-11-01 DIAGNOSIS — I7102 Dissection of abdominal aorta: Secondary | ICD-10-CM | POA: Diagnosis not present

## 2023-11-01 NOTE — Telephone Encounter (Signed)
 Dr. Yvone Herd, Mr. [name] Sosa says that the endoscopy is going to go towards his deductible and he would like to know if he could have the CT Scan.  He asked if the CT scan would give enough information like the endoscopy.

## 2023-11-01 NOTE — Telephone Encounter (Signed)
 Patient requesting f/u call in regards to upcoming procedure.

## 2023-11-01 NOTE — Telephone Encounter (Signed)
Is this something you were working on?

## 2023-11-07 NOTE — Telephone Encounter (Signed)
 Tried calling [name] Sosa back but no one answered and his voice mail was full.

## 2023-11-08 NOTE — Telephone Encounter (Signed)
 I spoke to [name] Sosa and I advised him of Dr. Jarold Merlin message.  I explained the differences and he stated that he will keep his scheduled procedure.

## 2023-11-08 NOTE — Telephone Encounter (Signed)
 PT returning call. Please advise.

## 2023-11-08 NOTE — Telephone Encounter (Signed)
 Lmtcb

## 2023-11-12 ENCOUNTER — Ambulatory Visit: Admitting: Vascular Surgery

## 2023-11-13 ENCOUNTER — Ambulatory Visit: Admitting: Physician Assistant

## 2023-11-14 ENCOUNTER — Encounter: Payer: Self-pay | Admitting: Pediatrics

## 2023-11-18 ENCOUNTER — Ambulatory Visit: Attending: Emergency Medicine | Admitting: Emergency Medicine

## 2023-11-18 ENCOUNTER — Encounter: Payer: Self-pay | Admitting: Emergency Medicine

## 2023-11-18 VITALS — BP 102/50 | HR 63 | Ht 68.0 in | Wt 140.0 lb

## 2023-11-18 DIAGNOSIS — I10 Essential (primary) hypertension: Secondary | ICD-10-CM

## 2023-11-18 DIAGNOSIS — E7800 Pure hypercholesterolemia, unspecified: Secondary | ICD-10-CM

## 2023-11-18 DIAGNOSIS — I6523 Occlusion and stenosis of bilateral carotid arteries: Secondary | ICD-10-CM | POA: Diagnosis not present

## 2023-11-18 DIAGNOSIS — I25118 Atherosclerotic heart disease of native coronary artery with other forms of angina pectoris: Secondary | ICD-10-CM

## 2023-11-18 DIAGNOSIS — Z01810 Encounter for preprocedural cardiovascular examination: Secondary | ICD-10-CM | POA: Diagnosis not present

## 2023-11-18 DIAGNOSIS — I5032 Chronic diastolic (congestive) heart failure: Secondary | ICD-10-CM

## 2023-11-18 NOTE — Patient Instructions (Addendum)
 Medication Instructions:  NO CHANGES    Lab Work: FASTING LIPID PANEL AND CMET TO BE DONE WHEN FASTING.  Testing/Procedures: NONE  Follow-Up: At [facility], you and your health needs are our priority.  As part of our continuing mission to provide you with exceptional heart care, our providers are all part of one team.  This team includes your primary Cardiologist (physician) and Advanced Practice Providers or APPs (Physician Assistants and Nurse Practitioners) who all work together to provide you with the care you need, when you need it.  Your next appointment:   6 MONTHS  Provider:   Antoinette Batman, MD

## 2023-11-18 NOTE — Progress Notes (Signed)
 Cardiology Office Note:    Date:  11/18/2023  ID:  [name] Sosa, DOB [birth year], MRN [id number] PCP: Marquetta Sit, MD  [name] Providers Cardiologist:  Antoinette Batman, MD       Patient Profile:       Chief Complaint: Preoperative cardiovascular risk assessment History of Present Illness:  [name] Sosa is a 68 y.o. male with visit-pertinent history of coronary artery disease s/p 7V CABG in 2000 at [facility], hypertension, hyperlipidemia, carotid artery disease, prior TIA  He was seen as a new patient in January 2011 with complaints of chest pain.  Cardiac catheterization 2011 with 5/7 patent bypass grafts.  Stress Myoview  in August 2015 with no ischemia, LVEF 48%.  Cardiac catheterization October 2017 with patent LIMA to LAD, patent SVG to OM which filled all of the circumflex and occluded RCA with occluded SVG to RCA.  The RCA fills from left-to-right collaterals.  Nuclear stress test June 2020 with evidence of prior infarct overall low risk.  Echocardiogram June 2020 with LVEF 45-50% with segmental wall motion abnormality.  He was seen in the office in July 2022 and reported worsening chest pains.  Cardiac catheterization 01/19/2021 with 2/5 patent grafts (LIMA to LAD, free RIMA to circumflex).  The RCA is occluded and fills from left-to-right collaterals, LVEF 40 to 45%.  He underwent left carotid endarterectomy in May 2018 following a TIA.  His carotid disease is followed by vascular surgery at [facility].  Carotid Dopplers 10/2020 with 40-59% R ICA stenosis and 1-39% LICA stenosis.  Echocardiogram May 2023 with LVEF 55 to 60% with akinesis of the basal inferior wall, mild mitral regurgitation.  Cardiac monitor June 2023 was sinus, PVCs.  Seen for chest pain in January 2024 and Ranexa  increased to 1000 mg twice daily.  He had 3 syncopal events in April 2024 while urinating and was felt to have micturition syncope.  He is now sitting down to urinate and has not had any further syncope.   He was seen for chest pain in September 2024 by Dr. Amanda Jungling and carvedilol  was increased.  He was last seen in office on 05/15/2023 by Dr. Abel Hoe.  He had no change in his chronic occasional chest pains.  No medication changes were made.  He was to follow-up in 1 year.  He is now scheduled for upcoming endoscopy/colonoscopy on 11/25/2023 with [facility]ology.   Discussed the use of AI scribe software for clinical note transcription with the patient, who gave verbal consent to proceed.  History of Present Illness [name] Sosa is a 68 year old male with coronary artery disease who presents for cardiac clearance for an upcoming procedure.  Today patient is doing well overall.  He is without any acute cardiovascular concerns or complaints.  Does have history of chronic stable angina.  Denies any recent exertional chest pains.  No change in frequency or duration of chronic chest pains for at least the past year.  Denies prior anginal equivalent.  No dyspnea, orthopnea, PND, leg swelling.  Has not had to use as needed nitroglycerin .  He maintains an active lifestyle, walking three to four times a week for about three miles. He experiences occasional shortness of breath when walking very fast which is not unusual for him.   He has no recent weight gain, fluid buildup, or syncope episodes. He performs daily activities independently, including walking up stairs and doing light housework.  He is easily able to complete greater than 4 METS without exertional symptoms.  Review  of systems:  Please see the history of present illness. All other systems are reviewed and otherwise negative.      Studies Reviewed:        ZIO 11/27/2021 Patch Wear Time:  3 days and 5 hours (2023-05-31T16:08:30-0400 to 2023-06-03T21:28:24-0400)    Sinus with minimum heart rate of 48 beats per minute.  Rare premature ventricular contractions.    Echocardiogram 11/02/2021 1. Akinesis of the basal inferior wall  with overall normal LV function.   2. Left ventricular ejection fraction, by estimation, is 55 to 60%. The  left ventricle has normal function. The left ventricle demonstrates  regional wall motion abnormalities (see scoring diagram/findings for  description). Left ventricular diastolic  parameters are consistent with Grade I diastolic dysfunction (impaired  relaxation).   3. Right ventricular systolic function is normal. The right ventricular  size is normal.   4. Left atrial size was mildly dilated.   5. The mitral valve is normal in structure. Mild mitral valve  regurgitation. No evidence of mitral stenosis. There is mild late systolic  prolapse of the medial segment of the anterior leaflet of the mitral  valve.   6. The aortic valve is tricuspid. Aortic valve regurgitation is not  visualized. Aortic valve sclerosis is present, with no evidence of aortic  valve stenosis.   7. Aortic dilatation noted. There is borderline dilatation of the aortic  root, measuring 38 mm.   8. The inferior vena cava is normal in size with greater than 50%  respiratory variability, suggesting right atrial pressure of 3 mmHg.   LHC 01/19/2021 1. Triple vessel CAD s/p 5V CABG with 2/5 patent bypass grafts 2. LAD is occluded mid segment. LIMA graft is patent to the LAD 3. Circumflex is occluded proximally. The free RIMA graft to the first OM is patent and fills the entire Circumflex including all marginal branches. 4. The RCA is chronically occluded proximally. The vein graft to the RCA is known to be occluded and was not injected.  The distal RCA fills from left to right collaterals. 5. Both SVG grafts to Diagonal 1 and Diagonal 2 are known to be occluded and were not injected 6. Moderate systolic LV dysfunction, LVEF=40-45%.   Recommendations: Continue medical management of CAD Diagnostic Dominance: Right  Risk Assessment/Calculations:              Physical Exam:   VS:  BP (!) 102/50 (BP Location:  Left Arm, Patient Position: Sitting, Cuff Size: Normal)   Pulse 63   Ht 5\' 8"  (1.727 m)   Wt 140 lb (63.5 kg)   BMI 21.29 kg/m    Wt Readings from Last 3 Encounters:  11/18/23 140 lb (63.5 kg)  10/22/23 141 lb (64 kg)  09/10/23 138 lb (62.6 kg)    GEN: Well nourished, well developed in no acute distress NECK: No JVD; No carotid bruits CARDIAC: RRR, no murmurs, rubs, gallops RESPIRATORY:  Clear to auscultation without rales, wheezing or rhonchi  ABDOMEN: Soft, non-tender, non-distended EXTREMITIES:  No edema; No acute deformity      Assessment and Plan:  Coronary artery disease s/p CABG S/p CABG x 7 in 2000 LHC 01/2021 with 2/5 patent grafts, with LIMA graft patent to the LAD, free RIMA graft to first OM is patent and fills entire circumflex including all marginal branches, collateral filling of the occluded RCA with known occluded vein graft, both SVG grafts to diagonal 1 and diagonal 2 are occluded - Today patient is without any exertional symptoms.  Has chronic chest pains and no change over the past year.  No indication for further ischemic evaluation at this time - Continue aspirin  81 mg daily, carvedilol  3.125 mg twice daily, ezetimibe  10 mg daily, Ranexa  100 mg twice daily, nitroglycerin  as needed  Hypertension Blood pressure today is 102/50 and well-controlled - Maintain home BP monitoring and log - Continue current antihypertensive regimen  Hyperlipidemia LDL 21, TG 93, TC 89 on 12/2022 LDL under excellent control and under goal less than 55 - Continue ezetimibe  10 mg daily and Repatha  - Repeat lipid panel ordered today  Carotid artery disease Hx of left carotid endarterectomy 2018 at [facility]  Carotid duplex 10/2023 showing right ICA 60-79% stenosis and left ICA 1-39% stenosis - Managed by VVS  HFpEF Echocardiogram 09/26/2022 with LVEF 50% at [facility] Echocardiogram 10/2021 with LVEF 55 to 60% (previously 45-50% in 2020) - Today patient is euvolemic and well compensated on  exam.  NYHA class I.  Maintains active lifestyle without exertional symptoms or limitation.  Does not require daily loop diuretic therapy - Maintain daily weights, heart healthy dieting, sodium restriction  Preoperative cardiovascular clearance According to the Revised Cardiac Risk Index (RCRI), his Perioperative Risk of Major Cardiac Event is (%): 6.6. His Functional Capacity in METs is: 5.07 according to the Duke Activity Status Index (DASI). Therefore, based on ACC/AHA guidelines, patient would be at acceptable risk for the planned procedure without further cardiovascular testing. I will route this recommendation to the requesting party via Epic fax function.       Dispo:  Return in about 6 months (around 05/19/2024).  Signed, Ava Boatman, NP

## 2023-11-22 DIAGNOSIS — I200 Unstable angina: Secondary | ICD-10-CM | POA: Diagnosis not present

## 2023-11-22 DIAGNOSIS — E7800 Pure hypercholesterolemia, unspecified: Secondary | ICD-10-CM | POA: Diagnosis not present

## 2023-11-22 DIAGNOSIS — I10 Essential (primary) hypertension: Secondary | ICD-10-CM | POA: Diagnosis not present

## 2023-11-22 DIAGNOSIS — I25118 Atherosclerotic heart disease of native coronary artery with other forms of angina pectoris: Secondary | ICD-10-CM | POA: Diagnosis not present

## 2023-11-22 DIAGNOSIS — I639 Cerebral infarction, unspecified: Secondary | ICD-10-CM | POA: Diagnosis not present

## 2023-11-22 LAB — COMPREHENSIVE METABOLIC PANEL WITH GFR
ALT: 27 IU/L (ref 0–44)
AST: 26 IU/L (ref 0–40)
Albumin: 4.3 g/dL (ref 3.9–4.9)
Alkaline Phosphatase: 53 IU/L (ref 44–121)
BUN/Creatinine Ratio: 16 (ref 10–24)
BUN: 17 mg/dL (ref 8–27)
Bilirubin Total: 0.5 mg/dL (ref 0.0–1.2)
CO2: 24 mmol/L (ref 20–29)
Calcium: 9.6 mg/dL (ref 8.6–10.2)
Chloride: 99 mmol/L (ref 96–106)
Creatinine, Ser: 1.07 mg/dL (ref 0.76–1.27)
Globulin, Total: 2.3 g/dL (ref 1.5–4.5)
Glucose: 84 mg/dL (ref 70–99)
Potassium: 4.5 mmol/L (ref 3.5–5.2)
Sodium: 138 mmol/L (ref 134–144)
Total Protein: 6.6 g/dL (ref 6.0–8.5)
eGFR: 76 mL/min/{1.73_m2} (ref >59)

## 2023-11-22 LAB — LIPID PANEL
Chol/HDL Ratio: 2.5 ratio (ref 0.0–5.0)
Cholesterol, Total: 141 mg/dL (ref 100–199)
HDL: 57 mg/dL (ref >39)
LDL Chol Calc (NIH): 69 mg/dL (ref 0–99)
Triglycerides: 76 mg/dL (ref 0–149)
VLDL Cholesterol Cal: 15 mg/dL (ref 5–40)

## 2023-11-24 NOTE — Progress Notes (Unsigned)
 [facility] History and Physical   Primary Care Physician:  Marquetta Sit, MD   Reason for Procedure:  Throat discomfort, history of GERD, surveillance of adenomatous colon polyps and family history of colorectal cancer in a first-degree relative  Plan:    Upper endoscopy and colonoscopy     HPI: [name] Sosa is a 68 y.o. male undergoing upper endoscopy and colonoscopy for investigation of throat discomfort, history of GERD, surveillance of adenomatous colon polyps and family history of colorectal cancer in first-degree relative.  Patient reports a 2-1/2-year history of pressure and discomfort in his throat.  It has not been worsening but is persistent.  The discomfort is present throughout the entire day but does not wake him up at night.  He has had intermittent GERD for which he takes pantoprazole .  No dysphagia or odynophagia.  Last colonoscopy was performed at [facility] in 2022.  This disclosed 3 polyps.  Histopathology showed 2 of these polyps were tubular adenomas, 1 polypoid colonic mucosa.  Reports a family history of colorectal cancer in his father at age 75.  Although only 3 years from last colonoscopy, patient is worried about risk of colorectal cancer and wishes to proceed with a sooner colonoscopy.   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    Past Surgical History:  Procedure Laterality Date   4v cabg     CARDIAC CATHETERIZATION     CARDIAC CATHETERIZATION N/A 03/23/2016    Procedure: Left Heart Cath and Cors/Grafts Angiography;  Surgeon: Odie Benne, MD;  Location: [facility] INVASIVE CV LAB;  Service: Cardiovascular;  Laterality: N/A;   CAROTID ANGIOGRAPHY N/A 10/11/2016   Procedure: Carotid Angiography / Cerebral angiogram;  Surgeon: Arvil Lauber, MD;  Location: [facility] INVASIVE CV LAB;  Service: Cardiovascular;  Laterality: N/A;   CAROTID ENDARTERECTOMY     COLONOSCOPY  2017   COLONOSCOPY W/ POLYPECTOMY     CORONARY ARTERY BYPASS GRAFT     ENDARTERECTOMY Left 10/16/2016   Procedure: LEFT CAROTID ENDARTERECTOMY WITH PATCH ANGIOPLASTY;  Surgeon: Arvil Lauber, MD;  Location: [facility] OR;  Service: Vascular;  Laterality: Left;   LEFT HEART CATH AND CORS/GRAFTS ANGIOGRAPHY N/A 01/19/2021   Procedure: LEFT HEART CATH AND CORS/GRAFTS ANGIOGRAPHY;  Surgeon: Odie Benne, MD;  Location: [facility] INVASIVE CV LAB;  Service: Cardiovascular;  Laterality: N/A;   patent grafts     s/p 7     UPPER GASTROINTESTINAL ENDOSCOPY      Prior to Admission medications   Medication Sig Start Date End Date Taking? Authorizing Provider  ascorbic acid (VITAMIN C) 500 MG tablet Take 500 mg by mouth daily.    [provider]  aspirin  EC 81 MG tablet Take 1 tablet (81 mg total) by mouth daily. Swallow whole. 11/17/21   Odie Benne, MD  botulinum toxin Type A (BOTOX) 200 units injection Inject 200 Units into the muscle every 3 (three) months. 11/19/22   [provider]  carvedilol  (COREG ) 3.125 MG tablet TAKE 1 TABLET BY MOUTH TWICE DAILY WITH A MEAL 09/30/23   Odie Benne, MD  EMGALITY 120 MG/ML SOAJ Inject 120 mg subcutaneously every 28 (twenty-eight) days 06/06/23   [provider]  ezetimibe  (ZETIA ) 10 MG tablet Take 1 tablet by mouth once daily 09/18/23   Burchette, Marijean Shouts, MD  Multiple Vitamin (MULTIVITAMIN) tablet Take 1 tablet by mouth daily.    [provider]  MYRBETRIQ  25 MG TB24 tablet Take 1 tablet by mouth once daily 09/05/23    Burchette, Marijean Shouts, MD  nitroGLYCERIN  (NITROSTAT ) 0.4 MG SL tablet Place 1 tablet (0.4 mg total) under the tongue every 5 (five) minutes as needed for chest pain (up to 3 doses). 10/15/23   Odie Benne, MD  olmesartan  (BENICAR ) 5 MG tablet Take 2 tablets by mouth once daily 07/01/23   Burchette, Marijean Shouts, MD  Omega-3 1000 MG CAPS Take 1 capsule by mouth daily.    [provider]  pantoprazole  (PROTONIX ) 40 MG tablet Take 40 mg daily as needed 10/17/23   Swinyer, Leilani Punter, NP  ranolazine  (RANEXA ) 1000 MG SR tablet Take 1 tablet by mouth twice daily 09/11/23   Odie Benne, MD  UBRELVY 100 MG TABS Take by mouth. 06/14/23   [provider]  venlafaxine  XR (EFFEXOR -XR) 75 MG 24 hr capsule Take 75 mg by mouth daily. 07/29/22   [provider]  VITAMIN C, CALCIUM  ASCORBATE, PO Take 250 mg by mouth daily.    [provider]    Current Outpatient Medications  Medication Sig Dispense Refill   aspirin  EC 81 MG tablet Take 1 tablet (81 mg total) by mouth daily. Swallow whole. 90 tablet 3   carvedilol  (COREG ) 3.125 MG tablet TAKE 1 TABLET BY MOUTH TWICE DAILY WITH A MEAL 180 tablet 2   Multiple Vitamin (MULTIVITAMIN) tablet Take 1 tablet by mouth daily.     olmesartan  (BENICAR ) 5 MG tablet Take 2 tablets by mouth once daily 180 tablet 1   ranolazine  (RANEXA ) 1000 MG SR tablet Take 1 tablet by mouth twice daily 180 tablet 2   REPATHA  SURECLICK 140 MG/ML SOAJ SMARTSIG:140 Milligram(s) SUB-Q Every 2 Weeks     venlafaxine  XR (EFFEXOR -XR) 75 MG 24 hr capsule Take 75 mg by mouth daily.     ascorbic acid (VITAMIN C) 500 MG tablet Take 500 mg by mouth daily.     botulinum toxin Type A (BOTOX) 200 units injection Inject 200 Units into the muscle every 3 (three) months.     EMGALITY 120 MG/ML SOAJ Inject 120 mg subcutaneously every 28 (twenty-eight) days     ezetimibe  (ZETIA ) 10 MG tablet Take 1 tablet by mouth once daily 90 tablet 0   MYRBETRIQ  25 MG TB24  tablet Take 1 tablet by mouth once daily 90 tablet 0   nitroGLYCERIN  (NITROSTAT ) 0.4 MG SL tablet Place 1 tablet (0.4 mg total) under the tongue every 5 (five) minutes as needed for chest pain (up to 3 doses). 75 tablet 2   Omega-3 1000 MG CAPS Take 1 capsule by mouth daily.     pantoprazole  (PROTONIX ) 40 MG tablet Take 40 mg daily as needed 30 tablet 2   UBRELVY 100 MG TABS Take by mouth.     VITAMIN C, CALCIUM  ASCORBATE, PO Take 250 mg by mouth daily.     Current Facility-Administered Medications  Medication Dose Route Frequency Provider Last Rate Last Admin   0.9 %  sodium chloride  infusion  500 mL Intravenous Once Truddie Furrow, MD        Allergies as of 11/25/2023 - Review Complete 11/25/2023  Allergen Reaction Noted   Amlodipine  Itching 12/01/2018   Lisinopril  Cough 01/24/2017    Family History  Problem Relation Age of Onset   Heart failure Mother    Hypertension Mother    Heart disease Mother    Colon cancer Father    Heart failure Father    Hypertension Father    Heart disease Father        before age 53   Hypertension Sister    Hypertension Brother    Heart attack Neg Hx    Stroke Neg Hx    Esophageal cancer Neg Hx    Rectal cancer Neg Hx    Stomach cancer Neg Hx     Social History   Socioeconomic History   Marital status: Single    Spouse name: Not on file   Number of children: 3   Years of education: AS + 2   Highest education level: Associate degree: occupational, Scientist, product/process development, or vocational program  Occupational History   Occupation: Statistician   Occupation: Curator  Tobacco Use   Smoking status: Former    Current packs/day: 0.00    Types: Cigarettes    Start date: 06/19/1996    Quit date: 06/19/1998    Years since quitting: 25.4   Smokeless tobacco: Never  Vaping Use   Vaping status: Never Used  Substance and Sexual Activity   Alcohol use: No   Drug use: No   Sexual activity: Yes  Other Topics Concern   Not on file  Social History Narrative    Lives at home alone   Right-handed   Caffeine: 2 cups per day   Social Drivers of Health   Financial Resource Strain: Low Risk  (07/12/2023)   Overall Financial Resource Strain (CARDIA)    Difficulty of Paying Living Expenses: Not hard at all  Food Insecurity: No Food Insecurity (07/12/2023)   Hunger Vital Sign    Worried About Running Out of Food in the Last Year: Never true    Ran Out of Food in the Last Year: Never true  Transportation Needs: No Transportation Needs (07/12/2023)   PRAPARE - Administrator, Civil Service (Medical): No    Lack of Transportation (Non-Medical): No  Physical Activity: Insufficiently Active (07/12/2023)   Exercise Vital Sign    Days of Exercise per Week: 3 days    Minutes of Exercise per Session: 40 min  Stress: No Stress Concern Present (07/12/2023)   [employer] of Occupational Health - Occupational Stress Questionnaire    Feeling of Stress : Not at all  Social Connections: Unknown (07/12/2023)   Social Connection and Isolation Panel [NHANES]    Frequency of Communication with Friends and Family: More than three times a week    Frequency of Social Gatherings with Friends and Family: More than three times a week    Attends Religious Services: Never    Database administrator or Organizations: No    Attends Engineer, structural: Not on file    Marital Status: Not on file  Intimate Partner Violence: Unknown (09/21/2021)   Received from [employer], [facility]   HITS    Physically Hurt: Not on file    Insult or Talk Down To: Not on file    Threaten Physical Harm: Not on file    Scream or Curse:  Not on file    Review of Systems:  All other review of systems negative except as mentioned in the HPI.  Physical Exam: Vital signs BP 117/66   Pulse 62   Temp 98 F (36.7 C) (Temporal)   Ht 5\' 8"  (1.727 m)   Wt 141 lb (64 kg)   SpO2 100%   BMI 21.44 kg/m   General:   Alert,  Well-developed, well-nourished, pleasant and  cooperative in NAD Airway:  Mallampati 2 Lungs:  Clear throughout to auscultation.   Heart:  Regular rate and rhythm; no murmurs, clicks, rubs,  or gallops. Abdomen:  Soft, nontender and nondistended. Normal bowel sounds.   Neuro/Psych:  Normal mood and affect. A and O x 3  Eugenia Hess, MD [facility]

## 2023-11-25 ENCOUNTER — Ambulatory Visit: Payer: Self-pay | Admitting: Emergency Medicine

## 2023-11-25 ENCOUNTER — Encounter: Payer: Self-pay | Admitting: Pediatrics

## 2023-11-25 ENCOUNTER — Ambulatory Visit: Admitting: Pediatrics

## 2023-11-25 VITALS — BP 120/75 | HR 62 | Temp 98.0°F | Resp 16 | Ht 68.0 in | Wt 141.0 lb

## 2023-11-25 DIAGNOSIS — K2270 Barrett's esophagus without dysplasia: Secondary | ICD-10-CM

## 2023-11-25 DIAGNOSIS — K648 Other hemorrhoids: Secondary | ICD-10-CM

## 2023-11-25 DIAGNOSIS — E785 Hyperlipidemia, unspecified: Secondary | ICD-10-CM | POA: Diagnosis not present

## 2023-11-25 DIAGNOSIS — Z1211 Encounter for screening for malignant neoplasm of colon: Secondary | ICD-10-CM

## 2023-11-25 DIAGNOSIS — D124 Benign neoplasm of descending colon: Secondary | ICD-10-CM

## 2023-11-25 DIAGNOSIS — K2970 Gastritis, unspecified, without bleeding: Secondary | ICD-10-CM

## 2023-11-25 DIAGNOSIS — K219 Gastro-esophageal reflux disease without esophagitis: Secondary | ICD-10-CM

## 2023-11-25 DIAGNOSIS — Z860100 Personal history of colon polyps, unspecified: Secondary | ICD-10-CM

## 2023-11-25 DIAGNOSIS — Z860101 Personal history of adenomatous and serrated colon polyps: Secondary | ICD-10-CM

## 2023-11-25 DIAGNOSIS — R070 Pain in throat: Secondary | ICD-10-CM | POA: Diagnosis not present

## 2023-11-25 DIAGNOSIS — Z800 Family history of malignant neoplasm of digestive organs: Secondary | ICD-10-CM | POA: Diagnosis not present

## 2023-11-25 DIAGNOSIS — I10 Essential (primary) hypertension: Secondary | ICD-10-CM | POA: Diagnosis not present

## 2023-11-25 MED ORDER — SODIUM CHLORIDE 0.9 % IV SOLN: 500.0000 mL | Freq: Once | INTRAVENOUS | Status: DC

## 2023-11-25 NOTE — Op Note (Signed)
 [facility] Patient Name: Decorian Schuenemann Procedure Date: 11/25/2023 3:20 PM MRN: [id number] Endoscopist: Eugenia Hess , MD, [id number] Age: 68 Referring MD:  Date of Birth: [birth year] Gender: Male Account #: [id number] Procedure:                Upper GI endoscopy Indications:              Follow-up of gastro-esophageal reflux disease,                            Throat pain Medicines:                Monitored Anesthesia Care Procedure:                Pre-Anesthesia Assessment:                           - Prior to the procedure, a History and Physical                            was performed, and patient medications and                            allergies were reviewed. The patient's tolerance of                            previous anesthesia was also reviewed. The risks                            and benefits of the procedure and the sedation                            options and risks were discussed with the patient.                            All questions were answered, and informed consent                            was obtained. Prior Anticoagulants: The patient has                            taken no anticoagulant or antiplatelet agents                            except for aspirin . ASA Grade Assessment: III - A                            patient with severe systemic disease. After                            reviewing the risks and benefits, the patient was                            deemed in satisfactory condition to undergo the  procedure.                           After obtaining informed consent, the endoscope was                            passed under direct vision. Throughout the                            procedure, the patient's blood pressure, pulse, and                            oxygen saturations were monitored continuously. The                            Olympus Scope SN [id number] was introduced through the                             mouth, and advanced to the second part of duodenum.                            The upper GI endoscopy was accomplished without                            difficulty. The patient tolerated the procedure                            well. Scope In: 3:37:03 PM Scope Out: 3:46:39 PM Total Procedure Duration: 0 hours 9 minutes 36 seconds  Findings:                 The upper third of the esophagus and middle third                            of the esophagus were normal.                           One tongue of salmon-colored mucosa was present and                            islands of salmon-colored mucosa were present. The                            maximum longitudinal extent of these esophageal                            mucosal changes was 1 cm in length. Biopsies were                            taken with a cold forceps for histology.                           Biopsies were obtained from the proximal and distal  esophagus with cold forceps for histology to rule                            out eosinophilic esophagitis.                           Localized mild inflammation characterized by                            congestion (edema), erosions and erythema was found                            in the prepyloric region of the stomach. Biopsies                            were taken with a cold forceps for Helicobacter                            pylori testing.                           The gastric body, gastric antrum, cardia (on                            retroflexion) and gastric fundus (on retroflexion)                            were normal. Biopsies were taken with a cold                            forceps for Helicobacter pylori testing.                           The duodenal bulb and second portion of the                            duodenum were normal. Complications:            No immediate complications. Estimated Blood Loss:     Estimated blood loss was  minimal. Impression:               - Normal upper third of esophagus and middle third                            of esophagus.                           - Salmon-colored mucosa suspicious for                            short-segment Barrett's esophagus. Biopsied.                           - Gastritis. Biopsied.                           - Normal gastric  body, antrum, cardia and gastric                            fundus. Biopsied.                           - Normal duodenal bulb and second portion of the                            duodenum.                           - Biopsies were taken with a cold forceps for                            evaluation of eosinophilic esophagitis. Recommendation:           - Await pathology results.                           - Perform a colonoscopy today.                           - The findings and recommendations were discussed                            with the patient. Eugenia Hess, MD 11/25/2023 4:20:04 PM This report has been signed electronically.

## 2023-11-25 NOTE — Patient Instructions (Signed)

## 2023-11-25 NOTE — Op Note (Signed)
 [facility] Patient Name: [name] Sosa Procedure Date: 11/25/2023 3:21 PM MRN: [id number] Endoscopist: Eugenia Hess , MD, [id number] Age: 68 Referring MD:  Date of Birth: [birth year] Gender: Male Account #: [id number] Procedure:                Colonoscopy Indications:              High risk colon cancer surveillance: Personal                            history of non-advanced adenoma, Last colonoscopy:                            2022, Family history of colon cancer in a                            first-degree relative Medicines:                Monitored Anesthesia Care Procedure:                Pre-Anesthesia Assessment:                           - Prior to the procedure, a History and Physical                            was performed, and patient medications and                            allergies were reviewed. The patient's tolerance of                            previous anesthesia was also reviewed. The risks                            and benefits of the procedure and the sedation                            options and risks were discussed with the patient.                            All questions were answered, and informed consent                            was obtained. Prior Anticoagulants: The patient has                            taken no anticoagulant or antiplatelet agents                            except for aspirin . ASA Grade Assessment: III - A                            patient with severe systemic disease. After  reviewing the risks and benefits, the patient was                            deemed in satisfactory condition to undergo the                            procedure.                           After obtaining informed consent, the colonoscope                            was passed under direct vision. Throughout the                            procedure, the patient's blood pressure, pulse, and                             oxygen saturations were monitored continuously. The                            Olympus CF-HQ190L ([id number]) Colonoscope was                            introduced through the anus and advanced to the                            cecum, identified by appendiceal orifice and                            ileocecal valve. The colonoscopy was somewhat                            difficult due to restricted mobility of the colon.                            Successful completion of the procedure was aided by                            withdrawing the scope and replacing with the                            pediatric colonoscope. The patient tolerated the                            procedure well. The quality of the bowel                            preparation was good. The ileocecal valve,                            appendiceal orifice, and rectum were photographed. Scope In: 3:50:29 PM Scope Out: 4:16:37 PM Scope Withdrawal Time: 0 hours 11 minutes 48 seconds  Total Procedure Duration: 0 hours 26 minutes 8  seconds  Findings:                 The perianal and digital rectal examinations were                            normal. Pertinent negatives include normal                            sphincter tone and no palpable rectal lesions.                           A 4 mm polyp was found in the descending colon. The                            polyp was sessile. The polyp was removed with a                            cold biopsy forceps. Resection and retrieval were                            complete.                           Internal hemorrhoids were found during retroflexion. Complications:            No immediate complications. Estimated blood loss:                            Minimal. Estimated Blood Loss:     Estimated blood loss was minimal. Impression:               - One 4 mm polyp in the descending colon, removed                            with a cold biopsy forceps. Resected and retrieved.                            - Internal hemorrhoids. Recommendation:           - Discharge patient to home (ambulatory).                           - Await pathology results.                           - Repeat colonoscopy in 5 years for surveillance.                           - The findings and recommendations were discussed                            with the patient.                           - Patient has a contact number available for  emergencies. The signs and symptoms of potential                            delayed complications were discussed with the                            patient. Return to normal activities tomorrow.                            Written discharge instructions were provided to the                            patient. Eugenia Hess, MD 11/25/2023 4:24:00 PM This report has been signed electronically.

## 2023-11-25 NOTE — Progress Notes (Signed)
 Pt's states no medical or surgical changes since previsit or office visit.

## 2023-11-25 NOTE — Progress Notes (Signed)
 Report to PACU, RN, vss, BBS= Clear.

## 2023-11-26 ENCOUNTER — Telehealth: Payer: Self-pay

## 2023-11-26 NOTE — Telephone Encounter (Signed)
 Left message on follow up call.

## 2023-11-28 LAB — SURGICAL PATHOLOGY

## 2023-11-30 ENCOUNTER — Ambulatory Visit: Payer: Self-pay | Admitting: Pediatrics

## 2023-12-01 ENCOUNTER — Other Ambulatory Visit: Payer: Self-pay | Admitting: Family Medicine

## 2023-12-02 ENCOUNTER — Encounter: Payer: Self-pay | Admitting: Family Medicine

## 2023-12-02 NOTE — Telephone Encounter (Signed)
 Rx sent.

## 2023-12-02 NOTE — Telephone Encounter (Signed)
 Copied from CRM [id number]. Topic: Clinical - Medication Refill >> Dec 02, 2023  1:26 PM Emmet Harm C wrote: Medication: REPATHA  SURECLICK 140 MG/ML SOA  Has the patient contacted their pharmacy? Yes (Agent: If no, request that the patient contact the pharmacy for the refill. If patient does not wish to contact the pharmacy document the reason why and proceed with request.) (Agent: If yes, when and what did the pharmacy advise?)  This is the patient's preferred pharmacy:  [facility] [address] [place] - [address] [place] [address] Phone: [phone] Fax: [phone]  Is this the correct pharmacy for this prescription? Yes If no, delete pharmacy and type the correct one.   Has the prescription been filled recently? No  Is the patient out of the medication? Yes  Has the patient been seen for an appointment in the last year OR does the patient have an upcoming appointment? Yes  Can we respond through MyChart? No  Agent: Please be advised that Rx refills may take up to 3 business days. We ask that you follow-up with your pharmacy.

## 2023-12-03 ENCOUNTER — Encounter: Payer: Self-pay | Admitting: Vascular Surgery

## 2023-12-03 ENCOUNTER — Ambulatory Visit: Attending: Vascular Surgery | Admitting: Vascular Surgery

## 2023-12-03 ENCOUNTER — Other Ambulatory Visit: Payer: Self-pay | Admitting: Family Medicine

## 2023-12-03 VITALS — BP 94/57 | HR 63 | Ht 68.0 in | Wt 138.0 lb

## 2023-12-03 DIAGNOSIS — I6523 Occlusion and stenosis of bilateral carotid arteries: Secondary | ICD-10-CM

## 2023-12-03 MED ORDER — REPATHA SURECLICK 140 MG/ML ~~LOC~~ SOAJ: SUBCUTANEOUS | 1 refills | Status: DC

## 2023-12-03 NOTE — Progress Notes (Signed)
 [facility] [place]  ASSESSMENT / PLAN: [name] Sosa is a 68 y.o. male with asymptomatic right 65% carotid artery stenosis. 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   The patient should continue best medical therapy for carotid artery stenosis including: Complete cessation from all tobacco products. Blood glucose control with goal A1c < 7%. Blood pressure control with goal blood pressure < 140/90 mmHg. Lipid reduction therapy with goal LDL-C <100 mg/dL (<14 if symptomatic from carotid artery stenosis).  Aspirin  81mg  PO QD.  Atorvastatin 40-80mg  PO QD (or other high intensity statin therapy).  Follow-up with annual carotid duplex ultrasound.  AAA duplex does not need to be repeated until 3 years from now (2028).  CHIEF COMPLAINT: Recurrent syncope  HISTORY OF PRESENT ILLNESS: [name] Sosa is a 68 y.o. male who presents to clinic for evaluation of carotid artery stenosis.  The patient reports he has had multiple episodes of syncope over the past several months.  Exhaustive workup has been performed with no cause found to date.  Carotid artery stenosis and right vertebral artery stenosis was demonstrated on recent CT angiogram of his head and neck.  He presents today to discuss these findings as a possible cause for his symptoms.  We reviewed the natural history of carotid artery stenosis.  He specifically denies any typical stroke or mini stroke symptoms attributable to a carotid him.  He specifically denies amaurosis, unilateral weakness or numbness, facial drooping, difficulty speaking.  He has been told that his syncope is vasovagal in origin.  09/10/2023: Patient presents to clinic for evaluation of possible peripheral arterial disease.  The patient has no lower extremity symptoms except some prominent reticular veins which are bothersome to him.  He can walk as fast as far as he likes.  He has no symptoms of rest pain.  He has no  ulcers about his feet.  We reviewed his noninvasive testing in detail.  12/03/2023.  Patient returns for duplex ultrasound of carotid and aorta.  I reviewed these in detail with him.  No stroke or TIA since I last saw him.  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    Past Surgical History:  Procedure Laterality Date   4v cabg     CARDIAC CATHETERIZATION     CARDIAC CATHETERIZATION N/A 03/23/2016   Procedure: Left Heart Cath and Cors/Grafts Angiography;  Surgeon: Odie Benne, MD;  Location: [facility] INVASIVE CV LAB;  Service: Cardiovascular;  Laterality: N/A;   CAROTID ANGIOGRAPHY N/A 10/11/2016   Procedure: Carotid Angiography / Cerebral angiogram;  Surgeon: Arvil Lauber, MD;  Location: [facility] INVASIVE CV LAB;  Service: Cardiovascular;  Laterality: N/A;   CAROTID ENDARTERECTOMY     COLONOSCOPY  2017   COLONOSCOPY W/ POLYPECTOMY     CORONARY ARTERY BYPASS GRAFT     ENDARTERECTOMY Left 10/16/2016   Procedure: LEFT CAROTID ENDARTERECTOMY WITH PATCH ANGIOPLASTY;  Surgeon: Arvil Lauber, MD;  Location: [facility] OR;  Service: Vascular;  Laterality: Left;   LEFT HEART CATH AND CORS/GRAFTS ANGIOGRAPHY N/A 01/19/2021   Procedure: LEFT HEART CATH AND CORS/GRAFTS ANGIOGRAPHY;  Surgeon: Odie Benne, MD;  Location: [facility] INVASIVE CV LAB;  Service: Cardiovascular;  Laterality: N/A;   patent grafts     s/p 7     UPPER GASTROINTESTINAL ENDOSCOPY      Family History  Problem Relation Age of Onset   Heart failure Mother     Hypertension Mother    Heart disease Mother    Colon cancer Father    Heart failure Father    Hypertension Father    Heart disease Father        before age 22   Hypertension Sister    Hypertension Brother    Heart attack Neg Hx    Stroke Neg Hx    Esophageal cancer Neg Hx    Rectal cancer Neg Hx    Stomach cancer Neg Hx     Social History   Socioeconomic History   Marital status: Single    Spouse name: Not on file   Number of children: 3   Years of education: AS + 2   Highest education level: Associate degree: occupational, Scientist, product/process development, or vocational program  Occupational History   Occupation: Statistician   Occupation: Curator  Tobacco Use   Smoking status: Former    Current packs/day: 0.00    Types: Cigarettes    Start date: 06/19/1996    Quit date: 06/19/1998    Years since quitting: 25.4   Smokeless tobacco: Never  Vaping Use   Vaping status: Never Used  Substance and Sexual Activity   Alcohol use: No   Drug use: No   Sexual activity: Yes  Other Topics Concern   Not on file  Social History Narrative   Lives at home alone   Right-handed   Caffeine: 2 cups per day   Social Drivers of Health   Financial Resource Strain: Low Risk  (07/12/2023)   Overall Financial Resource Strain (CARDIA)    Difficulty of Paying Living Expenses: Not hard at all  Food Insecurity: No Food Insecurity (07/12/2023)   Hunger Vital Sign    Worried About Running Out of Food in the Last Year: Never true    Ran Out of Food in the Last Year: Never true  Transportation Needs: No Transportation Needs (07/12/2023)   PRAPARE - Administrator, Civil Service (Medical): No    Lack of Transportation (Non-Medical): No  Physical Activity: Insufficiently Active (07/12/2023)   Exercise Vital Sign    Days of Exercise per Week: 3 days    Minutes of Exercise per Session: 40 min  Stress: No Stress Concern Present (07/12/2023)   [employer] of Occupational Health - Occupational Stress  Questionnaire    Feeling of Stress : Not at all  Social Connections: Unknown (07/12/2023)   Social Connection and Isolation Panel    Frequency of Communication with Friends and Family: More than three times a week    Frequency of Social Gatherings with Friends and Family: More than three times a week    Attends Religious Services: Never    Database administrator or Organizations: No    Attends Banker Meetings: Not on file    Marital Status: Not on file  Intimate Partner Violence: Unknown (09/21/2021)   Received from [facility]   HITS    Physically Hurt: Not on file    Insult  or Talk Down To: Not on file    Threaten Physical Harm: Not on file    Scream or Curse: Not on file    Allergies  Allergen Reactions   Amlodipine  Itching    Chest pain  Chest pain     Chest pain  Chest pain   Chest pain   Lisinopril  Cough    Other reaction(s): Cough    Current Outpatient Medications  Medication Sig Dispense Refill   ascorbic acid (VITAMIN C) 500 MG tablet Take 500 mg by mouth daily.     aspirin  EC 81 MG tablet Take 1 tablet (81 mg total) by mouth daily. Swallow whole. 90 tablet 3   botulinum toxin Type A (BOTOX) 200 units injection Inject 200 Units into the muscle every 3 (three) months.     carvedilol  (COREG ) 3.125 MG tablet TAKE 1 TABLET BY MOUTH TWICE DAILY WITH A MEAL 180 tablet 2   EMGALITY 120 MG/ML SOAJ Inject 120 mg subcutaneously every 28 (twenty-eight) days     ezetimibe  (ZETIA ) 10 MG tablet Take 1 tablet by mouth once daily 90 tablet 0   Multiple Vitamin (MULTIVITAMIN) tablet Take 1 tablet by mouth daily.     MYRBETRIQ  25 MG TB24 tablet Take 1 tablet by mouth once daily 90 tablet 0   nitroGLYCERIN  (NITROSTAT ) 0.4 MG SL tablet Place 1 tablet (0.4 mg total) under the tongue every 5 (five) minutes as needed for chest pain (up to 3 doses). 75 tablet 2   olmesartan  (BENICAR ) 5 MG tablet Take 2 tablets by mouth once daily 180 tablet 1   Omega-3 1000 MG CAPS Take 1  capsule by mouth daily.     pantoprazole  (PROTONIX ) 40 MG tablet Take 40 mg daily as needed 30 tablet 2   ranolazine  (RANEXA ) 1000 MG SR tablet Take 1 tablet by mouth twice daily 180 tablet 2   REPATHA  SURECLICK 140 MG/ML SOAJ SMARTSIG:140 Milligram(s) SUB-Q Every 2 Weeks 6 mL 1   UBRELVY 100 MG TABS Take by mouth.     venlafaxine  XR (EFFEXOR -XR) 75 MG 24 hr capsule Take 75 mg by mouth daily.     VITAMIN C, CALCIUM  ASCORBATE, PO Take 250 mg by mouth daily.     No current facility-administered medications for this visit.    PHYSICAL EXAM Vitals:   12/03/23 1508  BP: (!) 94/57  Pulse: 63  SpO2: 96%  Weight: 138 lb (62.6 kg)  Height: 5' 8 (1.727 m)     Middle-age man in no acute distress Well-healed left neck incision Palpable radial pulses bilaterally Regular rate and rhythm Unlabored breathing 2+ posterior tibial pulses bilaterally  PERTINENT LABORATORY AND RADIOLOGIC DATA  Most recent CBC    Latest Ref Rng & Units 10/10/2021   11:05 AM 01/12/2021   10:33 AM 11/28/2018    1:15 PM  CBC  WBC 3.4 - 10.8 x10E3/uL 5.8  6.6  5.9   Hemoglobin 13.0 - 17.7 g/dL 16.1  09.6  04.5   Hematocrit 37.5 - 51.0 % 43.3  42.2  42.6   Platelets 150 - 450 x10E3/uL 246  266  263      Most recent CMP    Latest Ref Rng & Units 11/22/2023    8:45 AM 07/30/2023   10:24 AM 04/03/2023    4:07 PM  CMP  Glucose 70 - 99 mg/dL 84  98  409   BUN 8 - 27 mg/dL 17  19  21    Creatinine 0.76 - 1.27 mg/dL 8.11  0.86  0.96   Sodium 134 - 144 mmol/L 138  136  135   Potassium 3.5 - 5.2 mmol/L 4.5  4.1  4.8   Chloride 96 - 106 mmol/L 99  100  99   CO2 20 - 29 mmol/L 24  30  29    Calcium  8.6 - 10.2 mg/dL 9.6  9.2  9.4   Total Protein 6.0 - 8.5 g/dL 6.6     Total Bilirubin 0.0 - 1.2 mg/dL 0.5     Alkaline Phos 44 - 121 IU/L 53     AST 0 - 40 IU/L 26     ALT 0 - 44 IU/L 27       Renal function Estimated Creatinine Clearance: 59.3 mL/min (by C-G formula based on SCr of 1.07 mg/dL).  Hgb A1c MFr Bld  (%)  Date Value  10/10/2021 5.8 (H)    LDL Cholesterol (Calc)  Date Value Ref Range Status  01/04/2023 21 mg/dL (calc) Final    Comment:    Reference range: <100 . Desirable range <100 mg/dL for primary prevention;   <70 mg/dL for patients with CHD or diabetic patients  with > or = 2 CHD risk factors. Aaron Aas LDL-C is now calculated using the Martin-Hopkins  calculation, which is a validated novel method providing  better accuracy than the Friedewald equation in the  estimation of LDL-C.  Melinda Sprawls et al. Erroll Heard. 3474;259(56): 2061-2068  (http://education.[facility].com/faq/FAQ164)    LDL Chol Calc (NIH)  Date Value Ref Range Status  11/22/2023 69 0 - 99 mg/dL Final     Vascular Imaging:  Right Carotid: Velocities in the right ICA are consistent with a 60-79%                 stenosis.                   Non-hemodynamically significant plaque <50% noted in the  CCA.                The ECA appears >50% stenosed.   Left Carotid: Velocities in the left ICA are consistent with a stable  1-39%               stenosis, s/p CEA.                Non-hemodynamically significant plaque <50% noted in the  CCA.               The ECA appears <50% stenosed.   Vertebrals:  Bilateral vertebral arteries demonstrate antegrade flow.  Subclavians: Normal flow hemodynamics were seen in bilateral subclavian               arteries.   AAA duplex  - Focal ectasia of the abdominal aorta (distal) measuring a maximum  diameter of 2.75 cm (Prior 2.8 cm - CTA)  - Bilateral iliac arteries remain patent with biphasic waveforms.       Heber Little. Edgardo Goodwill, MD FACS [facility] Phone Number: [phone] 12/03/2023 4:21 PM   Total time spent on preparing this encounter including chart review, data review, collecting history, examining the patient, coordinating care for this established patient, 20 minutes  Portions of this report may have been transcribed  using voice recognition software.  Every effort has been made to ensure accuracy; however, inadvertent computerized transcription errors may still be present.

## 2023-12-04 DIAGNOSIS — G43709 Chronic migraine without aura, not intractable, without status migrainosus: Secondary | ICD-10-CM | POA: Diagnosis not present

## 2023-12-10 ENCOUNTER — Encounter: Payer: Self-pay | Admitting: Family Medicine

## 2023-12-10 ENCOUNTER — Ambulatory Visit (INDEPENDENT_AMBULATORY_CARE_PROVIDER_SITE_OTHER): Admitting: Family Medicine

## 2023-12-10 VITALS — BP 110/56 | HR 54 | Temp 97.6°F | Wt 139.7 lb

## 2023-12-10 DIAGNOSIS — E7800 Pure hypercholesterolemia, unspecified: Secondary | ICD-10-CM | POA: Diagnosis not present

## 2023-12-10 DIAGNOSIS — R682 Dry mouth, unspecified: Secondary | ICD-10-CM

## 2023-12-10 DIAGNOSIS — K219 Gastro-esophageal reflux disease without esophagitis: Secondary | ICD-10-CM

## 2023-12-10 DIAGNOSIS — I959 Hypotension, unspecified: Secondary | ICD-10-CM | POA: Diagnosis not present

## 2023-12-10 DIAGNOSIS — R399 Unspecified symptoms and signs involving the genitourinary system: Secondary | ICD-10-CM | POA: Diagnosis not present

## 2023-12-10 DIAGNOSIS — I6521 Occlusion and stenosis of right carotid artery: Secondary | ICD-10-CM

## 2023-12-10 MED ORDER — EZETIMIBE 10 MG PO TABS: 10.0000 mg | ORAL_TABLET | Freq: Every day | ORAL | 3 refills | Status: AC

## 2023-12-10 NOTE — Patient Instructions (Signed)
Set up 30 minute follow up. 

## 2023-12-10 NOTE — Progress Notes (Signed)
 Established Patient Office Visit  Subjective   Patient ID: [name] Sosa, male    DOB: [birth year]  Age: 68 y.o. MRN: [id number]  Chief Complaint  Patient presents with   Medical Management of Chronic Issues    HPI   Here today to discuss multiple issues and to review multiple recent studies.  He has history of peripheral vascular disease, CAD with prior bypass, hypertension, history of CVA, history of vestibular migraines, hyperlipidemia.  Saw gastroenterologist recently and had EGD and colonoscopy.  Colonoscopy revealed adenoma.  Recommended 3-year follow-up.  He had some Barrett's changes on EGD biopsy.  Recommend starting back Protonix  40 mg daily.  He denies any active reflux symptoms.  No dysphagia currently.  No evidence for eosinophilic esophagitis.  Recent dry mouth symptoms which occurred during the night.  Usually wakes up with dry mouth.  No history of diabetes.  No current anticholinergic medications.  No longer takes Myrbetriq .  He thinks he may be doing some mouth breathing at night.  No dry symptoms.  Recent lowish blood pressures.  He is had readings as low as 90/55 but no consistent dizziness.  Currently takes only low-dose Benicar  10 mg and carvedilol  3.125 mg twice daily.  No consistent orthostatic symptoms.  He denies any slow urinary stream.  However, sometimes when he urinates within minutes he has to go back and urinate again.  He feels like he may have some postvoid residual.  No recent burning with urination.  Dyslipidemia.  Recent cholesterol 141 with LDL 69.  He takes Repatha .  Is on Zetia  but recently only taking this couple times a week or so.  No history of intolerance with Zetia .  He had multiple vascular studies with vein and vascular surgery including carotid Doppler, abdominal aortic aneurysm duplex, and ABIs.  He does have 60 to 79% stenosis right ICA but no recent TIA symptoms.  Past Medical History:  Diagnosis Date   CAD (coronary artery disease)     s/p 7 vessel CABG 2000 at [facility] // Echo 11/2018: EF 45-50, antero-apical and inferior akinesis // Myoview  11/2018: EF 51, distal ant/apical infarct and small inferior infarct with very mild peri-infarct ischemia; Low Risk   Carotid artery occlusion    High cholesterol    Hyperlipidemia    Hypertension    Pre-diabetes    < 6.1 2017   Stroke (HCC) 04/2016   TIA- 04/2016 double vision 2nd - speech - no residual   Trochlear neuropathy, 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   Past Surgical History:  Procedure Laterality Date   4v cabg     CARDIAC CATHETERIZATION     CARDIAC CATHETERIZATION N/A 03/23/2016   Procedure: Left Heart Cath and Cors/Grafts Angiography;  Surgeon: Lonni JONETTA Cash, MD;  Location: [facility] INVASIVE CV LAB;  Service: Cardiovascular;  Laterality: N/A;   CAROTID ANGIOGRAPHY N/A 10/11/2016   Procedure: Carotid Angiography / Cerebral angiogram;  Surgeon: Redell LITTIE Door, MD;  Location: [facility] INVASIVE CV LAB;  Service: Cardiovascular;  Laterality: N/A;   CAROTID ENDARTERECTOMY     COLONOSCOPY  2017   COLONOSCOPY W/ POLYPECTOMY     CORONARY ARTERY BYPASS GRAFT     ENDARTERECTOMY Left 10/16/2016   Procedure: LEFT CAROTID ENDARTERECTOMY WITH PATCH ANGIOPLASTY;  Surgeon: Redell LITTIE Door, MD;  Location: [facility] OR;  Service: Vascular;  Laterality: Left;   LEFT HEART CATH AND CORS/GRAFTS ANGIOGRAPHY N/A 01/19/2021   Procedure: LEFT HEART CATH AND CORS/GRAFTS ANGIOGRAPHY;  Surgeon: Cash Lonni JONETTA, MD;  Location: [facility] INVASIVE CV LAB;  Service: Cardiovascular;  Laterality: N/A;   patent grafts     s/p 7     UPPER GASTROINTESTINAL ENDOSCOPY      reports that he quit smoking about 25 years ago. His smoking use included cigarettes. He started smoking about 27 years ago. He has never used smokeless tobacco. He reports that he does not drink alcohol and does not use drugs. family history includes Colon cancer in his father; Heart disease in his father and mother; Heart failure in his father and mother; Hypertension in  his brother, father, mother, and sister. Allergies  Allergen Reactions   Amlodipine  Itching    Chest pain  Chest pain     Chest pain  Chest pain   Chest pain   Lisinopril  Cough    Other reaction(s): Cough    Review of Systems  Constitutional:  Negative for chills, fever and malaise/fatigue.  Eyes:  Negative for blurred vision.  Respiratory:  Negative for shortness of breath.   Cardiovascular:  Negative for chest pain.  Gastrointestinal:  Negative for abdominal pain.  Neurological:  Negative for dizziness, weakness and headaches.      Objective:     BP (!) 110/56 (BP Location: Left Arm, Patient Position: Sitting, Cuff Size: Normal)   Pulse (!) 54   Temp 97.6 F (36.4 C) (Oral)   Wt 139 lb 11.2 oz (63.4 kg)   SpO2 97%   BMI 21.24 kg/m  BP Readings from Last 3 Encounters:  12/10/23 (!) 110/56  12/03/23 (!) 94/57  11/25/23 120/75   Wt Readings from Last 3 Encounters:  12/10/23 139 lb 11.2 oz (63.4 kg)  12/03/23 138 lb (62.6 kg)  11/25/23 141 lb (64 kg)      Physical Exam Vitals reviewed.  Constitutional:      Appearance: He is well-developed.  HENT:     Right Ear: External ear normal.     Left Ear: External ear normal.   Eyes:     Pupils: Pupils are equal, round, and reactive to light.   Neck:     Thyroid : No thyromegaly.   Cardiovascular:     Rate and Rhythm: Normal rate and regular rhythm.  Pulmonary:     Effort: Pulmonary effort is normal. No respiratory distress.     Breath sounds: Normal breath sounds. No wheezing or rales.   Musculoskeletal:     Cervical back: Neck supple.   Neurological:     Mental Status: He is alert and oriented to person, place, and time.      No results found for any visits on 12/10/23.  Last CBC Lab Results  Component Value Date   WBC 5.8 10/10/2021   HGB 14.8 10/10/2021   HCT 43.3 10/10/2021   MCV 94 10/10/2021   MCH 32.0 10/10/2021   RDW 11.9 10/10/2021   PLT 246 10/10/2021   Last metabolic panel Lab  Results  Component Value Date   GLUCOSE 84 11/22/2023   NA 138 11/22/2023   K 4.5 11/22/2023   CL 99 11/22/2023   CO2 24 11/22/2023   BUN 17 11/22/2023   CREATININE 1.07 11/22/2023   EGFR 76 11/22/2023   CALCIUM  9.6 11/22/2023   PROT 6.6 11/22/2023   ALBUMIN 4.3 11/22/2023   LABGLOB 2.3 11/22/2023   AGRATIO 2.0 10/10/2021   BILITOT 0.5 11/22/2023   ALKPHOS 53 11/22/2023   AST 26 11/22/2023   ALT 27 11/22/2023   ANIONGAP 7 10/17/2016   Last lipids Lab Results  Component Value Date  CHOL 141 11/22/2023   HDL 57 11/22/2023   LDLCALC 69 11/22/2023   TRIG 76 11/22/2023   CHOLHDL 2.5 11/22/2023   Last hemoglobin A1c Lab Results  Component Value Date   HGBA1C 5.8 (H) 10/10/2021      The ASCVD Risk score (Arnett DK, et al., 2019) failed to calculate for the following reasons:   Risk score cannot be calculated because patient has a medical history suggesting prior/existing ASCVD    Assessment & Plan:   #1 history of GERD.  No active GERD symptoms but did have Barrett's changes on recent EGD.  Continue dietary modification.  Continue Protonix .  Continue close follow-up with GI  #2 dry mouth.  No evidence for dry mouth on exam.  Recent blood sugars normal.  Suspect he is probably mouth breathing at night.  Recommend sips of water during the night and sialagogue such as lemon drops if needed  #3 recent low blood pressure.  Patient on low-dose Benicar  and carvedilol .  Not having consistent orthostatic symptoms.  Stay well-hydrated and monitor  #4 urinary symptoms.  Denies any slow stream but may be having incomplete emptying.  We discussed possible urological referral to check postvoid residuals but he would like to wait at this time  #5 hyperlipidemia.  Recent LDL cholesterol 69.  Goal LDL less than 55.  Not consistently taking Zetia .  Get back to daily use of Zetia  and continue Repatha  and recommend follow-up fasting lipids in about 2 months.  #6 peripheral vascular disease  with carotid stenosis 60 to 79%.  Patient aware of signs and symptoms to watch out for for TIA.  Follow-up promptly with us  or surgeon if he is having any TIA type symptoms Wolm Scarlet, MD

## 2023-12-18 ENCOUNTER — Encounter: Payer: Self-pay | Admitting: Family Medicine

## 2023-12-18 ENCOUNTER — Ambulatory Visit (INDEPENDENT_AMBULATORY_CARE_PROVIDER_SITE_OTHER): Admitting: Family Medicine

## 2023-12-18 ENCOUNTER — Telehealth: Payer: Self-pay

## 2023-12-18 VITALS — BP 120/64 | HR 72 | Wt 139.3 lb

## 2023-12-18 DIAGNOSIS — R3915 Urgency of urination: Secondary | ICD-10-CM | POA: Diagnosis not present

## 2023-12-18 DIAGNOSIS — R300 Dysuria: Secondary | ICD-10-CM

## 2023-12-18 LAB — POC URINALSYSI DIPSTICK (AUTOMATED)
Bilirubin, UA: NEGATIVE
Blood, UA: NEGATIVE
Glucose, UA: NEGATIVE
Ketones, UA: NEGATIVE
Nitrite, UA: NEGATIVE
Protein, UA: POSITIVE — AB
Spec Grav, UA: 1.02 (ref 1.010–1.025)
Urobilinogen, UA: 0.2 U/dL
pH, UA: 6 (ref 5.0–8.0)

## 2023-12-18 MED ORDER — GEMTESA 75 MG PO TABS: 75.0000 mg | ORAL_TABLET | Freq: Every day | ORAL | 5 refills | Status: DC

## 2023-12-18 NOTE — Telephone Encounter (Signed)
 Testing will be done at appt today

## 2023-12-18 NOTE — Telephone Encounter (Signed)
 Copied from CRM [id number]. Topic: Clinical - Request for Lab/Test Order >> Dec 18, 2023 12:32 PM Viola F wrote: Reason for CRM: Patient request order for urine test to be put in before his appointment today at 4:15pm for possible UTI

## 2023-12-18 NOTE — Progress Notes (Signed)
 Established Patient Office Visit  Subjective   Patient ID: [name] Sosa, male    DOB: 1956/03/24  Age: 68 y.o. MRN: [id number]  Chief Complaint  Patient presents with   Dysuria    HPI   Seen today with concerns over some dysuria.  He relates almost 75-month history of some mild intermittent burning with urination and some frequency.  No cloudy urine.  No gross hematuria.  Also has significant urine urgency.  Has taken Myrbetriq  in the past but did not see much improvement.  He has significant nocturia recently getting up sometimes 4 or 5 times at night.  He feels like he does not have any obstruction.  No major slow stream.  Drinks 2 cups of coffee in the morning.  Tries to get most of his fluid consumption between 10 or 11 AM and 5 PM.  Past Medical History:  Diagnosis Date   CAD (coronary artery disease)    s/p 7 vessel CABG 2000 at [facility] // Echo 11/2018: EF 45-50, antero-apical and inferior akinesis // Myoview  11/2018: EF 51, distal ant/apical infarct and small inferior infarct with very mild peri-infarct ischemia; Low Risk   Carotid artery occlusion    High cholesterol    Hyperlipidemia    Hypertension    Pre-diabetes    < 6.1 2017   Stroke (HCC) 04/2016   TIA- 04/2016 double vision 2nd - speech - no residual   Trochlear neuropathy, 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   Past Surgical History:  Procedure Laterality Date   4v cabg     CARDIAC CATHETERIZATION     CARDIAC CATHETERIZATION N/A 03/23/2016   Procedure: Left Heart Cath and Cors/Grafts Angiography;  Surgeon: Lonni JONETTA Cash, MD;  Location: [facility] INVASIVE CV LAB;  Service: Cardiovascular;  Laterality: N/A;   CAROTID ANGIOGRAPHY N/A 10/11/2016   Procedure: Carotid Angiography / Cerebral angiogram;  Surgeon: Redell LITTIE Door, MD;  Location: [facility] INVASIVE CV LAB;  Service: Cardiovascular;  Laterality: N/A;   CAROTID ENDARTERECTOMY     COLONOSCOPY  2017   COLONOSCOPY W/ POLYPECTOMY     CORONARY ARTERY BYPASS GRAFT     ENDARTERECTOMY Left  10/16/2016   Procedure: LEFT CAROTID ENDARTERECTOMY WITH PATCH ANGIOPLASTY;  Surgeon: Redell LITTIE Door, MD;  Location: [facility] OR;  Service: Vascular;  Laterality: Left;   LEFT HEART CATH AND CORS/GRAFTS ANGIOGRAPHY N/A 01/19/2021   Procedure: LEFT HEART CATH AND CORS/GRAFTS ANGIOGRAPHY;  Surgeon: Cash Lonni JONETTA, MD;  Location: [facility] INVASIVE CV LAB;  Service: Cardiovascular;  Laterality: N/A;   patent grafts     s/p 7     UPPER GASTROINTESTINAL ENDOSCOPY      reports that he quit smoking about 25 years ago. His smoking use included cigarettes. He started smoking about 27 years ago. He has never used smokeless tobacco. He reports that he does not drink alcohol and does not use drugs. family history includes Colon cancer in his father; Heart disease in his father and mother; Heart failure in his father and mother; Hypertension in his brother, father, mother, and sister. Allergies  Allergen Reactions   Amlodipine  Itching    Chest pain  Chest pain     Chest pain  Chest pain   Chest pain   Lisinopril  Cough    Other reaction(s): Cough    Review of Systems  Constitutional:  Negative for chills and fever.  Cardiovascular:  Negative for chest pain.  Genitourinary:  Positive for dysuria, frequency and urgency. Negative for flank pain and hematuria.  Objective:     BP 120/64 (BP Location: Left Arm, Patient Position: Sitting, Cuff Size: Normal)   Pulse 72   Wt 139 lb 4.8 oz (63.2 kg)   SpO2 95%   BMI 21.18 kg/m  BP Readings from Last 3 Encounters:  12/18/23 120/64  12/10/23 (!) 110/56  12/03/23 (!) 94/57   Wt Readings from Last 3 Encounters:  12/18/23 139 lb 4.8 oz (63.2 kg)  12/10/23 139 lb 11.2 oz (63.4 kg)  12/03/23 138 lb (62.6 kg)      Physical Exam Vitals reviewed.  Constitutional:      General: He is not in acute distress.    Appearance: He is not ill-appearing.  Cardiovascular:     Rate and Rhythm: Normal rate and regular rhythm.  Pulmonary:     Effort: Pulmonary  effort is normal.     Breath sounds: Normal breath sounds. No wheezing or rales.  Musculoskeletal:     Right lower leg: No edema.     Left lower leg: No edema.  Neurological:     Mental Status: He is alert.      Results for orders placed or performed in visit on 12/18/23  POCT Urinalysis Dipstick (Automated)  Result Value Ref Range   Color, UA Yellow    Clarity, UA Clear    Glucose, UA Negative Negative   Bilirubin, UA Negative    Ketones, UA Negative    Spec Grav, UA 1.020 1.010 - 1.025   Blood, UA Negative    pH, UA 6.0 5.0 - 8.0   Protein, UA Positive (A) Negative   Urobilinogen, UA 0.2 0.2 or 1.0 E.U./dL   Nitrite, UA Negative    Leukocytes, UA Trace (A) Negative      The ASCVD Risk score (Arnett DK, et al., 2019) failed to calculate for the following reasons:   Risk score cannot be calculated because patient has a medical history suggesting prior/existing ASCVD    Assessment & Plan:   Problem List Items Addressed This Visit   None Visit Diagnoses       Dysuria    -  Primary   Relevant Orders   POCT Urinalysis Dipstick (Automated) (Completed)   Urine Culture     61-month history of some intermittent burning with urination.  Urine dipstick shows no blood and no nitrites.  Only trace leukocytes.  Doubt infection.  Urine culture sent.  Also reporting some urine urgency.  Has taken Myrbetriq  in the past but did not see much benefit.  Continue to minimize caffeine use.  Consider trial of Gemtesa 75 mg once daily if we can get this covered.  No follow-ups on file.    Wolm Scarlet, MD

## 2023-12-20 LAB — URINE CULTURE
MICRO NUMBER:: 16652289
SPECIMEN QUALITY:: ADEQUATE

## 2023-12-23 ENCOUNTER — Ambulatory Visit: Payer: Self-pay

## 2023-12-23 NOTE — Addendum Note (Signed)
 Addended by: METTA KRISTEN CROME on: 12/23/2023 08:37 AM   Modules accepted: Orders

## 2023-12-24 ENCOUNTER — Other Ambulatory Visit: Payer: Self-pay | Admitting: Family Medicine

## 2023-12-24 ENCOUNTER — Telehealth: Payer: Self-pay

## 2023-12-24 MED ORDER — CEPHALEXIN 500 MG PO CAPS
500.0000 mg | ORAL_CAPSULE | Freq: Three times a day (TID) | ORAL | 0 refills | Status: AC
Start: 2023-12-24 — End: 2023-12-31

## 2023-12-24 NOTE — Telephone Encounter (Signed)
 Copied from CRM [id number]. Topic: Clinical - Medication Question >> Dec 24, 2023  3:32 PM Burnard DEL wrote: Reason for CRM: Patient returned call to CMA Mykal regarding lab results.I relayed message to patient however I do not see that the antibiotic was sent to pharmacy for patient.   [facility] [place], [place] - [address]  Phone: [phone] Fax: [phone]

## 2023-12-24 NOTE — Addendum Note (Signed)
 Addended by: METTA KRISTEN CROME on: 12/24/2023 03:59 PM   Modules accepted: Orders

## 2023-12-24 NOTE — Telephone Encounter (Signed)
 Please see result note

## 2024-01-12 ENCOUNTER — Other Ambulatory Visit: Payer: Self-pay | Admitting: Nurse Practitioner

## 2024-02-19 DIAGNOSIS — H25813 Combined forms of age-related cataract, bilateral: Secondary | ICD-10-CM | POA: Diagnosis not present

## 2024-03-12 ENCOUNTER — Telehealth: Payer: Self-pay | Admitting: *Deleted

## 2024-03-12 NOTE — Telephone Encounter (Signed)
 Copied from CRM #[id number]. Topic: Clinical - Medication Prior Auth >> Mar 12, 2024  4:54 PM Drema MATSU wrote: Reason for CRM: Patient is needing a prior authorization for medication Vibegron  (GEMTESA ) 75 MG TABS. He stated that medication was changed from 30 days to 60 days.  Preferred pharmacy on file

## 2024-03-13 ENCOUNTER — Other Ambulatory Visit (HOSPITAL_COMMUNITY): Payer: Self-pay

## 2024-03-13 ENCOUNTER — Telehealth: Payer: Self-pay

## 2024-03-13 NOTE — Telephone Encounter (Signed)
 Pharmacy Patient Advocate Encounter   Received notification from Pt Calls Messages that prior authorization for Gemtesa  75 is required/requested.   Insurance verification completed.   The patient is insured through [facility] .   Per test claim: Refill too soon. PA is not needed at this time. Medication was filled 03/05/24. Next eligible fill date is 03/28/24. Patient can get 60 at that time

## 2024-03-13 NOTE — Telephone Encounter (Signed)
 Noted

## 2024-03-17 DIAGNOSIS — G43709 Chronic migraine without aura, not intractable, without status migrainosus: Secondary | ICD-10-CM | POA: Diagnosis not present

## 2024-05-02 ENCOUNTER — Other Ambulatory Visit: Payer: Self-pay | Admitting: Family Medicine

## 2024-05-21 ENCOUNTER — Encounter: Payer: Self-pay | Admitting: Cardiovascular Disease

## 2024-05-21 ENCOUNTER — Ambulatory Visit: Attending: Cardiovascular Disease | Admitting: Cardiovascular Disease

## 2024-05-21 VITALS — BP 104/60 | HR 71 | Ht 68.0 in | Wt 140.2 lb

## 2024-05-21 DIAGNOSIS — I6523 Occlusion and stenosis of bilateral carotid arteries: Secondary | ICD-10-CM

## 2024-05-21 DIAGNOSIS — I25118 Atherosclerotic heart disease of native coronary artery with other forms of angina pectoris: Secondary | ICD-10-CM

## 2024-05-21 DIAGNOSIS — I10 Essential (primary) hypertension: Secondary | ICD-10-CM

## 2024-05-21 DIAGNOSIS — E7800 Pure hypercholesterolemia, unspecified: Secondary | ICD-10-CM | POA: Diagnosis not present

## 2024-05-21 MED ORDER — CARVEDILOL 3.125 MG PO TABS: 3.1250 mg | ORAL_TABLET | Freq: Two times a day (BID) | ORAL | 3 refills | Status: AC

## 2024-05-21 NOTE — Progress Notes (Signed)
 Chief Complaint  Patient presents with   Follow-up    CAD   History of Present Illness: 68 yo male with history of CAD s/p 7V CABG in 2000 at [facility], chronic diastolic CHF, HTN, hyperlipidemia, carotid artery disease and prior TIA here today for cardiac follow up. He has chronic angina with cardiac caths in 2011 and 2017 with stable CAD. Most recent cardiac cath in August 2022 with 2/5 patent grafts (LIMA to LAD, free RIMA to Circumflex). The RCA is occluded and fills from left to right collaterals. LVEF=40-45%.  He underwent left carotid endarterectomy in May 2018 following a TIA. His carotid disease is followed in the vascular surgery office. Carotid dopplers May 2025 with moderate RICA stenosis and mild LICA stenosis. Echo 2023 with LVEF=55-60% with akinesis of the basal inferior wall, mild mitral regurgitation. Cardiac monitor June 2023 with sinus, PVCs. Seen for chest pain in January 2024 and Ranexa  increased to 1000 mg po BID. He had three syncopal events in April 2024 while urinating and was felt to have micturition syncope. He is now sitting down to urinate and has not had further syncope.   He is here today for follow up. He has been doing well overall. He had chest pain 5 days ago at night while sitting around. The pain was in the left side of his chest over a discreet spot and lasted for several hours. The pain resolved on it's own. No recurrence of chest pain. No dyspnea, palpitations, lower extremity edema, orthopnea, PND, dizziness, near syncope or syncope.   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    Past Surgical History:  Procedure Laterality Date   4v cabg     CARDIAC CATHETERIZATION     CARDIAC CATHETERIZATION N/A 03/23/2016   Procedure: Left Heart Cath and Cors/Grafts Angiography;  Surgeon: Lonni JONETTA Cash, MD;  Location: [facility] INVASIVE CV LAB;  Service: Cardiovascular;  Laterality: N/A;   CAROTID ANGIOGRAPHY N/A 10/11/2016   Procedure: Carotid Angiography / Cerebral angiogram;  Surgeon: Redell LITTIE Door, MD;  Location: [facility] INVASIVE CV LAB;  Service: Cardiovascular;  Laterality: N/A;   CAROTID ENDARTERECTOMY     COLONOSCOPY  2017   COLONOSCOPY W/ POLYPECTOMY     CORONARY ARTERY BYPASS GRAFT     ENDARTERECTOMY Left 10/16/2016   Procedure: LEFT CAROTID ENDARTERECTOMY WITH PATCH ANGIOPLASTY;  Surgeon: Redell LITTIE Door, MD;  Location: [facility] OR;  Service: Vascular;  Laterality: Left;   LEFT HEART CATH AND CORS/GRAFTS ANGIOGRAPHY N/A 01/19/2021   Procedure: LEFT HEART CATH AND CORS/GRAFTS ANGIOGRAPHY;  Surgeon: Cash Lonni JONETTA, MD;  Location: [facility] INVASIVE CV LAB;  Service: Cardiovascular;  Laterality: N/A;   patent grafts     s/p 7     UPPER GASTROINTESTINAL ENDOSCOPY      Current Outpatient Medications  Medication Sig Dispense Refill   ascorbic acid (VITAMIN C) 500 MG tablet Take 500 mg  by mouth daily.     aspirin  EC 81 MG tablet Take 1 tablet (81 mg total) by mouth daily. Swallow whole. 90 tablet 3   Evolocumab  (REPATHA  SURECLICK) 140 MG/ML SOAJ INJECT 140MG   SUBCUTANEOUSLY EVERY 14 DAYS 6 mL 0   ezetimibe  (ZETIA ) 10 MG tablet Take 1 tablet (10 mg total) by mouth daily. 90 tablet 3   Multiple Vitamin (MULTIVITAMIN) tablet Take 1 tablet by mouth daily.     nitroGLYCERIN  (NITROSTAT ) 0.4 MG SL tablet Place 1 tablet (0.4 mg total) under the tongue every 5 (five) minutes as needed for chest pain (up to 3 doses). 75 tablet 2   olmesartan   (BENICAR ) 5 MG tablet Take 2 tablets by mouth once daily 180 tablet 1   pantoprazole  (PROTONIX ) 40 MG tablet TAKE 1 TABLET BY MOUTH ONCE DAILY AS NEEDED 90 tablet 3   ranolazine  (RANEXA ) 1000 MG SR tablet Take 1 tablet by mouth twice daily 180 tablet 2   UBRELVY 100 MG TABS Take by mouth.     venlafaxine  XR (EFFEXOR -XR) 75 MG 24 hr capsule Take 75 mg by mouth daily.     Vibegron  (GEMTESA ) 75 MG TABS Take 1 tablet (75 mg total) by mouth daily. 30 tablet 5   VITAMIN C, CALCIUM  ASCORBATE, PO Take 250 mg by mouth daily.     carvedilol  (COREG ) 3.125 MG tablet Take 1 tablet (3.125 mg total) by mouth 2 (two) times daily with a meal. 180 tablet 3   gabapentin (NEURONTIN) 300 MG capsule Take 300 mg by mouth 2 (two) times daily. Pt takes 600 mg 2 capsule bid a day.     QULIPTA 30 MG TABS Take 1 tablet by mouth daily.     No current facility-administered medications for this visit.    Allergies  Allergen Reactions   Amlodipine  Itching    Chest pain  Chest pain     Chest pain  Chest pain   Chest pain   Lisinopril  Cough    Other reaction(s): Cough    Social History   Socioeconomic History   Marital status: Single    Spouse name: Not on file   Number of children: 3   Years of education: AS + 2   Highest education level: Associate degree: occupational, scientist, product/process development, or vocational program  Occupational History   Occupation: Statistician   Occupation: curator  Tobacco Use   Smoking status: Former    Current packs/day: 0.00    Types: Cigarettes    Start date: 06/19/1996    Quit date: 06/19/1998    Years since quitting: 25.9   Smokeless tobacco: Never  Vaping Use   Vaping status: Never Used  Substance and Sexual Activity   Alcohol use: No   Drug use: No   Sexual activity: Yes  Other Topics Concern   Not on file  Social History Narrative   Lives at home alone   Right-handed   Caffeine: 2 cups per day   Social Drivers of Health   Financial Resource Strain: Low Risk  (12/09/2023)    Overall Financial Resource Strain (CARDIA)    Difficulty of Paying Living Expenses: Not hard at all  Food Insecurity: No Food Insecurity (12/09/2023)   Hunger Vital Sign    Worried About Running Out of Food in the Last Year: Never true    Ran Out of Food in the Last Year: Never true  Transportation Needs: No Transportation Needs (12/09/2023)   PRAPARE - Administrator, Civil Service (Medical): No  Lack of Transportation (Non-Medical): No  Physical Activity: Sufficiently Active (12/09/2023)   Exercise Vital Sign    Days of Exercise per Week: 3 days    Minutes of Exercise per Session: 60 min  Stress: No Stress Concern Present (12/09/2023)   [employer] of Occupational Health - Occupational Stress Questionnaire    Feeling of Stress: Not at all  Social Connections: Unknown (12/09/2023)   Social Connection and Isolation Panel    Frequency of Communication with Friends and Family: More than three times a week    Frequency of Social Gatherings with Friends and Family: Twice a week    Attends Religious Services: Never    Database Administrator or Organizations: No    Attends Engineer, Structural: Not on file    Marital Status: Patient declined  Intimate Partner Violence: Unknown (09/21/2021)   Received from [facility]   HITS    Physically Hurt: Not on file    Insult or Talk Down To: Not on file    Threaten Physical Harm: Not on file    Scream or Curse: Not on file    Family History  Problem Relation Age of Onset   Heart failure Mother    Hypertension Mother    Heart disease Mother    Colon cancer Father    Heart failure Father    Hypertension Father    Heart disease Father        before age 36   Hypertension Sister    Hypertension Brother    Heart attack Neg Hx    Stroke Neg Hx    Esophageal cancer Neg Hx    Rectal cancer Neg Hx    Stomach cancer Neg Hx     Review of Systems:  As stated in the HPI and otherwise negative.   BP 104/60   Pulse 71    Ht 5' 8 (1.727 m)   Wt 140 lb 3.2 oz (63.6 kg)   SpO2 97%   BMI 21.32 kg/m   Physical Examination: General: Well developed, well nourished, NAD  HEENT: OP clear, mucus membranes moist  SKIN: warm, dry. No rashes. Neuro: No focal deficits  Musculoskeletal: Muscle strength 5/5 all ext  Psychiatric: Mood and affect normal  Neck: No JVD, no carotid bruits, no thyromegaly, no lymphadenopathy.  Lungs:Clear bilaterally, no wheezes, rhonci, crackles Cardiovascular: Regular rate and rhythm. No murmurs, gallops or rubs. Abdomen:Soft. Bowel sounds present. Non-tender.  Extremities: No lower extremity edema. Pulses are 2 + in the bilateral DP/PT.  EKG:  EKG is ordered today. The EKG demonstrates  EKG Interpretation Date/Time:  Thursday May 21 2024 16:14:14 EST Ventricular Rate:  66 PR Interval:  162 QRS Duration:  96 QT Interval:  410 QTC Calculation: 429 R Axis:   14  Text Interpretation: Normal sinus rhythm Inferior infarct (cited on or before 03-Dec-2004) Anterior infarct (cited on or before 23-Mar-2016) When compared with ECG of 18-Nov-2023 14:00, No significant change was found Confirmed by Verlin Bruckner [phone]) on 05/21/2024 4:16:37 PM   I also reviewed the EKG from June 2025: sinus, Non specific T wave abn  Recent Labs: 11/22/2023: ALT 27; BUN 17; Creatinine, Ser 1.07; Potassium 4.5; Sodium 138   Lipid Panel    Component Value Date/Time   CHOL 141 11/22/2023 0845   TRIG 76 11/22/2023 0845   HDL 57 11/22/2023 0845   CHOLHDL 2.5 11/22/2023 0845   CHOLHDL 1.8 01/04/2023 1054   VLDL 19.6 08/31/2022 1009   LDLCALC 69 11/22/2023  0845   LDLCALC 21 01/04/2023 1054     Wt Readings from Last 3 Encounters:  05/21/24 140 lb 3.2 oz (63.6 kg)  12/18/23 139 lb 4.8 oz (63.2 kg)  12/10/23 139 lb 11.2 oz (63.4 kg)    Assessment and Plan:   1. CAD s/p CABG with stable angina: He has chronic chest pain with no recent change. The episode of chest pain last weekend sounds  non-cardiac, likely related to GERD. EKG is unchanged today.  -Continue ASA, Coreg , Repatha  and Zetia .     2. HTN: BP is well controlled.  -Continue Coreg  and Benicar   3. HLD: LDL at goal in June 2025.  -Continue Repatha  and Zetia .      4. Carotid artery disease: s/p left CEA in 2018. Followed in [facility] by Dr. Magda.  Moderate RICA stenosis by carotid dopplers May 2025.   Labs/ tests ordered today include:   Orders Placed This Encounter  Procedures   EKG 12-Lead   Disposition:   F/U with me in one year.   Signed, Lonni Cash, MD 05/21/2024 4:28 PM    [facility] [address] [place], [place], [place]  [address] Phone: [phone]; Fax: [phone]

## 2024-05-21 NOTE — Patient Instructions (Signed)

## 2024-06-20 ENCOUNTER — Other Ambulatory Visit: Payer: Self-pay | Admitting: Family Medicine

## 2024-06-20 ENCOUNTER — Other Ambulatory Visit: Payer: Self-pay | Admitting: Cardiovascular Disease

## 2024-06-22 ENCOUNTER — Encounter: Payer: Self-pay | Admitting: Cardiovascular Disease

## 2024-06-23 MED ORDER — RANOLAZINE ER 1000 MG PO TB12: 1000.0000 mg | ORAL_TABLET | Freq: Two times a day (BID) | ORAL | 2 refills | Status: AC

## 2024-06-29 ENCOUNTER — Other Ambulatory Visit: Payer: Self-pay | Admitting: Family Medicine
